# Patient Record
Sex: Female | Born: 1955
Health system: Southern US, Community
[De-identification: ages and names within clinical notes are randomized; demographics above are authoritative.]

## PROBLEM LIST (undated history)

## (undated) DIAGNOSIS — T7840XA Allergy, unspecified, initial encounter: Secondary | ICD-10-CM

## (undated) DIAGNOSIS — Z8489 Family history of other specified conditions: Secondary | ICD-10-CM

## (undated) DIAGNOSIS — C8330 Diffuse large B-cell lymphoma, unspecified site: Secondary | ICD-10-CM

## (undated) DIAGNOSIS — Z86018 Personal history of other benign neoplasm: Secondary | ICD-10-CM

## (undated) DIAGNOSIS — M1990 Unspecified osteoarthritis, unspecified site: Secondary | ICD-10-CM

## (undated) DIAGNOSIS — Z9221 Personal history of antineoplastic chemotherapy: Secondary | ICD-10-CM

## (undated) DIAGNOSIS — Z1371 Encounter for nonprocreative screening for genetic disease carrier status: Secondary | ICD-10-CM

## (undated) DIAGNOSIS — Z85828 Personal history of other malignant neoplasm of skin: Secondary | ICD-10-CM

## (undated) DIAGNOSIS — C50919 Malignant neoplasm of unspecified site of unspecified female breast: Secondary | ICD-10-CM

## (undated) DIAGNOSIS — C4490 Unspecified malignant neoplasm of skin, unspecified: Secondary | ICD-10-CM

## (undated) DIAGNOSIS — Z923 Personal history of irradiation: Secondary | ICD-10-CM

## (undated) DIAGNOSIS — R011 Cardiac murmur, unspecified: Secondary | ICD-10-CM

## (undated) DIAGNOSIS — IMO0002 Reserved for concepts with insufficient information to code with codable children: Secondary | ICD-10-CM

## (undated) DIAGNOSIS — R112 Nausea with vomiting, unspecified: Secondary | ICD-10-CM

## (undated) DIAGNOSIS — Z9889 Other specified postprocedural states: Secondary | ICD-10-CM

## (undated) HISTORY — PX: APPENDECTOMY: SHX54

## (undated) HISTORY — DX: Personal history of other malignant neoplasm of skin: Z85.828

## (undated) HISTORY — DX: Personal history of other benign neoplasm: Z86.018

## (undated) HISTORY — PX: JOINT REPLACEMENT: SHX530

## (undated) HISTORY — DX: Diffuse large B-cell lymphoma, unspecified site: C83.30

## (undated) HISTORY — DX: Encounter for nonprocreative screening for genetic disease carrier status: Z13.71

## (undated) HISTORY — DX: Allergy, unspecified, initial encounter: T78.40XA

---
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
## 1982-12-30 HISTORY — PX: CHOLECYSTECTOMY: SHX55

## 2000-12-30 HISTORY — PX: BREAST CYST ASPIRATION: SHX578

## 2002-12-30 HISTORY — PX: DILATION AND CURETTAGE OF UTERUS: SHX78

## 2003-12-31 HISTORY — PX: ENDOMETRIAL ABLATION: SHX621

## 2005-11-13 ENCOUNTER — Ambulatory Visit: Payer: Self-pay

## 2006-04-24 ENCOUNTER — Ambulatory Visit: Payer: Self-pay | Admitting: Family Medicine

## 2007-01-07 ENCOUNTER — Ambulatory Visit: Payer: Self-pay

## 2007-09-09 ENCOUNTER — Ambulatory Visit: Payer: Self-pay | Admitting: Family Medicine

## 2007-10-06 ENCOUNTER — Ambulatory Visit: Payer: Self-pay | Admitting: Gastroenterology

## 2007-10-06 LAB — HM COLONOSCOPY: HM Colonoscopy: NORMAL

## 2007-12-31 DIAGNOSIS — C50919 Malignant neoplasm of unspecified site of unspecified female breast: Secondary | ICD-10-CM

## 2007-12-31 DIAGNOSIS — Z923 Personal history of irradiation: Secondary | ICD-10-CM

## 2007-12-31 DIAGNOSIS — IMO0001 Reserved for inherently not codable concepts without codable children: Secondary | ICD-10-CM

## 2007-12-31 HISTORY — DX: Reserved for inherently not codable concepts without codable children: IMO0001

## 2007-12-31 HISTORY — PX: BREAST LUMPECTOMY: SHX2

## 2007-12-31 HISTORY — DX: Personal history of irradiation: Z92.3

## 2007-12-31 HISTORY — PX: BREAST EXCISIONAL BIOPSY: SUR124

## 2007-12-31 HISTORY — DX: Malignant neoplasm of unspecified site of unspecified female breast: C50.919

## 2008-05-03 ENCOUNTER — Ambulatory Visit: Payer: Self-pay

## 2008-05-05 ENCOUNTER — Ambulatory Visit: Payer: Self-pay

## 2008-05-30 ENCOUNTER — Ambulatory Visit: Payer: Self-pay | Admitting: Radiation Oncology

## 2008-05-31 ENCOUNTER — Ambulatory Visit: Payer: Self-pay | Admitting: General Surgery

## 2008-06-06 ENCOUNTER — Ambulatory Visit: Payer: Self-pay | Admitting: General Surgery

## 2008-06-16 ENCOUNTER — Ambulatory Visit: Payer: Self-pay | Admitting: Radiation Oncology

## 2008-06-29 ENCOUNTER — Ambulatory Visit: Payer: Self-pay | Admitting: Radiation Oncology

## 2008-06-29 HISTORY — PX: BREAST SURGERY: SHX581

## 2008-07-30 ENCOUNTER — Ambulatory Visit: Payer: Self-pay | Admitting: Radiation Oncology

## 2008-08-30 ENCOUNTER — Ambulatory Visit: Payer: Self-pay | Admitting: Radiation Oncology

## 2008-10-30 ENCOUNTER — Ambulatory Visit: Payer: Self-pay | Admitting: Internal Medicine

## 2008-11-11 ENCOUNTER — Ambulatory Visit: Payer: Self-pay | Admitting: Internal Medicine

## 2008-11-29 ENCOUNTER — Ambulatory Visit: Payer: Self-pay | Admitting: Internal Medicine

## 2008-12-05 ENCOUNTER — Ambulatory Visit: Payer: Self-pay | Admitting: General Surgery

## 2009-01-30 ENCOUNTER — Ambulatory Visit: Payer: Self-pay | Admitting: Internal Medicine

## 2009-02-03 ENCOUNTER — Ambulatory Visit: Payer: Self-pay | Admitting: Internal Medicine

## 2009-02-27 ENCOUNTER — Ambulatory Visit: Payer: Self-pay | Admitting: Internal Medicine

## 2009-04-04 DIAGNOSIS — K589 Irritable bowel syndrome without diarrhea: Secondary | ICD-10-CM | POA: Insufficient documentation

## 2009-04-04 DIAGNOSIS — E785 Hyperlipidemia, unspecified: Secondary | ICD-10-CM | POA: Insufficient documentation

## 2009-04-05 DIAGNOSIS — Z853 Personal history of malignant neoplasm of breast: Secondary | ICD-10-CM | POA: Insufficient documentation

## 2009-04-05 DIAGNOSIS — Z8249 Family history of ischemic heart disease and other diseases of the circulatory system: Secondary | ICD-10-CM | POA: Insufficient documentation

## 2009-04-28 ENCOUNTER — Ambulatory Visit: Payer: Self-pay | Admitting: Internal Medicine

## 2009-04-29 ENCOUNTER — Ambulatory Visit: Payer: Self-pay | Admitting: Internal Medicine

## 2009-05-18 DIAGNOSIS — E559 Vitamin D deficiency, unspecified: Secondary | ICD-10-CM | POA: Insufficient documentation

## 2009-06-12 ENCOUNTER — Ambulatory Visit: Payer: Self-pay | Admitting: General Surgery

## 2009-06-22 DIAGNOSIS — E781 Pure hyperglyceridemia: Secondary | ICD-10-CM | POA: Insufficient documentation

## 2009-06-29 ENCOUNTER — Ambulatory Visit: Payer: Self-pay | Admitting: Internal Medicine

## 2009-07-21 ENCOUNTER — Ambulatory Visit: Payer: Self-pay | Admitting: Internal Medicine

## 2009-07-30 ENCOUNTER — Ambulatory Visit: Payer: Self-pay | Admitting: Internal Medicine

## 2009-09-29 ENCOUNTER — Ambulatory Visit: Payer: Self-pay | Admitting: Internal Medicine

## 2009-10-13 ENCOUNTER — Ambulatory Visit: Payer: Self-pay | Admitting: Internal Medicine

## 2009-10-30 ENCOUNTER — Ambulatory Visit: Payer: Self-pay | Admitting: Internal Medicine

## 2009-11-29 ENCOUNTER — Ambulatory Visit: Payer: Self-pay | Admitting: General Surgery

## 2010-01-30 ENCOUNTER — Ambulatory Visit: Payer: Self-pay | Admitting: Internal Medicine

## 2010-02-02 ENCOUNTER — Ambulatory Visit: Payer: Self-pay | Admitting: Internal Medicine

## 2010-02-27 ENCOUNTER — Ambulatory Visit: Payer: Self-pay | Admitting: Internal Medicine

## 2010-03-30 ENCOUNTER — Ambulatory Visit: Payer: Self-pay | Admitting: Internal Medicine

## 2010-04-27 ENCOUNTER — Ambulatory Visit: Payer: Self-pay | Admitting: Internal Medicine

## 2010-04-29 ENCOUNTER — Ambulatory Visit: Payer: Self-pay | Admitting: Internal Medicine

## 2010-06-18 ENCOUNTER — Ambulatory Visit: Payer: Self-pay | Admitting: General Surgery

## 2010-07-25 ENCOUNTER — Ambulatory Visit: Payer: Self-pay | Admitting: Internal Medicine

## 2010-07-30 ENCOUNTER — Ambulatory Visit: Payer: Self-pay | Admitting: Internal Medicine

## 2010-08-30 ENCOUNTER — Ambulatory Visit: Payer: Self-pay | Admitting: Internal Medicine

## 2010-12-19 ENCOUNTER — Ambulatory Visit: Payer: Self-pay | Admitting: General Surgery

## 2010-12-19 ENCOUNTER — Ambulatory Visit: Payer: Self-pay | Admitting: Internal Medicine

## 2010-12-30 ENCOUNTER — Ambulatory Visit: Payer: Self-pay | Admitting: Internal Medicine

## 2011-02-11 DIAGNOSIS — Z85828 Personal history of other malignant neoplasm of skin: Secondary | ICD-10-CM

## 2011-02-11 HISTORY — DX: Personal history of other malignant neoplasm of skin: Z85.828

## 2011-06-20 DIAGNOSIS — Z86018 Personal history of other benign neoplasm: Secondary | ICD-10-CM

## 2011-06-20 HISTORY — DX: Personal history of other benign neoplasm: Z86.018

## 2011-06-21 ENCOUNTER — Ambulatory Visit: Payer: Self-pay | Admitting: Internal Medicine

## 2011-06-30 ENCOUNTER — Ambulatory Visit: Payer: Self-pay | Admitting: Internal Medicine

## 2011-08-07 ENCOUNTER — Ambulatory Visit: Payer: Self-pay | Admitting: Internal Medicine

## 2011-09-10 ENCOUNTER — Ambulatory Visit: Payer: Self-pay | Admitting: Family Medicine

## 2012-08-20 ENCOUNTER — Ambulatory Visit: Payer: Self-pay | Admitting: Internal Medicine

## 2012-08-21 ENCOUNTER — Ambulatory Visit: Payer: Self-pay | Admitting: Internal Medicine

## 2012-08-21 LAB — CREATININE, SERUM
EGFR (African American): >60
EGFR (Non-African Amer.): >60

## 2012-08-21 LAB — CBC CANCER CENTER
Basophil #: 0 x10 3/mm (ref 0.0–0.1)
Basophil %: 0.4 %
HCT: 42.3 % (ref 35.0–47.0)
HGB: 14.2 g/dL (ref 12.0–16.0)
MCH: 29.4 pg (ref 26.0–34.0)
MCHC: 33.6 g/dL (ref 32.0–36.0)
MCV: 88 fL (ref 80–100)
Monocyte #: 0.5 x10 3/mm (ref 0.2–0.9)
Neutrophil #: 3.7 x10 3/mm (ref 1.4–6.5)
RDW: 13.2 % (ref 11.5–14.5)

## 2012-08-21 LAB — HEPATIC FUNCTION PANEL A (ARMC)
Albumin: 3.7 g/dL (ref 3.4–5.0)
Alkaline Phosphatase: 106 U/L (ref 50–136)
Bilirubin,Total: 0.6 mg/dL (ref 0.2–1.0)
SGOT(AST): 20 U/L (ref 15–37)
SGPT (ALT): 26 U/L (ref 12–78)

## 2012-08-30 ENCOUNTER — Ambulatory Visit: Payer: Self-pay | Admitting: Internal Medicine

## 2012-12-11 LAB — HM PAP SMEAR: HM Pap smear: NEGATIVE

## 2013-02-18 ENCOUNTER — Ambulatory Visit: Payer: Self-pay | Admitting: Internal Medicine

## 2013-08-18 LAB — HM DEXA SCAN

## 2013-08-23 ENCOUNTER — Ambulatory Visit: Payer: Self-pay | Admitting: Internal Medicine

## 2013-09-06 ENCOUNTER — Ambulatory Visit: Payer: Self-pay | Admitting: Internal Medicine

## 2013-09-06 LAB — CBC CANCER CENTER
Basophil %: 0.7 %
Eosinophil %: 1.2 %
HCT: 43.5 % (ref 35.0–47.0)
Lymphocyte %: 28.9 %
MCH: 29.8 pg (ref 26.0–34.0)
MCHC: 34.6 g/dL (ref 32.0–36.0)
MCV: 86 fL (ref 80–100)
Monocyte #: 0.6 x10 3/mm (ref 0.2–0.9)
Neutrophil #: 5 x10 3/mm (ref 1.4–6.5)
Neutrophil %: 61.4 %
Platelet: 196 x10 3/mm (ref 150–440)
RBC: 5.05 10*6/uL (ref 3.80–5.20)

## 2013-09-06 LAB — HEPATIC FUNCTION PANEL A (ARMC)
Albumin: 3.9 g/dL (ref 3.4–5.0)
Alkaline Phosphatase: 137 U/L — ABNORMAL HIGH (ref 50–136)
Bilirubin,Total: 0.3 mg/dL (ref 0.2–1.0)
SGOT(AST): 21 U/L (ref 15–37)

## 2013-09-06 LAB — CREATININE, SERUM
Creatinine: 0.8 mg/dL (ref 0.60–1.30)
EGFR (African American): >60
EGFR (Non-African Amer.): >60

## 2013-09-29 ENCOUNTER — Ambulatory Visit: Payer: Self-pay | Admitting: Internal Medicine

## 2013-12-30 HISTORY — PX: TUMOR REMOVAL: SHX12

## 2014-01-20 ENCOUNTER — Ambulatory Visit: Payer: Self-pay | Admitting: Obstetrics and Gynecology

## 2014-01-28 ENCOUNTER — Ambulatory Visit: Payer: Self-pay | Admitting: Obstetrics and Gynecology

## 2014-02-01 ENCOUNTER — Ambulatory Visit: Payer: Self-pay | Admitting: Family Medicine

## 2014-02-02 LAB — PATHOLOGY REPORT

## 2014-03-01 ENCOUNTER — Ambulatory Visit: Payer: Self-pay | Admitting: Obstetrics and Gynecology

## 2014-12-14 ENCOUNTER — Ambulatory Visit: Payer: Self-pay | Admitting: Family Medicine

## 2014-12-14 LAB — HM MAMMOGRAPHY: HM Mammogram: NEGATIVE

## 2015-01-11 LAB — HEMOGLOBIN A1C: HEMOGLOBIN A1C: 5.4

## 2015-01-11 LAB — HEPATIC FUNCTION PANEL
ALT: 21 U/L (ref 7–35)
AST: 15 U/L (ref 13–35)

## 2015-01-11 LAB — BASIC METABOLIC PANEL
BUN: 8 mg/dL (ref 4–21)
Creatinine: 0.7 mg/dL (ref 0.5–1.1)
GLUCOSE: 96 mg/dL
Potassium: 4.6 mmol/L (ref 3.4–5.3)
SODIUM: 141 mmol/L (ref 137–147)

## 2015-01-11 LAB — LIPID PANEL
Cholesterol: 207 mg/dL — AB (ref 0–200)
HDL: 45 mg/dL (ref 35–70)
LDL CALC: 116 mg/dL
Triglycerides: 228 mg/dL — AB (ref 40–160)

## 2015-01-11 LAB — CBC AND DIFFERENTIAL
HCT: 43 % (ref 36–46)
Hemoglobin: 14.5 g/dL (ref 12.0–16.0)
Platelets: 212 10*3/uL (ref 150–399)
WBC: 6.8 10*3/mL

## 2015-01-11 LAB — TSH: TSH: 4.64 u[IU]/mL (ref 0.41–5.90)

## 2015-04-22 NOTE — Op Note (Signed)
PATIENT NAME:  Victoria Benson, Victoria Benson MR#:  [id number] DATE OF BIRTH:  20-Oct-1956  DATE OF PROCEDURE:  01/28/2014  PREOPERATIVE DIAGNOSIS: Intracavitary defect on saline infusion sonogram and postmenopausal bleeding.   POSTOPERATIVE DIAGNOSIS: Submucosal myoma.   OPERATION PERFORMED: Operative hysteroscopy.   PRIMARY SURGEON: Malachy Mood, MD  ANESTHESIA: General.   ESTIMATED BLOOD LOSS: 10 mL.  OPERATIVE FLUIDS: One liter of crystalloid, 10 mL of urine output.   PREOPERATIVE ANTIBIOTICS: None.   COMPLICATIONS: None.   FINDINGS: A 2 to 3 cm submucosal pedunculated myoma, cavity consistent with a unicornuate uterus.   SPECIMENS REMOVED: Submucosal myoma morcellated.  CONDITION FOLLOWING PROCEDURE: Stable.   PROCEDURE IN DETAIL: Risks, benefits, and alternatives of the procedure were discussed with the patient prior to proceeding to the operating room. The patient was taken to the operating room where she was placed under general endotracheal anesthesia. She was positioned in the dorsal lithotomy position using Allen stirrups, prepped and draped in the usual sterile fashion. A timeout procedure was performed. Attention was turned to the patient's pelvis. A red rubber catheter was used to straight cath the bladder. An operative speculum was then placed. The cervix was visualized, grasped with a single-tooth tenaculum and sequentially dilated using Pratt dilators. The hysteroscope was advanced into the cavity noting the above findings. A MyoSure device was then used to remove the submucosal myoma. Following resection of the submucosal myoma, the resection site was noted to be hemostatic. There were no other defects noted in the cavity. The hysteroscope was removed. The single-tooth tenaculum was removed as well, and the tenaculum sites were noted to be hemostatic. Sponge, needle, and instrument counts were correct x2. The patient tolerated the procedure well and was taken to the recovery room in  stable condition. ____________________________ Stoney Bang. Georgianne Fick, MD ams:sb D: 02/01/2014 07:27:23 ET T: 02/01/2014 09:43:06 ET JOB#: [id number]  cc: Stoney Bang. Georgianne Fick, MD, <Dictator> Conan Bowens Madelon Lips MD ELECTRONICALLY SIGNED 02/16/2014 0:59

## 2015-12-31 HISTORY — PX: BREAST LUMPECTOMY: SHX2

## 2016-01-04 DIAGNOSIS — J309 Allergic rhinitis, unspecified: Secondary | ICD-10-CM | POA: Insufficient documentation

## 2016-01-04 DIAGNOSIS — I83813 Varicose veins of bilateral lower extremities with pain: Secondary | ICD-10-CM | POA: Insufficient documentation

## 2016-01-04 DIAGNOSIS — R319 Hematuria, unspecified: Secondary | ICD-10-CM | POA: Insufficient documentation

## 2016-01-08 ENCOUNTER — Encounter: Payer: Self-pay | Admitting: Family Medicine

## 2016-03-20 ENCOUNTER — Ambulatory Visit (INDEPENDENT_AMBULATORY_CARE_PROVIDER_SITE_OTHER): Payer: 59 | Admitting: Family Medicine

## 2016-03-20 ENCOUNTER — Encounter: Payer: Self-pay | Admitting: Family Medicine

## 2016-03-20 VITALS — BP 110/62 | HR 76 | Temp 98.1°F | Resp 16 | Ht 62.0 in | Wt 211.0 lb

## 2016-03-20 DIAGNOSIS — Z1211 Encounter for screening for malignant neoplasm of colon: Secondary | ICD-10-CM | POA: Diagnosis not present

## 2016-03-20 DIAGNOSIS — E785 Hyperlipidemia, unspecified: Secondary | ICD-10-CM

## 2016-03-20 DIAGNOSIS — J3089 Other allergic rhinitis: Secondary | ICD-10-CM | POA: Diagnosis not present

## 2016-03-20 DIAGNOSIS — E039 Hypothyroidism, unspecified: Secondary | ICD-10-CM

## 2016-03-20 DIAGNOSIS — Z1239 Encounter for other screening for malignant neoplasm of breast: Secondary | ICD-10-CM

## 2016-03-20 DIAGNOSIS — E038 Other specified hypothyroidism: Secondary | ICD-10-CM

## 2016-03-20 DIAGNOSIS — Z0000 Encounter for general adult medical examination without abnormal findings: Secondary | ICD-10-CM | POA: Diagnosis not present

## 2016-03-20 LAB — POCT URINALYSIS DIPSTICK
BILIRUBIN UA: NEGATIVE
Blood, UA: NEGATIVE
GLUCOSE UA: NEGATIVE
Ketones, UA: NEGATIVE
LEUKOCYTES UA: NEGATIVE
NITRITE UA: NEGATIVE
Protein, UA: NEGATIVE
Spec Grav, UA: <=1.005
Urobilinogen, UA: 0.2
pH, UA: 6.5

## 2016-03-20 LAB — IFOBT (OCCULT BLOOD): IMMUNOLOGICAL FECAL OCCULT BLOOD TEST: NEGATIVE

## 2016-03-20 NOTE — Patient Instructions (Signed)
Please call the [facility] at [facility] to schedule this at [phone]   

## 2016-03-20 NOTE — Progress Notes (Signed)
Patient ID: Victoria Benson, female   DOB: 1956/08/20, 60 y.o.   MRN: [id number]       Patient: Victoria Benson, Female    DOB: 06/16/1956, 60 y.o.   MRN: [id number] Visit Date: 03/20/2016  Today's Provider: Margarita Rana, MD   Chief Complaint  Patient presents with  . Annual Exam   Subjective:    Annual wellness visit Victoria Benson is a 60 y.o. female. She feels well. She reports exercising none. She reports she is sleeping fairly well. 01/05/15 CPE 12/11/12 Pap-neg; HPV-neg, pt reports a pap done at [facility] in 2014 12/14/14 Mammogram-BI-RADS 2 10/06/07 Colonoscopy-normal, recheck 10 yrs -----------------------------------------------------------    Review of Systems  Constitutional: Negative.   HENT: Positive for postnasal drip, sinus pressure and tinnitus.   Eyes: Negative.   Respiratory: Negative.   Cardiovascular: Negative.   Gastrointestinal: Negative.   Endocrine: Negative.   Genitourinary: Negative.   Musculoskeletal: Negative.   Skin: Negative.   Allergic/Immunologic: Negative.   Neurological: Negative.   Hematological: Negative.   Psychiatric/Behavioral: Negative.     Social History   Social History  . Marital Status: Married    Spouse Name: N/A  . Number of Children: N/A  . Years of Education: N/A   Occupational History  . Not on file.   Social History Main Topics  . Smoking status: Never Smoker   . Smokeless tobacco: Never Used  . Alcohol Use: No  . Drug Use: No  . Sexual Activity: Not on file   Other Topics Concern  . Not on file   Social History Narrative    History reviewed. No pertinent past medical history.   Patient Active Problem List   Diagnosis Date Noted  . Subclinical hypothyroidism 03/20/2016  . Allergic rhinitis 01/04/2016  . Blood in the urine 01/04/2016  . Phlebectasia 01/04/2016  . Hyperglyceridemia, pure 06/22/2009  . Avitaminosis D 05/18/2009  . Fam hx-ischem heart disease 04/05/2009  . H/O malignant  neoplasm of breast 04/05/2009  . HLD (hyperlipidemia) 04/04/2009  . Adaptive colitis 04/04/2009    Past Surgical History  Procedure Laterality Date  . Cholecystectomy  1984  . Endometrial ablation  2005  . Breast surgery Right July 2009    lumpectomy- CA  . Dilation and curettage of uterus  2004  . Tumor removal  12/2013    from uterus done by [facility]    Her family history includes CAD in her mother; Diabetes in her father and paternal grandfather; Fibromyalgia in her brother; Healthy in her brother; Heart attack in her paternal grandmother; Hypertension in her brother, brother, and brother; Lung cancer in her father; Osteoporosis in her mother; Ovarian cancer in her maternal grandmother.    Previous Medications   CALCIUM-MAGNESIUM-VITAMIN D ER PO    Take 1 tablet by mouth daily.   LORATADINE (CLARITIN) 10 MG TABLET    Take 10 mg by mouth daily.    OMEGA-3 KRILL OIL 500 MG CAPS    Take 750 mg by mouth daily.     Patient Care Team: Margarita Rana, MD as PCP - General (Family Medicine)     Objective:   Vitals: BP 110/62 mmHg  Pulse 76  Temp(Src) 98.1 F (36.7 C) (Oral)  Resp 16  Ht 5\' 2"  (1.575 m)  Wt 211 lb (95.709 kg)  BMI 38.58 kg/m2  Physical Exam  Constitutional: She is oriented to person, place, and time. She appears well-developed and well-nourished.  HENT:  Head: Normocephalic and atraumatic.  Right  Ear: Tympanic membrane, external ear and ear canal normal.  Left Ear: Tympanic membrane, external ear and ear canal normal.  Nose: Nose normal.  Mouth/Throat: Uvula is midline, oropharynx is clear and moist and mucous membranes are normal.  Eyes: Conjunctivae, EOM and lids are normal. Pupils are equal, round, and reactive to light.  Neck: Trachea normal and normal range of motion. Neck supple. Carotid bruit is not present. No thyroid mass and no thyromegaly present.  Cardiovascular: Normal rate, regular rhythm and normal heart sounds.   Pulmonary/Chest: Effort  normal and breath sounds normal.  Post surgical scar on right breast  Abdominal: Soft. Normal appearance and bowel sounds are normal. There is no hepatosplenomegaly. There is no tenderness.  Genitourinary: Uterus normal. No breast swelling, tenderness or discharge.  Musculoskeletal: Normal range of motion.  Lymphadenopathy:    She has no cervical adenopathy.    She has no axillary adenopathy.  Neurological: She is alert and oriented to person, place, and time. She has normal strength. No cranial nerve deficit.  Skin: Skin is warm, dry and intact.  Psychiatric: She has a normal mood and affect. Her speech is normal and behavior is normal. Judgment and thought content normal. Cognition and memory are normal.    Activities of Daily Living In your present state of health, do you have any difficulty performing the following activities: 03/20/2016  Hearing? Y  Vision? N  Difficulty concentrating or making decisions? N  Walking or climbing stairs? N  Dressing or bathing? N  Doing errands, shopping? N    Fall Risk Assessment Fall Risk  03/20/2016  Falls in the past year? No     Depression Screen PHQ 2/9 Scores 03/20/2016  PHQ - 2 Score 0     Assessment & Plan:     Annual Wellness Visit  Reviewed patient's Family Medical History Reviewed and updated list of patient's medical providers Assessment of cognitive impairment was done Assessed patient's functional ability Established a written schedule for health screening [place] Completed and Reviewed  Exercise Activities and Dietary recommendations Goals    None      Immunization History  Administered Date(s) Administered  . Influenza-Unspecified 12/04/2015  . Td 07/10/1999  . Tdap 04/12/2009       1. Annual physical exam Stable. Patient advised to continue eating healthy and exercise daily. - POCT urinalysis dipstick  2. Breast cancer screening - MM DIGITAL SCREENING BILATERAL; Future  3.  Colon cancer screening - IFOBT POC (occult bld, rslt in office)  4. HLD (hyperlipidemia) - Comprehensive metabolic panel - Lipid Panel With LDL/HDL Ratio  5. Other allergic rhinitis - CBC with Differential/Platelet  6. Subclinical hypothyroidism - TSH   Patient seen and examined by Dr. Jerrell Belfast, and note scribed by Philbert Riser. Dimas, CMA.   I have reviewed the document for accuracy and completeness and I agree with above. Jerrell Belfast, MD   Margarita Rana, MD   ------------------------------------------------------------------------------------------------------------

## 2016-03-28 ENCOUNTER — Telehealth: Payer: Self-pay

## 2016-03-28 LAB — LIPID PANEL WITH LDL/HDL RATIO
CHOLESTEROL TOTAL: 221 mg/dL — AB (ref 100–199)
HDL: 45 mg/dL (ref >39)
LDL Calculated: 124 mg/dL — ABNORMAL HIGH (ref 0–99)
LDl/HDL Ratio: 2.8 ratio units (ref 0.0–3.2)
Triglycerides: 262 mg/dL — ABNORMAL HIGH (ref 0–149)
VLDL Cholesterol Cal: 52 mg/dL — ABNORMAL HIGH (ref 5–40)

## 2016-03-28 LAB — COMPREHENSIVE METABOLIC PANEL
A/G RATIO: 1.9 (ref 1.2–2.2)
ALBUMIN: 4.4 g/dL (ref 3.5–5.5)
ALK PHOS: 102 IU/L (ref 39–117)
ALT: 23 IU/L (ref 0–32)
AST: 17 IU/L (ref 0–40)
BILIRUBIN TOTAL: 0.6 mg/dL (ref 0.0–1.2)
BUN / CREAT RATIO: 13 (ref 9–23)
BUN: 9 mg/dL (ref 6–24)
CHLORIDE: 103 mmol/L (ref 96–106)
CO2: 28 mmol/L (ref 18–29)
Calcium: 9.3 mg/dL (ref 8.7–10.2)
Creatinine, Ser: 0.68 mg/dL (ref 0.57–1.00)
GFR calc non Af Amer: 97 mL/min/{1.73_m2} (ref >59)
GFR, EST AFRICAN AMERICAN: 112 mL/min/{1.73_m2} (ref >59)
GLUCOSE: 93 mg/dL (ref 65–99)
Globulin, Total: 2.3 g/dL (ref 1.5–4.5)
POTASSIUM: 4.5 mmol/L (ref 3.5–5.2)
SODIUM: 145 mmol/L — AB (ref 134–144)
TOTAL PROTEIN: 6.7 g/dL (ref 6.0–8.5)

## 2016-03-28 LAB — CBC WITH DIFFERENTIAL/PLATELET
BASOS ABS: 0 10*3/uL (ref 0.0–0.2)
Basos: 0 %
EOS (ABSOLUTE): 0.1 10*3/uL (ref 0.0–0.4)
Eos: 2 %
Hematocrit: 42.5 % (ref 34.0–46.6)
Hemoglobin: 14 g/dL (ref 11.1–15.9)
IMMATURE GRANS (ABS): 0 10*3/uL (ref 0.0–0.1)
IMMATURE GRANULOCYTES: 0 %
LYMPHS: 30 %
Lymphocytes Absolute: 2.1 10*3/uL (ref 0.7–3.1)
MCH: 28.8 pg (ref 26.6–33.0)
MCHC: 32.9 g/dL (ref 31.5–35.7)
MCV: 87 fL (ref 79–97)
Monocytes Absolute: 0.4 10*3/uL (ref 0.1–0.9)
Monocytes: 6 %
NEUTROS ABS: 4.3 10*3/uL (ref 1.4–7.0)
NEUTROS PCT: 62 %
PLATELETS: 245 10*3/uL (ref 150–379)
RBC: 4.86 x10E6/uL (ref 3.77–5.28)
RDW: 13.8 % (ref 12.3–15.4)
WBC: 7 10*3/uL (ref 3.4–10.8)

## 2016-03-28 LAB — TSH: TSH: 3.43 u[IU]/mL (ref 0.450–4.500)

## 2016-03-28 NOTE — Telephone Encounter (Signed)
-----   Message from Margarita Rana, MD sent at 03/28/2016 11:04 AM EDT ----- Labs stable. Cholesterol is elevated at 221 but does have high good cholesterol and 10 year risk of heart disease is only 3 percent.  Recommend eat healthy and exercise and recheck annually. Thanks.

## 2016-03-28 NOTE — Telephone Encounter (Signed)
Advised pt of lab results. Pt verbally acknowledges understanding. Estephany Perot Drozdowski, CMA   

## 2016-05-08 ENCOUNTER — Encounter: Payer: Self-pay | Admitting: Physician Assistant

## 2016-05-08 ENCOUNTER — Ambulatory Visit (INDEPENDENT_AMBULATORY_CARE_PROVIDER_SITE_OTHER): Payer: 59 | Admitting: Physician Assistant

## 2016-05-08 VITALS — BP 122/68 | HR 117 | Temp 99.9°F | Resp 16 | Wt 204.6 lb

## 2016-05-08 DIAGNOSIS — J029 Acute pharyngitis, unspecified: Secondary | ICD-10-CM

## 2016-05-08 DIAGNOSIS — J020 Streptococcal pharyngitis: Secondary | ICD-10-CM

## 2016-05-08 LAB — POCT RAPID STREP A (OFFICE): RAPID STREP A SCREEN: POSITIVE — AB

## 2016-05-08 MED ORDER — AMOXICILLIN 875 MG PO TABS: 875.0000 mg | ORAL_TABLET | Freq: Two times a day (BID) | ORAL | Status: DC

## 2016-05-08 NOTE — Patient Instructions (Signed)

## 2016-05-08 NOTE — Progress Notes (Signed)
Patient: Victoria Benson Female    DOB: 1956/10/20   60 y.o.   MRN: [id number] Visit Date: 05/08/2016  Today's Provider: Mar Daring, PA-C   Chief Complaint  Patient presents with  . Sore Throat   Subjective:    Sore Throat  This is a new problem. The current episode started in the past 7 days (Sunday). The problem has been gradually worsening. Sore throat worse side: both sides. The maximum temperature recorded prior to her arrival was 101 - 101.9 F (101 yesterday). Associated symptoms include congestion, headaches, a plugged ear sensation and trouble swallowing. Pertinent negatives include no abdominal pain, coughing, ear pain, hoarse voice, shortness of breath, swollen glands or vomiting. She has had exposure to strep. Exposure to: 2 grandchildren have strep. Treatments tried: Sudafed. The treatment provided no relief.      Allergies  Allergen Reactions  . Trilipix  [Choline Fenofibrate]     severe indigestion   Previous Medications   CALCIUM-MAGNESIUM-VITAMIN D ER PO    Take 1 tablet by mouth daily.   LORATADINE (CLARITIN) 10 MG TABLET    Take 10 mg by mouth daily.    OMEGA-3 KRILL OIL 500 MG CAPS    Take 750 mg by mouth daily.     Review of Systems  Constitutional: Positive for fever and fatigue. Negative for chills.  HENT: Positive for congestion, postnasal drip, sinus pressure, sneezing, sore throat and trouble swallowing. Negative for ear pain and hoarse voice.   Respiratory: Negative for cough, chest tightness, shortness of breath and wheezing.   Cardiovascular: Negative for chest pain, palpitations and leg swelling.  Gastrointestinal: Negative for nausea, vomiting and abdominal pain.  Neurological: Positive for headaches. Negative for dizziness.    Social History  Substance Use Topics  . Smoking status: Never Smoker   . Smokeless tobacco: Never Used  . Alcohol Use: No   Objective:   BP 122/68 mmHg  Pulse 117  Temp(Src) 99.9 F (37.7 C) (Oral)   Resp 16  Wt 204 lb 9.6 oz (92.806 kg)  SpO2 98%  Physical Exam  Constitutional: She appears well-developed and well-nourished. No distress.  HENT:  Head: Normocephalic and atraumatic.  Right Ear: Hearing, tympanic membrane, external ear and ear canal normal.  Left Ear: Hearing, tympanic membrane, external ear and ear canal normal.  Nose: Mucosal edema present. No rhinorrhea. Right sinus exhibits maxillary sinus tenderness. Right sinus exhibits no frontal sinus tenderness. Left sinus exhibits maxillary sinus tenderness. Left sinus exhibits no frontal sinus tenderness.  Mouth/Throat: Uvula is midline and mucous membranes are normal. Oropharyngeal exudate, posterior oropharyngeal edema and posterior oropharyngeal erythema present.  Eyes: Conjunctivae are normal. Pupils are equal, round, and reactive to light. Right eye exhibits no discharge. Left eye exhibits no discharge. No scleral icterus.  Neck: Normal range of motion. Neck supple. No tracheal deviation present. No thyromegaly present.  Cardiovascular: Normal rate, regular rhythm and normal heart sounds.  Exam reveals no gallop and no friction rub.   No murmur heard. Pulmonary/Chest: Effort normal and breath sounds normal. No stridor. No respiratory distress. She has no wheezes. She has no rales.  Lymphadenopathy:    She has cervical adenopathy.  Skin: Skin is warm and dry. She is not diaphoretic.  Vitals reviewed.       Assessment & Plan:     1. Sore throat Strep positive. - POCT rapid strep A  2. Strep throat Will treat with amoxicillin as below. Continue IBU and/or  tylenol as needed for fever and body aches. Salt water gargles and chloraseptic spray for symptomatic relief. Call if symptoms fail to improve.  - amoxicillin (AMOXIL) 875 MG tablet; Take 1 tablet (875 mg total) by mouth 2 (two) times daily.  Dispense: 20 tablet; Refill: 0       Mar Daring, PA-C  [facility]

## 2016-05-14 ENCOUNTER — Ambulatory Visit (INDEPENDENT_AMBULATORY_CARE_PROVIDER_SITE_OTHER): Payer: 59 | Admitting: Family Medicine

## 2016-05-14 ENCOUNTER — Encounter: Payer: Self-pay | Admitting: Family Medicine

## 2016-05-14 VITALS — BP 102/64 | HR 84 | Temp 98.6°F | Resp 16 | Wt 203.0 lb

## 2016-05-14 DIAGNOSIS — M25562 Pain in left knee: Secondary | ICD-10-CM | POA: Insufficient documentation

## 2016-05-14 NOTE — Progress Notes (Signed)
Subjective:    Patient ID: Victoria Benson, female    DOB: 01-23-1956, 60 y.o.   MRN: [id number]  Knee Pain  Incident onset: x 1 month. There was no injury mechanism. The pain is present in the left knee. The quality of the pain is described as shooting ("pulsating"). The pain is severe. The pain has been intermittent since onset. Pertinent negatives include no inability to bear weight, loss of motion, loss of sensation, muscle weakness, numbness or tingling. Exacerbated by: sitting still. Treatments tried: walking. The treatment provided moderate relief.      Review of Systems  Constitutional: Negative for fever, chills, diaphoresis, activity change, appetite change, fatigue and unexpected weight change.  Musculoskeletal: Positive for arthralgias.  Neurological: Negative for tingling and numbness.   BP 102/64 mmHg  Pulse 84  Temp(Src) 98.6 F (37 C) (Oral)  Resp 16  Wt 203 lb (92.08 kg)   Patient Active Problem List   Diagnosis Date Noted  . Subclinical hypothyroidism 03/20/2016  . Allergic rhinitis 01/04/2016  . Blood in the urine 01/04/2016  . Phlebectasia 01/04/2016  . Hyperglyceridemia, pure 06/22/2009  . Avitaminosis D 05/18/2009  . Fam hx-ischem heart disease 04/05/2009  . H/O malignant neoplasm of breast 04/05/2009  . HLD (hyperlipidemia) 04/04/2009  . Adaptive colitis 04/04/2009   No past medical history on file. Current Outpatient Prescriptions on File Prior to Visit  Medication Sig  . amoxicillin (AMOXIL) 875 MG tablet Take 1 tablet (875 mg total) by mouth 2 (two) times daily.  Marland Kitchen CALCIUM-MAGNESIUM-VITAMIN D ER PO Take 1 tablet by mouth daily.  Marland Kitchen loratadine (CLARITIN) 10 MG tablet Take 10 mg by mouth daily.   . Omega-3 Krill Oil 500 MG CAPS Take 750 mg by mouth daily.    No current facility-administered medications on file prior to visit.   Allergies  Allergen Reactions  . Trilipix  [Choline Fenofibrate]     severe indigestion   Past Surgical History    Procedure Laterality Date  . Cholecystectomy  1984  . Endometrial ablation  2005  . Breast surgery Right July 2009    lumpectomy- CA  . Dilation and curettage of uterus  2004  . Tumor removal  12/2013    from uterus done by [facility]   Social History   Social History  . Marital Status: Married    Spouse Name: N/A  . Number of Children: N/A  . Years of Education: N/A   Occupational History  . Not on file.   Social History Main Topics  . Smoking status: Never Smoker   . Smokeless tobacco: Never Used  . Alcohol Use: No  . Drug Use: No  . Sexual Activity: Not on file   Other Topics Concern  . Not on file   Social History Narrative   Family History  Problem Relation Age of Onset  . CAD Mother   . Osteoporosis Mother   . Lung cancer Father   . Diabetes Father   . Hypertension Brother   . Ovarian cancer Maternal Grandmother   . Heart attack Paternal Grandmother   . Diabetes Paternal Grandfather   . Fibromyalgia Brother   . Hypertension Brother   . Hypertension Brother   . Healthy Brother        Objective:   Physical Exam  Constitutional: She appears well-developed and well-nourished.  Musculoskeletal:       Left knee: She exhibits normal range of motion, no swelling, no deformity, normal alignment and normal meniscus. No  tenderness found.  Varicose veins present in left leg.  Psychiatric: She has a normal mood and affect. Her behavior is normal.      Assessment & Plan:  1. Knee pain, left New problem. Unusual presentation.  Unclear etiology. Believe this is secondary to nerve impingement. Will refer to ortho as below. - Ambulatory referral to Orthopedic Surgery   Patient seen and examined by Jerrell Belfast, MD, and note scribed by Renaldo Fiddler, CMA. I have reviewed the document for accuracy and completeness and I agree with above. Jerrell Belfast, MD   Margarita Rana, MD

## 2016-05-15 ENCOUNTER — Ambulatory Visit
Admission: RE | Admit: 2016-05-15 | Discharge: 2016-05-15 | Disposition: A | Discharge status: Home / Self Care | Payer: 59 | Source: Ambulatory Visit | Attending: Family Medicine | Admitting: Family Medicine

## 2016-05-15 ENCOUNTER — Ambulatory Visit (INDEPENDENT_AMBULATORY_CARE_PROVIDER_SITE_OTHER): Payer: 59 | Admitting: Family Medicine

## 2016-05-15 ENCOUNTER — Encounter: Payer: Self-pay | Admitting: Family Medicine

## 2016-05-15 VITALS — BP 110/60 | HR 88 | Temp 98.2°F | Resp 16 | Wt 202.0 lb

## 2016-05-15 DIAGNOSIS — S62616A Displaced fracture of proximal phalanx of right little finger, initial encounter for closed fracture: Secondary | ICD-10-CM | POA: Insufficient documentation

## 2016-05-15 DIAGNOSIS — S6991XA Unspecified injury of right wrist, hand and finger(s), initial encounter: Secondary | ICD-10-CM

## 2016-05-15 DIAGNOSIS — Z1231 Encounter for screening mammogram for malignant neoplasm of breast: Secondary | ICD-10-CM | POA: Insufficient documentation

## 2016-05-15 DIAGNOSIS — W19XXXA Unspecified fall, initial encounter: Secondary | ICD-10-CM | POA: Diagnosis not present

## 2016-05-15 DIAGNOSIS — Z1239 Encounter for other screening for malignant neoplasm of breast: Secondary | ICD-10-CM

## 2016-05-15 HISTORY — DX: Malignant neoplasm of unspecified site of unspecified female breast: C50.919

## 2016-05-15 HISTORY — DX: Unspecified malignant neoplasm of skin, unspecified: C44.90

## 2016-05-15 HISTORY — DX: Reserved for concepts with insufficient information to code with codable children: IMO0002

## 2016-05-15 NOTE — Progress Notes (Signed)
Subjective:    Patient ID: Victoria Benson, female    DOB: 05-17-1956, 60 y.o.   MRN: [id number]  Fall The accident occurred 12 to 24 hours ago. The fall occurred while walking (pt tripped on step while carrying groceries and talking on the phone). She landed on concrete. The volume of blood lost was minimal. The point of impact was the right knee, right wrist and left wrist. Pain location: right fifth digit. The pain is at a severity of 2/10. The pain is mild. Pertinent negatives include no abdominal pain, bowel incontinence, fever, headaches, hearing loss, hematuria, loss of consciousness, nausea, numbness, tingling, visual change or vomiting. She has tried ice for the symptoms. The treatment provided moderate relief.      Review of Systems  Constitutional: Negative for fever.  Gastrointestinal: Negative for nausea, vomiting, abdominal pain and bowel incontinence.  Genitourinary: Negative for hematuria.  Skin: Positive for wound.  Neurological: Negative for tingling, loss of consciousness, numbness and headaches.   BP 110/60 mmHg  Pulse 88  Temp(Src) 98.2 F (36.8 C) (Oral)  Resp 16  Wt 202 lb (91.627 kg)   Patient Active Problem List   Diagnosis Date Noted  . Knee pain, left 05/14/2016  . Subclinical hypothyroidism 03/20/2016  . Allergic rhinitis 01/04/2016  . Blood in the urine 01/04/2016  . Phlebectasia 01/04/2016  . Hyperglyceridemia, pure 06/22/2009  . Avitaminosis D 05/18/2009  . Fam hx-ischem heart disease 04/05/2009  . H/O malignant neoplasm of breast 04/05/2009  . HLD (hyperlipidemia) 04/04/2009  . Adaptive colitis 04/04/2009   Past Medical History  Diagnosis Date  . Radiation 2009    BREAST CA  . Breast cancer ([place]) 2009    RT LUMPECTOMY  . Skin cancer    Current Outpatient Prescriptions on File Prior to Visit  Medication Sig  . amoxicillin (AMOXIL) 875 MG tablet Take 1 tablet (875 mg total) by mouth 2 (two) times daily.  Marland Kitchen CALCIUM-MAGNESIUM-VITAMIN D  ER PO Take 1 tablet by mouth daily.  Marland Kitchen loratadine (CLARITIN) 10 MG tablet Take 10 mg by mouth daily.   . Multiple Vitamin (MULTIVITAMIN) capsule Take 1 capsule by mouth daily.  . Omega-3 Krill Oil 500 MG CAPS Take 750 mg by mouth daily.    No current facility-administered medications on file prior to visit.   Allergies  Allergen Reactions  . Trilipix  [Choline Fenofibrate]     severe indigestion   Past Surgical History  Procedure Laterality Date  . Cholecystectomy  1984  . Endometrial ablation  2005  . Breast surgery Right July 2009    lumpectomy- CA  . Dilation and curettage of uterus  2004  . Tumor removal  12/2013    from uterus done by [facility]  . Breast cyst aspiration Left 2002   Social History   Social History  . Marital Status: Married    Spouse Name: N/A  . Number of Children: N/A  . Years of Education: N/A   Occupational History  . Not on file.   Social History Main Topics  . Smoking status: Never Smoker   . Smokeless tobacco: Never Used  . Alcohol Use: No  . Drug Use: No  . Sexual Activity: Not on file   Other Topics Concern  . Not on file   Social History Narrative   Family History  Problem Relation Age of Onset  . CAD Mother   . Osteoporosis Mother   . Lung cancer Father   . Diabetes Father   .  Hypertension Brother   . Ovarian cancer Maternal Grandmother   . Heart attack Paternal Grandmother   . Diabetes Paternal Grandfather   . Fibromyalgia Brother   . Hypertension Brother   . Hypertension Brother   . Healthy Brother   . Breast cancer Neg Hx        Objective:   Physical Exam  Constitutional: She appears well-developed and well-nourished.  Musculoskeletal:  Laceration, swelling, tenderness, erythema of right fifth digit  Psychiatric: She has a normal mood and affect. Her behavior is normal.      Assessment & Plan:  1. Finger injury, right, initial encounter New problem secondary to fall. Order xray as below. FU with ortho  pending results. Continue ice and buddy tape for now.    - DG Finger Little Right; Future   Patient seen and examined by Jerrell Belfast, MD, and note scribed by Renaldo Fiddler, CMA.  I have reviewed the document for accuracy and completeness and I agree with above. Jerrell Belfast, MD   Margarita Rana, MD

## 2016-05-16 ENCOUNTER — Other Ambulatory Visit: Payer: Self-pay | Admitting: Family Medicine

## 2016-05-16 DIAGNOSIS — N63 Unspecified lump in unspecified breast: Secondary | ICD-10-CM

## 2016-05-17 ENCOUNTER — Telehealth: Payer: Self-pay

## 2016-05-17 NOTE — Telephone Encounter (Signed)
-----   Message from Margarita Rana, MD sent at 05/16/2016  2:24 PM EDT ----- Does have finger fracture. Would buddy tape and refer to orthopedics.  Please see who she is seeing and call them and see if she needs to be seen sooner. Thanks.

## 2016-05-17 NOTE — Telephone Encounter (Signed)
Informed pt as below. Has appointment with Dr. Mack Guise at 0930 on Monday. Renaldo Fiddler, CMA

## 2016-05-29 ENCOUNTER — Ambulatory Visit
Admission: RE | Admit: 2016-05-29 | Discharge: 2016-05-29 | Disposition: A | Discharge status: Home / Self Care | Payer: 59 | Source: Ambulatory Visit | Attending: Family Medicine | Admitting: Family Medicine

## 2016-05-29 ENCOUNTER — Telehealth: Payer: Self-pay | Admitting: Family Medicine

## 2016-05-29 DIAGNOSIS — N63 Unspecified lump in unspecified breast: Secondary | ICD-10-CM

## 2016-05-29 DIAGNOSIS — R928 Other abnormal and inconclusive findings on diagnostic imaging of breast: Secondary | ICD-10-CM

## 2016-05-30 ENCOUNTER — Ambulatory Visit (INDEPENDENT_AMBULATORY_CARE_PROVIDER_SITE_OTHER): Payer: 59 | Admitting: General Surgery

## 2016-05-30 ENCOUNTER — Encounter: Payer: Self-pay | Admitting: General Surgery

## 2016-05-30 VITALS — BP 130/72 | HR 76 | Resp 14 | Ht 62.0 in | Wt 199.0 lb

## 2016-05-30 DIAGNOSIS — R221 Localized swelling, mass and lump, neck: Secondary | ICD-10-CM | POA: Diagnosis not present

## 2016-05-30 DIAGNOSIS — R928 Other abnormal and inconclusive findings on diagnostic imaging of breast: Secondary | ICD-10-CM | POA: Diagnosis not present

## 2016-05-30 MED ORDER — SULFAMETHOXAZOLE-TRIMETHOPRIM 800-160 MG PO TABS: 1.0000 | ORAL_TABLET | Freq: Two times a day (BID) | ORAL | Status: DC

## 2016-05-30 NOTE — Progress Notes (Addendum)
Patient ID: Victoria Benson, female   DOB: 01-14-1956, 60 y.o.   MRN: [id number]  Chief Complaint  Patient presents with  . Follow-up    mammogram    HPI Victoria Benson is a 60 y.o. female.  who presents for a breast evaluation with history of breast cancer. The most recent mammogram and ultrasound was done on 05-29-16.  Patient does perform regular self breast checks and gets regular mammograms done.    Patient previously followed by Leia Alf, M.D. No recent follow-up.  I personally reviewed the patient's history.     HPI  Past Medical History  Diagnosis Date  . Radiation 2009    BREAST CA  . Breast cancer ([place]) 2009    T1c (1.2 cm) N0 ER 90%, PR 90%, HER-2/neu not amplified. Oncotype DX score, low risk (14) 9% chance of recurrence with antiestrogen therapy alone.  . Skin cancer     Past Surgical History  Procedure Laterality Date  . Endometrial ablation  2005  . Dilation and curettage of uterus  2004  . Tumor removal  12/2013    from [facility] done by [facility]  . Cholecystectomy  1984  . Breast surgery Right July 2009    Wide excision, sentinel left node biopsy MammoSite. ( 06/21/2008  . Breast cyst aspiration Left 2002    Family History  Problem Relation Age of Onset  . CAD Mother   . Osteoporosis Mother   . Lung cancer Father   . Diabetes Father   . Hypertension Brother   . Ovarian cancer Maternal Grandmother   . Heart attack Paternal Grandmother   . Diabetes Paternal Grandfather   . Fibromyalgia Brother   . Hypertension Brother   . Hypertension Brother   . Healthy Brother   . Breast cancer Neg Hx     Social History Social History  Substance Use Topics  . Smoking status: Never Smoker   . Smokeless tobacco: Never Used  . Alcohol Use: No    Allergies  Allergen Reactions  . Trilipix  [Choline Fenofibrate]     severe indigestion    Current Outpatient Prescriptions  Medication Sig Dispense Refill  . CALCIUM-MAGNESIUM-VITAMIN D ER PO Take  1 tablet by mouth daily.    Marland Kitchen loratadine (CLARITIN) 10 MG tablet Take 10 mg by mouth daily.     . Multiple Vitamin (MULTIVITAMIN) capsule Take 1 capsule by mouth daily.    . Omega-3 Krill Oil 500 MG CAPS Take 750 mg by mouth daily.     Marland Kitchen sulfamethoxazole-trimethoprim (BACTRIM DS,SEPTRA DS) 800-160 MG tablet Take 1 tablet by mouth 2 (two) times daily. 20 tablet 0   No current facility-administered medications for this visit.    Review of Systems Review of Systems  Constitutional: Negative.   Respiratory: Negative.   Cardiovascular: Negative.     Blood pressure 130/72, pulse 76, resp. rate 14, height 5' 2"  (1.575 m), weight 199 lb (90.266 kg).  Physical Exam Physical Exam  Constitutional: She is oriented to person, place, and time. She appears well-developed and well-nourished.  Eyes: Conjunctivae are normal. No scleral icterus.  Neck: Neck supple.    Cardiovascular: Normal rate, regular rhythm and normal heart sounds.   Pulmonary/Chest: Effort normal and breath sounds normal. Right breast exhibits no inverted nipple, no mass, no nipple discharge, no skin change and no tenderness. Left breast exhibits no inverted nipple, no mass, no nipple discharge, no skin change and no tenderness.    Right breast well healed incision  from 10 to 2 . Little focal thickening.  Abdominal: Soft. Bowel sounds are normal. There is no tenderness.  Lymphadenopathy:    She has no cervical adenopathy.    She has no axillary adenopathy.  Neurological: She is alert and oriented to person, place, and time.  Skin: Skin is warm and dry.  Psychiatric: Her behavior is normal.    Data Reviewed Bilateral mammograms and ultrasounds completed on 05/29/2016 reviewed. New density in the upper inner quadrant of the right breast from December 2015 exam. BI-RADS-4. Lesion reported better visualized on mammography than ultrasound.  Assessment    New right breast mammographic abnormality.  Cutaneous infection right  neck.    Plan    The patient was familiar with breast biopsy and the specifics related the stereotactic biopsy reviewed. She like to defer as such she will be traveling next week and celebrating her husband's birthday.  The patient will be treated with Septra DS 1 by mouth twice a day to resolve the residual inflammation on the right neck prior to her upcoming procedure.    Patient has been scheduled for a right breast stereotactic biopsy at [facility] for 06/17/16 at 3 pm. She will check-in at the [facility] at 2:30 pm. This patient is aware of date, time, and instructions. Patient verbalizes understanding. She will follow up here for a nurse check on 06/24/16 at 1:30 pm.  PCP:  Victoria Benson This information has been scribed by Victoria Fetch RN, BSN,BC.    Victoria Benson 05/31/2016, 12:19 PM

## 2016-05-30 NOTE — Patient Instructions (Addendum)
The patient is aware to call back for any questions or concerns. Stereotactic Breast Biopsy A stereotactic breast biopsy is a procedure in which mammography is used in the collection of a sample of breast tissue. Mammography is a type of X-ray exam of the breasts that produces an image called a mammogram. The mammogram allows your health care provider to precisely locate the area of the breast from which a tissue sample will be taken. The tissue is then examined under a microscope to see if cancerous cells are present. A breast biopsy is done when:   A lump, abnormality, or mass is seen in the breast on a breast X-ray (mammogram).   Small calcium deposits (calcifications) are seen in the breast.   The shape or appearance of the breasts changes.   The shape or appearance of the nipples changes. You may have unusual or bloody discharge coming from the nipples, or you may have crusting, retraction, or dimpling of the nipples. A breast biopsy can indicate if you need surgery or other treatment.  LET [facility] CARE PROVIDER KNOW ABOUT:  Any allergies you have.  All medicines you are taking, including vitamins, herbs, eye drops, creams, and over-the-counter medicines.  Previous problems you or members of your family have had with the use of anesthetics.  Any blood disorders you have.  Previous surgeries you have had.  Medical conditions you have. RISKS AND COMPLICATIONS Generally, stereotactic breast biopsy is a safe procedure. However, as with any procedure, complications can occur. Possible complications include:  Infection at the needle-insertion site.   Bleeding or bruising after surgery.  The breast may become altered or deformed as a result of the procedure.  The needle may go through the chest wall into the lung area.  BEFORE THE PROCEDURE  Wear a supportive bra to the procedure.  You will be asked to remove jewelry, dentures, eyeglasses, metal objects, or clothing  that might interfere with the X-ray images. You may want to leave some of these objects at home.  Arrange for someone to drive you home after the procedure if desired. PROCEDURE  A stereotactic breast biopsy is done while you are awake. During the procedure, relax as much as possible. Let your health care provider know if you are uncomfortable, anxious, or in pain. Usually, the only discomfort felt during the procedure is caused by staying in one position for the length of the procedure. This discomfort can be reduced by carefully placed cushions. Most of the time the biopsy is done using a table with openings on it. You will be asked to lie facedown on the table and place your breasts through the openings. Your breast is compressed between metal plates to get good X-ray images. Your skin will be cleaned, and a numbing medicine (local anesthetic) will be injected. A small cut (incision) will be made in your breast. The tip of the biopsy needle will be directed through the incision. Several small pieces of suspicious tissue will be taken. Then, a final set of X-ray images will be obtained. If they show that the suspicious tissue has been mostly or completely removed, a small clip will be left at the biopsy site. This is done so that the biopsy site can be easily located if the results of the biopsy show that the tissue is cancerous.  After the procedure, the incision will be stitched (sutured) or taped and covered with a bandage (dressing). Your health care provider may apply a pressure dressing and an ice  pack to prevent bleeding and swelling in the breast.  A stereotactic breast biopsy can take 30 minutes or more. AFTER THE PROCEDURE  If you are doing well and have no problems, you will be allowed to go home.    This information is not intended to replace advice given to you by your health care provider. Make sure you discuss any questions you have with your health care provider.   Document Released:  09/14/2003 Document Revised: 12/21/2013 Document Reviewed: 07/15/2013 Elsevier Interactive Patient Education [employer].   Patient is scheduled for a right breast stereo on 06/17/16.  Patient has been scheduled for a right breast stereotactic biopsy at [facility] for 06/17/16 at 3 pm. She will check-in at the [facility] at 2:30 pm. This patient is aware of date, time, and instructions. Patient verbalizes understanding. She will follow up here for a nurse check on 06/24/16 at 1:30 pm.

## 2016-05-31 ENCOUNTER — Other Ambulatory Visit: Payer: Self-pay | Admitting: General Surgery

## 2016-05-31 ENCOUNTER — Encounter: Payer: Self-pay | Admitting: General Surgery

## 2016-05-31 DIAGNOSIS — R221 Localized swelling, mass and lump, neck: Secondary | ICD-10-CM | POA: Insufficient documentation

## 2016-05-31 DIAGNOSIS — R928 Other abnormal and inconclusive findings on diagnostic imaging of breast: Secondary | ICD-10-CM

## 2016-06-03 NOTE — Telephone Encounter (Signed)
Will refer. 

## 2016-06-10 ENCOUNTER — Ambulatory Visit: Payer: 59

## 2016-06-10 ENCOUNTER — Encounter: Payer: Self-pay | Admitting: General Surgery

## 2016-06-17 ENCOUNTER — Ambulatory Visit: Admission: RE | Admit: 2016-06-17 | Payer: 59 | Source: Ambulatory Visit

## 2016-06-17 ENCOUNTER — Ambulatory Visit
Admission: RE | Admit: 2016-06-17 | Discharge: 2016-06-17 | Disposition: A | Discharge status: Home / Self Care | Payer: 59 | Source: Ambulatory Visit | Attending: General Surgery | Admitting: General Surgery

## 2016-06-17 DIAGNOSIS — R920 Mammographic microcalcification found on diagnostic imaging of breast: Secondary | ICD-10-CM | POA: Diagnosis not present

## 2016-06-17 DIAGNOSIS — C50911 Malignant neoplasm of unspecified site of right female breast: Secondary | ICD-10-CM | POA: Diagnosis not present

## 2016-06-17 DIAGNOSIS — R928 Other abnormal and inconclusive findings on diagnostic imaging of breast: Secondary | ICD-10-CM | POA: Diagnosis present

## 2016-06-17 HISTORY — PX: BREAST BIOPSY: SHX20

## 2016-06-18 ENCOUNTER — Telehealth: Payer: Self-pay | Admitting: General Surgery

## 2016-06-18 NOTE — Telephone Encounter (Signed)
Message left on home and cell phone to call for pathology.

## 2016-06-19 ENCOUNTER — Encounter: Payer: Self-pay | Admitting: *Deleted

## 2016-06-20 ENCOUNTER — Encounter: Payer: Self-pay | Admitting: General Surgery

## 2016-06-20 ENCOUNTER — Ambulatory Visit (INDEPENDENT_AMBULATORY_CARE_PROVIDER_SITE_OTHER): Payer: 59 | Admitting: General Surgery

## 2016-06-20 VITALS — BP 118/72 | HR 74 | Resp 14 | Ht 62.0 in | Wt 200.0 lb

## 2016-06-20 DIAGNOSIS — N951 Menopausal and female climacteric states: Secondary | ICD-10-CM | POA: Diagnosis not present

## 2016-06-20 DIAGNOSIS — R232 Flushing: Secondary | ICD-10-CM

## 2016-06-20 LAB — SURGICAL PATHOLOGY

## 2016-06-20 MED ORDER — VENLAFAXINE HCL ER 37.5 MG PO CP24: 37.5000 mg | ORAL_CAPSULE | Freq: Every day | ORAL | Status: DC

## 2016-06-20 NOTE — Progress Notes (Signed)
Patient ID: Victoria Benson, female   DOB: [birth year], 60 y.o.   MRN: [id number]  Chief Complaint  Patient presents with  . Other    discussion    HPI Victoria Benson is a 60 y.o. female here today for a breast cancer discussion.  HPI  Past Medical History  Diagnosis Date  . Radiation 2009    BREAST CA  . Breast cancer ([place]) 2009    T1c (1.2 cm) N0 ER 90%, PR 90%, HER-2/neu not amplified. Oncotype DX score, low risk (14) 9% chance of recurrence with antiestrogen therapy alone.  . Skin cancer     Past Surgical History  Procedure Laterality Date  . Endometrial ablation  2005  . Dilation and curettage of uterus  2004  . Tumor removal  12/2013    from uterus done by [facility]  . Cholecystectomy  1984  . Breast surgery Right July 2009    Wide excision, sentinel left node biopsy MammoSite. ( 06/21/2008  . Breast cyst aspiration Left 2002  . Breast biopsy Right 06/17/2016    stereo path pending    Family History  Problem Relation Age of Onset  . CAD Mother   . Osteoporosis Mother   . Lung cancer Father   . Diabetes Father   . Hypertension Brother   . Ovarian cancer Maternal Grandmother   . Heart attack Paternal Grandmother   . Diabetes Paternal Grandfather   . Fibromyalgia Brother   . Hypertension Brother   . Hypertension Brother   . Healthy Brother   . Breast cancer Neg Hx     Social History Social History  Substance Use Topics  . Smoking status: Never Smoker   . Smokeless tobacco: Never Used  . Alcohol Use: No    Allergies  Allergen Reactions  . Trilipix  [Choline Fenofibrate]     severe indigestion    Current Outpatient Prescriptions  Medication Sig Dispense Refill  . CALCIUM-MAGNESIUM-VITAMIN D ER PO Take 1 tablet by mouth daily.    Marland Kitchen loratadine (CLARITIN) 10 MG tablet Take 10 mg by mouth daily.     . Multiple Vitamin (MULTIVITAMIN) capsule Take 1 capsule by mouth daily.    . Omega-3 Krill Oil 500 MG CAPS Take 750 mg by mouth daily.     Marland Kitchen  sulfamethoxazole-trimethoprim (BACTRIM DS,SEPTRA DS) 800-160 MG tablet Take 1 tablet by mouth 2 (two) times daily. 20 tablet 0  . venlafaxine XR (EFFEXOR XR) 37.5 MG 24 hr capsule Take 1 capsule (37.5 mg total) by mouth daily with breakfast. 30 capsule 0   No current facility-administered medications for this visit.    Review of Systems Review of Systems  Constitutional: Negative.   Respiratory: Negative.   Cardiovascular: Negative.     Blood pressure 118/72, pulse 74, resp. rate 14, height 5' 2"  (1.575 m), weight 200 lb (90.719 kg).  Physical Exam Physical Exam Mild bruising at biopsy site. Steri strips intact. Data Reviewed A. RIGHT BREAST; STEREOTACTIC BIOPSY:  - INVASIVE MAMMARY CARCINOMA OF NO SPECIAL TYPE.  - TUMOR SIZE IN THIS CORE SAMPLE: 4 MM.  - PRELIMINARY GRADE: 1 (NOTTINGHAM HISTOLOGIC GRADE)    TUBULAR/GLANDULAR DIFFERENTIATION SCORE: 3    NUCLEAR PLEOMORPHISM SCORE: 1    MITOTIC RATE SCORE: 1  - PERINEURAL INVASION IS IDENTIFIED.  Breast, Biomarker Reporting Template  BREAST BIOMARKER TESTS  Test(s) Performed: Estrogen Receptor (ER)  Results: POSITIVE  Range: >90%  Test Type:   FDA cleared ([place])  Primary Antibody:  SP1  Progesterone Receptor (PgR)  Results: POSITIVE  Range: 11-50%  Test Type:   FDA cleared ([place])  Primary Antibody:  1E2  HER2 (ERBB2) by Immunohistochemistry (IHC)  Results: Negative (Score 1+)   Assessment    Stage I carcinoma the right breast, second primary.    Plan    Options for management were reviewed. This area is well away from the site of her previous tumor and likely represents a second primary. Considering its small size (maximum 6 mm on ultrasound, 4 mm on mammography, 4 mm on biopsy)  she may well be a candidate for repeat wide excision and either accelerated partial or whole breast radiation. Mastectomy could certainly be considered as standard treatment with recurrent disease. Her breasts are  fairly fatty replaced, and I don't see a benefit from MRI at this time. The patient reports that she had profound vasomotor symptoms that began when she started tamoxifen and Lupron in 2008 and continued when she was changed to anastrozole four-year 3-5 treatment.  The majority of the hour visit was spent reviewing options for management of her new cancer as well as to review options for management of her significant vasomotor symptoms.  Options for her second surgical opinion were reviewed.  She will may benefit from a trial of Effexor, 37.5 mg daily. This will be initiated at present as her vasomotor symptoms have been ongoing even though she completed her antiestrogen therapy in 2014. If she has incomplete relief of symptoms the dose will be increased to 75 mg daily.  The patient will notify the office of how she would like to proceed.    PCP:  Venia Minks This information has been scribed by Gaspar Cola CMA.    Victoria Benson 06/20/2016, 9:46 PM

## 2016-06-24 ENCOUNTER — Telehealth: Payer: Self-pay | Admitting: General Surgery

## 2016-06-24 ENCOUNTER — Encounter: Payer: Self-pay | Admitting: *Deleted

## 2016-06-24 ENCOUNTER — Ambulatory Visit: Payer: 59

## 2016-06-24 NOTE — Progress Notes (Signed)
  Oncology Nurse Navigator Documentation  Navigator Location: [facility] (06/24/16 1500) Navigator Encounter Type: Introductory phone call (06/24/16 1500)   Abnormal Finding Date: 05/29/16 (06/24/16 1500) Confirmed Diagnosis Date: 06/21/16 (06/24/16 1500)         Barriers/Navigation Needs: Coordination of Care;Education (06/24/16 1500)   Interventions: Other (06/24/16 1500)                      Time Spent with Patient: 30 (06/24/16 1500)   Called patient today to establish navigation services.  Patient has a history of right breast cancer 2009, at which time we navigated with her.  States she is planning on a lumpectomy at this time.  States she is fine at this time.  No needs or concerns.  She is to call if she has any questions or needs.

## 2016-06-24 NOTE — Telephone Encounter (Signed)
06-24-16 PT CAME BY TODAY TO LET us KNOW OF HER DECISION FOR SURGERY.SHE HAS DECIDED TO DO A RT LUMPECTOMY.SHE WILL BE OUT OF TOWN FROM 06-29-16 TO 07-08-16.SHE CAN BE REACHED ON HER C#[phone]/MTH

## 2016-06-25 ENCOUNTER — Encounter: Payer: Self-pay | Admitting: General Surgery

## 2016-06-25 ENCOUNTER — Telehealth: Payer: Self-pay | Admitting: General Surgery

## 2016-06-25 NOTE — Telephone Encounter (Signed)
The patient has elected to proceed with repeat lumpectomy and sentinel node biopsy.  Clarified family history: Maternal grandmother and maternal great-grandmother with ovarian cancer.  Mother had hysterectomy in unilateral ovary removal age prior to 43. Alive and well.  May or may not meet criteria for BRCA testing.  We'll review and discuss at follow-up prior to planned surgery.

## 2016-06-26 ENCOUNTER — Encounter: Payer: Self-pay | Admitting: *Deleted

## 2016-06-26 ENCOUNTER — Telehealth: Payer: Self-pay | Admitting: *Deleted

## 2016-06-26 NOTE — Telephone Encounter (Signed)
Appointment made and authorization forms faxed.

## 2016-06-26 NOTE — Telephone Encounter (Signed)
-----  Message from Jeffrey W Byrnett, MD sent at 06/25/2016 10:27 AM EDT ----- Please notify the patient that she is a candidate for BRCA testing. If she would like to have this completed prior to her upcoming surgery, have her come in while you're here to send a sample. If she wants to defer until after surgery, I think it is fine. Thank you 

## 2016-06-26 NOTE — Telephone Encounter (Signed)
-----  Message from Robert Bellow, MD sent at 06/25/2016 10:27 AM EDT ----- Please notify the patient that she is a candidate for BRCA testing. If she would like to have this completed prior to her upcoming surgery, have her come in while you're here to send a sample. If she wants to defer until after surgery, I think it is fine. Thank you

## 2016-06-26 NOTE — Telephone Encounter (Signed)
I talked with the patient and she wants to go ahead with BRCA testing, prep discussed. Appointment for July 11 2016 at 11:00, pt agrees.

## 2016-06-27 ENCOUNTER — Ambulatory Visit: Payer: 59 | Attending: Orthopedic Surgery | Admitting: Occupational Therapy

## 2016-06-27 DIAGNOSIS — R600 Localized edema: Secondary | ICD-10-CM | POA: Diagnosis present

## 2016-06-27 DIAGNOSIS — M79644 Pain in right finger(s): Secondary | ICD-10-CM | POA: Diagnosis present

## 2016-06-27 DIAGNOSIS — M6281 Muscle weakness (generalized): Secondary | ICD-10-CM | POA: Diagnosis present

## 2016-06-27 DIAGNOSIS — M25641 Stiffness of right hand, not elsewhere classified: Secondary | ICD-10-CM | POA: Diagnosis present

## 2016-06-27 NOTE — Patient Instructions (Signed)
Contrast   PROM DIP , PIP and DIP/PIP stretch  Composite PROM stretch  MC flexion  AROM intrinsic fist block AROM to 4cm and 2 cm foam block No pain more than 1-2/10 - pull - stop   Compression sleeve fitted for night time and buddy strap distal to PIP to use with some gripping activiities

## 2016-06-27 NOTE — Therapy (Signed)
[facility] [address], [place], [address] Phone: [phone]   Fax:  [phone]  Occupational Therapy Treatment  Patient Details  Name: Victoria Benson MRN: [id number] Date of Birth: 1956-10-14 Referring Provider: Mack Guise  Encounter Date: 06/27/2016      OT End of Session - 06/27/16 1902    Visit Number 1   Number of Visits 8   Date for OT Re-Evaluation 07/25/16   OT Start Time 1452   OT Stop Time 1550   OT Time Calculation (min) 58 min   Activity Tolerance Patient tolerated treatment well   Behavior During Therapy [facility] for tasks assessed/performed      Past Medical History  Diagnosis Date  . Radiation 2009    BREAST CA  . Breast cancer ([place]) 2009    T1c (1.2 cm) N0 ER 90%, PR 90%, HER-2/neu not amplified. Oncotype DX score, low risk (14) 9% chance of recurrence with antiestrogen therapy alone.  . Skin cancer   . Breast cancer ([place]) 06-17-16    INVASIVE MAMMARY CARCINOMA . T1, ER positive, PR positive, HER-2/neu not overexpressed    Past Surgical History  Procedure Laterality Date  . Endometrial ablation  2005  . Dilation and curettage of uterus  2004  . Tumor removal  12/2013    from uterus done by [facility]  . Cholecystectomy  1984  . Breast surgery Right July 2009    Wide excision, sentinel left node biopsy MammoSite. ( 06/21/2008  . Breast cyst aspiration Left 2002  . Breast biopsy Right 06/17/2016    invasive. T1, ER positive, PR positive, HER-2/neu not overexpressed    There were no vitals filed for this visit.      Subjective Assessment - 06/27/16 1853    Subjective  I fell on my hand and sprain my finger about 16th May - had buddy strap on for 3 wks and then nothing - can do most everything - except grip or lift 'pull - anything that involve full fist    Patient Stated Goals Want to get my pinkie better that I can grip the steering wheel, squeez wascloth , pruning scissors   and hold grand baby   Currently in Pain? Yes   Pain Score 2    Pain Location Finger (Comment which one)   Pain Orientation Right   Pain Descriptors / Indicators Aching   Pain Type Acute pain   Pain Onset 1 to 4 weeks ago            [facility] OT Assessment - 06/27/16 0001    Assessment   Diagnosis R 5th digit sprain   Referring Provider krasinski   Onset Date 05/14/16   Balance Screen   Has the patient fallen in the past 6 months Yes   How many times? 1   Has the patient had a decrease in activity level because of a fear of falling?  No   Is the patient reluctant to leave their home because of a fear of falling?  No   Home  Environment   Lives With Spouse   Prior Function   Leisure R hand dominant - Likes to work in yard, [employer] , do own hosue work ,play grandkids, read    Edema   Edema .7 to .9 cm increase   Strength   Right Hand Grip (lbs) 26   Right Hand Lateral Pinch 16 lbs   Right Hand 3 Point Pinch 12 lbs  Left Hand Grip (lbs) 40   Left Hand Lateral Pinch 18 lbs   Left Hand 3 Point Pinch 16 lbs   Right Hand AROM   R Little  MCP 0-90 85 Degrees   R Little PIP 0-100 35 Degrees       contrast done - 11 min  Reviewed HEP and compression sleeve/buddystrap fitted                   OT Education - 06/27/16 1902    Education provided Yes   Education Details HEP   Person(s) Educated Patient   Methods Explanation;Demonstration;Tactile cues;Verbal cues   Comprehension Verbal cues required;Returned demonstration;Verbalized understanding          OT Short Term Goals - 06/27/16 1907    OT SHORT TERM GOAL #1   Title Pain on PRHWE improve with at least 10 points    Baseline pain on PRWHE at eval 13/50  50% of time pain    Time 2   Period Weeks   Status New   OT SHORT TERM GOAL #2   Title AROM for 5th diigit improve to 2 cm from [facility] to hold toothbrush    Baseline PIP 35 flexion    Time 3   Period Weeks   Status New           OT Long Term Goals  - 06/27/16 1909    OT LONG TERM GOAL #1   Title Edema decreaes by at least ..5 cm fo icnreaes AROM by 20 degrees    Baseline .7 to .9 cm increase at 5th    Time 4   Period Weeks   Status New   OT LONG TERM GOAL #2   Title Function on PRWHE improve at least 20 points    Baseline Function score at eval 43.5/50   Time 4   Period Weeks   Status New   OT LONG TERM GOAL #3   Title Grip improve by 5 lbs to hold baby, cut food and use 5th in squeezing washcloth    Baseline Grip 26 R, 40 L    Time 4   Period Weeks   Status New               Plan - 06/27/16 1903    Clinical Impression Statement Pt present about 6 wks out from sprain of R 5th digiit - pt show increase edema , pain - decrease ROM and strength - lmiting her activities that involve composite fist - and with most of her actiivities she keep 5th extended  - she did not had normal ROM because of born deformities of forearm  and hand    Rehab Potential Good   OT Frequency 2x / week   OT Duration 4 weeks   OT Treatment/Interventions Self-care/ADL training;Contrast Bath;Fluidtherapy;Ultrasound;Patient/family education;Splinting;Manual Therapy;Therapeutic exercise;Passive range of motion   Plan assess edema - progress in ROM    OT Home Exercise Plan see pt instruction   Consulted and Agree with Plan of Care Patient      Patient will benefit from skilled therapeutic intervention in order to improve the following deficits and impairments:  Decreased coordination, Decreased range of motion, Impaired flexibility, Increased edema, Pain, Impaired UE functional use, Decreased strength  Visit Diagnosis: Stiffness of right hand, not elsewhere classified - Plan: Ot plan of care cert/re-cert  Localized edema - Plan: Ot plan of care cert/re-cert  Pain in right finger(s) - Plan: Ot plan of care cert/re-cert  Muscle  weakness (generalized) - Plan: Ot plan of care cert/re-cert    Problem List Patient Active Problem List    Diagnosis Date Noted  . Localized swelling, mass and lump, neck 05/31/2016  . Abnormal mammogram 05/29/2016  . Knee pain, left 05/14/2016  . Subclinical hypothyroidism 03/20/2016  . Allergic rhinitis 01/04/2016  . Blood in the urine 01/04/2016  . Phlebectasia 01/04/2016  . Hyperglyceridemia, pure 06/22/2009  . Avitaminosis D 05/18/2009  . Fam hx-ischem heart disease 04/05/2009  . H/O malignant neoplasm of breast 04/05/2009  . HLD (hyperlipidemia) 04/04/2009  . Adaptive colitis 04/04/2009    Rosalyn Gess OTR/L,CLT  06/27/2016, 7:15 PM  [facility] [address], [place], [address] Phone: [phone]   Fax:  [phone]  Name: Victoria Benson MRN: [id number] Date of Birth: 1956/01/10

## 2016-06-29 DIAGNOSIS — Z1371 Encounter for nonprocreative screening for genetic disease carrier status: Secondary | ICD-10-CM

## 2016-06-29 HISTORY — DX: Encounter for nonprocreative screening for genetic disease carrier status: Z13.71

## 2016-06-29 HISTORY — PX: BREAST EXCISIONAL BIOPSY: SUR124

## 2016-07-03 ENCOUNTER — Ambulatory Visit: Payer: 59 | Attending: Orthopedic Surgery | Admitting: Occupational Therapy

## 2016-07-03 ENCOUNTER — Other Ambulatory Visit: Payer: Self-pay | Admitting: *Deleted

## 2016-07-03 ENCOUNTER — Encounter: Payer: Self-pay | Admitting: *Deleted

## 2016-07-03 DIAGNOSIS — R600 Localized edema: Secondary | ICD-10-CM | POA: Insufficient documentation

## 2016-07-03 DIAGNOSIS — M6281 Muscle weakness (generalized): Secondary | ICD-10-CM | POA: Insufficient documentation

## 2016-07-03 DIAGNOSIS — C50911 Malignant neoplasm of unspecified site of right female breast: Secondary | ICD-10-CM

## 2016-07-03 DIAGNOSIS — M79644 Pain in right finger(s): Secondary | ICD-10-CM | POA: Insufficient documentation

## 2016-07-03 DIAGNOSIS — M25641 Stiffness of right hand, not elsewhere classified: Secondary | ICD-10-CM | POA: Insufficient documentation

## 2016-07-03 NOTE — Therapy (Signed)
[facility] [address], [place], [address] Phone: [phone]   Fax:  [phone]  Occupational Therapy Treatment  Patient Details  Name: Victoria Benson MRN: [id number] Date of Birth: [birth year] Referring Provider: Mack Guise  Encounter Date: 07/03/2016      OT End of Session - 07/03/16 1132    Visit Number 2   Number of Visits 8   Date for OT Re-Evaluation 07/25/16   OT Start Time 1117   OT Stop Time 1159   OT Time Calculation (min) 42 min   Activity Tolerance Patient tolerated treatment well   Behavior During Therapy [facility] for tasks assessed/performed      Past Medical History  Diagnosis Date  . Radiation 2009    BREAST CA  . Breast cancer ([place]) 2009    T1c (1.2 cm) N0 ER 90%, PR 90%, HER-2/neu not amplified. Oncotype DX score, low risk (14) 9% chance of recurrence with antiestrogen therapy alone.  . Skin cancer   . Breast cancer ([place]) 06-17-16    INVASIVE MAMMARY CARCINOMA . T1, ER positive, PR positive, HER-2/neu not overexpressed    Past Surgical History  Procedure Laterality Date  . Endometrial ablation  2005  . Dilation and curettage of uterus  2004  . Tumor removal  12/2013    from uterus done by [facility]  . Cholecystectomy  1984  . Breast surgery Right July 2009    Wide excision, sentinel left node biopsy MammoSite. ( 06/21/2008  . Breast cyst aspiration Left 2002  . Breast biopsy Right 06/17/2016    invasive. T1, ER positive, PR positive, HER-2/neu not overexpressed    There were no vitals filed for this visit.      Subjective Assessment - 07/03/16 1131    Subjective  I can tell is is bending better - and did my exercises 2 x day - did had my biopsy - having lumpectomy on the 20th    Patient Stated Goals Want to get my pinkie better that I can grip the steering wheel, squeez wascloth , pruning scissors  and hold grand baby   Currently in Pain? Yes   Pain Score 1    Pain  Location Finger (Comment which one)   Pain Orientation Right   Pain Descriptors / Indicators Aching            [facility] OT Assessment - 07/03/16 0001    Strength   Right Hand Grip (lbs) 38   Right Hand AROM   R Little PIP 0-100 55 Degrees  60 with MC blocked                   OT Treatments/Exercises (OP) - 07/03/16 0001    RUE Fluidotherapy   Number Minutes Fluidotherapy 10 Minutes   RUE Fluidotherapy Location Hand   Comments AT SOC to increase AROM in 5th diigt and decrease pain       PROM DIP , PIP and DIP/PIP stretch  Composite PROM stretch with pencil blocking MC at 90  MC flexion  AROM intrinsic fist block AROM to 2 cm foam block, and then palm  No pain more than 1-2/10 - pull - stop   Compression sleeve - new one fitted for night time and buddy strap distal to PIP to use with some gripping activiities  Reviewed again wearing of compression and buddy [place]           OT Education - 07/03/16 1132  Education provided Yes   Education Details HEP changes   Person(s) Educated Patient   Methods Explanation;Tactile cues;Demonstration;Verbal cues   Comprehension Verbal cues required;Returned demonstration;Verbalized understanding          OT Short Term Goals - 06/27/16 1907    OT SHORT TERM GOAL #1   Title Pain on PRHWE improve with at least 10 points    Baseline pain on PRWHE at eval 13/50  50% of time pain    Time 2   Period Weeks   Status New   OT SHORT TERM GOAL #2   Title AROM for 5th diigit improve to 2 cm from [facility] to hold toothbrush    Baseline PIP 35 flexion    Time 3   Period Weeks   Status New           OT Long Term Goals - 06/27/16 1909    OT LONG TERM GOAL #1   Title Edema decreaes by at least ..5 cm fo icnreaes AROM by 20 degrees    Baseline .7 to .9 cm increase at 5th    Time 4   Period Weeks   Status New   OT LONG TERM GOAL #2   Title Function on PRWHE improve at least 20 points    Baseline Function score at eval  43.5/50   Time 4   Period Weeks   Status New   OT LONG TERM GOAL #3   Title Grip improve by 5 lbs to hold baby, cut food and use 5th in squeezing washcloth    Baseline Grip 26 R, 40 L    Time 4   Period Weeks   Status New               Plan - 07/03/16 1132    Clinical Impression Statement Pt showed great progress in AROM at R 5th PIP -  decrease pain and increase grip strength -pt to  cont with same HEP but add where she block MC at 90 during composite fisting to increae PIP flexion - during session increae to 75 - 1 cm from palm    Rehab Potential Good   OT Frequency 2x / week   OT Duration 4 weeks   OT Treatment/Interventions Self-care/ADL training;Contrast Bath;Fluidtherapy;Ultrasound;Patient/family education;Splinting;Manual Therapy;Therapeutic exercise;Passive range of motion   Plan assess ROm progress and edema    OT Home Exercise Plan see pt instruction   Consulted and Agree with Plan of Care Patient      Patient will benefit from skilled therapeutic intervention in order to improve the following deficits and impairments:  Decreased coordination, Decreased range of motion, Impaired flexibility, Increased edema, Pain, Impaired UE functional use, Decreased strength  Visit Diagnosis: Stiffness of right hand, not elsewhere classified  Localized edema  Pain in right finger(s)  Muscle weakness (generalized)    Problem List Patient Active Problem List   Diagnosis Date Noted  . Localized swelling, mass and lump, neck 05/31/2016  . Abnormal mammogram 05/29/2016  . Knee pain, left 05/14/2016  . Subclinical hypothyroidism 03/20/2016  . Allergic rhinitis 01/04/2016  . Blood in the urine 01/04/2016  . Phlebectasia 01/04/2016  . Hyperglyceridemia, pure 06/22/2009  . Avitaminosis D 05/18/2009  . Fam hx-ischem heart disease 04/05/2009  . H/O malignant neoplasm of breast 04/05/2009  . HLD (hyperlipidemia) 04/04/2009  . Adaptive colitis 04/04/2009    Rosalyn Gess OTR/L,CLT  07/03/2016, 4:11 PM  [facility] [address]  [place], [place], [address] Phone: [phone]   Fax:  [phone]  Name: Victoria Benson MRN: [id number] Date of Birth: 11-Feb-1956

## 2016-07-03 NOTE — Patient Instructions (Addendum)
  same HEP but add during composite fist - PROM to palm  And keeping pencil in hand to block MC at 90 degrees   Goal 80 degrees at PIP - and touching palm

## 2016-07-08 ENCOUNTER — Encounter: Payer: 59 | Admitting: Occupational Therapy

## 2016-07-10 ENCOUNTER — Ambulatory Visit: Payer: 59 | Admitting: Occupational Therapy

## 2016-07-10 DIAGNOSIS — M25641 Stiffness of right hand, not elsewhere classified: Secondary | ICD-10-CM

## 2016-07-10 DIAGNOSIS — R600 Localized edema: Secondary | ICD-10-CM

## 2016-07-10 DIAGNOSIS — M6281 Muscle weakness (generalized): Secondary | ICD-10-CM

## 2016-07-10 DIAGNOSIS — M79644 Pain in right finger(s): Secondary | ICD-10-CM

## 2016-07-10 NOTE — Patient Instructions (Signed)
Same HEP -    No pain more than 1-2/10 - pull - stop  - during HEP - and no sustained grip or tight - ice if needed over A1 pulley   Compression sleeve - new silicon sleeve  fitted for night time and during day some  And new  buddy strap distal to PIP to use with some gripping activiities  Reviewed again wearing of compression and buddy strap

## 2016-07-10 NOTE — Therapy (Signed)
[place] [facility] [address] [place], [address] Phone: [phone]   Fax:  [phone]  Occupational Therapy Treatment  Patient Details  Name: Victoria Benson MRN: [id number] Date of Birth: 10-Aug-1956 Referring Provider: Mack Guise  Encounter Date: 07/10/2016      OT End of Session - 07/10/16 1154    Visit Number 3   Number of Visits 8   Date for OT Re-Evaluation 07/25/16   OT Start Time 1116   OT Stop Time 1154   OT Time Calculation (min) 38 min   Activity Tolerance Patient tolerated treatment well   Behavior During Therapy [facility] for tasks assessed/performed      Past Medical History  Diagnosis Date  . Radiation 2009    BREAST CA  . Breast cancer ([place]) 2009    T1c (1.2 cm) N0 ER 90%, PR 90%, HER-2/neu not amplified. Oncotype DX score, low risk (14) 9% chance of recurrence with antiestrogen therapy alone.  . Skin cancer   . Breast cancer ([place]) 06-17-16    INVASIVE MAMMARY CARCINOMA . T1, ER positive, PR positive, HER-2/neu not overexpressed    Past Surgical History  Procedure Laterality Date  . Endometrial ablation  2005  . Dilation and curettage of uterus  2004  . Tumor removal  12/2013    from [facility] done by [facility]  . Cholecystectomy  1984  . Breast surgery Right July 2009    Wide excision, sentinel left node biopsy MammoSite. ( 06/21/2008  . Breast cyst aspiration Left 2002  . Breast biopsy Right 06/17/2016    invasive. T1, ER positive, PR positive, HER-2/neu not overexpressed    There were no vitals filed for this visit.      Subjective Assessment - 07/10/16 1114    Subjective  I worked it - using all my fingers like pushing the lawnmower , weed eater - and then playing in the sand with grand kids    Patient Stated Goals Want to get my pinkie better that I can grip the steering wheel, squeez wascloth , pruning scissors  and hold grand baby   Currently in Pain? Yes   Pain Score 2    Pain  Location Finger (Comment which one)   Pain Orientation Right   Pain Descriptors / Indicators Aching   Pain Type Acute pain            [facility] OT Assessment - 07/10/16 0001    Right Hand AROM   R Little PIP 0-100 65 Degrees  80                  OT Treatments/Exercises (OP) - 07/10/16 0001    RUE Fluidotherapy   Number Minutes Fluidotherapy 10 Minutes   RUE Fluidotherapy Location Hand   Comments At [facility] to increase ROM - at 5th and decreaes pain       Measurements taken at [facility]  - walked in 65  AAROM and PROM for 5th PIP flexion  Increase to 80 without heat  - pain 5/10    fluido  PROM DIP , PIP and DIP/PIP stretch  2 x 30 sec  Composite PROM stretch with pencil blocking MC at 90   AROM intrinsic fist block AROM to  palm  and place and hold - pt report one time felt it wanted to lock Pain about 1-2/10 at the worse after heat  No pain more than 1-2/10 - pull - stop  - during HEP -  and no sustained grip or tight - ice if needed over A1 pulley  Did do [place] at 3.3 MHZ , 20% over A 1 pulley at 1.0 intensity  at 5th digit - at 5 min at end   Compression sleeve - new silicon sleeve  fitted for night time and during day some  And new  buddy strap distal to PIP to use with some gripping activiities  Reviewed again wearing of compression and buddy strap            OT Education - 07/10/16 1128    Education provided Yes   Education Details HEP changes   Person(s) Educated Patient   Methods Explanation;Demonstration;Tactile cues;Verbal cues   Comprehension Verbal cues required;Returned demonstration;Verbalized understanding          OT Short Term Goals - 06/27/16 1907    OT SHORT TERM GOAL #1   Title Pain on PRHWE improve with at least 10 points    Baseline pain on PRWHE at eval 13/50  50% of time pain    Time 2   Period Weeks   Status New   OT SHORT TERM GOAL #2   Title AROM for 5th diigit improve to 2 cm from [facility] to hold toothbrush    Baseline PIP 35  flexion    Time 3   Period Weeks   Status New           OT Long Term Goals - 06/27/16 1909    OT LONG TERM GOAL #1   Title Edema decreaes by at least ..5 cm fo icnreaes AROM by 20 degrees    Baseline .7 to .9 cm increase at 5th    Time 4   Period Weeks   Status New   OT LONG TERM GOAL #2   Title Function on PRWHE improve at least 20 points    Baseline Function score at eval 43.5/50   Time 4   Period Weeks   Status New   OT LONG TERM GOAL #3   Title Grip improve by 5 lbs to hold baby, cut food and use 5th in squeezing washcloth    Baseline Grip 26 R, 40 L    Time 4   Period Weeks   Status New               Plan - 07/10/16 1154    Clinical Impression Statement Pt report increase pain and stiffness coming in - but she was using hand more in sustained or tight grip - liike cutting the lawn yesterday - pt to hold off on that but work AROM and PROM like before use in light activities-  and prevent any locking to occur -  did great progress in AROM at PIP at [facility] and during session    Rehab Potential Good   OT Frequency 1x / week   OT Duration 4 weeks   OT Treatment/Interventions Self-care/ADL training;Contrast Bath;Fluidtherapy;Ultrasound;Patient/family education;Splinting;Manual Therapy;Therapeutic exercise;Passive range of motion   Plan assess ROM and progress - ? if any triggering and pain    OT Home Exercise Plan see pt instruction   Consulted and Agree with Plan of Care Patient      Patient will benefit from skilled therapeutic intervention in order to improve the following deficits and impairments:  Decreased coordination, Decreased range of motion, Impaired flexibility, Increased edema, Pain, Impaired UE functional use, Decreased strength  Visit Diagnosis: Stiffness of right hand, not elsewhere classified  Localized edema  Pain in right finger(s)  Muscle weakness (generalized)    Problem List Patient Active Problem List   Diagnosis Date Noted  .  Localized swelling, mass and lump, neck 05/31/2016  . Abnormal mammogram 05/29/2016  . Knee pain, left 05/14/2016  . Subclinical hypothyroidism 03/20/2016  . Allergic rhinitis 01/04/2016  . Blood in the urine 01/04/2016  . Phlebectasia 01/04/2016  . Hyperglyceridemia, pure 06/22/2009  . Avitaminosis D 05/18/2009  . Fam hx-ischem heart disease 04/05/2009  . H/O malignant neoplasm of breast 04/05/2009  . HLD (hyperlipidemia) 04/04/2009  . Adaptive colitis 04/04/2009    Rosalyn Gess OTR/L,CLT  07/10/2016, 11:57 AM  [facility] [address], [place], [address] Phone: [phone]   Fax:  [phone]  Name: Victoria Benson MRN: [id number] Date of Birth: [birth year]

## 2016-07-11 ENCOUNTER — Other Ambulatory Visit: Payer: Self-pay

## 2016-07-11 ENCOUNTER — Other Ambulatory Visit: Payer: 59

## 2016-07-11 ENCOUNTER — Encounter: Payer: Self-pay | Admitting: *Deleted

## 2016-07-11 ENCOUNTER — Encounter: Payer: Self-pay | Admitting: General Surgery

## 2016-07-11 ENCOUNTER — Ambulatory Visit (INDEPENDENT_AMBULATORY_CARE_PROVIDER_SITE_OTHER): Payer: 59 | Admitting: General Surgery

## 2016-07-11 VITALS — BP 132/78 | HR 78 | Resp 14 | Ht 62.0 in | Wt 201.0 lb

## 2016-07-11 DIAGNOSIS — C50911 Malignant neoplasm of unspecified site of right female breast: Secondary | ICD-10-CM | POA: Diagnosis not present

## 2016-07-11 DIAGNOSIS — Z853 Personal history of malignant neoplasm of breast: Secondary | ICD-10-CM

## 2016-07-11 DIAGNOSIS — C50211 Malignant neoplasm of upper-inner quadrant of right female breast: Secondary | ICD-10-CM | POA: Insufficient documentation

## 2016-07-11 DIAGNOSIS — Z170 Estrogen receptor positive status [ER+]: Secondary | ICD-10-CM

## 2016-07-11 NOTE — H&P (Signed)
HPI  Victoria Benson is a 60 y.o. female. Here today for pre op right breast wide excision and BRCA genetic testing.  Here today with her husband Victoria Benson.  HPI  Past Medical History   Diagnosis  Date   .  Radiation  2009     BREAST CA   .  Breast cancer ([place])  2009     T1c (1.2 cm) N0 ER 90%, PR 90%, HER-2/neu not amplified. Oncotype DX score, low risk (14) 9% chance of recurrence with antiestrogen therapy alone.   .  Skin cancer    .  Breast cancer ([place])  06-17-16     INVASIVE MAMMARY CARCINOMA . T1, ER positive, PR positive, HER-2/neu not overexpressed    Past Surgical History   Procedure  Laterality  Date   .  Endometrial ablation   2005   .  Dilation and curettage of uterus   2004   .  Tumor removal   12/2013     from [facility] done by [facility]   .  Cholecystectomy   1984   .  Breast surgery  Right  July 2009     Wide excision, sentinel left node biopsy MammoSite. ( 06/21/2008   .  Breast cyst aspiration  Left  2002   .  Breast biopsy  Right  06/17/2016     invasive. T1, ER positive, PR positive, HER-2/neu not overexpressed    Family History   Problem  Relation  Age of Onset   .  CAD  Mother    .  Osteoporosis  Mother    .  Lung cancer  Father    .  Diabetes  Father    .  Hypertension  Brother    .  Ovarian cancer  Maternal Grandmother    .  Heart attack  Paternal Grandmother    .  Diabetes  Paternal Grandfather    .  Fibromyalgia  Brother    .  Hypertension  Brother    .  Hypertension  Brother    .  Healthy  Brother    .  Breast cancer  Neg Hx    .  Ovarian cancer  Other     Social History  Social History   Substance Use Topics   .  Smoking status:  Never Smoker   .  Smokeless tobacco:  Never Used   .  Alcohol Use:  No    No Known Allergies  Current Outpatient Prescriptions   Medication  Sig  Dispense  Refill   .  CALCIUM-MAGNESIUM-VITAMIN D ER PO  Take 1 tablet by mouth daily.     Marland Kitchen  loratadine (CLARITIN) 10 MG tablet  Take 10 mg by mouth daily.     .   Multiple Vitamin (MULTIVITAMIN) capsule  Take 1 capsule by mouth daily.     .  Omega-3 Krill Oil 500 MG CAPS  Take 750 mg by mouth daily.     Marland Kitchen  venlafaxine XR (EFFEXOR XR) 37.5 MG 24 hr capsule  Take 1 capsule (37.5 mg total) by mouth daily with breakfast. (Patient not taking: Reported on 07/11/2016)  30 capsule  0    No current facility-administered medications for this visit.    Review of Systems  Review of Systems  Constitutional: Negative.  Respiratory: Negative.  Cardiovascular: Negative.   Blood pressure 132/78, pulse 78, resp. rate 14, height _0  (1.575 m), weight 201 lb (91.173 kg).  Physical Exam  Physical Exam  Constitutional: She is oriented to person, place, and time. She appears well-developed and well-nourished.  HENT:  Mouth/Throat: Oropharynx is clear and moist.  Eyes: Conjunctivae are normal. No scleral icterus.  Neck: Neck supple.  Cardiovascular: Normal rate, regular rhythm and normal heart sounds.  Pulmonary/Chest: Effort normal and breath sounds normal.    Lymphadenopathy:  She has no cervical adenopathy.  Neurological: She is alert and oriented to person, place, and time.  Skin: Skin is warm and dry.  Psychiatric: Her behavior is normal.   Data Reviewed  Ultrasound examination of the right breast was undertaken to determine if preoperative needle localization would be required. In the 3:00 position 8 cm from the nipple an ill-defined 0.7 x 0.8 x 0.88 cm area with acoustic shadowing is noted. This is near the 10 cm mark reported on the [facility] ultrasound prior to her biopsy. This is likely the site of her new malignancy but is poorly defined. Needle localization is being recommended. [id number]-6.  Assessment   Stage I carcinoma the right breast.  Candidate for genetic testing with development of second primary malignancy.   Plan   The case has been reviewed with radiation oncology. No contraindication to repeat wide excision and accelerated partial breast  radiation.  Plans for sentinel node biopsy reviewed. Slightly lower possibility of identification with previous right upper quadrant dissection.  The patient's husband question whether a PET scan would be indicated at this time. Pending wide excision and node biopsy this would not be appropriate.   Patient's surgery has been scheduled for 07-18-16 at [facility].  Genetic testing completed.  PCP: Venia Minks  This information has been scribed by Karie Fetch RN, BSN,BC.  Robert Bellow  07/11/2016, 12:21 PM

## 2016-07-11 NOTE — Patient Instructions (Signed)
The patient is aware to call back for any questions or concerns.  

## 2016-07-11 NOTE — Patient Instructions (Signed)
  Your procedure is scheduled on: 07-18-16 Report to [place] @ 8 AM    Remember: Instructions that are not followed completely may result in serious medical risk, up to and including death, or upon the discretion of your surgeon and anesthesiologist your surgery may need to be rescheduled.    _x___ 1. Do not eat food or drink liquids after midnight. No gum chewing or hard candies.     __x__ 2. No Alcohol for 24 hours before or after surgery.   __x__3. No Smoking for 24 prior to surgery.   ____  4. Bring all medications with you on the day of surgery if instructed.    __x__ 5. Notify your doctor if there is any change in your medical condition     (cold, fever, infections).     Do not wear jewelry, make-up, hairpins, clips or nail polish.  Do not wear lotions, powders, or perfumes. You may wear deodorant.  Do not shave 48 hours prior to surgery. Men may shave face and neck.  Do not bring valuables to the hospital.    [facility] is not responsible for any belongings or valuables.               Contacts, dentures or bridgework may not be worn into surgery.  Leave your suitcase in the car. After surgery it may be brought to your room.  For patients admitted to the hospital, discharge time is determined by your treatment team.   Patients discharged the day of surgery will not be allowed to drive home.    Please read over the following fact sheets that you were given:   [facility] Preparing for Surgery and or MRSA Information   ____ Take these medicines the morning of surgery with A SIP OF WATER:    1. NONE  2.  3.  4.  5.  6.  ____ Fleet Enema (as directed)   _x___ Use CHG Soap or sage wipes as directed on instruction sheet   ____ Use inhalers on the day of surgery and bring to hospital day of surgery  ____ Stop metformin 2 days prior to surgery    ____ Take 1/2 of usual insulin dose the night before surgery and none on the morning of  surgery.   ____  Stop aspirin or coumadin, or plavix  ___ Stop Anti-inflammatories such as Advil, Aleve, Ibuprofen, Motrin, Naproxen,          Naprosyn, Goodies powders or aspirin products. Ok to take Tylenol.   _X___ Stop supplements until after surgery-STOP KRILL OIL NOW   ____ Bring C-Pap to the hospital.

## 2016-07-11 NOTE — Progress Notes (Signed)
Patient ID: LONETA TAMPLIN, female   DOB: [birth year], 60 y.o.   MRN: [id number]  Chief Complaint  Patient presents with  . Pre-op Exam    breast ultrasound    HPI CINDEL DAUGHERTY is a 60 y.o. female.  Here today for pre op right breast wide excision and BRCA genetic testing. Here today with her husband Kagan Hietpas.  HPI  Past Medical History  Diagnosis Date  . Radiation 2009    BREAST CA  . Breast cancer ([place]) 2009    T1c (1.2 cm) N0 ER 90%, PR 90%, HER-2/neu not amplified. Oncotype DX score, low risk (14) 9% chance of recurrence with antiestrogen therapy alone.  . Skin cancer   . Breast cancer ([place]) 06-17-16    INVASIVE MAMMARY CARCINOMA . T1, ER positive, PR positive, HER-2/neu not overexpressed    Past Surgical History  Procedure Laterality Date  . Endometrial ablation  2005  . Dilation and curettage of uterus  2004  . Tumor removal  12/2013    from uterus done by [facility]  . Cholecystectomy  1984  . Breast surgery Right July 2009    Wide excision, sentinel left node biopsy MammoSite. ( 06/21/2008  . Breast cyst aspiration Left 2002  . Breast biopsy Right 06/17/2016    invasive. T1, ER positive, PR positive, HER-2/neu not overexpressed    Family History  Problem Relation Age of Onset  . CAD Mother   . Osteoporosis Mother   . Lung cancer Father   . Diabetes Father   . Hypertension Brother   . Ovarian cancer Maternal Grandmother   . Heart attack Paternal Grandmother   . Diabetes Paternal Grandfather   . Fibromyalgia Brother   . Hypertension Brother   . Hypertension Brother   . Healthy Brother   . Breast cancer Neg Hx   . Ovarian cancer Other     Social History Social History  Substance Use Topics  . Smoking status: Never Smoker   . Smokeless tobacco: Never Used  . Alcohol Use: No    No Known Allergies  Current Outpatient Prescriptions  Medication Sig Dispense Refill  . CALCIUM-MAGNESIUM-VITAMIN D ER PO Take 1 tablet by mouth daily.    Marland Kitchen loratadine  (CLARITIN) 10 MG tablet Take 10 mg by mouth daily.     . Multiple Vitamin (MULTIVITAMIN) capsule Take 1 capsule by mouth daily.    . Omega-3 Krill Oil 500 MG CAPS Take 750 mg by mouth daily.     Marland Kitchen venlafaxine XR (EFFEXOR XR) 37.5 MG 24 hr capsule Take 1 capsule (37.5 mg total) by mouth daily with breakfast. (Patient not taking: Reported on 07/11/2016) 30 capsule 0   No current facility-administered medications for this visit.    Review of Systems Review of Systems  Constitutional: Negative.   Respiratory: Negative.   Cardiovascular: Negative.     Blood pressure 132/78, pulse 78, resp. rate 14, height 5' 2"  (1.575 m), weight 201 lb (91.173 kg).  Physical Exam Physical Exam  Constitutional: She is oriented to person, place, and time. She appears well-developed and well-nourished.  HENT:  Mouth/Throat: Oropharynx is clear and moist.  Eyes: Conjunctivae are normal. No scleral icterus.  Neck: Neck supple.  Cardiovascular: Normal rate, regular rhythm and normal heart sounds.   Pulmonary/Chest: Effort normal and breath sounds normal.    Lymphadenopathy:    She has no cervical adenopathy.  Neurological: She is alert and oriented to person, place, and time.  Skin: Skin is warm and  dry.  Psychiatric: Her behavior is normal.    Data Reviewed Ultrasound examination of the right breast was undertaken to determine if preoperative needle localization would be required. In the 3:00 position 8 cm from the nipple an ill-defined 0.7 x 0.8 x 0.88 cm area with acoustic shadowing is noted. This is near the 10 cm mark reported on the [facility] ultrasound prior to her biopsy. This is likely the site of her new malignancy but is poorly defined. Needle localization is being recommended. BI-RADS-6.  Assessment    Stage I carcinoma the right breast.  Candidate for genetic testing with development of second primary malignancy.    Plan    The case has been reviewed with radiation oncology. No  contraindication to repeat wide excision and accelerated partial breast radiation.  Plans for sentinel node biopsy reviewed. Slightly lower possibility of identification with previous right upper quadrant dissection.  The patient's husband question whether a PET scan would be indicated at this time. Pending wide excision and node biopsy this would not be appropriate.    Patient's surgery has been scheduled for 07-18-16 at [facility].   Genetic testing completed.  PCP:  Venia Minks This information has been scribed by Karie Fetch RN, BSN,BC.     Robert Bellow 07/11/2016, 12:21 PM

## 2016-07-12 ENCOUNTER — Ambulatory Visit: Payer: 59 | Admitting: Occupational Therapy

## 2016-07-16 ENCOUNTER — Encounter
Admission: RE | Admit: 2016-07-16 | Discharge: 2016-07-16 | Disposition: A | Discharge status: Home / Self Care | Payer: 59 | Source: Ambulatory Visit | Attending: General Surgery | Admitting: General Surgery

## 2016-07-16 ENCOUNTER — Ambulatory Visit: Payer: 59 | Admitting: Occupational Therapy

## 2016-07-16 DIAGNOSIS — R600 Localized edema: Secondary | ICD-10-CM

## 2016-07-16 DIAGNOSIS — M25641 Stiffness of right hand, not elsewhere classified: Secondary | ICD-10-CM

## 2016-07-16 DIAGNOSIS — Z923 Personal history of irradiation: Secondary | ICD-10-CM | POA: Diagnosis not present

## 2016-07-16 DIAGNOSIS — Z9049 Acquired absence of other specified parts of digestive tract: Secondary | ICD-10-CM | POA: Diagnosis not present

## 2016-07-16 DIAGNOSIS — Z8269 Family history of other diseases of the musculoskeletal system and connective tissue: Secondary | ICD-10-CM | POA: Diagnosis not present

## 2016-07-16 DIAGNOSIS — Z8041 Family history of malignant neoplasm of ovary: Secondary | ICD-10-CM | POA: Diagnosis not present

## 2016-07-16 DIAGNOSIS — M79644 Pain in right finger(s): Secondary | ICD-10-CM

## 2016-07-16 DIAGNOSIS — Z801 Family history of malignant neoplasm of trachea, bronchus and lung: Secondary | ICD-10-CM | POA: Diagnosis not present

## 2016-07-16 DIAGNOSIS — Z833 Family history of diabetes mellitus: Secondary | ICD-10-CM | POA: Diagnosis not present

## 2016-07-16 DIAGNOSIS — C50911 Malignant neoplasm of unspecified site of right female breast: Secondary | ICD-10-CM | POA: Diagnosis present

## 2016-07-16 DIAGNOSIS — Z170 Estrogen receptor positive status [ER+]: Secondary | ICD-10-CM | POA: Diagnosis not present

## 2016-07-16 DIAGNOSIS — Z79899 Other long term (current) drug therapy: Secondary | ICD-10-CM | POA: Diagnosis not present

## 2016-07-16 DIAGNOSIS — Z8262 Family history of osteoporosis: Secondary | ICD-10-CM | POA: Diagnosis not present

## 2016-07-16 DIAGNOSIS — Z853 Personal history of malignant neoplasm of breast: Secondary | ICD-10-CM | POA: Diagnosis not present

## 2016-07-16 DIAGNOSIS — Z8249 Family history of ischemic heart disease and other diseases of the circulatory system: Secondary | ICD-10-CM | POA: Diagnosis not present

## 2016-07-16 DIAGNOSIS — M6281 Muscle weakness (generalized): Secondary | ICD-10-CM

## 2016-07-16 NOTE — Therapy (Signed)
[place] [address] [place], [address] Phone: [phone]   Fax:  [phone]  Occupational Therapy Treatment  Patient Details  Name: Victoria Benson MRN: [id number] Date of Birth: [birth year] Referring Provider: Mack Guise  Encounter Date: 07/16/2016      OT End of Session - 07/16/16 1148    Visit Number 4   Number of Visits 8   Date for OT Re-Evaluation 07/25/16   OT Start Time 1130   OT Stop Time 1216   OT Time Calculation (min) 46 min   Activity Tolerance Patient tolerated treatment well   Behavior During Therapy [facility] for tasks assessed/performed      Past Medical History  Diagnosis Date  . Radiation 2009    BREAST CA  . Breast cancer ([place]) 2009    T1c (1.2 cm) N0 ER 90%, PR 90%, HER-2/neu not amplified. Oncotype DX score, low risk (14) 9% chance of recurrence with antiestrogen therapy alone.  . Skin cancer   . Breast cancer ([place]) 06-17-16    INVASIVE MAMMARY CARCINOMA . T1, ER positive, PR positive, HER-2/neu not overexpressed  . Heart murmur   . Family history of adverse reaction to anesthesia     PTS MOM-HARD TO WAKE UP    Past Surgical History  Procedure Laterality Date  . Endometrial ablation  2005  . Dilation and curettage of uterus  2004  . Tumor removal  12/2013    from uterus done by [facility]  . Cholecystectomy  1984  . Breast surgery Right July 2009    Wide excision, sentinel left node biopsy MammoSite. ( 06/21/2008  . Breast cyst aspiration Left 2002  . Breast biopsy Right 06/17/2016    invasive. T1, ER positive, PR positive, HER-2/neu not overexpressed    There were no vitals filed for this visit.      Subjective Assessment - 07/16/16 1133    Subjective  My finger feels more normal - I can get it down to my palm during my exercises- maybe  cutting withi knifte - but can hold grandbaby ,  cut lawn, prune with scissors ,    Patient Stated Goals Want to get my pinkie  better that I can grip the steering wheel, squeez wascloth , pruning scissors  and hold grand baby   Currently in Pain? No/denies            [facility] OT Assessment - 07/16/16 0001    Strength   Right Hand Grip (lbs) 38   Right Hand Lateral Pinch 18 lbs   Right Hand 3 Point Pinch 14 lbs   Left Hand Grip (lbs) 40   Left Hand Lateral Pinch 18 lbs   Left Hand 3 Point Pinch 16 lbs   Right Hand AROM   R Little PIP 0-100 67 Degrees                  OT Treatments/Exercises (OP) - 07/16/16 0001    RUE Paraffin   Number Minutes Paraffin 10 Minutes   RUE Paraffin Location Hand   Comments hand in fist for flexion of 5th at [facility] to icnreaes ROM and decrease stiffness      Measurements taken at [facility] - walked in 65  Paraffin done -  AAROM and PROM for 5th PIP flexion  Increase to 90 without heat - pain 3/10   Parafin with hand in fist  PROM DIP , PIP and DIP/PIP stretch 3 x 30 sec  Block PIP - AROM intrinsic fist  AROM with pencil to block MC - allow more PIP AROM   Add putty light blue - for grip  5th digit flexion into small ball of putty - end range - but with pencil to block MC over flexion  Change to teal for grip - but prevent triggering in 5th and 3rd - stop before triggering - not tight - tight   No pain more than 1-2/10 - pull - stop - during HEP - and no sustained grip or tight - ice if needed over A1 pulley   Did do [place] at 3.3 MHZ , 20% over A 1 pulley at 1.0 intensity at 5th digit and 3rd - at 5 min at end   Compression sleeve - new silicon sleeve fitted for night time and during day some And cont  buddy strap distal to PIP to use with some gripping activiities  Reviewed again wearing of compression and buddy strap           OT Education - 07/16/16 1148    Education provided Yes   Education Details HEP update - add putty    Person(s) Educated Patient   Methods Explanation;Demonstration;Verbal cues;Handout;Tactile cues   Comprehension  Returned demonstration;Verbalized understanding;Verbal cues required          OT Short Term Goals - 06/27/16 1907    OT SHORT TERM GOAL #1   Title Pain on PRHWE improve with at least 10 points    Baseline pain on PRWHE at eval 13/50  50% of time pain    Time 2   Period Weeks   Status New   OT SHORT TERM GOAL #2   Title AROM for 5th diigit improve to 2 cm from [facility] to hold toothbrush    Baseline PIP 35 flexion    Time 3   Period Weeks   Status New           OT Long Term Goals - 06/27/16 1909    OT LONG TERM GOAL #1   Title Edema decreaes by at least ..5 cm fo icnreaes AROM by 20 degrees    Baseline .7 to .9 cm increase at 5th    Time 4   Period Weeks   Status New   OT LONG TERM GOAL #2   Title Function on PRWHE improve at least 20 points    Baseline Function score at eval 43.5/50   Time 4   Period Weeks   Status New   OT LONG TERM GOAL #3   Title Grip improve by 5 lbs to hold baby, cut food and use 5th in squeezing washcloth    Baseline Grip 26 R, 40 L    Time 4   Period Weeks   Status New               Plan - 07/16/16 1148    Clinical Impression Statement Pt AROM about hte same this date - did had some triggeing with composite fist - pt to use pencil to block MC during composite fist to increase PIP flexion and decrease triggering - did add putty for strenghthing for grip and  end range  for 5th    Rehab Potential Good   OT Frequency 1x / week   OT Duration 4 weeks   OT Treatment/Interventions Self-care/ADL training;Contrast Bath;Fluidtherapy;Ultrasound;Patient/family education;Splinting;Manual Therapy;Therapeutic exercise;Passive range of motion   Plan assess if triggering - and how putty doing    OT Home Exercise Plan see pt  instruction   Consulted and Agree with Plan of Care Patient      Patient will benefit from skilled therapeutic intervention in order to improve the following deficits and impairments:  Decreased coordination, Decreased range of  motion, Impaired flexibility, Increased edema, Pain, Impaired UE functional use, Decreased strength  Visit Diagnosis: Localized edema  Stiffness of right hand, not elsewhere classified  Pain in right finger(s)  Muscle weakness (generalized)    Problem List Patient Active Problem List   Diagnosis Date Noted  . Breast cancer, female, right 07/11/2016  . Localized swelling, mass and lump, neck 05/31/2016  . Abnormal mammogram 05/29/2016  . Knee pain, left 05/14/2016  . Subclinical hypothyroidism 03/20/2016  . Allergic rhinitis 01/04/2016  . Blood in the urine 01/04/2016  . Phlebectasia 01/04/2016  . Hyperglyceridemia, pure 06/22/2009  . Avitaminosis D 05/18/2009  . Fam hx-ischem heart disease 04/05/2009  . H/O malignant neoplasm of breast 04/05/2009  . HLD (hyperlipidemia) 04/04/2009  . Adaptive colitis 04/04/2009    Rosalyn Gess OTR/L,CLT  07/16/2016, 4:58 PM  [place] [facility] [address] [place], [address] Phone: [phone]   Fax:  [phone]  Name: NICHOEL DIGIULIO MRN: [id number] Date of Birth: 1956-02-20

## 2016-07-17 LAB — CANCER ANTIGEN 27.29: CA 27.29: 12.1 U/mL (ref 0.0–38.6)

## 2016-07-18 ENCOUNTER — Ambulatory Visit: Payer: 59 | Admitting: Certified Registered"

## 2016-07-18 ENCOUNTER — Ambulatory Visit
Admission: RE | Admit: 2016-07-18 | Discharge: 2016-07-18 | Disposition: A | Discharge status: Home / Self Care | Payer: 59 | Source: Ambulatory Visit | Attending: General Surgery | Admitting: General Surgery

## 2016-07-18 ENCOUNTER — Encounter
Admission: RE | Disposition: A | Discharge status: Home / Self Care | Payer: Self-pay | Source: Ambulatory Visit | Attending: General Surgery

## 2016-07-18 ENCOUNTER — Encounter
Admission: RE | Admit: 2016-07-18 | Discharge: 2016-07-18 | Disposition: A | Discharge status: Home / Self Care | Payer: 59 | Source: Ambulatory Visit | Attending: General Surgery | Admitting: General Surgery

## 2016-07-18 ENCOUNTER — Encounter: Payer: Self-pay | Admitting: *Deleted

## 2016-07-18 DIAGNOSIS — Z833 Family history of diabetes mellitus: Secondary | ICD-10-CM | POA: Insufficient documentation

## 2016-07-18 DIAGNOSIS — Z853 Personal history of malignant neoplasm of breast: Secondary | ICD-10-CM | POA: Insufficient documentation

## 2016-07-18 DIAGNOSIS — Z79899 Other long term (current) drug therapy: Secondary | ICD-10-CM | POA: Insufficient documentation

## 2016-07-18 DIAGNOSIS — C50911 Malignant neoplasm of unspecified site of right female breast: Secondary | ICD-10-CM | POA: Insufficient documentation

## 2016-07-18 DIAGNOSIS — Z170 Estrogen receptor positive status [ER+]: Secondary | ICD-10-CM | POA: Insufficient documentation

## 2016-07-18 DIAGNOSIS — Z8269 Family history of other diseases of the musculoskeletal system and connective tissue: Secondary | ICD-10-CM | POA: Insufficient documentation

## 2016-07-18 DIAGNOSIS — Z8249 Family history of ischemic heart disease and other diseases of the circulatory system: Secondary | ICD-10-CM | POA: Insufficient documentation

## 2016-07-18 DIAGNOSIS — Z8262 Family history of osteoporosis: Secondary | ICD-10-CM | POA: Insufficient documentation

## 2016-07-18 DIAGNOSIS — C50211 Malignant neoplasm of upper-inner quadrant of right female breast: Secondary | ICD-10-CM | POA: Diagnosis not present

## 2016-07-18 DIAGNOSIS — Z923 Personal history of irradiation: Secondary | ICD-10-CM | POA: Insufficient documentation

## 2016-07-18 DIAGNOSIS — Z8041 Family history of malignant neoplasm of ovary: Secondary | ICD-10-CM | POA: Insufficient documentation

## 2016-07-18 DIAGNOSIS — Z9049 Acquired absence of other specified parts of digestive tract: Secondary | ICD-10-CM | POA: Insufficient documentation

## 2016-07-18 DIAGNOSIS — Z801 Family history of malignant neoplasm of trachea, bronchus and lung: Secondary | ICD-10-CM | POA: Insufficient documentation

## 2016-07-18 HISTORY — DX: Family history of other specified conditions: Z84.89

## 2016-07-18 HISTORY — PX: BREAST LUMPECTOMY WITH NEEDLE LOCALIZATION: SHX5759

## 2016-07-18 HISTORY — DX: Cardiac murmur, unspecified: R01.1

## 2016-07-18 SURGERY — BREAST LUMPECTOMY WITH NEEDLE LOCALIZATION
Anesthesia: General | Laterality: Right | Wound class: Clean

## 2016-07-18 MED ORDER — OXYCODONE HCL 5 MG PO TABS: 5.0000 mg | ORAL_TABLET | Freq: Once | ORAL | Status: DC | PRN

## 2016-07-18 MED ORDER — PROPOFOL 10 MG/ML IV BOLUS
INTRAVENOUS | Status: DC | PRN
  Administered 2016-07-18: 130 mg via INTRAVENOUS
  Administered 2016-07-18: 10 mg via INTRAVENOUS

## 2016-07-18 MED ORDER — ACETAMINOPHEN 10 MG/ML IV SOLN
INTRAVENOUS | Status: AC
  Filled 2016-07-18: qty 100

## 2016-07-18 MED ORDER — FENTANYL CITRATE (PF) 100 MCG/2ML IJ SOLN
25.0000 ug | INTRAMUSCULAR | Status: DC | PRN
  Administered 2016-07-18 (×3): 25 ug via INTRAVENOUS

## 2016-07-18 MED ORDER — FENTANYL CITRATE (PF) 100 MCG/2ML IJ SOLN
INTRAMUSCULAR | Status: DC | PRN
  Administered 2016-07-18: 50 ug via INTRAVENOUS
  Administered 2016-07-18: 25 ug via INTRAVENOUS
  Administered 2016-07-18: 50 ug via INTRAVENOUS
  Administered 2016-07-18: 25 ug via INTRAVENOUS
  Administered 2016-07-18: 50 ug via INTRAVENOUS

## 2016-07-18 MED ORDER — MIDAZOLAM HCL 2 MG/2ML IJ SOLN
INTRAMUSCULAR | Status: DC | PRN
  Administered 2016-07-18: 2 mg via INTRAVENOUS

## 2016-07-18 MED ORDER — BUPIVACAINE-EPINEPHRINE (PF) 0.5% -1:200000 IJ SOLN
INTRAMUSCULAR | Status: AC
  Filled 2016-07-18: qty 30

## 2016-07-18 MED ORDER — KETOROLAC TROMETHAMINE 30 MG/ML IJ SOLN
INTRAMUSCULAR | Status: DC | PRN
  Administered 2016-07-18: 30 mg via INTRAVENOUS

## 2016-07-18 MED ORDER — PROMETHAZINE HCL 25 MG/ML IJ SOLN: 6.2500 mg | INTRAMUSCULAR | Status: DC | PRN

## 2016-07-18 MED ORDER — TECHNETIUM TC 99M SULFUR COLLOID
0.9450 | Freq: Once | INTRAVENOUS | Status: AC | PRN
  Administered 2016-07-18: 0.945 via INTRAVENOUS

## 2016-07-18 MED ORDER — FAMOTIDINE 20 MG PO TABS
ORAL_TABLET | ORAL | Status: AC
Start: 2016-07-18 — End: 2016-07-18
  Administered 2016-07-18: 20 mg via ORAL
  Filled 2016-07-18: qty 1

## 2016-07-18 MED ORDER — FENTANYL CITRATE (PF) 100 MCG/2ML IJ SOLN
INTRAMUSCULAR | Status: AC
  Administered 2016-07-18: 25 ug via INTRAVENOUS
  Filled 2016-07-18: qty 2

## 2016-07-18 MED ORDER — LIDOCAINE HCL (CARDIAC) 20 MG/ML IV SOLN
INTRAVENOUS | Status: DC | PRN
  Administered 2016-07-18: 80 mg via INTRAVENOUS

## 2016-07-18 MED ORDER — METHYLENE BLUE 0.5 % INJ SOLN
INTRAVENOUS | Status: AC
  Filled 2016-07-18: qty 10

## 2016-07-18 MED ORDER — DEXAMETHASONE SODIUM PHOSPHATE 10 MG/ML IJ SOLN
INTRAMUSCULAR | Status: DC | PRN
  Administered 2016-07-18: 4 mg via INTRAVENOUS

## 2016-07-18 MED ORDER — FAMOTIDINE 20 MG PO TABS
20.0000 mg | ORAL_TABLET | Freq: Once | ORAL | Status: AC
  Administered 2016-07-18: 20 mg via ORAL

## 2016-07-18 MED ORDER — ONDANSETRON HCL 4 MG/2ML IJ SOLN
INTRAMUSCULAR | Status: DC | PRN
  Administered 2016-07-18: 4 mg via INTRAVENOUS

## 2016-07-18 MED ORDER — METHYLENE BLUE 0.5 % INJ SOLN
INTRAVENOUS | Status: DC | PRN
  Administered 2016-07-18: 5 mL via SUBMUCOSAL

## 2016-07-18 MED ORDER — HYDROCODONE-ACETAMINOPHEN 5-325 MG PO TABS: 1.0000 | ORAL_TABLET | ORAL | Status: DC | PRN

## 2016-07-18 MED ORDER — BUPIVACAINE-EPINEPHRINE (PF) 0.5% -1:200000 IJ SOLN
INTRAMUSCULAR | Status: DC | PRN
  Administered 2016-07-18: 30 mL via PERINEURAL

## 2016-07-18 MED ORDER — LACTATED RINGERS IV SOLN
INTRAVENOUS | Status: DC
  Administered 2016-07-18: 10:00:00 via INTRAVENOUS

## 2016-07-18 MED ORDER — MEPERIDINE HCL 25 MG/ML IJ SOLN: 6.2500 mg | INTRAMUSCULAR | Status: DC | PRN

## 2016-07-18 MED ORDER — OXYCODONE HCL 5 MG/5ML PO SOLN: 5.0000 mg | Freq: Once | ORAL | Status: DC | PRN

## 2016-07-18 MED ORDER — ACETAMINOPHEN 10 MG/ML IV SOLN
INTRAVENOUS | Status: DC | PRN
  Administered 2016-07-18: 1000 mg via INTRAVENOUS

## 2016-07-18 SURGICAL SUPPLY — 50 items
BANDAGE ELASTIC 6 LF NS (GAUZE/BANDAGES/DRESSINGS) ×2 IMPLANT
BLADE SURG 15 STRL SS SAFETY (BLADE) ×4 IMPLANT
BNDG GAUZE 4.5X4.1 6PLY STRL (MISCELLANEOUS) ×2 IMPLANT
BULB RESERV EVAC DRAIN JP 100C (MISCELLANEOUS) IMPLANT
CANISTER SUCT 1200ML W/VALVE (MISCELLANEOUS) ×2 IMPLANT
CHLORAPREP W/TINT 26ML (MISCELLANEOUS) ×2 IMPLANT
CNTNR SPEC 2.5X3XGRAD LEK (MISCELLANEOUS)
CONT SPEC 4OZ STER OR WHT (MISCELLANEOUS)
CONTAINER SPEC 2.5X3XGRAD LEK (MISCELLANEOUS) IMPLANT
COVER PROBE FLX POLY STRL (MISCELLANEOUS) ×2 IMPLANT
DEVICE DUBIN SPECIMEN MAMMOGRA (MISCELLANEOUS) ×2 IMPLANT
DRAIN CHANNEL JP 15F RND 16 (MISCELLANEOUS) IMPLANT
DRAPE LAPAROTOMY TRNSV 106X77 (MISCELLANEOUS) ×2 IMPLANT
DRSG TELFA 3X8 NADH (GAUZE/BANDAGES/DRESSINGS) ×4 IMPLANT
ELECT CAUTERY BLADE TIP 2.5 (TIP) ×2
ELECT REM PT RETURN 9FT ADLT (ELECTROSURGICAL) ×2
ELECTRODE CAUTERY BLDE TIP 2.5 (TIP) ×1 IMPLANT
ELECTRODE REM PT RTRN 9FT ADLT (ELECTROSURGICAL) ×1 IMPLANT
GAUZE FLUFF 18X24 1PLY STRL (GAUZE/BANDAGES/DRESSINGS) ×2 IMPLANT
GAUZE SPONGE 4X4 12PLY STRL (GAUZE/BANDAGES/DRESSINGS) IMPLANT
GLOVE BIO SURGEON STRL SZ7.5 (GLOVE) ×6 IMPLANT
GLOVE INDICATOR 8.0 STRL GRN (GLOVE) ×6 IMPLANT
GOWN STRL REUS W/ TWL LRG LVL3 (GOWN DISPOSABLE) ×2 IMPLANT
GOWN STRL REUS W/TWL LRG LVL3 (GOWN DISPOSABLE) ×2
HARMONIC SCALPEL FOCUS (MISCELLANEOUS) IMPLANT
KIT RM TURNOVER STRD PROC AR (KITS) ×2 IMPLANT
LABEL OR SOLS (LABEL) IMPLANT
MARGIN MAP 10MM (MISCELLANEOUS) ×2 IMPLANT
NDL SAFETY 22GX1.5 (NEEDLE) ×2 IMPLANT
NEEDLE HYPO 25X1 1.5 SAFETY (NEEDLE) ×2 IMPLANT
PACK BASIN MINOR ARMC (MISCELLANEOUS) ×2 IMPLANT
SHEARS FOC LG CVD HARMONIC 17C (MISCELLANEOUS) IMPLANT
SLEVE PROBE SENORX GAMMA FIND (MISCELLANEOUS) ×2 IMPLANT
STRIP CLOSURE SKIN 1/2X4 (GAUZE/BANDAGES/DRESSINGS) ×2 IMPLANT
SUT ETHILON 3-0 FS-10 30 BLK (SUTURE) ×2
SUT SILK 2 0 (SUTURE)
SUT SILK 2-0 18XBRD TIE 12 (SUTURE) IMPLANT
SUT VIC AB 2-0 CT1 27 (SUTURE) ×2
SUT VIC AB 2-0 CT1 TAPERPNT 27 (SUTURE) ×2 IMPLANT
SUT VIC AB 3-0 SH 27 (SUTURE) ×1
SUT VIC AB 3-0 SH 27X BRD (SUTURE) ×1 IMPLANT
SUT VIC AB 4-0 FS2 27 (SUTURE) ×2 IMPLANT
SUT VICRYL+ 3-0 144IN (SUTURE) ×2 IMPLANT
SUTURE EHLN 3-0 FS-10 30 BLK (SUTURE) ×1 IMPLANT
SWABSTK COMLB BENZOIN TINCTURE (MISCELLANEOUS) ×2 IMPLANT
SYR BULB IRRIG 60ML STRL (SYRINGE) ×2 IMPLANT
SYR CONTROL 10ML (SYRINGE) ×2 IMPLANT
SYRINGE 10CC LL (SYRINGE) ×2 IMPLANT
TAPE TRANSPORE STRL 2 31045 (GAUZE/BANDAGES/DRESSINGS) IMPLANT
WATER STERILE IRR 1000ML POUR (IV SOLUTION) ×2 IMPLANT

## 2016-07-18 NOTE — Anesthesia Preprocedure Evaluation (Signed)
Anesthesia Evaluation  Patient identified by MRN, date of birth, ID band Patient awake    Reviewed: Allergy & Precautions, NPO status , Patient's Chart, lab work & pertinent test results  History of Anesthesia Complications Negative for: history of anesthetic complications  Airway Mallampati: II  TM Distance: >3 FB Neck ROM: Full    Dental no notable dental hx.  Permanent bridge upper teeth:   Pulmonary neg COPD,    breath sounds clear to auscultation- rhonchi (-) wheezing      Cardiovascular Exercise Tolerance: Good (-) hypertension(-) CAD and (-) Past MI  Rhythm:Regular Rate:Normal - Systolic murmurs    Neuro/Psych negative neurological ROS  negative psych ROS   GI/Hepatic negative GI ROS, Neg liver ROS,   Endo/Other  neg diabetesHypothyroidism (does not require replacement)   Renal/GU negative Renal ROS     Musculoskeletal negative musculoskeletal ROS (+)   Abdominal (+) + obese,   Peds  Hematology negative hematology ROS (+)   Anesthesia Other Findings Past Medical History:   Radiation                                       2009           Comment:BREAST CA   Breast cancer ([place])                             2009           Comment:T1c (1.2 cm) N0 ER 90%, PR 90%, HER-2/neu not               amplified. Oncotype DX score, low risk (14) 9%               chance of recurrence with antiestrogen therapy               alone.   Skin cancer                                                  Breast cancer ([place])                             06-17-16        Comment:INVASIVE MAMMARY CARCINOMA . T1, ER positive,               PR positive, HER-2/neu not overexpressed   Heart murmur                                                 Family history of adverse reaction to anesthes*                Comment:PTS MOM-HARD TO WAKE UP   Reproductive/Obstetrics negative OB ROS                             Anesthesia  Physical Anesthesia Plan  ASA: III  Anesthesia Plan: General   Post-op Pain Management:    Induction: Intravenous  Airway Management Planned: LMA  Additional Equipment:  Intra-op Plan:   Post-operative Plan:   Informed Consent: I have reviewed the patients History and Physical, chart, labs and discussed the procedure including the risks, benefits and alternatives for the proposed anesthesia with the patient or authorized representative who has indicated his/her understanding and acceptance.   Dental advisory given  Plan Discussed with:   Anesthesia Plan Comments:         Anesthesia Quick Evaluation

## 2016-07-18 NOTE — Op Note (Signed)
Preoperative diagnosis: Right rest cancer.  Postoperative diagnosis: Same.  Operative procedure: Right breast wide excision with preoperative needle localization, mastoplasty, attempted sentinel node biopsy  Operating surgeon: Ollen Bowl, M.D.  Anesthesia: Gen. by LMA, Marcaine 0.5% with 1-200,000 epinephrine, 30 mL.  S Mehta blood loss: Less than 10 mL.  Clinical note: This 60 year old woman recently had a change in her mammogram showing a new lesion in the medial aspect of the breast. She previously undergone wide excision and accelerated partial breast radiation for lesion in the upper-outer quadrant of the same breast. She is felt to be a candidate for repeat wide excision and radiation therapy.  The patient underwent needle localization prior to the procedure.  Operative note: With the patient under adequate general anesthesia the breast excellent neck was prepped with ChloraPrep and draped. Ultrasound was used to identify the location of the localizing wire. Its location was fairly close to the skin and as plans are for accelerated partial breast radiation was elected to excise a 2 x 5 cm ellipse of skin overlying the tumor bed. This was infiltrated with local anesthetic. The skin was incised with a knife and the remaining dissection completed with electrocautery. The lock of tissue was extended down to and including the pectoralis fascia. The specimen was orientated and specimen radiograph confirmed the clip in the fair medial aspect of the wide excision specimen. Gross examination showed the margins were unremarkable. It was elected at the end of the procedure to excise an additional 1 cm band of tissue from the medial edge. This was placed onto a Telfa pad with the new medial edge actually on the pad. Localizing markers were placed and the specimen was sent in formalin for routine histology.  While the pathology report was pending attention was turned to the axilla. The patient  previously been injected with technetium sulfur colloid as well as 5 mL of 0.5% methylene blue in the subareolar plexus. Marcaine was infiltrated for postoperative analgesia. The previous incision in the axilla was opened. Fairly dense scarring was noted. Scanning through the axilla both before and after incision showed very low counts less than 22. Even with the axilla but no blue lymphatics or elevation counts were identified and no node tissue was removed. The axillary wound was closed with layers of interrupted 2-0 Vicryl figure-of-eight sutures. The skin was closed with running 4-0 Vicryl septic suture.  The breast was elevated off the underlying pectoralis muscle circumferentially for a distance of 5 cm. The deep edge was then approximated with interrupted 2-0 Vicryl figure-of-eight sutures. The deep post layer was approximate in a similar fashion. The skin was closed with a running 3-0 Vicryl septic suture. Benzoin and Steri-Strips are applied. Telfa pad, fluff gauze, Kerlix and an Ace wrap was applied.  The patient tolerated the procedure well was taken to recovery in stable condition.

## 2016-07-18 NOTE — Discharge Instructions (Signed)

## 2016-07-18 NOTE — H&P (Signed)
Second primary identified in right breast. Candidate for breast conservation. No change in clinical history. Lungs: Clear. Cardio: RR. S/P needle loc and SLN injection.

## 2016-07-18 NOTE — Transfer of Care (Signed)
Immediate Anesthesia Transfer of Care Note  Patient: Victoria Benson  Procedure(s) Performed: Procedure(s): BREAST LUMPECTOMY WITH NEEDLE LOCALIZATION AND RIGHT AXILLARY EXPLORATION (Right)  Patient Location: PACU  Anesthesia Type:General  Level of Consciousness: sedated  Airway & Oxygen Therapy: Patient Spontanous Breathing and Patient connected to face mask oxygen  Post-op Assessment: Report given to RN and Post -op Vital signs reviewed and stable  Post vital signs: Reviewed and stable  Last Vitals:  Filed Vitals:   07/18/16 1209 07/18/16 1210  BP: 129/74 129/74  Pulse: 99 108  Temp: 36.3 C   Resp:  18    Last Pain:  Filed Vitals:   07/18/16 1211  PainSc: 2          Complications: No apparent anesthesia complications

## 2016-07-18 NOTE — Anesthesia Postprocedure Evaluation (Signed)
Anesthesia Post Note  Patient: BRILYNN ROBBEN  Procedure(s) Performed: Procedure(s) (LRB): BREAST LUMPECTOMY WITH NEEDLE LOCALIZATION AND RIGHT AXILLARY EXPLORATION (Right)  Patient location during evaluation: PACU Anesthesia Type: General Level of consciousness: awake and alert and oriented Pain management: pain level controlled Vital Signs Assessment: post-procedure vital signs reviewed and stable Respiratory status: spontaneous breathing, nonlabored ventilation and respiratory function stable Cardiovascular status: blood pressure returned to baseline and stable Postop Assessment: no signs of nausea or vomiting Anesthetic complications: no    Last Vitals:  Filed Vitals:   07/18/16 1253 07/18/16 1255  BP:    Pulse: 73 71  Temp:  36.3 C  Resp: 16 15    Last Pain:  Filed Vitals:   07/18/16 1256  PainSc: 2                  Mckinnley Smithey

## 2016-07-18 NOTE — Anesthesia Procedure Notes (Signed)
Procedure Name: LMA Insertion Performed by: Lance Muss Pre-anesthesia Checklist: Patient identified, Patient being monitored, Timeout performed, Emergency Drugs available and Suction available Patient Re-evaluated:Patient Re-evaluated prior to inductionOxygen Delivery Method: Circle system utilized Preoxygenation: Pre-oxygenation with 100% oxygen Intubation Type: IV induction Ventilation: Mask ventilation without difficulty LMA: LMA inserted LMA Size: 4.0 Tube type: Oral Number of attempts: 2 Placement Confirmation: positive ETCO2 and breath sounds checked- equal and bilateral Tube secured with: Tape Dental Injury: Teeth and Oropharynx as per pre-operative assessment  Comments: Attempted first with #3 LMA, did not seat well. Second attempt with #4 LMA unique with + placement and confirmation.

## 2016-07-19 ENCOUNTER — Other Ambulatory Visit: Payer: Self-pay | Admitting: General Surgery

## 2016-07-22 LAB — SURGICAL PATHOLOGY

## 2016-07-23 ENCOUNTER — Telehealth: Payer: Self-pay | Admitting: *Deleted

## 2016-07-23 NOTE — Telephone Encounter (Signed)
Notified pathology at [facility]) that mammoprint will be ordered. Orders faxed to [place].

## 2016-07-23 NOTE — Telephone Encounter (Signed)
-----   Message from Robert Bellow, MD sent at 07/19/2016 12:02 PM EDT ----- Please have the patient's specimen sent off for Mammoprint testing.

## 2016-07-25 ENCOUNTER — Ambulatory Visit: Payer: 59 | Admitting: Occupational Therapy

## 2016-07-25 ENCOUNTER — Ambulatory Visit (INDEPENDENT_AMBULATORY_CARE_PROVIDER_SITE_OTHER): Payer: 59

## 2016-07-25 ENCOUNTER — Ambulatory Visit: Payer: 59 | Admitting: General Surgery

## 2016-07-25 ENCOUNTER — Encounter: Payer: Self-pay | Admitting: General Surgery

## 2016-07-25 VITALS — BP 164/96 | HR 72 | Resp 12 | Ht 62.0 in | Wt 201.0 lb

## 2016-07-25 DIAGNOSIS — C50911 Malignant neoplasm of unspecified site of right female breast: Secondary | ICD-10-CM

## 2016-07-25 DIAGNOSIS — M25641 Stiffness of right hand, not elsewhere classified: Secondary | ICD-10-CM | POA: Diagnosis not present

## 2016-07-25 DIAGNOSIS — R600 Localized edema: Secondary | ICD-10-CM

## 2016-07-25 DIAGNOSIS — M79644 Pain in right finger(s): Secondary | ICD-10-CM

## 2016-07-25 DIAGNOSIS — M6281 Muscle weakness (generalized): Secondary | ICD-10-CM

## 2016-07-25 NOTE — Patient Instructions (Signed)
Patient to see Dr.Chrystal.  

## 2016-07-25 NOTE — Progress Notes (Addendum)
Patient ID: Victoria Benson, female   DOB: [birth year], 60 y.o.   MRN: [id number]  Chief Complaint  Patient presents with  . Routine Post Op    right lumpectomy    HPI Victoria Benson is a 60 y.o. female here today for her post op right lumpectomy done on 07/18/16. Patient states she is doing well.  HPI  Past Medical History:  Diagnosis Date  . Breast cancer ([place]) 2009   T1c (1.2 cm) N0 ER 90%, PR 90%, HER-2/neu not amplified. Oncotype DX score, low risk (14) 9% chance of recurrence with antiestrogen therapy alone.  . Breast cancer ([place]) 06-17-16   INVASIVE MAMMARY CARCINOMA . T1, ER positive, PR positive, HER-2/neu not overexpressed  . Family history of adverse reaction to anesthesia    PTS MOM-HARD TO WAKE UP  . Heart murmur   . Radiation 2009   BREAST CA  . Skin cancer     Past Surgical History:  Procedure Laterality Date  . BREAST BIOPSY Right 06/17/2016   invasive. T1, ER positive, PR positive, HER-2/neu not overexpressed  . BREAST CYST ASPIRATION Left 2002  . BREAST LUMPECTOMY WITH NEEDLE LOCALIZATION Right 07/18/2016   Procedure: BREAST LUMPECTOMY WITH NEEDLE LOCALIZATION AND RIGHT AXILLARY EXPLORATION;  Surgeon: Robert Bellow, MD;  Location: [facility] ORS;  Service: General;  Laterality: Right;  . BREAST SURGERY Right July 2009   Wide excision, sentinel left node biopsy MammoSite. ( 06/21/2008  . CHOLECYSTECTOMY  1984  . DILATION AND CURETTAGE OF UTERUS  2004  . ENDOMETRIAL ABLATION  2005  . TUMOR REMOVAL  12/2013   from uterus done by [facility]    Family History  Problem Relation Age of Onset  . CAD Mother   . Osteoporosis Mother   . Lung cancer Father   . Diabetes Father   . Hypertension Brother   . Ovarian cancer Maternal Grandmother   . Heart attack Paternal Grandmother   . Diabetes Paternal Grandfather   . Fibromyalgia Brother   . Hypertension Brother   . Hypertension Brother   . Healthy Brother   . Breast cancer Neg Hx   . Ovarian cancer Other     Social  History Social History  Substance Use Topics  . Smoking status: Never Smoker  . Smokeless tobacco: Never Used  . Alcohol use No    No Known Allergies  Current Outpatient Prescriptions  Medication Sig Dispense Refill  . CALCIUM-MAGNESIUM-VITAMIN D ER PO Take 1 tablet by mouth daily.    Marland Kitchen loratadine (CLARITIN) 10 MG tablet Take 10 mg by mouth at bedtime.     . Multiple Vitamin (MULTIVITAMIN) capsule Take 1 capsule by mouth daily.    . Omega-3 Krill Oil 500 MG CAPS Take 750 mg by mouth daily.     Marland Kitchen venlafaxine XR (EFFEXOR XR) 37.5 MG 24 hr capsule Take 1 capsule (37.5 mg total) by mouth daily with breakfast. 30 capsule 0   No current facility-administered medications for this visit.     Review of Systems Review of Systems  Constitutional: Negative.   Respiratory: Negative.   Cardiovascular: Negative.     Blood pressure (!) 164/96, pulse 72, resp. rate 12, height 5' 2"  (1.575 m), weight 201 lb (91.2 kg).  Physical Exam Physical Exam  Constitutional: She is oriented to person, place, and time. She appears well-developed and well-nourished.  Eyes: Conjunctivae are normal. No scleral icterus.  Neck: Neck supple.  Cardiovascular: Normal rate and normal heart sounds.   Pulmonary/Chest: Effort  normal and breath sounds normal. Right breast exhibits no inverted nipple, no mass, no nipple discharge, no skin change and no tenderness. Left breast exhibits no inverted nipple, no mass, no nipple discharge, no skin change and no tenderness.    Left breast ptosis. Reasonably symmetrical volume.  Lymphadenopathy:    She has no cervical adenopathy.    She has no axillary adenopathy.  Neurological: She is alert and oriented to person, place, and time.  Skin: Skin is warm and dry.    Data Reviewed Ultrasound examination of the wide excision site in the upper inner quadrant of the right breast was undertaken to help determine whether she would be a candidate for a second MammoSite placement.  There is a very small cavity evident in the central portion of the wound about 1.1 cm below the skin. This may be difficult to use for MammoSite placement.  Notes Recorded by Robert Bellow, MD on 07/23/2016 at 8:31 AM EDT Original primary in 2009: ER: >90%; PR: > 90%, Her 2 neu: 1+.  Present tumor: ER: 90%; PR: 11-50%; Her 2 neu: 1+. ------  Notes Recorded by Robert Bellow, MD on 07/23/2016 at 8:20 AM EDT 4 mm; T1a, Nx; ER/PR; Her 2 neu not overexpressed. No SLN identified at second dissection.  Patient notified or results.         Assessment    Doing well status post wide excision, attempted sentinel node biopsy for a new primary in the right breast.      Plan    We'll obtain Mammoprint/Lutrin testing as axillary node was not identified during re-dissection.  Patient will be seeing Dr. Baruch Gouty next week to determine whether external beam confirmational therapy could be administered to the breast or if we should attempt repeat MammoSite placement.     Patient to see Dr. Baruch Gouty on 08/01/16 at 9:30 am. This information has been scribed by Gaspar Cola CMA.  Robert Bellow 07/25/2016, 12:16 PM

## 2016-07-25 NOTE — Patient Instructions (Addendum)
Same HEP - but inbetween for stiffness - do DIP /PIP strap 47min  Blocked AROM intrinsic fist  Then full fist with pencil to block MC at 90 - touch palm

## 2016-07-25 NOTE — Therapy (Signed)
[facility] [address], [place], [address] Phone: [phone]   Fax:  [phone]  Occupational Therapy Treatment  Patient Details  Name: Victoria Benson MRN: [id number] Date of Birth: 09/15/1956 Referring Provider: Mack Guise  Encounter Date: 07/25/2016      OT End of Session - 07/25/16 1123    Visit Number 5   Number of Visits 8   Date for OT Re-Evaluation 07/25/16   OT Start Time 1105   OT Stop Time 1140   OT Time Calculation (min) 35 min   Activity Tolerance Patient tolerated treatment well   Behavior During Therapy [facility] for tasks assessed/performed      Past Medical History:  Diagnosis Date  . Breast cancer ([place]) 2009   T1c (1.2 cm) N0 ER 90%, PR 90%, HER-2/neu not amplified. Oncotype DX score, low risk (14) 9% chance of recurrence with antiestrogen therapy alone.  . Breast cancer ([place]) 06-17-16   INVASIVE MAMMARY CARCINOMA . T1, ER positive, PR positive, HER-2/neu not overexpressed  . Family history of adverse reaction to anesthesia    PTS MOM-HARD TO WAKE UP  . Heart murmur   . Radiation 2009   BREAST CA  . Skin cancer     Past Surgical History:  Procedure Laterality Date  . BREAST BIOPSY Right 06/17/2016   invasive. T1, ER positive, PR positive, HER-2/neu not overexpressed  . BREAST CYST ASPIRATION Left 2002  . BREAST LUMPECTOMY WITH NEEDLE LOCALIZATION Right 07/18/2016   Procedure: BREAST LUMPECTOMY WITH NEEDLE LOCALIZATION AND RIGHT AXILLARY EXPLORATION;  Surgeon: Robert Bellow, MD;  Location: [facility] ORS;  Service: General;  Laterality: Right;  . BREAST SURGERY Right July 2009   Wide excision, sentinel left node biopsy MammoSite. ( 06/21/2008  . CHOLECYSTECTOMY  1984  . DILATION AND CURETTAGE OF UTERUS  2004  . ENDOMETRIAL ABLATION  2005  . TUMOR REMOVAL  12/2013   from uterus done by [facility]    There were no vitals filed for this visit.      Subjective Assessment - 07/25/16  1107    Subjective  Had my lumpectory - they send some of it away for test results- my finger I can get it down and heat - but then it get again stiff   Patient Stated Goals Want to get my pinkie better that I can grip the steering wheel, squeez wascloth , pruning scissors  and hold grand baby   Currently in Pain? No/denies            [facility] OT Assessment - 07/25/16 0001      Strength   Right Hand Grip (lbs) 38   Left Hand Grip (lbs) 40     Right Hand AROM   R Little PIP 0-100 76 Degrees                  OT Treatments/Exercises (OP) - 07/25/16 0001      RUE Paraffin   Number Minutes Paraffin 10 Minutes   RUE Paraffin Location Hand   Comments At [facility] to increase ROM and decreasep ain       Measurements taken at [facility] - walked in 75 after 10 reps of AROM and PROM each  Paraffin done -   Parafin  PROM  PIP/DIP stretch for flexion  Made DIP/PIP strap - 1 min x 3  Then AROM  Blocked  intrinsic fist  AROM with pencil to block MC - allow more PIP AROM  To palm   Upgrade to teal putty  for grip  5th digit flexion into small ball of putty - end range - but with pencil to block MC over flexion   but prevent triggering in 5th and 3rd - stop before triggering - not tight grip   No pain more than 1-2/10 - pull - stop - during HEP - and no sustained grip or tight - ice if needed over A1 pulley   Did do [place] at 3.3 MHZ , 20% over A 1 pulley at 1.0 intensity at 5th digit A1 pulley  at 5 min at end   Compression sleeve - new silicon sleeve fitted for night time and during day some And cont  buddy strap distal to PIP to use with some gripping activiities  Reviewed again wearing of compression and buddy strap             OT Education - 07/25/16 1123    Education provided Yes   Education Details HEP   Person(s) Educated Patient   Methods Explanation;Demonstration;Tactile cues;Verbal cues   Comprehension Verbal cues required;Verbalized  understanding;Returned demonstration          OT Short Term Goals - 07/25/16 2007      OT SHORT TERM GOAL #1   Title Pain on PRHWE improve with at least 10 points    Baseline pain on PRWHE at eval 13/50  50% of time pain  - improving    Time 2   Period Weeks   Status On-going     OT SHORT TERM GOAL #2   Title AROM for 5th diigit improve to 2 cm from [facility] to hold toothbrush    Baseline PIP to 75 degrees flexion    Status Achieved           OT Long Term Goals - 07/25/16 2008      OT LONG TERM GOAL #1   Title Edema decreaes by at least ..5 cm fo icnreaes AROM by 20 degrees    Status Achieved     OT LONG TERM GOAL #2   Title Function on PRWHE improve at least 20 points    Baseline Function score at eval 43.5/50 - improved but assess next session    Time 3   Period Weeks   Status On-going     OT LONG TERM GOAL #3   Title Grip improve by 5 lbs to hold baby, cut food and use 5th in squeezing washcloth    Baseline Grip 38 R , L 40    Status Achieved               Plan - 07/25/16 1124    Clinical Impression Statement Pt litte more stiff in 5th this date - but with little PROM and AROM improve to 75 degrees - pt HEP adjusted for 3 step ROM program to decrease stiffness inbetween HEP and to  use in end range  - stlill triggering in 3rd with tight tist    Rehab Potential Good   OT Frequency Biweekly   OT Duration 4 weeks   OT Treatment/Interventions Self-care/ADL training;Contrast Bath;Fluidtherapy;Ultrasound;Patient/family education;Splinting;Manual Therapy;Therapeutic exercise;Passive range of motion   Plan will assess her in 3 wks    OT Home Exercise Plan see pt instruction   Consulted and Agree with Plan of Care Patient      Patient will benefit from skilled therapeutic intervention in order to improve the following deficits and impairments:  Decreased coordination, Decreased range of motion, Impaired  flexibility, Increased edema, Pain, Impaired UE functional  use, Decreased strength  Visit Diagnosis: Localized edema  Stiffness of right hand, not elsewhere classified  Pain in right finger(s)  Muscle weakness (generalized)    Problem List Patient Active Problem List   Diagnosis Date Noted  . Breast cancer, female, right 07/11/2016  . Localized swelling, mass and lump, neck 05/31/2016  . Abnormal mammogram 05/29/2016  . Knee pain, left 05/14/2016  . Subclinical hypothyroidism 03/20/2016  . Allergic rhinitis 01/04/2016  . Blood in the urine 01/04/2016  . Phlebectasia 01/04/2016  . Hyperglyceridemia, pure 06/22/2009  . Avitaminosis D 05/18/2009  . Fam hx-ischem heart disease 04/05/2009  . H/O malignant neoplasm of breast 04/05/2009  . HLD (hyperlipidemia) 04/04/2009  . Adaptive colitis 04/04/2009    Rosalyn Gess OTR/L,CLT  07/25/2016, 8:10 PM  [facility] [address] [place], [address] Phone: [phone]   Fax:  [phone]  Name: BATINA DOUGAN MRN: [id number] Date of Birth: February 20, 1956

## 2016-07-30 ENCOUNTER — Telehealth: Payer: Self-pay | Admitting: General Surgery

## 2016-07-30 NOTE — Telephone Encounter (Signed)
The patient was notified that her Mammoprint test results showed her to be low risk for recurrent disease down little benefit from adjuvant systemic treatment. 97.85 year survivor with antiestrogen therapy alone.  The patient will be meeting with radiation oncology on 08/01/2016.

## 2016-08-01 ENCOUNTER — Encounter: Payer: Self-pay | Admitting: Radiation Oncology

## 2016-08-01 ENCOUNTER — Ambulatory Visit
Admission: RE | Admit: 2016-08-01 | Discharge: 2016-08-01 | Disposition: A | Discharge status: Home / Self Care | Payer: 59 | Source: Ambulatory Visit | Attending: Radiation Oncology | Admitting: Radiation Oncology

## 2016-08-01 VITALS — BP 121/78 | HR 92 | Temp 97.6°F | Resp 20 | Wt 203.0 lb

## 2016-08-01 DIAGNOSIS — Z85828 Personal history of other malignant neoplasm of skin: Secondary | ICD-10-CM | POA: Insufficient documentation

## 2016-08-01 DIAGNOSIS — C50911 Malignant neoplasm of unspecified site of right female breast: Secondary | ICD-10-CM

## 2016-08-01 DIAGNOSIS — Z8041 Family history of malignant neoplasm of ovary: Secondary | ICD-10-CM | POA: Diagnosis not present

## 2016-08-01 DIAGNOSIS — Z170 Estrogen receptor positive status [ER+]: Secondary | ICD-10-CM | POA: Diagnosis not present

## 2016-08-01 DIAGNOSIS — C50211 Malignant neoplasm of upper-inner quadrant of right female breast: Secondary | ICD-10-CM | POA: Insufficient documentation

## 2016-08-01 DIAGNOSIS — R011 Cardiac murmur, unspecified: Secondary | ICD-10-CM | POA: Insufficient documentation

## 2016-08-01 DIAGNOSIS — Z510 Encounter for antineoplastic radiation therapy: Secondary | ICD-10-CM | POA: Insufficient documentation

## 2016-08-01 DIAGNOSIS — Z79899 Other long term (current) drug therapy: Secondary | ICD-10-CM | POA: Diagnosis not present

## 2016-08-01 DIAGNOSIS — Z801 Family history of malignant neoplasm of trachea, bronchus and lung: Secondary | ICD-10-CM | POA: Insufficient documentation

## 2016-08-01 NOTE — Consult Note (Signed)
Except an outstanding is perfect of Radiation Oncology NEW PATIENT EVALUATION  Name: Victoria Benson  MRN: [id number]  Date:   08/01/2016     DOB: [birth year]   This 60 y.o. female patient presents to the clinic for initial evaluation of ipsilateral breast recurrence of invasive mammary carcinoma status post accelerated partial breast radiation in 2009.  REFERRING PHYSICIAN: Margarita Rana, MD  CHIEF COMPLAINT:  Chief Complaint  Patient presents with  . Breast Cancer    Pt is here for initial consultation of breast cancer.      DIAGNOSIS: The encounter diagnosis was Malignant neoplasm of right female breast, unspecified site of breast ([place]).   PREVIOUS INVESTIGATIONS:  Mammograms ultrasound reviewed Pathology report reviewed Previous MammoSite dosimetric records reviewed Clinical notes reviewed  HPI: Patient is a 60 year old female who received [place] and partial breast radiation to her right breast back in 2009 for a T1c 1.2 cm ER/PR positive HER-2/neu negative Oncotype DX score low invasive mammary carcinoma. She recently presented with an abnormal mammogram showing a 5 x 6 x 4 mm architectural distortion in the right breast in the upper inner quadrant confirmed on ultrasound. She went on to have a needle localization and wide local excision for a 4 mm invasive mammary carcinoma6 tight surgical margins negative at 9 mm. Tumor was ER/PR positive HER-2/neu not overexpressed. She has done well postoperatively. She is seen today for radiation oncology opinion regarding salvage treatment after accelerated partial breast irradiation. The tumor location is close to the previous lumpectomy and high-dose rate region of accelerated partial breast radiation.  PLANNED TREATMENT REGIMEN: Whole breast radiation  PAST MEDICAL HISTORY:  has a past medical history of Breast cancer ([place]) (2009); Breast cancer ([place]) (06-17-16); Family history of adverse reaction to anesthesia; Heart murmur; Radiation (2009);  and Skin cancer.    PAST SURGICAL HISTORY:  Past Surgical History:  Procedure Laterality Date  . BREAST BIOPSY Right 06/17/2016   invasive. T1, ER positive, PR positive, HER-2/neu not overexpressed  . BREAST CYST ASPIRATION Left 2002  . BREAST LUMPECTOMY WITH NEEDLE LOCALIZATION Right 07/18/2016   Procedure: BREAST LUMPECTOMY WITH NEEDLE LOCALIZATION AND RIGHT AXILLARY EXPLORATION;  Surgeon: Robert Bellow, MD;  Location: [facility] ORS;  Service: General;  Laterality: Right;  . BREAST SURGERY Right July 2009   Wide excision, sentinel left node biopsy MammoSite. ( 06/21/2008  . CHOLECYSTECTOMY  1984  . DILATION AND CURETTAGE OF UTERUS  2004  . ENDOMETRIAL ABLATION  2005  . TUMOR REMOVAL  12/2013   from uterus done by [facility]    FAMILY HISTORY: family history includes CAD in her mother; Diabetes in her father and paternal grandfather; Fibromyalgia in her brother; Healthy in her brother; Heart attack in her paternal grandmother; Hypertension in her brother, brother, and brother; Lung cancer in her father; Osteoporosis in her mother; Ovarian cancer in her maternal grandmother and other.  SOCIAL HISTORY:  reports that she has never smoked. She has never used smokeless tobacco. She reports that she does not drink alcohol or use drugs.  ALLERGIES: Review of patient's allergies indicates no known allergies.  MEDICATIONS:  Current Outpatient Prescriptions  Medication Sig Dispense Refill  . CALCIUM-MAGNESIUM-VITAMIN D ER PO Take 1 tablet by mouth daily.    Marland Kitchen loratadine (CLARITIN) 10 MG tablet Take 10 mg by mouth at bedtime.     . Multiple Vitamin (MULTIVITAMIN) capsule Take 1 capsule by mouth daily.    . Omega-3 Krill Oil 500 MG CAPS Take 750 mg by  mouth daily.     Marland Kitchen venlafaxine XR (EFFEXOR XR) 37.5 MG 24 hr capsule Take 1 capsule (37.5 mg total) by mouth daily with breakfast. (Patient not taking: Reported on 08/01/2016) 30 capsule 0   No current facility-administered medications for this  encounter.     ECOG PERFORMANCE STATUS:  0 - Asymptomatic  REVIEW OF SYSTEMS:  Patient denies any weight loss, fatigue, weakness, fever, chills or night sweats. Patient denies any loss of vision, blurred vision. Patient denies any ringing  of the ears or hearing loss. No irregular heartbeat. Patient denies heart murmur or history of fainting. Patient denies any chest pain or pain radiating to her upper extremities. Patient denies any shortness of breath, difficulty breathing at night, cough or hemoptysis. Patient denies any swelling in the lower legs. Patient denies any nausea vomiting, vomiting of blood, or coffee ground material in the vomitus. Patient denies any stomach pain. Patient states has had normal bowel movements no significant constipation or diarrhea. Patient denies any dysuria, hematuria or significant nocturia. Patient denies any problems walking, swelling in the joints or loss of balance. Patient denies any skin changes, loss of hair or loss of weight. Patient denies any excessive worrying or anxiety or significant depression. Patient denies any problems with insomnia. Patient denies excessive thirst, polyuria, polydipsia. Patient denies any swollen glands, patient denies easy bruising or easy bleeding. Patient denies any recent infections, allergies or URI. Patient "s visual fields have not changed significantly in recent time.    PHYSICAL EXAM: BP 121/78   Pulse 92   Temp 97.6 F (36.4 C)   Resp 20   Wt 203 lb 0.7 oz (92.1 kg)   BMI 37.14 kg/m  Right breast is wide local excision in the horizontal position of the right breast healing well no dominant mass or nodularity is noted in either breast in 2 positions examined. No axillary or supraclavicular adenopathy is appreciated. Well-developed well-nourished patient in NAD. HEENT reveals PERLA, EOMI, discs not visualized.  Oral cavity is clear. No oral mucosal lesions are identified. Neck is clear without evidence of cervical or  supraclavicular adenopathy. Lungs are clear to A&P. Cardiac examination is essentially unremarkable with regular rate and rhythm without murmur rub or thrill. Abdomen is benign with no organomegaly or masses noted. Motor sensory and DTR levels are equal and symmetric in the upper and lower extremities. Cranial nerves II through XII are grossly intact. Proprioception is intact. No peripheral adenopathy or edema is identified. No motor or sensory levels are noted. Crude visual fields are within normal range.  LABORATORY DATA: Pathology reports reviewed    RADIOLOGY RESULTS: Mammogram and ultrasound reviewed   IMPRESSION: T1 1 N0 M0 stage I invasive mammary carcinoma the right breast in breast previously receiving accelerated partial breast irradiation back 8 years prior  PLAN: At the present time I done a literature search.Specific data on a second MammoSite catheter placement in the same breast although there is significant data on using accelerated partial breast radiation for in breast failure after whole breast radiation. I believe based on the pattern of recurrence prior accelerated partial breast radiation I would feel safe delivering 5000 cGy over 5 weeks using external beam radiation therapy to her whole breast. Risks and benefits of treatment including skin reaction fatigue alteration of blood counts and possible chance of fat necrosis which is higher based on her previous history of of high-dose rate remote afterloading all were explained in detail to the patient and her husband. They both  seem to comprehend my treatment plan well. I have personally set her up and ordered CT simulation for early next week. Patient may be a candidate for antiestrogen therapy again and I will let medical oncology evaluate her for that.  I would like to take this opportunity to thank you for allowing me to participate in the care of your patient.Armstead Peaks., MD

## 2016-08-02 ENCOUNTER — Encounter: Payer: Self-pay | Admitting: General Surgery

## 2016-08-05 ENCOUNTER — Encounter: Payer: Self-pay | Admitting: *Deleted

## 2016-08-07 ENCOUNTER — Encounter: Payer: Self-pay | Admitting: General Surgery

## 2016-08-07 ENCOUNTER — Ambulatory Visit
Admission: RE | Admit: 2016-08-07 | Discharge: 2016-08-07 | Disposition: A | Discharge status: Home / Self Care | Payer: 59 | Source: Ambulatory Visit | Attending: Radiation Oncology | Admitting: Radiation Oncology

## 2016-08-07 DIAGNOSIS — C50211 Malignant neoplasm of upper-inner quadrant of right female breast: Secondary | ICD-10-CM | POA: Diagnosis not present

## 2016-08-09 ENCOUNTER — Other Ambulatory Visit: Payer: Self-pay | Admitting: *Deleted

## 2016-08-09 DIAGNOSIS — C50911 Malignant neoplasm of unspecified site of right female breast: Secondary | ICD-10-CM

## 2016-08-12 DIAGNOSIS — C50211 Malignant neoplasm of upper-inner quadrant of right female breast: Secondary | ICD-10-CM | POA: Diagnosis not present

## 2016-08-14 ENCOUNTER — Ambulatory Visit
Admission: RE | Admit: 2016-08-14 | Discharge: 2016-08-14 | Disposition: A | Discharge status: Home / Self Care | Payer: 59 | Source: Ambulatory Visit | Attending: Radiation Oncology | Admitting: Radiation Oncology

## 2016-08-14 ENCOUNTER — Telehealth: Payer: Self-pay | Admitting: *Deleted

## 2016-08-14 DIAGNOSIS — C50211 Malignant neoplasm of upper-inner quadrant of right female breast: Secondary | ICD-10-CM | POA: Diagnosis not present

## 2016-08-14 NOTE — Telephone Encounter (Signed)
Patient was notified as instructed and verbalized understanding.

## 2016-08-14 NOTE — Telephone Encounter (Signed)
-----  Message from Robert Bellow, MD sent at 08/14/2016  7:19 AM EDT ----- Please notify the patient that the BRCA testing was negative. She may already know this, just want to be sure.

## 2016-08-15 ENCOUNTER — Ambulatory Visit
Admission: RE | Admit: 2016-08-15 | Discharge: 2016-08-15 | Disposition: A | Discharge status: Home / Self Care | Payer: 59 | Source: Ambulatory Visit | Attending: Radiation Oncology | Admitting: Radiation Oncology

## 2016-08-15 ENCOUNTER — Ambulatory Visit: Payer: 59 | Admitting: Occupational Therapy

## 2016-08-15 DIAGNOSIS — C50211 Malignant neoplasm of upper-inner quadrant of right female breast: Secondary | ICD-10-CM | POA: Diagnosis not present

## 2016-08-15 NOTE — Progress Notes (Signed)
Order(s) created erroneously. Erroneous order ID: [id number]  Order moved by: Willy Eddy  Order move date/time: 08/15/2016 7:59 AM  Source Patient: [id number]  Source Contact: 08/07/2016  Destination Patient: [id number]  Destination Contact: 03/16/2013

## 2016-08-16 ENCOUNTER — Ambulatory Visit
Admission: RE | Admit: 2016-08-16 | Discharge: 2016-08-16 | Disposition: A | Discharge status: Home / Self Care | Payer: 59 | Source: Ambulatory Visit | Attending: Radiation Oncology | Admitting: Radiation Oncology

## 2016-08-16 DIAGNOSIS — C50211 Malignant neoplasm of upper-inner quadrant of right female breast: Secondary | ICD-10-CM | POA: Diagnosis not present

## 2016-08-19 ENCOUNTER — Ambulatory Visit
Admission: RE | Admit: 2016-08-19 | Discharge: 2016-08-19 | Disposition: A | Discharge status: Home / Self Care | Payer: 59 | Source: Ambulatory Visit | Attending: Radiation Oncology | Admitting: Radiation Oncology

## 2016-08-19 DIAGNOSIS — C50211 Malignant neoplasm of upper-inner quadrant of right female breast: Secondary | ICD-10-CM | POA: Diagnosis not present

## 2016-08-20 ENCOUNTER — Encounter: Payer: Self-pay | Admitting: General Surgery

## 2016-08-20 ENCOUNTER — Ambulatory Visit
Admission: RE | Admit: 2016-08-20 | Discharge: 2016-08-20 | Disposition: A | Discharge status: Home / Self Care | Payer: 59 | Source: Ambulatory Visit | Attending: Radiation Oncology | Admitting: Radiation Oncology

## 2016-08-20 DIAGNOSIS — C50211 Malignant neoplasm of upper-inner quadrant of right female breast: Secondary | ICD-10-CM | POA: Diagnosis not present

## 2016-08-21 ENCOUNTER — Ambulatory Visit
Admission: RE | Admit: 2016-08-21 | Discharge: 2016-08-21 | Disposition: A | Discharge status: Home / Self Care | Payer: 59 | Source: Ambulatory Visit | Attending: Radiation Oncology | Admitting: Radiation Oncology

## 2016-08-21 DIAGNOSIS — C50211 Malignant neoplasm of upper-inner quadrant of right female breast: Secondary | ICD-10-CM | POA: Diagnosis not present

## 2016-08-22 ENCOUNTER — Ambulatory Visit
Admission: RE | Admit: 2016-08-22 | Discharge: 2016-08-22 | Disposition: A | Discharge status: Home / Self Care | Payer: 59 | Source: Ambulatory Visit | Attending: Radiation Oncology | Admitting: Radiation Oncology

## 2016-08-22 DIAGNOSIS — C50211 Malignant neoplasm of upper-inner quadrant of right female breast: Secondary | ICD-10-CM | POA: Diagnosis not present

## 2016-08-23 ENCOUNTER — Ambulatory Visit
Admission: RE | Admit: 2016-08-23 | Discharge: 2016-08-23 | Disposition: A | Discharge status: Home / Self Care | Payer: 59 | Source: Ambulatory Visit | Attending: Radiation Oncology | Admitting: Radiation Oncology

## 2016-08-23 DIAGNOSIS — C50211 Malignant neoplasm of upper-inner quadrant of right female breast: Secondary | ICD-10-CM | POA: Diagnosis not present

## 2016-08-26 ENCOUNTER — Inpatient Hospital Stay: Payer: 59 | Attending: Radiation Oncology

## 2016-08-26 ENCOUNTER — Encounter: Payer: Self-pay | Admitting: General Surgery

## 2016-08-26 ENCOUNTER — Ambulatory Visit
Admission: RE | Admit: 2016-08-26 | Discharge: 2016-08-26 | Disposition: A | Discharge status: Home / Self Care | Payer: 59 | Source: Ambulatory Visit | Attending: Radiation Oncology | Admitting: Radiation Oncology

## 2016-08-26 DIAGNOSIS — Z170 Estrogen receptor positive status [ER+]: Secondary | ICD-10-CM | POA: Insufficient documentation

## 2016-08-26 DIAGNOSIS — C50911 Malignant neoplasm of unspecified site of right female breast: Secondary | ICD-10-CM | POA: Diagnosis not present

## 2016-08-26 DIAGNOSIS — C50211 Malignant neoplasm of upper-inner quadrant of right female breast: Secondary | ICD-10-CM | POA: Diagnosis not present

## 2016-08-26 LAB — CBC
HCT: 40.8 % (ref 35.0–47.0)
Hemoglobin: 14.1 g/dL (ref 12.0–16.0)
MCH: 29.8 pg (ref 26.0–34.0)
MCHC: 34.6 g/dL (ref 32.0–36.0)
MCV: 86 fL (ref 80.0–100.0)
PLATELETS: 197 10*3/uL (ref 150–440)
RBC: 4.75 MIL/uL (ref 3.80–5.20)
RDW: 13 % (ref 11.5–14.5)
WBC: 7.6 10*3/uL (ref 3.6–11.0)

## 2016-08-27 ENCOUNTER — Ambulatory Visit
Admission: RE | Admit: 2016-08-27 | Discharge: 2016-08-27 | Disposition: A | Discharge status: Home / Self Care | Payer: 59 | Source: Ambulatory Visit | Attending: Radiation Oncology | Admitting: Radiation Oncology

## 2016-08-27 DIAGNOSIS — C50211 Malignant neoplasm of upper-inner quadrant of right female breast: Secondary | ICD-10-CM | POA: Diagnosis not present

## 2016-08-28 ENCOUNTER — Ambulatory Visit
Admission: RE | Admit: 2016-08-28 | Discharge: 2016-08-28 | Disposition: A | Discharge status: Home / Self Care | Payer: 59 | Source: Ambulatory Visit | Attending: Radiation Oncology | Admitting: Radiation Oncology

## 2016-08-28 DIAGNOSIS — C50211 Malignant neoplasm of upper-inner quadrant of right female breast: Secondary | ICD-10-CM | POA: Diagnosis not present

## 2016-08-29 ENCOUNTER — Ambulatory Visit
Admission: RE | Admit: 2016-08-29 | Discharge: 2016-08-29 | Disposition: A | Discharge status: Home / Self Care | Payer: 59 | Source: Ambulatory Visit | Attending: Radiation Oncology | Admitting: Radiation Oncology

## 2016-08-29 DIAGNOSIS — C50211 Malignant neoplasm of upper-inner quadrant of right female breast: Secondary | ICD-10-CM | POA: Diagnosis not present

## 2016-08-30 ENCOUNTER — Ambulatory Visit
Admission: RE | Admit: 2016-08-30 | Discharge: 2016-08-30 | Disposition: A | Discharge status: Home / Self Care | Payer: 59 | Source: Ambulatory Visit | Attending: Radiation Oncology | Admitting: Radiation Oncology

## 2016-08-30 DIAGNOSIS — C50211 Malignant neoplasm of upper-inner quadrant of right female breast: Secondary | ICD-10-CM | POA: Diagnosis not present

## 2016-09-03 ENCOUNTER — Ambulatory Visit
Admission: RE | Admit: 2016-09-03 | Discharge: 2016-09-03 | Disposition: A | Discharge status: Home / Self Care | Payer: 59 | Source: Ambulatory Visit | Attending: Radiation Oncology | Admitting: Radiation Oncology

## 2016-09-03 DIAGNOSIS — C50211 Malignant neoplasm of upper-inner quadrant of right female breast: Secondary | ICD-10-CM | POA: Diagnosis not present

## 2016-09-04 ENCOUNTER — Ambulatory Visit
Admission: RE | Admit: 2016-09-04 | Discharge: 2016-09-04 | Disposition: A | Discharge status: Home / Self Care | Payer: 59 | Source: Ambulatory Visit | Attending: Radiation Oncology | Admitting: Radiation Oncology

## 2016-09-04 DIAGNOSIS — C50211 Malignant neoplasm of upper-inner quadrant of right female breast: Secondary | ICD-10-CM | POA: Diagnosis not present

## 2016-09-05 ENCOUNTER — Ambulatory Visit
Admission: RE | Admit: 2016-09-05 | Discharge: 2016-09-05 | Disposition: A | Discharge status: Home / Self Care | Payer: 59 | Source: Ambulatory Visit | Attending: Radiation Oncology | Admitting: Radiation Oncology

## 2016-09-05 DIAGNOSIS — C50211 Malignant neoplasm of upper-inner quadrant of right female breast: Secondary | ICD-10-CM | POA: Diagnosis not present

## 2016-09-06 ENCOUNTER — Ambulatory Visit
Admission: RE | Admit: 2016-09-06 | Discharge: 2016-09-06 | Disposition: A | Discharge status: Home / Self Care | Payer: 59 | Source: Ambulatory Visit | Attending: Radiation Oncology | Admitting: Radiation Oncology

## 2016-09-06 DIAGNOSIS — C50211 Malignant neoplasm of upper-inner quadrant of right female breast: Secondary | ICD-10-CM | POA: Diagnosis not present

## 2016-09-09 ENCOUNTER — Ambulatory Visit
Admission: RE | Admit: 2016-09-09 | Discharge: 2016-09-09 | Disposition: A | Discharge status: Home / Self Care | Payer: 59 | Source: Ambulatory Visit | Attending: Radiation Oncology | Admitting: Radiation Oncology

## 2016-09-09 ENCOUNTER — Inpatient Hospital Stay: Payer: 59 | Attending: Radiation Oncology

## 2016-09-09 DIAGNOSIS — C50911 Malignant neoplasm of unspecified site of right female breast: Secondary | ICD-10-CM

## 2016-09-09 DIAGNOSIS — C50211 Malignant neoplasm of upper-inner quadrant of right female breast: Secondary | ICD-10-CM | POA: Diagnosis not present

## 2016-09-09 LAB — CBC
HCT: 41.4 % (ref 35.0–47.0)
Hemoglobin: 14.5 g/dL (ref 12.0–16.0)
MCH: 30.2 pg (ref 26.0–34.0)
MCHC: 35 g/dL (ref 32.0–36.0)
MCV: 86.3 fL (ref 80.0–100.0)
PLATELETS: 190 10*3/uL (ref 150–440)
RBC: 4.8 MIL/uL (ref 3.80–5.20)
RDW: 13.4 % (ref 11.5–14.5)
WBC: 7.2 10*3/uL (ref 3.6–11.0)

## 2016-09-10 ENCOUNTER — Ambulatory Visit
Admission: RE | Admit: 2016-09-10 | Discharge: 2016-09-10 | Disposition: A | Discharge status: Home / Self Care | Payer: 59 | Source: Ambulatory Visit | Attending: Radiation Oncology | Admitting: Radiation Oncology

## 2016-09-10 DIAGNOSIS — C50211 Malignant neoplasm of upper-inner quadrant of right female breast: Secondary | ICD-10-CM | POA: Diagnosis not present

## 2016-09-11 ENCOUNTER — Ambulatory Visit
Admission: RE | Admit: 2016-09-11 | Discharge: 2016-09-11 | Disposition: A | Discharge status: Home / Self Care | Payer: 59 | Source: Ambulatory Visit | Attending: Radiation Oncology | Admitting: Radiation Oncology

## 2016-09-11 DIAGNOSIS — C50211 Malignant neoplasm of upper-inner quadrant of right female breast: Secondary | ICD-10-CM | POA: Diagnosis not present

## 2016-09-12 ENCOUNTER — Ambulatory Visit
Admission: RE | Admit: 2016-09-12 | Discharge: 2016-09-12 | Disposition: A | Discharge status: Home / Self Care | Payer: 59 | Source: Ambulatory Visit | Attending: Radiation Oncology | Admitting: Radiation Oncology

## 2016-09-12 DIAGNOSIS — C50211 Malignant neoplasm of upper-inner quadrant of right female breast: Secondary | ICD-10-CM | POA: Diagnosis not present

## 2016-09-13 ENCOUNTER — Ambulatory Visit
Admission: RE | Admit: 2016-09-13 | Discharge: 2016-09-13 | Disposition: A | Discharge status: Home / Self Care | Payer: 59 | Source: Ambulatory Visit | Attending: Radiation Oncology | Admitting: Radiation Oncology

## 2016-09-13 DIAGNOSIS — C50211 Malignant neoplasm of upper-inner quadrant of right female breast: Secondary | ICD-10-CM | POA: Diagnosis not present

## 2016-09-16 ENCOUNTER — Ambulatory Visit
Admission: RE | Admit: 2016-09-16 | Discharge: 2016-09-16 | Disposition: A | Discharge status: Home / Self Care | Payer: 59 | Source: Ambulatory Visit | Attending: Radiation Oncology | Admitting: Radiation Oncology

## 2016-09-16 DIAGNOSIS — C50211 Malignant neoplasm of upper-inner quadrant of right female breast: Secondary | ICD-10-CM | POA: Diagnosis not present

## 2016-09-17 ENCOUNTER — Ambulatory Visit
Admission: RE | Admit: 2016-09-17 | Discharge: 2016-09-17 | Disposition: A | Discharge status: Home / Self Care | Payer: 59 | Source: Ambulatory Visit | Attending: Radiation Oncology | Admitting: Radiation Oncology

## 2016-09-17 DIAGNOSIS — C50211 Malignant neoplasm of upper-inner quadrant of right female breast: Secondary | ICD-10-CM | POA: Diagnosis not present

## 2016-09-18 ENCOUNTER — Ambulatory Visit
Admission: RE | Admit: 2016-09-18 | Discharge: 2016-09-18 | Disposition: A | Discharge status: Home / Self Care | Payer: 59 | Source: Ambulatory Visit | Attending: Radiation Oncology | Admitting: Radiation Oncology

## 2016-09-18 DIAGNOSIS — C50211 Malignant neoplasm of upper-inner quadrant of right female breast: Secondary | ICD-10-CM | POA: Diagnosis not present

## 2016-09-19 ENCOUNTER — Ambulatory Visit
Admission: RE | Admit: 2016-09-19 | Discharge: 2016-09-19 | Disposition: A | Discharge status: Home / Self Care | Payer: 59 | Source: Ambulatory Visit | Attending: Radiation Oncology | Admitting: Radiation Oncology

## 2016-09-19 DIAGNOSIS — C50211 Malignant neoplasm of upper-inner quadrant of right female breast: Secondary | ICD-10-CM | POA: Diagnosis not present

## 2016-09-20 ENCOUNTER — Ambulatory Visit
Admission: RE | Admit: 2016-09-20 | Discharge: 2016-09-20 | Disposition: A | Discharge status: Home / Self Care | Payer: 59 | Source: Ambulatory Visit | Attending: Radiation Oncology | Admitting: Radiation Oncology

## 2016-09-20 DIAGNOSIS — C50211 Malignant neoplasm of upper-inner quadrant of right female breast: Secondary | ICD-10-CM | POA: Diagnosis not present

## 2016-09-23 ENCOUNTER — Ambulatory Visit
Admission: RE | Admit: 2016-09-23 | Discharge: 2016-09-23 | Disposition: A | Discharge status: Home / Self Care | Payer: 59 | Source: Ambulatory Visit | Attending: Radiation Oncology | Admitting: Radiation Oncology

## 2016-09-23 DIAGNOSIS — C50211 Malignant neoplasm of upper-inner quadrant of right female breast: Secondary | ICD-10-CM | POA: Diagnosis not present

## 2016-09-24 ENCOUNTER — Ambulatory Visit
Admission: RE | Admit: 2016-09-24 | Discharge: 2016-09-24 | Disposition: A | Discharge status: Home / Self Care | Payer: 59 | Source: Ambulatory Visit | Attending: Radiation Oncology | Admitting: Radiation Oncology

## 2016-09-24 DIAGNOSIS — C50211 Malignant neoplasm of upper-inner quadrant of right female breast: Secondary | ICD-10-CM | POA: Diagnosis not present

## 2016-10-21 ENCOUNTER — Ambulatory Visit: Payer: 59 | Admitting: Radiation Oncology

## 2016-11-04 ENCOUNTER — Ambulatory Visit
Admission: RE | Admit: 2016-11-04 | Discharge: 2016-11-04 | Disposition: A | Discharge status: Home / Self Care | Payer: 59 | Source: Ambulatory Visit | Attending: Radiation Oncology | Admitting: Radiation Oncology

## 2016-11-04 ENCOUNTER — Telehealth: Payer: Self-pay | Admitting: *Deleted

## 2016-11-04 ENCOUNTER — Encounter: Payer: Self-pay | Admitting: Radiation Oncology

## 2016-11-04 VITALS — BP 119/76 | HR 100 | Temp 96.7°F | Resp 20 | Wt 195.5 lb

## 2016-11-04 DIAGNOSIS — Z170 Estrogen receptor positive status [ER+]: Secondary | ICD-10-CM

## 2016-11-04 DIAGNOSIS — Z923 Personal history of irradiation: Secondary | ICD-10-CM | POA: Diagnosis not present

## 2016-11-04 DIAGNOSIS — Z853 Personal history of malignant neoplasm of breast: Secondary | ICD-10-CM | POA: Diagnosis present

## 2016-11-04 DIAGNOSIS — C50211 Malignant neoplasm of upper-inner quadrant of right female breast: Secondary | ICD-10-CM

## 2016-11-04 NOTE — Telephone Encounter (Signed)
Please arrange a follow up appt for January. Thanks.

## 2016-11-04 NOTE — Telephone Encounter (Signed)
Patient called and saw Dr.Chrystal today and he told her to let you know that she has completed her radiation.

## 2016-11-04 NOTE — Progress Notes (Signed)
Radiation Oncology Follow up Note  Name: Victoria Benson   Date:   11/04/2016 MRN:  [id number] DOB: 1956/03/11    This 60 y.o. female presents to the clinic today for follow up post treatment of locally recurrent right breast cancer.  REFERRING PROVIDER: Margarita Rana, MD  HPI: This patient initially presented in 2009 with a stage T1c 1.2cm ER/PR positive, Her2/neu negative, Oncotype Dx score low risk invasive ductal cancer. She was treated with wide local excision followed by mammosite for local therapy and had 2 years of Tamoxifen + 3 years of aromatase hormonal therapy. She was doing well and followed with regular mammograms. There was an abnormality adjacent to the tumor bed region this year and biopsy was recommended. This showed a 73m invasive ductal cancer, also ER/PR positive which was locally excised. No lymph nodes were found on exploration of the axilla and she was otherwise without evidence of disease. Adjuvant whole breast radiation was then recommended and she completed a total dose of 50.4Gy/28 fractions over 51/2 weeks on 09/24/2016. She states she has had no interval problems since then. She denies pain, nipple discharge, lumps, swelling, or other concern. Her health has been good with no cough, SOB, chest pain, GI, or GU complaints. No bone pain.She has not followed with anyone else in the interim and is not on hormonal therapy.   COMPLICATIONS OF TREATMENT: none  FOLLOW UP COMPLIANCE: keeps appointments   PHYSICAL EXAM:  BP 119/76   Pulse 100   Temp (!) 96.7 F (35.9 C)   Resp 20   Wt 195 lb 8.8 oz (88.7 kg)   BMI 35.77 kg/m  Pleasant woman in no acute distress. 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 Well nourished. Alert and oriented x3. Face symmetric, voice normal and speech appropriate. Neck is supple with no swelling. No adenopathy neck, cervical, or bilateral axillary regions. No nodularity bilateral parasternal areas.  Breast exam shows right breast surgically smaller than left. Well healed incision upper right breast. Some mild residual tanning right breast. Bilateral breasts are soft, without dominant mass or suspicious change. Lungs clear bilaterally. Heart regular rate and rhythm. Abdomen softer, nontender, no palpable organomegaly. No edema upper or lower extremities. Motor strength grossly intact. Sensation and cranial nerves grossly intact. Gait normal.  RADIOLOGY RESULTS: none recent.  PLAN: She is clinically doing well post treatment. I have advised her that she will need followup mammography with initial diagnostic studies as before. Hormonal therapy has also been discussed with her according to previous notes and she is to follow-up with her surgeon for further disscussion regarding these issues. She states she does not have osteoporosis. I have asked her to return here for follow-up with Dr. CDonella Stadein 5 months. She knows to call with any concerns in the interim.

## 2016-11-20 ENCOUNTER — Encounter: Payer: Self-pay | Admitting: *Deleted

## 2017-01-06 ENCOUNTER — Telehealth: Payer: Self-pay | Admitting: General Surgery

## 2017-01-06 DIAGNOSIS — Z1283 Encounter for screening for malignant neoplasm of skin: Secondary | ICD-10-CM | POA: Diagnosis not present

## 2017-01-06 DIAGNOSIS — D485 Neoplasm of uncertain behavior of skin: Secondary | ICD-10-CM | POA: Diagnosis not present

## 2017-01-06 DIAGNOSIS — Z85828 Personal history of other malignant neoplasm of skin: Secondary | ICD-10-CM | POA: Diagnosis not present

## 2017-01-06 NOTE — Telephone Encounter (Signed)
L/m on c# for pt to call & reschedule appt for 01-08-17 with Dr Oralia Manis to next available unless having problems.Document in appts notes rescheduled due to doctor being sick.

## 2017-01-08 ENCOUNTER — Ambulatory Visit: Payer: 59 | Admitting: General Surgery

## 2017-01-20 ENCOUNTER — Ambulatory Visit (INDEPENDENT_AMBULATORY_CARE_PROVIDER_SITE_OTHER): Payer: 59 | Admitting: General Surgery

## 2017-01-20 ENCOUNTER — Encounter: Payer: Self-pay | Admitting: General Surgery

## 2017-01-20 VITALS — BP 130/72 | HR 79 | Resp 13 | Ht 62.0 in | Wt 197.0 lb

## 2017-01-20 DIAGNOSIS — Z170 Estrogen receptor positive status [ER+]: Secondary | ICD-10-CM | POA: Diagnosis not present

## 2017-01-20 DIAGNOSIS — C50211 Malignant neoplasm of upper-inner quadrant of right female breast: Secondary | ICD-10-CM

## 2017-01-20 MED ORDER — ANASTROZOLE 1 MG PO TABS: 1.0000 mg | ORAL_TABLET | Freq: Every day | ORAL | 11 refills | Status: DC

## 2017-01-20 MED ORDER — ANASTROZOLE 1 MG PO TABS: 1.0000 mg | ORAL_TABLET | Freq: Every day | ORAL | 3 refills | Status: DC

## 2017-01-20 NOTE — Progress Notes (Signed)
Patient ID: Victoria Benson, female   DOB: [birth year], 61 y.o.   MRN: [id number]  Chief Complaint  Patient presents with  . Follow-up    breast cancer    HPI Victoria Benson is a 61 y.o. female here for a follow up for right breast cancer and right lumpectomy done on 07/18/16. She reports that she is doing well. She does have some tightness in the surgical area. She has completed her radiation treatments on 09/24/16.   HPI  Past Medical History:  Diagnosis Date  . Breast cancer ([place]) 2009   T1c (1.2 cm) N0 ER 90%, PR 90%, HER-2/neu not amplified. Oncotype DX score, low risk (14) 9% chance of recurrence with antiestrogen therapy alone.  . Breast cancer ([place]) 06-17-16   INVASIVE MAMMARY CARCINOMA . T1, ER positive, PR positive, HER-2/neu not overexpressed  . Family history of adverse reaction to anesthesia    PTS MOM-HARD TO WAKE UP  . Heart murmur   . Radiation 2009   BREAST CA  . Skin cancer     Past Surgical History:  Procedure Laterality Date  . BREAST BIOPSY Right 06/17/2016   invasive. T1, ER positive, PR positive, HER-2/neu not overexpressed  . BREAST CYST ASPIRATION Left 2002  . BREAST LUMPECTOMY WITH NEEDLE LOCALIZATION Right 07/18/2016   Procedure: BREAST LUMPECTOMY WITH NEEDLE LOCALIZATION AND RIGHT AXILLARY EXPLORATION;  Surgeon: Robert Bellow, MD;  Location: [facility] ORS;  Service: General;  Laterality: Right;  . BREAST SURGERY Right July 2009   Wide excision, sentinel left node biopsy [facility]. ( 06/21/2008  . CHOLECYSTECTOMY  1984  . DILATION AND CURETTAGE OF UTERUS  2004  . ENDOMETRIAL ABLATION  2005  . TUMOR REMOVAL  12/2013   from uterus done by [facility]    Family History  Problem Relation Age of Onset  . CAD Mother   . Osteoporosis Mother   . Lung cancer Father   . Diabetes Father   . Hypertension Brother   . Ovarian cancer Maternal Grandmother   . Heart attack Paternal Grandmother   . Diabetes Paternal Grandfather   . Fibromyalgia Brother   .  Hypertension Brother   . Hypertension Brother   . Healthy Brother   . Ovarian cancer Other   . Breast cancer Neg Hx     Social History Social History  Substance Use Topics  . Smoking status: Never Smoker  . Smokeless tobacco: Never Used  . Alcohol use No    No Known Allergies  Current Outpatient Prescriptions  Medication Sig Dispense Refill  . CALCIUM-MAGNESIUM-VITAMIN D ER PO Take 1 tablet by mouth daily.    Marland Kitchen loratadine (CLARITIN) 10 MG tablet Take 10 mg by mouth at bedtime.     . Multiple Vitamin (MULTIVITAMIN) capsule Take 1 capsule by mouth daily.    . Omega-3 Krill Oil 500 MG CAPS Take 1,000 mg by mouth daily.     Marland Kitchen anastrozole (ARIMIDEX) 1 MG tablet Take 1 tablet (1 mg total) by mouth daily. 90 tablet 3   No current facility-administered medications for this visit.     Review of Systems Review of Systems  Constitutional: Negative.   Respiratory: Negative.   Cardiovascular: Negative.     Blood pressure 130/72, pulse 79, resp. rate 13, height 5' 2"  (1.575 m), weight 197 lb (89.4 kg).  Physical Exam Physical Exam  Constitutional: She is oriented to person, place, and time. She appears well-developed and well-nourished.  Eyes: Conjunctivae are normal. No scleral icterus.  Neck:  Neck supple.  Cardiovascular: Normal rate, regular rhythm and normal heart sounds.   Pulmonary/Chest: Effort normal and breath sounds normal. Right breast exhibits no inverted nipple, no mass, no nipple discharge, no skin change and no tenderness. Left breast exhibits no inverted nipple, no mass, no nipple discharge, no skin change and no tenderness.    Lymphadenopathy:    She has no cervical adenopathy.    She has no axillary adenopathy.  Neurological: She is alert and oriented to person, place, and time.  Skin: Skin is warm and dry.  Psychiatric: She has a normal mood and affect.    Data Reviewed ER/PR positive tumor. Previously treated with tamoxifen 2 year followed by  anastrozole.  Assessment    Doing well status post repeat wide excision, attempted sentinel node biopsy and whole breast radiation.    Plan    The tightness the patient is experiencing the right chest with extension of the arm  In external rotation is related to her surgery and radiation. I anticipate this will Improve with continued use of the Upper extremity.  No clinical evidence of lymphedema.  With her estrogen positive tumor it is appropriate to resume anastrozole 1 mg daily.  We'll plan for a bone density exam in the near future,  Bilateral mammograms in May 2018.     This has been scribed by Lesly Rubenstein LPN   Robert Bellow 01/20/2017, 7:46 PM

## 2017-03-21 ENCOUNTER — Encounter: Payer: Self-pay | Admitting: Physician Assistant

## 2017-03-21 ENCOUNTER — Ambulatory Visit (INDEPENDENT_AMBULATORY_CARE_PROVIDER_SITE_OTHER): Payer: 59 | Admitting: Physician Assistant

## 2017-03-21 VITALS — BP 110/60 | HR 80 | Temp 98.4°F | Resp 16 | Ht 62.0 in | Wt 196.0 lb

## 2017-03-21 DIAGNOSIS — I83813 Varicose veins of bilateral lower extremities with pain: Secondary | ICD-10-CM

## 2017-03-21 DIAGNOSIS — E559 Vitamin D deficiency, unspecified: Secondary | ICD-10-CM | POA: Diagnosis not present

## 2017-03-21 DIAGNOSIS — Z124 Encounter for screening for malignant neoplasm of cervix: Secondary | ICD-10-CM

## 2017-03-21 DIAGNOSIS — E7800 Pure hypercholesterolemia, unspecified: Secondary | ICD-10-CM | POA: Diagnosis not present

## 2017-03-21 DIAGNOSIS — R3915 Urgency of urination: Secondary | ICD-10-CM

## 2017-03-21 DIAGNOSIS — Z1211 Encounter for screening for malignant neoplasm of colon: Secondary | ICD-10-CM | POA: Diagnosis not present

## 2017-03-21 DIAGNOSIS — Z170 Estrogen receptor positive status [ER+]: Secondary | ICD-10-CM

## 2017-03-21 DIAGNOSIS — I83892 Varicose veins of left lower extremities with other complications: Secondary | ICD-10-CM | POA: Diagnosis not present

## 2017-03-21 DIAGNOSIS — Z0000 Encounter for general adult medical examination without abnormal findings: Secondary | ICD-10-CM

## 2017-03-21 DIAGNOSIS — E781 Pure hyperglyceridemia: Secondary | ICD-10-CM | POA: Diagnosis not present

## 2017-03-21 DIAGNOSIS — C50211 Malignant neoplasm of upper-inner quadrant of right female breast: Secondary | ICD-10-CM | POA: Diagnosis not present

## 2017-03-21 DIAGNOSIS — E038 Other specified hypothyroidism: Secondary | ICD-10-CM

## 2017-03-21 DIAGNOSIS — E039 Hypothyroidism, unspecified: Secondary | ICD-10-CM

## 2017-03-21 LAB — POCT URINALYSIS DIPSTICK
Bilirubin, UA: NEGATIVE
Blood, UA: NEGATIVE
GLUCOSE UA: NEGATIVE
Ketones, UA: NEGATIVE
LEUKOCYTES UA: NEGATIVE
NITRITE UA: NEGATIVE
Protein, UA: NEGATIVE
Spec Grav, UA: 1.01 (ref 1.030–1.035)
UROBILINOGEN UA: 0.2 (ref ?–2.0)
pH, UA: 6 (ref 5.0–8.0)

## 2017-03-21 NOTE — Patient Instructions (Signed)
Health Maintenance for Postmenopausal Women Menopause is a normal process in which your reproductive ability comes to an end. This process happens gradually over a span of months to years, usually between the ages of 33 and 38. Menopause is complete when you have missed 12 consecutive menstrual periods. It is important to talk with your health care provider about some of the most common conditions that affect postmenopausal women, such as heart disease, cancer, and bone loss (osteoporosis). Adopting a healthy lifestyle and getting preventive care can help to promote your health and wellness. Those actions can also lower your chances of developing some of these common conditions. What should I know about menopause? During menopause, you may experience a number of symptoms, such as:  Moderate-to-severe hot flashes.  Night sweats.  Decrease in sex drive.  Mood swings.  Headaches.  Tiredness.  Irritability.  Memory problems.  Insomnia. Choosing to treat or not to treat menopausal changes is an individual decision that you make with your health care provider. What should I know about hormone replacement therapy and supplements? Hormone therapy products are effective for treating symptoms that are associated with menopause, such as hot flashes and night sweats. Hormone replacement carries certain risks, especially as you become older. If you are thinking about using estrogen or estrogen with progestin treatments, discuss the benefits and risks with your health care provider. What should I know about heart disease and stroke? Heart disease, heart attack, and stroke become more likely as you age. This may be due, in part, to the hormonal changes that your body experiences during menopause. These can affect how your body processes dietary fats, triglycerides, and cholesterol. Heart attack and stroke are both medical emergencies. There are many things that you can do to help prevent heart disease  and stroke:  Have your blood pressure checked at least every 1-2 years. High blood pressure causes heart disease and increases the risk of stroke.  If you are 48-61 years old, ask your health care provider if you should take aspirin to prevent a heart attack or a stroke.  Do not use any tobacco products, including cigarettes, chewing tobacco, or electronic cigarettes. If you need help quitting, ask your health care provider.  It is important to eat a healthy diet and maintain a healthy weight.  Be sure to include plenty of vegetables, fruits, low-fat dairy products, and lean protein.  Avoid eating foods that are high in solid fats, added sugars, or salt (sodium).  Get regular exercise. This is one of the most important things that you can do for your health.  Try to exercise for at least 150 minutes each week. The type of exercise that you do should increase your heart rate and make you sweat. This is known as moderate-intensity exercise.  Try to do strengthening exercises at least twice each week. Do these in addition to the moderate-intensity exercise.  Know your numbers.Ask your health care provider to check your cholesterol and your blood glucose. Continue to have your blood tested as directed by your health care provider. What should I know about cancer screening? There are several types of cancer. Take the following steps to reduce your risk and to catch any cancer development as early as possible. Breast Cancer  Practice breast self-awareness.  This means understanding how your breasts normally appear and feel.  It also means doing regular breast self-exams. Let your health care provider know about any changes, no matter how small.  If you are 40 or older,  have a clinician do a breast exam (clinical breast exam or CBE) every year. Depending on your age, family history, and medical history, it may be recommended that you also have a yearly breast X-ray (mammogram).  If you  have a family history of breast cancer, talk with your health care provider about genetic screening.  If you are at high risk for breast cancer, talk with your health care provider about having an MRI and a mammogram every year.  Breast cancer (BRCA) gene test is recommended for women who have family members with BRCA-related cancers. Results of the assessment will determine the need for genetic counseling and BRCA1 and for BRCA2 testing. BRCA-related cancers include these types:  Breast. This occurs in males or females.  Ovarian.  Tubal. This may also be called fallopian tube cancer.  Cancer of the abdominal or pelvic lining (peritoneal cancer).  Prostate.  Pancreatic. Cervical, Uterine, and Ovarian Cancer  Your health care provider may recommend that you be screened regularly for cancer of the pelvic organs. These include your ovaries, uterus, and vagina. This screening involves a pelvic exam, which includes checking for microscopic changes to the surface of your cervix (Pap test).  For women ages 21-65, health care providers may recommend a pelvic exam and a Pap test every three years. For women ages 23-65, they may recommend the Pap test and pelvic exam, combined with testing for human papilloma virus (HPV), every five years. Some types of HPV increase your risk of cervical cancer. Testing for HPV may also be done on women of any age who have unclear Pap test results.  Other health care providers may not recommend any screening for nonpregnant women who are considered low risk for pelvic cancer and have no symptoms. Ask your health care provider if a screening pelvic exam is right for you.  If you have had past treatment for cervical cancer or a condition that could lead to cancer, you need Pap tests and screening for cancer for at least 20 years after your treatment. If Pap tests have been discontinued for you, your risk factors (such as having a new sexual partner) need to be reassessed  to determine if you should start having screenings again. Some women have medical problems that increase the chance of getting cervical cancer. In these cases, your health care provider may recommend that you have screening and Pap tests more often.  If you have a family history of uterine cancer or ovarian cancer, talk with your health care provider about genetic screening.  If you have vaginal bleeding after reaching menopause, tell your health care provider.  There are currently no reliable tests available to screen for ovarian cancer. Lung Cancer  Lung cancer screening is recommended for adults 99-83 years old who are at high risk for lung cancer because of a history of smoking. A yearly low-dose CT scan of the lungs is recommended if you:  Currently smoke.  Have a history of at least 30 pack-years of smoking and you currently smoke or have quit within the past 15 years. A pack-year is smoking an average of one pack of cigarettes per day for one year. Yearly screening should:  Continue until it has been 15 years since you quit.  Stop if you develop a health problem that would prevent you from having lung cancer treatment. Colorectal Cancer  This type of cancer can be detected and can often be prevented.  Routine colorectal cancer screening usually begins at age 72 and continues  through age 75.  If you have risk factors for colon cancer, your health care provider may recommend that you be screened at an earlier age.  If you have a family history of colorectal cancer, talk with your health care provider about genetic screening.  Your health care provider may also recommend using home test kits to check for hidden blood in your stool.  A small camera at the end of a tube can be used to examine your colon directly (sigmoidoscopy or colonoscopy). This is done to check for the earliest forms of colorectal cancer.  Direct examination of the colon should be repeated every 5-10 years until  age 75. However, if early forms of precancerous polyps or small growths are found or if you have a family history or genetic risk for colorectal cancer, you may need to be screened more often. Skin Cancer  Check your skin from head to toe regularly.  Monitor any moles. Be sure to tell your health care provider:  About any new moles or changes in moles, especially if there is a change in a mole's shape or color.  If you have a mole that is larger than the size of a pencil eraser.  If any of your family members has a history of skin cancer, especially at a young age, talk with your health care provider about genetic screening.  Always use sunscreen. Apply sunscreen liberally and repeatedly throughout the day.  Whenever you are outside, protect yourself by wearing long sleeves, pants, a wide-brimmed hat, and sunglasses. What should I know about osteoporosis? Osteoporosis is a condition in which bone destruction happens more quickly than new bone creation. After menopause, you may be at an increased risk for osteoporosis. To help prevent osteoporosis or the bone fractures that can happen because of osteoporosis, the following is recommended:  If you are 19-50 years old, get at least 1,000 mg of calcium and at least 600 mg of vitamin D per day.  If you are older than age 50 but younger than age 70, get at least 1,200 mg of calcium and at least 600 mg of vitamin D per day.  If you are older than age 70, get at least 1,200 mg of calcium and at least 800 mg of vitamin D per day. Smoking and excessive alcohol intake increase the risk of osteoporosis. Eat foods that are rich in calcium and vitamin D, and do weight-bearing exercises several times each week as directed by your health care provider. What should I know about how menopause affects my mental health? Depression may occur at any age, but it is more common as you become older. Common symptoms of depression include:  Low or sad  mood.  Changes in sleep patterns.  Changes in appetite or eating patterns.  Feeling an overall lack of motivation or enjoyment of activities that you previously enjoyed.  Frequent crying spells. Talk with your health care provider if you think that you are experiencing depression. What should I know about immunizations? It is important that you get and maintain your immunizations. These include:  Tetanus, diphtheria, and pertussis (Tdap) booster vaccine.  Influenza every year before the flu season begins.  Pneumonia vaccine.  Shingles vaccine. Your health care provider may also recommend other immunizations. This information is not intended to replace advice given to you by your health care provider. Make sure you discuss any questions you have with your health care provider. Document Released: 02/07/2006 Document Revised: 07/05/2016 Document Reviewed: 09/19/2015 [employer] Interactive Patient   Education  2017 [employer]  

## 2017-03-21 NOTE — Progress Notes (Signed)
Patient: Victoria Benson, Female    DOB: 1956/04/11, 61 y.o.   MRN: [id number] Visit Date: 03/21/2017  Today's Provider: Mar Daring, PA-C   Chief Complaint  Patient presents with  . Annual Exam   Subjective:    Annual physical exam Victoria Benson is a 61 y.o. female who presents today for health maintenance and complete physical. She feels well. She reports exercising never. She reports she is sleeping fairly well due to hot flashes.  Last CPE- 03/20/2016 Last pap- 12/11/2012- Negative. HPV negative. Pt believes she had pap at [facility] in 2015 when she to be worked up for tumor. Mammograms F/B oncology Last colonoscopy- 10/06/2007. Dr. Allen Norris . Normal. Repeat 10 years. -----------------------------------------------------------------   Review of Systems  Constitutional: Negative.   HENT: Positive for rhinorrhea and tinnitus. Negative for congestion, dental problem, drooling, ear discharge, ear pain, facial swelling, hearing loss, mouth sores, nosebleeds, postnasal drip, sinus pain, sinus pressure, sneezing, sore throat, trouble swallowing and voice change.   Eyes: Negative.   Respiratory: Positive for cough. Negative for apnea, choking, chest tightness, shortness of breath, wheezing and stridor.   Cardiovascular: Positive for leg swelling. Negative for chest pain and palpitations.  Gastrointestinal: Negative.   Endocrine: Negative.   Genitourinary: Positive for enuresis and urgency. Negative for decreased urine volume, difficulty urinating, dyspareunia, dysuria, flank pain, frequency, genital sores, hematuria, menstrual problem, pelvic pain, vaginal bleeding, vaginal discharge and vaginal pain.  Musculoskeletal: Positive for arthralgias and back pain. Negative for gait problem, joint swelling, myalgias, neck pain and neck stiffness.  Skin: Negative.   Allergic/Immunologic: Negative.   Neurological: Negative.   Hematological: Negative.   Psychiatric/Behavioral: Negative.      Social History      She  reports that she has never smoked. She has never used smokeless tobacco. She reports that she does not drink alcohol or use drugs.       Social History   Social History  . Marital status: Married    Spouse name: [name]  . Number of children: 2  . Years of education: 12   Occupational History  . unemployed    Social History Main Topics  . Smoking status: Never Smoker  . Smokeless tobacco: Never Used  . Alcohol use No  . Drug use: No  . Sexual activity: Not Asked   Other Topics Concern  . None   Social History Narrative  . None    Past Medical History:  Diagnosis Date  . Breast cancer ([place]) 2009   T1c (1.2 cm) N0 ER 90%, PR 90%, HER-2/neu not amplified. Oncotype DX score, low risk (14) 9% chance of recurrence with antiestrogen therapy alone.  . Breast cancer ([place]) 06-17-16   INVASIVE MAMMARY CARCINOMA . T1, ER positive, PR positive, HER-2/neu not overexpressed  . Family history of adverse reaction to anesthesia    PTS MOM-HARD TO WAKE UP  . Heart murmur   . Radiation 2009   BREAST CA  . Skin cancer      Patient Active Problem List   Diagnosis Date Noted  . Malignant neoplasm of upper-inner quadrant of right breast in female, estrogen receptor positive ([place]) 07/11/2016  . Localized swelling, mass and lump, neck 05/31/2016  . Knee pain, left 05/14/2016  . Subclinical hypothyroidism 03/20/2016  . Allergic rhinitis 01/04/2016  . Blood in the urine 01/04/2016  . Phlebectasia 01/04/2016  . Hyperglyceridemia, pure 06/22/2009  . Avitaminosis D 05/18/2009  . Fam hx-ischem heart disease 04/05/2009  .  HLD (hyperlipidemia) 04/04/2009  . Adaptive colitis 04/04/2009    Past Surgical History:  Procedure Laterality Date  . BREAST BIOPSY Right 06/17/2016   invasive. T1, ER positive, PR positive, HER-2/neu not overexpressed  . BREAST CYST ASPIRATION Left 2002  . BREAST LUMPECTOMY WITH NEEDLE LOCALIZATION Right 07/18/2016   Procedure: BREAST  LUMPECTOMY WITH NEEDLE LOCALIZATION AND RIGHT AXILLARY EXPLORATION;  Surgeon: Robert Bellow, MD;  Location: [facility] ORS;  Service: General;  Laterality: Right;  . BREAST SURGERY Right July 2009   Wide excision, sentinel left node biopsy MammoSite. ( 06/21/2008  . CHOLECYSTECTOMY  1984  . DILATION AND CURETTAGE OF UTERUS  2004  . ENDOMETRIAL ABLATION  2005  . TUMOR REMOVAL  12/2013   from uterus done by [facility]    Family History        Family Status  Relation Status  . Mother Alive  . Father Deceased   [place]  . Brother Alive  . Maternal Grandmother Deceased at age 70   [place]  . Paternal Grandmother Deceased  . Paternal Grandfather Deceased  . Brother Alive  . Brother Alive  . Brother Alive  . Maternal Grandfather Deceased at age 61   old age  . Other   . Neg Hx         Her family history includes CAD in her mother; Diabetes in her father and paternal grandfather; Fibromyalgia in her brother; Healthy in her brother; Heart attack in her paternal grandmother; Hypertension in her brother, brother, and brother; Lung cancer in her father; Osteoporosis in her mother; Ovarian cancer in her maternal grandmother and other.     No Known Allergies   Current Outpatient Prescriptions:  .  CALCIUM-MAGNESIUM-VITAMIN D ER PO, Take 1 tablet by mouth daily., Disp: , Rfl:  .  Multiple Vitamin (MULTIVITAMIN) capsule, Take 1 capsule by mouth daily., Disp: , Rfl:  .  Omega-3 Krill Oil 500 MG CAPS, Take 1,000 mg by mouth daily. , Disp: , Rfl:  .  anastrozole (ARIMIDEX) 1 MG tablet, Take 1 tablet (1 mg total) by mouth daily. (Patient not taking: Reported on 03/21/2017), Disp: 90 tablet, Rfl: 3 .  loratadine (CLARITIN) 10 MG tablet, Take 10 mg by mouth at bedtime. , Disp: , Rfl:    Patient Care Team: Mar Daring, PA-C as PCP - General (Family Medicine) Margarita Rana, MD as Referring Physician (Family Medicine) Robert Bellow, MD (General Surgery)      Objective:   Vitals: BP 110/60  (BP Location: Left Arm, Patient Position: Sitting, Cuff Size: Large)   Pulse 80   Temp 98.4 F (36.9 C) (Oral)   Resp 16   Ht _0  (1.575 m)   Wt 196 lb (88.9 kg)   BMI 35.85 kg/m    Vitals:   03/21/17 1408  BP: 110/60  Pulse: 80  Resp: 16  Temp: 98.4 F (36.9 C)  TempSrc: Oral  Weight: 196 lb (88.9 kg)  Height: _1  (1.575 m)     Physical Exam  Constitutional: She is oriented to person, place, and time. She appears well-developed and well-nourished. No distress.  HENT:  Head: Normocephalic and atraumatic.  Right Ear: External ear normal.  Left Ear: External ear normal.  Nose: Nose normal.  Mouth/Throat: Oropharynx is clear and moist. No oropharyngeal exudate.  Eyes: Conjunctivae and EOM are normal. Pupils are equal, round, and reactive to light. Right eye exhibits no discharge. Left eye exhibits no discharge. No scleral icterus.  Neck: Normal range  of motion. Neck supple. No JVD present. No tracheal deviation present. No thyromegaly present.  Cardiovascular: Normal rate, regular rhythm, normal heart sounds and intact distal pulses.  Exam reveals no gallop and no friction rub.   No murmur heard. Pulmonary/Chest: Effort normal and breath sounds normal. No respiratory distress. She has no wheezes. She has no rales. She exhibits no tenderness.  Abdominal: Soft. Bowel sounds are normal. She exhibits no distension and no mass. There is no tenderness. There is no rebound and no guarding. Hernia confirmed negative in the right inguinal area and confirmed negative in the left inguinal area.  Genitourinary: Vagina normal and uterus normal. No labial fusion. There is no rash, tenderness, lesion or injury on the right labia. There is no rash, tenderness, lesion or injury on the left labia. Cervix exhibits no motion tenderness, no discharge and no friability. Right adnexum displays no mass, no tenderness and no fullness. Left adnexum displays no mass, no tenderness and no fullness. No  vaginal discharge found.  Musculoskeletal: Normal range of motion. She exhibits no edema or tenderness.  Lymphadenopathy:    She has no cervical adenopathy.       Right: No inguinal adenopathy present.       Left: No inguinal adenopathy present.  Neurological: She is alert and oriented to person, place, and time.  Skin: Skin is warm and dry. No rash noted. She is not diaphoretic.  Psychiatric: She has a normal mood and affect. Her behavior is normal. Judgment and thought content normal.  Vitals reviewed.    Depression Screen PHQ 2/9 Scores 03/21/2017 11/04/2016 08/01/2016 03/20/2016  PHQ - 2 Score 0 0 0 0  PHQ- 9 Score 0 - - -      Assessment & Plan:     Routine Health Maintenance and Physical Exam  Exercise Activities and Dietary recommendations Goals    None      Immunization History  Administered Date(s) Administered  . Influenza-Unspecified 12/04/2015  . Td 07/10/1999  . Tdap 04/12/2009    Health Maintenance  Topic Date Due  . HIV Screening  06/25/1972  . PAP SMEAR  12/12/2015  . COLONOSCOPY  10/05/2017  . MAMMOGRAM  05/29/2018  . TETANUS/TDAP  04/13/2019  . INFLUENZA VACCINE  Addressed  . Hepatitis C Screening  Completed     Discussed health benefits of physical activity, and encouraged her to engage in regular exercise appropriate for her age and condition.   1. Annual physical exam Normal physical exam today. Will check labs as below and f/u pending lab results. If labs are stable and WNL she will not need to have these rechecked for one year at her next annual physical exam. She is to call the office in the meantime if she has any acute issue, questions or concerns. - CBC w/Diff/Platelet - Comprehensive Metabolic Panel (CMET) - TSH  2. Colon cancer screening Due for colonoscopy. Referral placed below. - Ambulatory referral to Gastroenterology  3. Urinary urgency UA normal.  - POCT urinalysis dipstick  4. Malignant neoplasm of upper-inner quadrant  of right breast in female, estrogen receptor positive ([place]) Followed by Oncology.  5. Subclinical hypothyroidism Will check labs as below and f/u pending results. - TSH  6. Hyperglyceridemia, pure Will check labs as below and f/u pending results. - HgB A1c  7. Pure hypercholesterolemia Will check labs as below and f/u pending results. - Lipid Profile  8. Avitaminosis D Previously stable. On oral supplementation.  9. Cervical cancer screening Pap collected today.  Will send as below and f/u pending results. - Pap IG and HPV (high risk) DNA detection  10. Varicose veins of both lower extremities with pain Having increased swelling and pain with varicosities, reticular and spider veins. Advised patient to start wearing compression stockings. Elevate legs when she can. Referral made to vein clinic for further evaluation. - Ambulatory referral to Vascular Surgery  11. Varicose veins of leg with swelling, left See above medical treatment plan. - Ambulatory referral to Vascular Surgery --------------------------------------------------------------------  Patient seen and examined by Mar Daring, PA-C, and note scribed by Renaldo Fiddler, CMA.  Mar Daring, PA-C  [facility]

## 2017-03-24 ENCOUNTER — Telehealth: Payer: Self-pay

## 2017-03-24 LAB — PAP IG AND HPV HIGH-RISK
HPV, HIGH-RISK: NEGATIVE
PAP Smear Comment: 0

## 2017-03-24 NOTE — Telephone Encounter (Signed)
Notes in Referral

## 2017-03-25 DIAGNOSIS — Z0000 Encounter for general adult medical examination without abnormal findings: Secondary | ICD-10-CM | POA: Diagnosis not present

## 2017-03-25 DIAGNOSIS — E039 Hypothyroidism, unspecified: Secondary | ICD-10-CM | POA: Diagnosis not present

## 2017-03-25 DIAGNOSIS — E7800 Pure hypercholesterolemia, unspecified: Secondary | ICD-10-CM | POA: Diagnosis not present

## 2017-03-25 NOTE — Progress Notes (Signed)
Advised  ED 

## 2017-03-26 ENCOUNTER — Telehealth: Payer: Self-pay

## 2017-03-26 LAB — COMPREHENSIVE METABOLIC PANEL
ALBUMIN: 4.5 g/dL (ref 3.5–5.5)
ALK PHOS: 92 IU/L (ref 39–117)
ALT: 17 IU/L (ref 0–32)
AST: 17 IU/L (ref 0–40)
Albumin/Globulin Ratio: 2 (ref 1.2–2.2)
BILIRUBIN TOTAL: 0.7 mg/dL (ref 0.0–1.2)
BUN / CREAT RATIO: 14 (ref 9–23)
BUN: 9 mg/dL (ref 6–24)
CHLORIDE: 99 mmol/L (ref 96–106)
CO2: 28 mmol/L (ref 18–29)
Calcium: 9.4 mg/dL (ref 8.7–10.2)
Creatinine, Ser: 0.64 mg/dL (ref 0.57–1.00)
GFR calc non Af Amer: 98 mL/min/{1.73_m2} (ref >59)
GFR, EST AFRICAN AMERICAN: 113 mL/min/{1.73_m2} (ref >59)
GLOBULIN, TOTAL: 2.3 g/dL (ref 1.5–4.5)
Glucose: 84 mg/dL (ref 65–99)
Potassium: 4.6 mmol/L (ref 3.5–5.2)
SODIUM: 141 mmol/L (ref 134–144)
TOTAL PROTEIN: 6.8 g/dL (ref 6.0–8.5)

## 2017-03-26 LAB — CBC WITH DIFFERENTIAL/PLATELET
BASOS: 0 %
Basophils Absolute: 0 10*3/uL (ref 0.0–0.2)
EOS (ABSOLUTE): 0.1 10*3/uL (ref 0.0–0.4)
EOS: 1 %
HEMATOCRIT: 43.4 % (ref 34.0–46.6)
HEMOGLOBIN: 14.2 g/dL (ref 11.1–15.9)
IMMATURE GRANS (ABS): 0 10*3/uL (ref 0.0–0.1)
Immature Granulocytes: 0 %
LYMPHS ABS: 1.3 10*3/uL (ref 0.7–3.1)
Lymphs: 26 %
MCH: 28.8 pg (ref 26.6–33.0)
MCHC: 32.7 g/dL (ref 31.5–35.7)
MCV: 88 fL (ref 79–97)
MONOCYTES: 8 %
Monocytes Absolute: 0.4 10*3/uL (ref 0.1–0.9)
NEUTROS ABS: 3.2 10*3/uL (ref 1.4–7.0)
Neutrophils: 65 %
Platelets: 201 10*3/uL (ref 150–379)
RBC: 4.93 x10E6/uL (ref 3.77–5.28)
RDW: 13.7 % (ref 12.3–15.4)
WBC: 5 10*3/uL (ref 3.4–10.8)

## 2017-03-26 LAB — LIPID PANEL
CHOL/HDL RATIO: 4 ratio (ref 0.0–4.4)
Cholesterol, Total: 209 mg/dL — ABNORMAL HIGH (ref 100–199)
HDL: 52 mg/dL (ref >39)
LDL Calculated: 124 mg/dL — ABNORMAL HIGH (ref 0–99)
Triglycerides: 165 mg/dL — ABNORMAL HIGH (ref 0–149)
VLDL CHOLESTEROL CAL: 33 mg/dL (ref 5–40)

## 2017-03-26 LAB — HEMOGLOBIN A1C
ESTIMATED AVERAGE GLUCOSE: 100 mg/dL
HEMOGLOBIN A1C: 5.1 % (ref 4.8–5.6)

## 2017-03-26 LAB — TSH: TSH: 3.48 u[IU]/mL (ref 0.450–4.500)

## 2017-03-26 NOTE — Telephone Encounter (Signed)
-----   Message from Mar Daring, [place] sent at 03/26/2017  9:04 AM EDT ----- All labs are within normal limits and stable.  Cholesterol has improved from last year. Still borderline elevated but bette. Thanks! -JB

## 2017-03-26 NOTE — Telephone Encounter (Signed)
Patient advised as below.  

## 2017-04-02 ENCOUNTER — Ambulatory Visit
Admission: RE | Admit: 2017-04-02 | Discharge: 2017-04-02 | Disposition: A | Discharge status: Home / Self Care | Payer: 59 | Source: Ambulatory Visit | Attending: Radiation Oncology | Admitting: Radiation Oncology

## 2017-04-02 ENCOUNTER — Encounter: Payer: Self-pay | Admitting: Radiation Oncology

## 2017-04-02 VITALS — BP 106/70 | HR 75 | Temp 99.5°F | Resp 20 | Wt 193.2 lb

## 2017-04-02 DIAGNOSIS — Z923 Personal history of irradiation: Secondary | ICD-10-CM | POA: Diagnosis not present

## 2017-04-02 DIAGNOSIS — C50211 Malignant neoplasm of upper-inner quadrant of right female breast: Secondary | ICD-10-CM

## 2017-04-02 DIAGNOSIS — Z853 Personal history of malignant neoplasm of breast: Secondary | ICD-10-CM | POA: Diagnosis not present

## 2017-04-02 DIAGNOSIS — Z170 Estrogen receptor positive status [ER+]: Secondary | ICD-10-CM

## 2017-04-02 NOTE — Progress Notes (Signed)
Radiation Oncology Follow up Note  Name: Victoria Benson   Date:   04/02/2017 MRN:  [id number] DOB: 1956-12-24    This 61 y.o. female presents to the clinic today for six-month follow-up status post whole breast radiation to her right breast status post ipsilateral recurrence after accelerated partial breast radiation 2009.  REFERRING PROVIDER: Margarita Rana, MD  HPI: Patient is a 61 year old female now out 6 months having completed whole breast radiation to her right breast for a stage I invasive mammary carcinoma 44 mm invasive cancer with margins negative. Tumor was ER/PR positive HER-2/neu not overexpressed. She had been treated back in 2009 for a T1c ER/PR positive HER-2/neu negative right-sided breast cancer status post accelerated partial breast irradiation. She is seen today in routine follow-up and is doing well. She's not yet had a mammogram I have asked her to schedule an appointment with surgeon for evaluation and follow-up mammogram. She's also debating whether to go back on antiestrogen therapy. She specifically denies breast tenderness cough or bone pain.Marland Kitchen Her reluctance to go back on antiestrogen therapy has to do with her cholesterol levels. I've asked her to discuss that again with surgeon.  COMPLICATIONS OF TREATMENT: none  FOLLOW UP COMPLIANCE: keeps appointments   PHYSICAL EXAM:  BP 106/70   Pulse 75   Temp 99.5 F (37.5 C)   Resp 20   Wt 193 lb 3.7 oz (87.6 kg)   BMI 35.34 kg/m  Lungs are clear to A&P cardiac examination essentially unremarkable with regular rate and rhythm. No dominant mass or nodularity is noted in either breast in 2 positions examined. Incision is well-healed. No axillary or supraclavicular adenopathy is appreciated. Cosmetic result is excellent. Well-developed well-nourished patient in NAD. HEENT reveals PERLA, EOMI, discs not visualized.  Oral cavity is clear. No oral mucosal lesions are identified. Neck is clear without evidence of cervical or  supraclavicular adenopathy. Lungs are clear to A&P. Cardiac examination is essentially unremarkable with regular rate and rhythm without murmur rub or thrill. Abdomen is benign with no organomegaly or masses noted. Motor sensory and DTR levels are equal and symmetric in the upper and lower extremities. Cranial nerves II through XII are grossly intact. Proprioception is intact. No peripheral adenopathy or edema is identified. No motor or sensory levels are noted. Crude visual fields are within normal range.  RADIOLOGY RESULTS: I have asked her to schedule mammograms through her surgeon. Also have discussion with him about antiestrogen therapy.  PLAN: Present time she is doing well. She will schedule plan with follow-up with surgeon to discuss both antiestrogen therapy as well as schedule mammograms. Otherwise I'm please were overall progress. I've asked to see her back in 6 months for follow-up. She knows to call sooner with any concerns.  I would like to take this opportunity to thank you for allowing me to participate in the care of your patient.Armstead Peaks., MD

## 2017-04-09 ENCOUNTER — Other Ambulatory Visit: Payer: Self-pay

## 2017-04-09 DIAGNOSIS — Z170 Estrogen receptor positive status [ER+]: Principal | ICD-10-CM

## 2017-04-09 DIAGNOSIS — C50211 Malignant neoplasm of upper-inner quadrant of right female breast: Secondary | ICD-10-CM

## 2017-04-10 ENCOUNTER — Other Ambulatory Visit: Payer: Self-pay

## 2017-04-10 DIAGNOSIS — Z780 Asymptomatic menopausal state: Secondary | ICD-10-CM

## 2017-04-10 DIAGNOSIS — C50211 Malignant neoplasm of upper-inner quadrant of right female breast: Secondary | ICD-10-CM

## 2017-04-10 DIAGNOSIS — Z170 Estrogen receptor positive status [ER+]: Principal | ICD-10-CM

## 2017-04-21 ENCOUNTER — Ambulatory Visit (INDEPENDENT_AMBULATORY_CARE_PROVIDER_SITE_OTHER): Payer: 59 | Admitting: Vascular Surgery

## 2017-04-21 ENCOUNTER — Encounter (INDEPENDENT_AMBULATORY_CARE_PROVIDER_SITE_OTHER): Payer: Self-pay | Admitting: Vascular Surgery

## 2017-04-21 VITALS — BP 128/76 | HR 73 | Resp 16 | Ht 62.0 in | Wt 190.0 lb

## 2017-04-21 DIAGNOSIS — I83813 Varicose veins of bilateral lower extremities with pain: Secondary | ICD-10-CM | POA: Diagnosis not present

## 2017-04-21 DIAGNOSIS — E785 Hyperlipidemia, unspecified: Secondary | ICD-10-CM

## 2017-04-21 DIAGNOSIS — I872 Venous insufficiency (chronic) (peripheral): Secondary | ICD-10-CM | POA: Diagnosis not present

## 2017-04-21 DIAGNOSIS — M79606 Pain in leg, unspecified: Secondary | ICD-10-CM | POA: Insufficient documentation

## 2017-04-21 DIAGNOSIS — M79605 Pain in left leg: Secondary | ICD-10-CM

## 2017-04-21 NOTE — Progress Notes (Signed)
MRN : [id number]  Victoria Benson is a 61 y.o. (December 20, 1956) female who presents with chief complaint of  Chief Complaint  Patient presents with  . New Evaluation    Varicose veins  .  History of Present Illness: The patient is seen for evaluation of symptomatic varicose veins of the left lower extremity. The patient relates burning and stinging which worsened steadily throughout the course of the day, particularly with standing. The patient also notes an aching and throbbing pain over the varicosities, particularly with prolonged dependent positions. The symptoms are significantly improved with elevation.  The patient also notes that during hot weather the symptoms are greatly intensified. The patient states the pain from the varicose veins interferes with work, daily exercise, shopping and household maintenance. At this point, the symptoms are persistent and severe enough that they're having a negative impact on lifestyle and are interfering with daily activities.  There is no history of DVT, PE or superficial thrombophlebitis. There is no history of ulceration or hemorrhage. The patient denies a significant family history of varicose veins. OB history: P2  The patient has worn graduated compression in the past.   At the present time the patient has not been using over-the-counter analgesics. There is no history of prior surgical intervention or sclerotherapy.    Current Meds  Medication Sig  . anastrozole (ARIMIDEX) 1 MG tablet Take 1 tablet (1 mg total) by mouth daily.  Marland Kitchen CALCIUM-MAGNESIUM-VITAMIN D ER PO Take 1 tablet by mouth daily.  Marland Kitchen estrogens-methylTEST (ESTRATEST) 1.25-2.5 MG tablet Take 1 tablet by mouth daily.  Marland Kitchen loratadine (CLARITIN) 10 MG tablet Take 10 mg by mouth at bedtime.   . Multiple Vitamin (MULTIVITAMIN) capsule Take 1 capsule by mouth daily.  . Omega-3 Krill Oil 500 MG CAPS Take 1,000 mg by mouth daily.     Past Medical History:  Diagnosis Date  . Breast  cancer ([place]) 2009   T1c (1.2 cm) N0 ER 90%, PR 90%, HER-2/neu not amplified. Oncotype DX score, low risk (14) 9% chance of recurrence with antiestrogen therapy alone.  . Breast cancer ([place]) 06-17-16   INVASIVE MAMMARY CARCINOMA . T1, ER positive, PR positive, HER-2/neu not overexpressed  . Family history of adverse reaction to anesthesia    PTS MOM-HARD TO WAKE UP  . Heart murmur   . Radiation 2009   BREAST CA  . Skin cancer     Past Surgical History:  Procedure Laterality Date  . BREAST BIOPSY Right 06/17/2016   invasive. T1, ER positive, PR positive, HER-2/neu not overexpressed  . BREAST CYST ASPIRATION Left 2002  . BREAST LUMPECTOMY WITH NEEDLE LOCALIZATION Right 07/18/2016   Procedure: BREAST LUMPECTOMY WITH NEEDLE LOCALIZATION AND RIGHT AXILLARY EXPLORATION;  Surgeon: Robert Bellow, MD;  Location: [facility] ORS;  Service: General;  Laterality: Right;  . BREAST SURGERY Right July 2009   Wide excision, sentinel left node biopsy MammoSite. ( 06/21/2008  . CHOLECYSTECTOMY  1984  . DILATION AND CURETTAGE OF UTERUS  2004  . ENDOMETRIAL ABLATION  2005  . TUMOR REMOVAL  12/2013   from uterus done by [facility]    Social History Social History  Substance Use Topics  . Smoking status: Never Smoker  . Smokeless tobacco: Never Used  . Alcohol use No    Family History Family History  Problem Relation Age of Onset  . CAD Mother   . Osteoporosis Mother   . Lung cancer Father   . Diabetes Father   . Hypertension  Brother   . Ovarian cancer Maternal Grandmother   . Heart attack Paternal Grandmother   . Diabetes Paternal Grandfather   . Fibromyalgia Brother   . Hypertension Brother   . Hypertension Brother   . Healthy Brother   . Ovarian cancer Other   . Breast cancer Neg Hx     No Known Allergies   REVIEW OF SYSTEMS (Negative unless checked)  Constitutional: [] Weight loss  [] Fever  [] Chills Cardiac: [] Chest pain   [] Chest pressure   [] Palpitations   [] Shortness of breath  when laying flat   [] Shortness of breath with exertion. Vascular:  [] Pain in legs with walking   [x] Pain in legs with standing  [] History of DVT   [] Phlebitis   [x] Swelling in legs   [x] Varicose veins   [] Non-healing ulcers Pulmonary:   [] Uses home oxygen   [] Productive cough   [] Hemoptysis   [] Wheeze  [] COPD   [] Asthma Neurologic:  [] Dizziness   [] Seizures   [] History of stroke   [] History of TIA  [] Aphasia   [] Vissual changes   [] Weakness or numbness in arm   [] Weakness or numbness in leg Musculoskeletal:   [] Joint swelling   [x] Joint pain   [] Low back pain Hematologic:  [] Easy bruising  [] Easy bleeding   [] Hypercoagulable state   [] Anemic Gastrointestinal:  [] Diarrhea   [] Vomiting  [] Gastroesophageal reflux/heartburn   [] Difficulty swallowing. Genitourinary:  [] Chronic kidney disease   [] Difficult urination  [] Frequent urination   [] Blood in urine Skin:  [] Rashes   [] Ulcers  Psychological:  [] History of anxiety   []  History of major depression.  Physical Examination  Vitals:   04/21/17 1310  BP: 128/76  Pulse: 73  Resp: 16  Weight: 86.2 kg (190 lb)  Height: 5' 2"  (1.575 m)   Body mass index is 34.75 kg/m. Gen: WD/WN, NAD Head: [place]/AT, No temporalis wasting.  Ear/Nose/Throat: Hearing grossly intact, nares w/o erythema or drainage Eyes: PER, EOMI, sclera nonicteric.  Neck: Supple, no large masses.   Pulmonary:  Good air movement, no audible wheezing bilaterally, no use of accessory muscles.  Cardiac: RRR, no JVD Vascular: Large varicosities present left leg.  Mild venous stasis changes to the legs bilaterally.  2+ soft pitting edema Vessel Right Left  Radial Palpable Palpable  Ulnar Palpable Palpable  Brachial Palpable Palpable  Carotid Palpable Palpable  Femoral Palpable Palpable  Popliteal Palpable Palpable  PT Palpable Palpable  DP Palpable Palpable  Gastrointestinal: Non-distended. No guarding/no peritoneal signs.  Musculoskeletal: M/S 5/5 throughout.  No deformity or  atrophy.  Neurologic: CN 2-12 intact. Symmetrical.  Speech is fluent. Motor exam as listed above. Psychiatric: Judgment intact, Mood & affect appropriate for pt's clinical situation. Dermatologic: No rashes or ulcers noted.  No changes consistent with cellulitis. Lymph : No lichenification or skin changes of chronic lymphedema.  CBC Lab Results  Component Value Date   WBC 5.0 03/25/2017   HGB 14.5 09/09/2016   HCT 43.4 03/25/2017   MCV 88 03/25/2017   PLT 201 03/25/2017    BMET    Component Value Date/Time   NA 141 03/25/2017 0848   K 4.6 03/25/2017 0848   CL 99 03/25/2017 0848   CO2 28 03/25/2017 0848   GLUCOSE 84 03/25/2017 0848   BUN 9 03/25/2017 0848   CREATININE 0.64 03/25/2017 0848   CREATININE 0.80 09/06/2013 1029   CALCIUM 9.4 03/25/2017 0848   GFRNONAA 98 03/25/2017 0848   GFRNONAA >60 09/06/2013 1029   GFRAA 113 03/25/2017 0848   GFRAA >60 09/06/2013  1029   CrCl cannot be calculated (Patient's most recent lab result is older than the maximum 21 days allowed.).  COAG No results found for: INR, PROTIME  Radiology No results found.  Assessment/Plan 1. Varicose veins of both lower extremities with pain  Recommend:  The patient has large symptomatic varicose veins that are painful and associated with swelling.  I have had a long discussion with the patient regarding  varicose veins and why they cause symptoms.  Patient will begin wearing graduated compression stockings class 1 on a daily basis, beginning first thing in the morning and removing them in the evening. The patient is instructed specifically not to sleep in the stockings.    The patient  will also begin using over-the-counter analgesics such as Motrin 600 mg po TID to help control the symptoms.    In addition, behavioral modification including elevation during the day will be initiated.    Pending the results of these changes the  patient will be reevaluated in three months.   An  ultrasound of the  venous system will be obtained.   Further plans will be based on the ultrasound results and whether conservative therapies are successful at eliminating the pain and swelling.   2. Chronic venous insufficiency No surgery or intervention at this point in time.    I have had a long discussion with the patient regarding venous insufficiency and why it  causes symptoms. I have discussed with the patient the chronic skin changes that accompany venous insufficiency and the long term sequela such as infection and ulceration.  Patient will begin wearing graduated compression stockings class 1 (20-30 mmHg) or compression wraps on a daily basis a prescription was given. The patient will put the stockings on first thing in the morning and removing them in the evening. The patient is instructed specifically not to sleep in the stockings.    In addition, behavioral modification including several periods of elevation of the lower extremities during the day will be continued. I have demonstrated that proper elevation is a position with the ankles at heart level.  The patient is instructed to begin routine exercise, especially walking on a daily basis  Following the review of the ultrasound the patient will follow up in 2-3 months to reassess the degree of swelling and the control that graduated compression stockings or compression wraps  is offering.   The patient can be assessed for a Lymph Pump at that time  3. Pain of left lower extremity See #1&2  4. Hyperlipidemia, unspecified hyperlipidemia type Continue statin as ordered and reviewed, no changes at this time    Hortencia Pilar, MD  04/21/2017 10:02 PM

## 2017-04-28 ENCOUNTER — Ambulatory Visit (INDEPENDENT_AMBULATORY_CARE_PROVIDER_SITE_OTHER): Payer: 59

## 2017-04-28 ENCOUNTER — Ambulatory Visit (INDEPENDENT_AMBULATORY_CARE_PROVIDER_SITE_OTHER): Payer: 59 | Admitting: Vascular Surgery

## 2017-04-28 DIAGNOSIS — I83813 Varicose veins of bilateral lower extremities with pain: Secondary | ICD-10-CM

## 2017-05-28 ENCOUNTER — Telehealth (INDEPENDENT_AMBULATORY_CARE_PROVIDER_SITE_OTHER): Payer: Self-pay | Admitting: Vascular Surgery

## 2017-05-28 NOTE — Telephone Encounter (Signed)
I spoke with patient and discussed her results. Can we please make a three month appointment from the first time Dr. Delana Meyer saw her. Just an MD appointment. No studies. Thank you.

## 2017-05-28 NOTE — Telephone Encounter (Signed)
Discussed patients results from 04/28/17 venous duplex (reflux in LLE GSV). She is wearing medical grade one compression, elevating her legs and remaining active with minimal results. Patient would like to make a three month appointment to discuss possible laser ablation.

## 2017-05-28 NOTE — Telephone Encounter (Signed)
Patient called to get results from venous ultrasound on 04/28/17

## 2017-06-06 ENCOUNTER — Other Ambulatory Visit: Payer: 59

## 2017-06-09 ENCOUNTER — Ambulatory Visit
Admission: RE | Admit: 2017-06-09 | Discharge: 2017-06-09 | Disposition: A | Discharge status: Home / Self Care | Payer: 59 | Source: Ambulatory Visit | Attending: General Surgery | Admitting: General Surgery

## 2017-06-09 ENCOUNTER — Other Ambulatory Visit: Payer: 59

## 2017-06-09 DIAGNOSIS — C50211 Malignant neoplasm of upper-inner quadrant of right female breast: Secondary | ICD-10-CM

## 2017-06-09 DIAGNOSIS — M85851 Other specified disorders of bone density and structure, right thigh: Secondary | ICD-10-CM | POA: Insufficient documentation

## 2017-06-09 DIAGNOSIS — Z170 Estrogen receptor positive status [ER+]: Secondary | ICD-10-CM | POA: Diagnosis not present

## 2017-06-09 DIAGNOSIS — Z853 Personal history of malignant neoplasm of breast: Secondary | ICD-10-CM | POA: Diagnosis not present

## 2017-06-09 DIAGNOSIS — Z780 Asymptomatic menopausal state: Secondary | ICD-10-CM

## 2017-06-17 ENCOUNTER — Ambulatory Visit (INDEPENDENT_AMBULATORY_CARE_PROVIDER_SITE_OTHER): Payer: 59 | Admitting: General Surgery

## 2017-06-17 ENCOUNTER — Encounter: Payer: Self-pay | Admitting: General Surgery

## 2017-06-17 VITALS — BP 106/64 | HR 80 | Resp 12 | Ht 62.0 in | Wt 188.0 lb

## 2017-06-17 DIAGNOSIS — C50211 Malignant neoplasm of upper-inner quadrant of right female breast: Secondary | ICD-10-CM | POA: Diagnosis not present

## 2017-06-17 DIAGNOSIS — Z170 Estrogen receptor positive status [ER+]: Secondary | ICD-10-CM

## 2017-06-17 NOTE — Patient Instructions (Signed)
The patient is aware to call back for any questions or concerns.  

## 2017-06-17 NOTE — Progress Notes (Signed)
Patient ID: Victoria Benson, female   DOB: [birth year], 61 y.o.   MRN: [id number]  Chief Complaint  Patient presents with  . Follow-up    mammogram    HPI Victoria Benson is a 61 y.o. female.  who presents for her follow up breast cancer andammogram and breast evaluation. The most recent mammogram was done on 06-09-17.  Patient does perform regular self breast checks and gets regular mammograms done.   No new breast issues. Tolerating Arimidex with some hot flashes, she decided not to take the estratest.   HPI  Past Medical History:  Diagnosis Date  . BRCA gene mutation negative 06/2016  . Breast cancer ([place]) 2009   T1c (1.2 cm) N0 ER 90%, PR 90%, HER-2/neu not amplified. Oncotype DX score, low risk (14) 9% chance of recurrence with antiestrogen therapy alone.  . Breast cancer ([place]) 06-17-16   INVASIVE MAMMARY CARCINOMA . T1, ER positive, PR positive, HER-2/neu not overexpressed  . Family history of adverse reaction to anesthesia    PTS MOM-HARD TO WAKE UP  . Heart murmur   . Radiation 2009   BREAST CA  . Skin cancer     Past Surgical History:  Procedure Laterality Date  . BREAST BIOPSY Right 06/17/2016   invasive. T1, ER positive, PR positive, HER-2/neu not overexpressed  . BREAST CYST ASPIRATION Left 2002  . BREAST EXCISIONAL BIOPSY Right 06/2016   lumpectomy with rad   . BREAST EXCISIONAL BIOPSY Right 2009   breast ca +  . BREAST LUMPECTOMY WITH NEEDLE LOCALIZATION Right 07/18/2016   Procedure: BREAST LUMPECTOMY WITH NEEDLE LOCALIZATION AND RIGHT AXILLARY EXPLORATION;  Surgeon: Robert Bellow, MD;  Location: [facility] ORS;  Service: General;  Laterality: Right;  . BREAST SURGERY Right July 2009   Wide excision, sentinel left node biopsy Mammo[facility]. ( 06/21/2008  . CHOLECYSTECTOMY  1984  . DILATION AND CURETTAGE OF UTERUS  2004  . ENDOMETRIAL ABLATION  2005  . TUMOR REMOVAL  12/2013   from uterus done by [facility]    Family History  Problem Relation Age of Onset  . CAD Mother    . Osteoporosis Mother   . Lung cancer Father   . Diabetes Father   . Hypertension Brother   . Ovarian cancer Maternal Grandmother   . Heart attack Paternal Grandmother   . Diabetes Paternal Grandfather   . Fibromyalgia Brother   . Hypertension Brother   . Hypertension Brother   . Healthy Brother   . Ovarian cancer Other   . Breast cancer Neg Hx     Social History Social History  Substance Use Topics  . Smoking status: Never Smoker  . Smokeless tobacco: Never Used  . Alcohol use No    No Known Allergies  Current Outpatient Prescriptions  Medication Sig Dispense Refill  . anastrozole (ARIMIDEX) 1 MG tablet Take 1 tablet (1 mg total) by mouth daily. 90 tablet 3  . CALCIUM-MAGNESIUM-VITAMIN D ER PO Take 1 tablet by mouth daily.    Marland Kitchen loratadine (CLARITIN) 10 MG tablet Take 10 mg by mouth at bedtime.     . Multiple Vitamin (MULTIVITAMIN) capsule Take 1 capsule by mouth daily.    . Omega-3 Krill Oil 500 MG CAPS Take 1,000 mg by mouth daily.      No current facility-administered medications for this visit.     Review of Systems Review of Systems  Constitutional: Negative.   Respiratory: Negative.   Cardiovascular: Negative.     Blood pressure 106/64,  pulse 80, resp. rate 12, height _0  (1.575 m), weight 188 lb (85.3 kg).  Physical Exam Physical Exam  Constitutional: She is oriented to person, place, and time. She appears well-developed and well-nourished.  HENT:  Mouth/Throat: Oropharynx is clear and moist.  Eyes: Conjunctivae are normal. No scleral icterus.  Neck: Neck supple.  Cardiovascular: Normal rate, regular rhythm and normal heart sounds.   Pulmonary/Chest: Effort normal and breath sounds normal. Right breast exhibits no inverted nipple, no mass, no nipple discharge, no skin change and no tenderness. Left breast exhibits no inverted nipple, no mass, no nipple discharge, no skin change and no tenderness.    Well healed incision right breast. Thickening  superior and lateral to scar right breast.  Lymphadenopathy:    She has no cervical adenopathy.    She has no axillary adenopathy.  Neurological: She is alert and oriented to person, place, and time.  Skin: Skin is warm and dry.  Psychiatric: Her behavior is normal.    Data Reviewed Bilateral diagnostic mammograms dated 06/09/2017 were reviewed. Postsurgical changes on the right. BI-RADS-2. Own density exam of the same date showed normal bone density L1-L4. Mild osteopenia in the right femoral neck unchanged from 2014.  Assessment    Doing well 1 year status post conservative management of a right breast cancer.  Osteopenia of the neck, on calcium supplements.  Good tolerance of antiestrogen therapy.    Plan    Last colonoscopy was reported in 2008. Due this year.    Bone density reviewed. Patient is taking 1200 mg calcium daily.   Patient to have a bilateral diagnostic mammogram follow up in 1 year.   HPI, Physical Exam, Assessment and Plan have been scribed under the direction and in the presence of Robert Bellow, MD.  Karie Fetch, RN  I have completed the exam and reviewed the above documentation for accuracy and completeness.  I agree with the above.  Haematologist has been used and any errors in dictation or transcription are unintentional.  Hervey Ard, M.D., F.A.C.S.  Robert Bellow 06/17/2017, 7:36 PM

## 2017-06-18 ENCOUNTER — Telehealth: Payer: Self-pay | Admitting: *Deleted

## 2017-06-18 ENCOUNTER — Encounter: Payer: Self-pay | Admitting: *Deleted

## 2017-06-18 NOTE — Telephone Encounter (Signed)
-----   Message from Robert Bellow, MD sent at 06/17/2017  7:38 PM EDT ----- It looks like the patient's last colonoscopy was 2008. If this is correct she's do this year. It looks like [facility] [employer] completed the study.  Confirm she is due and remind her to follow up.

## 2017-06-18 NOTE — Telephone Encounter (Signed)
Left message to check MyChart or return call if possible.

## 2017-06-26 ENCOUNTER — Encounter (INDEPENDENT_AMBULATORY_CARE_PROVIDER_SITE_OTHER): Payer: 59

## 2017-06-26 ENCOUNTER — Ambulatory Visit (INDEPENDENT_AMBULATORY_CARE_PROVIDER_SITE_OTHER): Payer: 59 | Admitting: Vascular Surgery

## 2017-09-09 DIAGNOSIS — M222X2 Patellofemoral disorders, left knee: Secondary | ICD-10-CM | POA: Diagnosis not present

## 2017-09-09 DIAGNOSIS — M25562 Pain in left knee: Secondary | ICD-10-CM | POA: Diagnosis not present

## 2017-10-08 ENCOUNTER — Ambulatory Visit
Admission: RE | Admit: 2017-10-08 | Discharge: 2017-10-08 | Disposition: A | Discharge status: Home / Self Care | Payer: 59 | Source: Ambulatory Visit | Attending: Radiation Oncology | Admitting: Radiation Oncology

## 2017-10-08 VITALS — BP 113/70 | HR 76 | Temp 98.0°F | Resp 18 | Wt 186.6 lb

## 2017-10-08 DIAGNOSIS — Z923 Personal history of irradiation: Secondary | ICD-10-CM | POA: Insufficient documentation

## 2017-10-08 DIAGNOSIS — Z79811 Long term (current) use of aromatase inhibitors: Secondary | ICD-10-CM | POA: Insufficient documentation

## 2017-10-08 DIAGNOSIS — Z170 Estrogen receptor positive status [ER+]: Secondary | ICD-10-CM | POA: Diagnosis not present

## 2017-10-08 DIAGNOSIS — C50211 Malignant neoplasm of upper-inner quadrant of right female breast: Secondary | ICD-10-CM | POA: Insufficient documentation

## 2017-10-08 NOTE — Progress Notes (Signed)
Radiation Oncology Follow up Note  Name: Victoria Benson   Date:   10/08/2017 MRN:  [id number] DOB: [birth year]    This 61 y.o. female presents to the clinic today for 1 year follow-up status post whole breast radiation to her right breast status post ipsilateral recurrence after accelerated partial breast irradiation in 2009 for invasive mammary carcinoma.Marland Kitchen  REFERRING PROVIDER: Florian Buff*  HPI: patient is a 61 year old female now seen out 1 year having completed whole breast radiation to her right breast for stage I invasive carcinoma. Tumor was ER/PR positive HER-2/neu not overexpressed. She had been treated back in 2009 for a T1 ER/PR positive right-sided breast cancer status post accelerated partial breast irradiation. She is seen today in routine follow-up and is doing well. She specifically denies breast tenderness cough or bone pain..mammograms from June which I have reviewed were BI-RADS 2. She is currently on arimadex tolerate that well without side effect. She specifically denies any do lower urinary tract symptoms or diarrhea.  COMPLICATIONS OF TREATMENT: none  FOLLOW UP COMPLIANCE: keeps appointments   PHYSICAL EXAM:  BP 113/70   Pulse 76   Temp 98 F (36.7 C)   Resp 18   Wt 186 lb 9.9 oz (84.6 kg)   BMI 34.13 kg/m  Lungs are clear to A&P cardiac examination essentially unremarkable with regular rate and rhythm. No dominant mass or nodularity is noted in either breast in 2 positions examined. Incision is well-healed. No axillary or supraclavicular adenopathy is appreciated. Cosmetic result is good. She does have significant difference in size of the 2 breasts although cosmetic result still is fair to good. Well-developed well-nourished patient in NAD. HEENT reveals PERLA, EOMI, discs not visualized.  Oral cavity is clear. No oral mucosal lesions are identified. Neck is clear without evidence of cervical or supraclavicular adenopathy. Lungs are clear to A&P. Cardiac  examination is essentially unremarkable with regular rate and rhythm without murmur rub or thrill. Abdomen is benign with no organomegaly or masses noted. Motor sensory and DTR levels are equal and symmetric in the upper and lower extremities. Cranial nerves II through XII are grossly intact. Proprioception is intact. No peripheral adenopathy or edema is identified. No motor or sensory levels are noted. Crude visual fields are within normal range.  RADIOLOGY RESULTS: mammograms are reviewed and compatible with the above-stated findings  PLAN: present time patient continues to do well with no evidence of disease. I'm please with her overall progress. She or he has scheduled follow-up mammograms next June. She continues on aromatase without side effect. I have asked to see her back in 1 year for follow-up. She knows to call sooner with any concerns.  I would like to take this opportunity to thank you for allowing me to participate in the care of your patient.Armstead Peaks., MD

## 2017-11-17 ENCOUNTER — Encounter: Payer: Self-pay | Admitting: Family Medicine

## 2017-11-17 ENCOUNTER — Ambulatory Visit (INDEPENDENT_AMBULATORY_CARE_PROVIDER_SITE_OTHER): Payer: 59 | Admitting: Family Medicine

## 2017-11-17 VITALS — BP 116/76 | HR 88 | Temp 98.2°F | Resp 16 | Wt 187.0 lb

## 2017-11-17 DIAGNOSIS — H6983 Other specified disorders of Eustachian tube, bilateral: Secondary | ICD-10-CM

## 2017-11-17 DIAGNOSIS — B9689 Other specified bacterial agents as the cause of diseases classified elsewhere: Secondary | ICD-10-CM

## 2017-11-17 DIAGNOSIS — J029 Acute pharyngitis, unspecified: Secondary | ICD-10-CM

## 2017-11-17 DIAGNOSIS — J038 Acute tonsillitis due to other specified organisms: Secondary | ICD-10-CM

## 2017-11-17 LAB — POCT RAPID STREP A (OFFICE): RAPID STREP A SCREEN: NEGATIVE

## 2017-11-17 MED ORDER — AMOXICILLIN-POT CLAVULANATE 875-125 MG PO TABS: 1.0000 | ORAL_TABLET | Freq: Two times a day (BID) | ORAL | 0 refills | Status: AC

## 2017-11-17 MED ORDER — FLUTICASONE PROPIONATE 50 MCG/ACT NA SUSP: 2.0000 | Freq: Every day | NASAL | 6 refills | Status: DC

## 2017-11-17 NOTE — Patient Instructions (Signed)
Tonsillitis Tonsillitis is an infection of the throat that causes the tonsils to become red, tender, and swollen. Tonsils are collections of lymphoid tissue at the back of the throat. Each tonsil has crevices (crypts). Tonsils help fight nose and throat infections and keep infection from spreading to other parts of the body for the first 18 months of life. What are the causes? Sudden (acute) tonsillitis is usually caused by infection with streptococcal bacteria. Long-lasting (chronic) tonsillitis occurs when the crypts of the tonsils become filled with pieces of food and bacteria, which makes it easy for the tonsils to become repeatedly infected. What are the signs or symptoms? Symptoms of tonsillitis include:  A sore throat, with possible difficulty swallowing.  White patches on the tonsils.  Fever.  Tiredness.  New episodes of snoring during sleep, when you did not snore before.  Small, foul-smelling, yellowish-white pieces of material (tonsilloliths) that you occasionally cough up or spit out. The tonsilloliths can also cause you to have bad breath.  How is this diagnosed? Tonsillitis can be diagnosed through a physical exam. Diagnosis can be confirmed with the results of lab tests, including a throat culture. How is this treated? The goals of tonsillitis treatment include the reduction of the severity and duration of symptoms and prevention of associated conditions. Symptoms of tonsillitis can be improved with the use of steroids to reduce the swelling. Tonsillitis caused by bacteria can be treated with antibiotic medicines. Usually, treatment with antibiotic medicines is started before the cause of the tonsillitis is known. However, if it is determined that the cause is not bacterial, antibiotic medicines will not treat the tonsillitis. If attacks of tonsillitis are severe and frequent, your health care provider may recommend surgery to remove the tonsils (tonsillectomy). Follow these  instructions at home:  Rest as much as possible and get plenty of sleep.  Drink plenty of fluids. While the throat is very sore, eat soft foods or liquids, such as sherbet, soups, or instant breakfast drinks.  Eat frozen ice pops.  Gargle with a warm or cold liquid to help soothe the throat. Mix 1/4 teaspoon of salt and 1/4 teaspoon of baking soda in 8 oz of water. Contact a health care provider if:  Large, tender lumps develop in your neck.  A rash develops.  A green, yellow-brown, or bloody substance is coughed up.  You are unable to swallow liquids or food for 24 hours.  You notice that only one of the tonsils is swollen. Get help right away if:  You develop any new symptoms such as vomiting, severe headache, stiff neck, chest pain, or trouble breathing or swallowing.  You have severe throat pain along with drooling or voice changes.  You have severe pain, unrelieved with recommended medications.  You are unable to fully open the mouth.  You develop redness, swelling, or severe pain anywhere in the neck.  You have a fever. This information is not intended to replace advice given to you by your health care provider. Make sure you discuss any questions you have with your health care provider. Document Released: 09/25/2005 Document Revised: 05/23/2016 Document Reviewed: 06/04/2013 Elsevier Interactive Patient Education  2017 Elsevier Inc.  

## 2017-11-17 NOTE — Progress Notes (Signed)
Patient: Victoria Benson Female    DOB: [birth year]   61 y.o.   MRN: [id number] Visit Date: 11/17/2017  Today's Provider: Lavon Paganini, MD   Chief Complaint  Patient presents with  . Sore Throat   Subjective:    Sore Throat   This is a new problem. Episode onset: x 4.5 weeks. Progression since onset: was improving, but sx have since worsened last week. The pain is worse on the right side. The maximum temperature recorded prior to her arrival was 100.4 - 100.9 F. The pain is at a severity of 2/10. The pain is mild. Associated symptoms include abdominal pain, congestion, coughing (some), ear pain, headaches, a plugged ear sensation, swollen glands and trouble swallowing. Pertinent negatives include no diarrhea, ear discharge, hoarse voice, neck pain, shortness of breath or vomiting. She has had no exposure to strep. She has tried acetaminophen for the symptoms. The treatment provided moderate relief.   Continues to have postnasal drip.  Grandchildren have had similar illnesses.  Fever just started in last few days. Sore throat started last week. Saltwater gargles have not helped.  Also reports long-standing nasal congestion, b/l ear congestion, muffled hearing, and popping.  Has not tried any medicaitons or nasal sprays.   No Known Allergies   Current Outpatient Medications:  .  anastrozole (ARIMIDEX) 1 MG tablet, Take 1 tablet (1 mg total) by mouth daily., Disp: 90 tablet, Rfl: 3 .  CALCIUM-MAGNESIUM-VITAMIN D ER PO, Take 1 tablet by mouth daily., Disp: , Rfl:  .  cetirizine (ZYRTEC) 10 MG tablet, Take 10 mg daily by mouth., Disp: , Rfl:  .  Multiple Vitamin (MULTIVITAMIN) capsule, Take 1 capsule by mouth daily., Disp: , Rfl:  .  loratadine (CLARITIN) 10 MG tablet, Take 10 mg by mouth at bedtime. , Disp: , Rfl:  .  Omega-3 Krill Oil 500 MG CAPS, Take 1,000 mg by mouth daily. , Disp: , Rfl:   Review of Systems  HENT: Positive for congestion, ear pain and trouble swallowing.  Negative for ear discharge and hoarse voice.   Respiratory: Positive for cough (some). Negative for shortness of breath.   Gastrointestinal: Positive for abdominal pain. Negative for diarrhea and vomiting.  Musculoskeletal: Negative for neck pain.  Neurological: Positive for headaches.    Social History   Tobacco Use  . Smoking status: Never Smoker  . Smokeless tobacco: Never Used  Substance Use Topics  . Alcohol use: No   Objective:   BP 116/76 (BP Location: Left Arm, Patient Position: Sitting, Cuff Size: Normal)   Pulse 88   Temp 98.2 F (36.8 C) (Oral)   Resp 16   Wt 187 lb (84.8 kg)   BMI 34.20 kg/m  Vitals:   11/17/17 1335  BP: 116/76  Pulse: 88  Resp: 16  Temp: 98.2 F (36.8 C)  TempSrc: Oral  Weight: 187 lb (84.8 kg)     Physical Exam  Constitutional: She is oriented to person, place, and time. She appears well-developed and well-nourished. No distress.  HENT:  Head: Normocephalic and atraumatic.  Right Ear: Tympanic membrane, external ear and ear canal normal.  Left Ear: Tympanic membrane, external ear and ear canal normal.  Nose: Nose normal.  Mouth/Throat: Uvula is midline and mucous membranes are normal. No uvula swelling. Oropharyngeal exudate present.  R tonsil swollen, erythematous, with exudate. No discreet abscess noted  Eyes: Conjunctivae are normal. Pupils are equal, round, and reactive to light. No scleral icterus.  Neck: Neck supple. No  tracheal deviation present. No thyromegaly present.  Cardiovascular: Normal rate, regular rhythm and normal heart sounds.  No murmur heard. Pulmonary/Chest: Effort normal and breath sounds normal. No respiratory distress. She has no wheezes. She has no rales.  Musculoskeletal: She exhibits no edema.  Neurological: She is alert and oriented to person, place, and time.  Skin: Skin is warm and dry. No rash noted.  Psychiatric: She has a normal mood and affect. Her behavior is normal.  Vitals reviewed.         Assessment & Plan:     1. Acute bacterial tonsillitis - concern for bacterial infection given acute worsening and new fevers - R sided erythema and exudate without abscess - treat with 10 day course of augmentin - no need for imaging or drainage at this time as there is no trismus, muffled voice, etc concerning for PTA - Ambulatory referral to ENT  2. Sore throat - see above - POCT rapid strep A - negative  3. Dysfunction of both eustachian tubes - discussed using flonase BID to help with full feeling - wants to see ENT to discuss as well - Ambulatory referral to ENT  Return if symptoms worsen or fail to improve.     The entirety of the information documented in the History of Present Illness, Review of Systems and Physical Exam were personally obtained by me. Portions of this information were initially documented by Raquel Sarna Ratchford, CMA and reviewed by me for thoroughness and accuracy.     Lavon Paganini, MD  [facility]

## 2017-11-29 DIAGNOSIS — C8330 Diffuse large B-cell lymphoma, unspecified site: Secondary | ICD-10-CM

## 2017-11-29 HISTORY — DX: Diffuse large B-cell lymphoma, unspecified site: C83.30

## 2017-12-02 ENCOUNTER — Telehealth: Payer: Self-pay | Admitting: Physician Assistant

## 2017-12-02 NOTE — Telephone Encounter (Signed)
Pt states she was seen on 11/17/17 for sore throat.  Pt states she now has a knot on the right side of her neck.  Pt is requesting a call back to discuss.  CB#[phone]/MW

## 2017-12-02 NOTE — Telephone Encounter (Signed)
Please review

## 2017-12-03 NOTE — Telephone Encounter (Signed)
Appointment scheduled for 0945 tomorrow.

## 2017-12-03 NOTE — Telephone Encounter (Signed)
Pt called back because she hasn't heard anything about the message she left yesterday. Pt wanted to know if she should schedule another appt. Please advise. Thanks TNP

## 2017-12-03 NOTE — Telephone Encounter (Signed)
Should she be seen?

## 2017-12-03 NOTE — Telephone Encounter (Signed)
Yeah.  Let's get her in for evaluation of this neck lump.  Virginia Crews, MD, MPH [facility] 12/03/2017 3:20 PM

## 2017-12-04 ENCOUNTER — Ambulatory Visit (INDEPENDENT_AMBULATORY_CARE_PROVIDER_SITE_OTHER): Payer: 59 | Admitting: Family Medicine

## 2017-12-04 ENCOUNTER — Other Ambulatory Visit: Payer: Self-pay | Admitting: Otolaryngology

## 2017-12-04 VITALS — BP 116/64 | HR 94 | Temp 98.3°F | Resp 16 | Wt 184.0 lb

## 2017-12-04 DIAGNOSIS — R221 Localized swelling, mass and lump, neck: Secondary | ICD-10-CM

## 2017-12-04 DIAGNOSIS — J351 Hypertrophy of tonsils: Secondary | ICD-10-CM | POA: Diagnosis not present

## 2017-12-04 DIAGNOSIS — J36 Peritonsillar abscess: Secondary | ICD-10-CM

## 2017-12-04 DIAGNOSIS — I889 Nonspecific lymphadenitis, unspecified: Secondary | ICD-10-CM

## 2017-12-04 DIAGNOSIS — L040 Acute lymphadenitis of face, head and neck: Secondary | ICD-10-CM | POA: Diagnosis not present

## 2017-12-04 NOTE — Patient Instructions (Signed)
Go to Dr. Darien Ramus office today at 1pm to be seen this afternoon.

## 2017-12-04 NOTE — Progress Notes (Signed)
Patient: Victoria Benson Female    DOB: March 13, 1956   61 y.o.   MRN: [id number] Visit Date: 12/04/2017  Today's Provider: Lavon Paganini, MD   Chief Complaint  Patient presents with  . Adenopathy   Subjective:     I, Victoria Benson, CMA, am acting as scribe for Lavon Paganini, MD.   HPI   Pt was seen 11/17/2017 for acute bacterial tonsillitis. She was prescribed Augmentin x10days. She states she noticed right sided neck swelling 3 days ago. She finished the abx one week ago and took entire course as prescribed. She states her tonsil is still swollen. She is c/o neck tenderness on R side over area of swelling. States this was tender and red for the last 3 days.  Her throat is no longer sore. She is scheduled to see [facility] on December 19th.  She denies any SOB, difficulty swallowing, fevers, night sweats.  No Known Allergies   Current Outpatient Medications:  .  anastrozole (ARIMIDEX) 1 MG tablet, Take 1 tablet (1 mg total) by mouth daily., Disp: 90 tablet, Rfl: 3 .  CALCIUM-MAGNESIUM-VITAMIN D ER PO, Take 1 tablet by mouth daily., Disp: , Rfl:  .  cetirizine (ZYRTEC) 10 MG tablet, Take 10 mg daily by mouth., Disp: , Rfl:  .  fluticasone (FLONASE) 50 MCG/ACT nasal spray, Place 2 sprays daily into both nostrils., Disp: 16 g, Rfl: 6 .  Multiple Vitamin (MULTIVITAMIN) capsule, Take 1 capsule by mouth daily., Disp: , Rfl:  .  Omega-3 Krill Oil 500 MG CAPS, Take 1,000 mg by mouth daily. , Disp: , Rfl:   Review of Systems  Constitutional: Negative for fever.  HENT: Positive for postnasal drip. Negative for sore throat.   Respiratory: Positive for cough (mostly dry) and shortness of breath.   Musculoskeletal: Positive for neck pain.  Hematological: Positive for adenopathy.    Social History   Tobacco Use  . Smoking status: Never Smoker  . Smokeless tobacco: Never Used  Substance Use Topics  . Alcohol use: No   Objective:   BP 116/64 (BP Location: Left Arm,  Patient Position: Sitting, Cuff Size: Large)   Pulse 94   Temp 98.3 F (36.8 C) (Oral)   Resp 16   Wt 184 lb (83.5 kg)   SpO2 99%   BMI 33.65 kg/m  Vitals:   12/04/17 0952  BP: 116/64  Pulse: 94  Resp: 16  Temp: 98.3 F (36.8 C)  TempSrc: Oral  SpO2: 99%  Weight: 184 lb (83.5 kg)     Physical Exam  Constitutional: She is oriented to person, place, and time. She appears well-developed and well-nourished. No distress.  HENT:  Head: Normocephalic and atraumatic.  Right Ear: External ear normal.  Left Ear: External ear normal.  Nose: Nose normal.  Mouth/Throat: Uvula is midline and mucous membranes are normal. No trismus in the jaw. Normal dentition. Tonsillar abscesses (no further exudate on R tonsil) present. No posterior oropharyngeal edema.  Eyes: Conjunctivae are normal. Pupils are equal, round, and reactive to light. No scleral icterus.  Neck: Neck supple. No thyromegaly present.  Cardiovascular: Normal rate, regular rhythm, normal heart sounds and intact distal pulses.  No murmur heard. Pulmonary/Chest: Effort normal and breath sounds normal. No respiratory distress. She has no wheezes. She has no rales.  Musculoskeletal: She exhibits no edema or deformity.  Lymphadenopathy:    She has cervical adenopathy (Large ~5cm fullness that is TTP under R mandible).  Neurological: She is  alert and oriented to person, place, and time.  Skin: Skin is warm and dry. No rash noted.  Psychiatric: She has a normal mood and affect. Her behavior is normal.  Vitals reviewed.      Assessment & Plan:     1. Abscess, peritonsillar, 2. Cervical lymphadenitis - concern for peritonsillar abscess given visual, but does not have muffled voice or trismus - airway stable without any signs of compromise - VVS and patient is fairly well appearing - has failed course of OP abx - discussed with on-cll ENT Dr. Pryor Ochoa - agrees that patient needs evaluation and will see patient in office at 1pm  today - discussed possibilty of airway compromise and patient knowsto seek emergent care if she develops any symptoms  Return if symptoms worsen or fail to improve.     The entirety of the information documented in the History of Present Illness, Review of Systems and Physical Exam were personally obtained by me. Portions of this information were initially documented by Victoria Benson, CMA and reviewed by me for thoroughness and accuracy.    Virginia Crews, MD, MPH [facility] 12/04/2017 12:19 PM

## 2017-12-11 ENCOUNTER — Ambulatory Visit: Payer: 59

## 2017-12-12 DIAGNOSIS — J351 Hypertrophy of tonsils: Secondary | ICD-10-CM | POA: Diagnosis not present

## 2017-12-12 DIAGNOSIS — R590 Localized enlarged lymph nodes: Secondary | ICD-10-CM | POA: Diagnosis not present

## 2017-12-15 ENCOUNTER — Ambulatory Visit
Admission: RE | Admit: 2017-12-15 | Discharge: 2017-12-15 | Disposition: A | Discharge status: Home / Self Care | Payer: Commercial Managed Care - HMO | Source: Ambulatory Visit | Attending: Otolaryngology | Admitting: Otolaryngology

## 2017-12-15 DIAGNOSIS — R221 Localized swelling, mass and lump, neck: Secondary | ICD-10-CM

## 2017-12-15 DIAGNOSIS — R590 Localized enlarged lymph nodes: Secondary | ICD-10-CM | POA: Insufficient documentation

## 2017-12-15 DIAGNOSIS — E042 Nontoxic multinodular goiter: Secondary | ICD-10-CM | POA: Insufficient documentation

## 2017-12-15 DIAGNOSIS — J039 Acute tonsillitis, unspecified: Secondary | ICD-10-CM | POA: Diagnosis not present

## 2017-12-15 LAB — POCT I-STAT CREATININE: Creatinine, Ser: 0.6 mg/dL (ref 0.44–1.00)

## 2017-12-15 MED ORDER — IOPAMIDOL (ISOVUE-300) INJECTION 61%
75.0000 mL | Freq: Once | INTRAVENOUS | Status: AC | PRN
  Administered 2017-12-15: 75 mL via INTRAVENOUS

## 2017-12-16 ENCOUNTER — Other Ambulatory Visit: Payer: Self-pay | Admitting: Otolaryngology

## 2017-12-16 DIAGNOSIS — R221 Localized swelling, mass and lump, neck: Secondary | ICD-10-CM

## 2017-12-26 ENCOUNTER — Other Ambulatory Visit: Payer: Self-pay | Admitting: Radiology

## 2017-12-29 ENCOUNTER — Ambulatory Visit
Admission: RE | Admit: 2017-12-29 | Discharge: 2017-12-29 | Disposition: A | Discharge status: Home / Self Care | Payer: 59 | Source: Ambulatory Visit | Attending: Otolaryngology | Admitting: Otolaryngology

## 2017-12-29 DIAGNOSIS — C770 Secondary and unspecified malignant neoplasm of lymph nodes of head, face and neck: Secondary | ICD-10-CM | POA: Diagnosis not present

## 2017-12-29 DIAGNOSIS — R221 Localized swelling, mass and lump, neck: Secondary | ICD-10-CM | POA: Diagnosis present

## 2017-12-29 DIAGNOSIS — C8331 Diffuse large B-cell lymphoma, lymph nodes of head, face, and neck: Secondary | ICD-10-CM | POA: Insufficient documentation

## 2017-12-29 DIAGNOSIS — R599 Enlarged lymph nodes, unspecified: Secondary | ICD-10-CM | POA: Diagnosis not present

## 2017-12-29 DIAGNOSIS — Z853 Personal history of malignant neoplasm of breast: Secondary | ICD-10-CM | POA: Diagnosis not present

## 2017-12-29 DIAGNOSIS — R898 Other abnormal findings in specimens from other organs, systems and tissues: Secondary | ICD-10-CM | POA: Diagnosis not present

## 2017-12-29 DIAGNOSIS — R590 Localized enlarged lymph nodes: Secondary | ICD-10-CM | POA: Diagnosis not present

## 2017-12-29 LAB — PROTIME-INR
INR: 1.01
Prothrombin Time: 13.2 seconds (ref 11.4–15.2)

## 2017-12-29 LAB — CBC
HEMATOCRIT: 45.8 % (ref 35.0–47.0)
Hemoglobin: 15.3 g/dL (ref 12.0–16.0)
MCH: 29.2 pg (ref 26.0–34.0)
MCHC: 33.3 g/dL (ref 32.0–36.0)
MCV: 87.6 fL (ref 80.0–100.0)
Platelets: 194 10*3/uL (ref 150–440)
RBC: 5.23 MIL/uL — ABNORMAL HIGH (ref 3.80–5.20)
RDW: 13.8 % (ref 11.5–14.5)
WBC: 7.4 10*3/uL (ref 3.6–11.0)

## 2017-12-29 LAB — APTT: aPTT: 30 seconds (ref 24–36)

## 2017-12-29 MED ORDER — MIDAZOLAM HCL 5 MG/5ML IJ SOLN
INTRAMUSCULAR | Status: AC
  Filled 2017-12-29: qty 5

## 2017-12-29 MED ORDER — HYDROCODONE-ACETAMINOPHEN 5-325 MG PO TABS
1.0000 | ORAL_TABLET | ORAL | Status: DC | PRN
  Filled 2017-12-29: qty 2

## 2017-12-29 MED ORDER — MIDAZOLAM HCL 5 MG/5ML IJ SOLN
INTRAMUSCULAR | Status: AC | PRN
  Administered 2017-12-29 (×2): 1 mg via INTRAVENOUS

## 2017-12-29 MED ORDER — SODIUM CHLORIDE 0.9 % IV SOLN: INTRAVENOUS | Status: DC

## 2017-12-29 MED ORDER — FENTANYL CITRATE (PF) 100 MCG/2ML IJ SOLN
INTRAMUSCULAR | Status: AC
  Filled 2017-12-29: qty 4

## 2017-12-29 MED ORDER — FENTANYL CITRATE (PF) 100 MCG/2ML IJ SOLN
INTRAMUSCULAR | Status: AC | PRN
  Administered 2017-12-29: 50 ug via INTRAVENOUS

## 2017-12-29 NOTE — Procedures (Signed)
  Procedure: [place] core R cerv LAN 18g x3 to surg path in saline EBL:   minimal Complications:  none immediate  See full dictation in [facility].  Dillard Cannon MD Main # [phone] Pager  [phone]

## 2017-12-30 DIAGNOSIS — Z9221 Personal history of antineoplastic chemotherapy: Secondary | ICD-10-CM

## 2017-12-30 HISTORY — DX: Personal history of antineoplastic chemotherapy: Z92.21

## 2018-01-06 ENCOUNTER — Other Ambulatory Visit: Payer: Self-pay | Admitting: Otolaryngology

## 2018-01-06 DIAGNOSIS — R590 Localized enlarged lymph nodes: Secondary | ICD-10-CM | POA: Diagnosis not present

## 2018-01-06 DIAGNOSIS — C770 Secondary and unspecified malignant neoplasm of lymph nodes of head, face and neck: Secondary | ICD-10-CM

## 2018-01-06 DIAGNOSIS — R1313 Dysphagia, pharyngeal phase: Secondary | ICD-10-CM | POA: Diagnosis not present

## 2018-01-07 ENCOUNTER — Other Ambulatory Visit: Payer: Self-pay

## 2018-01-08 ENCOUNTER — Inpatient Hospital Stay: Payer: Commercial Managed Care - HMO | Attending: Oncology | Admitting: Oncology

## 2018-01-08 ENCOUNTER — Other Ambulatory Visit: Payer: Self-pay

## 2018-01-08 ENCOUNTER — Encounter: Payer: Self-pay | Admitting: Oncology

## 2018-01-08 ENCOUNTER — Inpatient Hospital Stay: Payer: Commercial Managed Care - HMO

## 2018-01-08 VITALS — BP 126/75 | HR 74 | Temp 95.2°F | Resp 18 | Ht 63.39 in | Wt 179.9 lb

## 2018-01-08 DIAGNOSIS — Z170 Estrogen receptor positive status [ER+]: Secondary | ICD-10-CM | POA: Insufficient documentation

## 2018-01-08 DIAGNOSIS — Z5111 Encounter for antineoplastic chemotherapy: Secondary | ICD-10-CM | POA: Insufficient documentation

## 2018-01-08 DIAGNOSIS — C8330 Diffuse large B-cell lymphoma, unspecified site: Secondary | ICD-10-CM

## 2018-01-08 DIAGNOSIS — K589 Irritable bowel syndrome without diarrhea: Secondary | ICD-10-CM | POA: Diagnosis not present

## 2018-01-08 DIAGNOSIS — I872 Venous insufficiency (chronic) (peripheral): Secondary | ICD-10-CM | POA: Diagnosis not present

## 2018-01-08 DIAGNOSIS — E042 Nontoxic multinodular goiter: Secondary | ICD-10-CM | POA: Diagnosis not present

## 2018-01-08 DIAGNOSIS — E559 Vitamin D deficiency, unspecified: Secondary | ICD-10-CM | POA: Insufficient documentation

## 2018-01-08 DIAGNOSIS — E785 Hyperlipidemia, unspecified: Secondary | ICD-10-CM | POA: Diagnosis not present

## 2018-01-08 DIAGNOSIS — M79606 Pain in leg, unspecified: Secondary | ICD-10-CM

## 2018-01-08 DIAGNOSIS — Z7952 Long term (current) use of systemic steroids: Secondary | ICD-10-CM | POA: Insufficient documentation

## 2018-01-08 DIAGNOSIS — R011 Cardiac murmur, unspecified: Secondary | ICD-10-CM | POA: Insufficient documentation

## 2018-01-08 DIAGNOSIS — R1310 Dysphagia, unspecified: Secondary | ICD-10-CM | POA: Insufficient documentation

## 2018-01-08 DIAGNOSIS — I8393 Asymptomatic varicose veins of bilateral lower extremities: Secondary | ICD-10-CM | POA: Diagnosis not present

## 2018-01-08 DIAGNOSIS — E039 Hypothyroidism, unspecified: Secondary | ICD-10-CM | POA: Insufficient documentation

## 2018-01-08 DIAGNOSIS — Z79811 Long term (current) use of aromatase inhibitors: Secondary | ICD-10-CM | POA: Diagnosis not present

## 2018-01-08 DIAGNOSIS — Z7189 Other specified counseling: Secondary | ICD-10-CM

## 2018-01-08 DIAGNOSIS — Z923 Personal history of irradiation: Secondary | ICD-10-CM | POA: Insufficient documentation

## 2018-01-08 DIAGNOSIS — Z79899 Other long term (current) drug therapy: Secondary | ICD-10-CM

## 2018-01-08 DIAGNOSIS — E781 Pure hyperglyceridemia: Secondary | ICD-10-CM | POA: Diagnosis not present

## 2018-01-08 DIAGNOSIS — Z5112 Encounter for antineoplastic immunotherapy: Secondary | ICD-10-CM | POA: Insufficient documentation

## 2018-01-08 DIAGNOSIS — Z801 Family history of malignant neoplasm of trachea, bronchus and lung: Secondary | ICD-10-CM | POA: Insufficient documentation

## 2018-01-08 DIAGNOSIS — Z9049 Acquired absence of other specified parts of digestive tract: Secondary | ICD-10-CM | POA: Insufficient documentation

## 2018-01-08 DIAGNOSIS — Z85828 Personal history of other malignant neoplasm of skin: Secondary | ICD-10-CM | POA: Insufficient documentation

## 2018-01-08 DIAGNOSIS — M25562 Pain in left knee: Secondary | ICD-10-CM | POA: Diagnosis not present

## 2018-01-08 DIAGNOSIS — Z7689 Persons encountering health services in other specified circumstances: Secondary | ICD-10-CM | POA: Insufficient documentation

## 2018-01-08 DIAGNOSIS — Z8041 Family history of malignant neoplasm of ovary: Secondary | ICD-10-CM | POA: Insufficient documentation

## 2018-01-08 DIAGNOSIS — C50211 Malignant neoplasm of upper-inner quadrant of right female breast: Secondary | ICD-10-CM | POA: Diagnosis not present

## 2018-01-08 LAB — CBC WITH DIFFERENTIAL/PLATELET
BASOS ABS: 0 10*3/uL (ref 0–0.1)
Basophils Relative: 0 %
Eosinophils Absolute: 0 10*3/uL (ref 0–0.7)
Eosinophils Relative: 0 %
HEMATOCRIT: 41.5 % (ref 35.0–47.0)
Hemoglobin: 13.9 g/dL (ref 12.0–16.0)
Lymphocytes Relative: 14 %
Lymphs Abs: 1.7 10*3/uL (ref 1.0–3.6)
MCH: 29.2 pg (ref 26.0–34.0)
MCHC: 33.5 g/dL (ref 32.0–36.0)
MCV: 87.2 fL (ref 80.0–100.0)
MONO ABS: 0.7 10*3/uL (ref 0.2–0.9)
MONOS PCT: 6 %
NEUTROS ABS: 9.6 10*3/uL — AB (ref 1.4–6.5)
Neutrophils Relative %: 80 %
Platelets: 247 10*3/uL (ref 150–440)
RBC: 4.76 MIL/uL (ref 3.80–5.20)
RDW: 13 % (ref 11.5–14.5)
WBC: 12.1 10*3/uL — ABNORMAL HIGH (ref 3.6–11.0)

## 2018-01-08 LAB — COMPREHENSIVE METABOLIC PANEL
ALBUMIN: 4.1 g/dL (ref 3.5–5.0)
ALT: 18 U/L (ref 14–54)
AST: 16 U/L (ref 15–41)
Alkaline Phosphatase: 84 U/L (ref 38–126)
Anion gap: 10 (ref 5–15)
BUN: 14 mg/dL (ref 6–20)
CHLORIDE: 102 mmol/L (ref 101–111)
CO2: 29 mmol/L (ref 22–32)
Calcium: 9.9 mg/dL (ref 8.9–10.3)
Creatinine, Ser: 0.57 mg/dL (ref 0.44–1.00)
GFR calc Af Amer: >60 mL/min (ref >60)
GFR calc non Af Amer: >60 mL/min (ref >60)
GLUCOSE: 95 mg/dL (ref 65–99)
POTASSIUM: 3.5 mmol/L (ref 3.5–5.1)
Sodium: 141 mmol/L (ref 135–145)
Total Bilirubin: 0.6 mg/dL (ref 0.3–1.2)
Total Protein: 7.8 g/dL (ref 6.5–8.1)

## 2018-01-08 LAB — URIC ACID: URIC ACID, SERUM: 4.1 mg/dL (ref 2.3–6.6)

## 2018-01-08 LAB — PHOSPHORUS: PHOSPHORUS: 2.7 mg/dL (ref 2.5–4.6)

## 2018-01-08 LAB — LACTATE DEHYDROGENASE: LDH: 117 U/L (ref 98–192)

## 2018-01-08 NOTE — Progress Notes (Signed)
Hematology/Oncology Consult note [facility] Telephone:([phone] [phone]  Patient Care Team: Rubye Beach as PCP - General (Family Medicine) Margarita Rana, MD as Referring Physician (Family Medicine) Bary Castilla Forest Gleason, MD (General Surgery)   Name of the patient: Victoria Benson  [id number]  Aug 08, 1956    Reason for referral- right neck mass prelim path shows DLBCL   Referring physician- Dr. Pryor Ochoa  Date of visit: 01/08/18   History of presenting illness-patient is a 62 year old female who developed symptoms of upper respiratory infection and nasal congestion sometime in November 2018.  Around the same time she also developed a right neck mass which was initially attributed to possible tonsillitis and she was given antibiotics for the same.  However her right neck mass did not decrease in size and she eventually saw Dr. Pryor Ochoa from ENT.  Patient was found to have significant right cervical adenopathy and underwent CT of the soft tissue neck which showed right level 2 and 3 adenopathy favoring metastatic carcinoma.  Jugulodigastric node measuring up to 3.7 cm.  Asymmetric fullness of the right tonsillar fossa possibly primary site.   Patient underwent core biopsy of the right cervical lymph node.  I got a call from Dr. Luana Shu from pathology and pathology has been sent out to [facility] for definitive diagnosis but the prelim diagnosis is positive for CD20 positive diffuse large B-cell lymphoma  Patient reports feeling well prior to November 2018.  Currently she denies any fevers, chills, unintentional weight loss or drenching night sweats.  She reports some difficulty swallowing because of the right neck mass but denies any shortness of breath.  She did receive 1 dose of 100 mg prednisone yesterday and reports that her mass feels smaller and swallowing is easier.  ECOG PS- 0  Pain scale- 0   Review of systems- Review of Systems    Constitutional: Negative for chills, fever, malaise/fatigue and weight loss.  HENT: Negative for congestion, ear discharge and nosebleeds.        Right neck pain and swelling  Eyes: Negative for blurred vision.  Respiratory: Negative for cough, hemoptysis, sputum production, shortness of breath and wheezing.   Cardiovascular: Negative for chest pain, palpitations, orthopnea and claudication.  Gastrointestinal: Negative for abdominal pain, blood in stool, constipation, diarrhea, heartburn, melena, nausea and vomiting.  Genitourinary: Negative for dysuria, flank pain, frequency, hematuria and urgency.  Musculoskeletal: Negative for back pain, joint pain and myalgias.  Skin: Negative for rash.  Neurological: Negative for dizziness, tingling, focal weakness, seizures, weakness and headaches.  Endo/Heme/Allergies: Does not bruise/bleed easily.  Psychiatric/Behavioral: Negative for depression and suicidal ideas. The patient does not have insomnia.     No Known Allergies  Patient Active Problem List   Diagnosis Date Noted  . Chronic venous insufficiency 04/21/2017  . Leg pain 04/21/2017  . Malignant neoplasm of upper-inner quadrant of right breast in female, estrogen receptor positive ([place]) 07/11/2016  . Localized swelling, mass and lump, neck 05/31/2016  . Knee pain, left 05/14/2016  . Subclinical hypothyroidism 03/20/2016  . Allergic rhinitis 01/04/2016  . Blood in the urine 01/04/2016  . Varicose veins of both lower extremities with pain 01/04/2016  . Hyperglyceridemia, pure 06/22/2009  . Avitaminosis D 05/18/2009  . Fam hx-ischem heart disease 04/05/2009  . HLD (hyperlipidemia) 04/04/2009  . Adaptive colitis 04/04/2009     Past Medical History:  Diagnosis Date  . BRCA gene mutation negative 06/2016  . Breast cancer ([place]) 2009   T1c (1.2 cm)  N0 ER 90%, PR 90%, HER-2/neu not amplified. Oncotype DX score, low risk (14) 9% chance of recurrence with antiestrogen therapy alone.  .  Breast cancer ([place]) 06-17-16   INVASIVE MAMMARY CARCINOMA . T1, ER positive, PR positive, HER-2/neu not overexpressed  . Family history of adverse reaction to anesthesia    PTS MOM-HARD TO WAKE UP  . Heart murmur   . Radiation 2009   BREAST CA  . Skin cancer      Past Surgical History:  Procedure Laterality Date  . BREAST BIOPSY Right 06/17/2016   invasive. T1, ER positive, PR positive, HER-2/neu not overexpressed  . BREAST CYST ASPIRATION Left 2002  . BREAST EXCISIONAL BIOPSY Right 06/2016   lumpectomy with rad   . BREAST EXCISIONAL BIOPSY Right 2009   breast ca +  . BREAST LUMPECTOMY WITH NEEDLE LOCALIZATION Right 07/18/2016   Procedure: BREAST LUMPECTOMY WITH NEEDLE LOCALIZATION AND RIGHT AXILLARY EXPLORATION;  Surgeon: Robert Bellow, MD;  Location: [facility] ORS;  Service: General;  Laterality: Right;  . BREAST SURGERY Right July 2009   Wide excision, sentinel left node biopsy MammoSite. ( 06/21/2008  . CHOLECYSTECTOMY  1984  . DILATION AND CURETTAGE OF UTERUS  2004  . ENDOMETRIAL ABLATION  2005  . TUMOR REMOVAL  12/2013   from uterus done by [facility]    Social History   Socioeconomic History  . Marital status: Married    Spouse name: [name]  . Number of children: 2  . Years of education: 62  . Highest education level: Not on file  Social Needs  . Financial resource strain: Not on file  . Food insecurity - worry: Not on file  . Food insecurity - inability: Not on file  . Transportation needs - medical: Not on file  . Transportation needs - non-medical: Not on file  Occupational History  . Occupation: unemployed  Tobacco Use  . Smoking status: Never Smoker  . Smokeless tobacco: Never Used  Substance and Sexual Activity  . Alcohol use: No  . Drug use: No  . Sexual activity: Yes  Other Topics Concern  . Not on file  Social History Narrative  . Not on file     Family History  Problem Relation Age of Onset  . CAD Mother   . Osteoporosis Mother   . Lung  cancer Father   . Diabetes Father   . Hypertension Brother   . Ovarian cancer Maternal Grandmother   . Heart attack Paternal Grandmother   . Diabetes Paternal Grandfather   . Fibromyalgia Brother   . Hypertension Brother   . Hypertension Brother   . Sleep apnea Brother   . Healthy Brother   . Sleep apnea Brother   . Ovarian cancer Other   . Breast cancer Neg Hx      Current Outpatient Medications:  .  anastrozole (ARIMIDEX) 1 MG tablet, Take 1 tablet (1 mg total) by mouth daily., Disp: 90 tablet, Rfl: 3 .  CALCIUM-MAGNESIUM-VITAMIN D ER PO, Take 1 tablet by mouth daily., Disp: , Rfl:  .  cetirizine (ZYRTEC) 10 MG tablet, Take 10 mg daily by mouth., Disp: , Rfl:  .  fluticasone (FLONASE) 50 MCG/ACT nasal spray, Place 2 sprays daily into both nostrils., Disp: 16 g, Rfl: 6 .  Multiple Vitamin (MULTIVITAMIN) capsule, Take 1 capsule by mouth daily., Disp: , Rfl:  .  Omega-3 Krill Oil 500 MG CAPS, Take 1,000 mg by mouth daily. , Disp: , Rfl:  .  predniSONE (DELTASONE) 20  MG tablet, Take 20 mg by mouth daily with breakfast., Disp: , Rfl:  .  allopurinol (ZYLOPRIM) 300 MG tablet, Take 300 mg by mouth daily., Disp: , Rfl:    Physical exam:  Vitals:   01/08/18 0853  BP: 126/75  Pulse: 74  Resp: 18  Temp: (!) 95.2 F (35.1 C)  TempSrc: Tympanic  Weight: 179 lb 14.4 oz (81.6 kg)  Height: 5' 3.39" (1.61 m)   Physical Exam  Constitutional: She is oriented to person, place, and time and well-developed, well-nourished, and in no distress.  HENT:  Head: Normocephalic and atraumatic.  Eyes: EOM are normal. Pupils are equal, round, and reactive to light.  Neck: Normal range of motion.  Cardiovascular: Normal rate and regular rhythm.  Systolic murmur positive  Pulmonary/Chest: Effort normal and breath sounds normal.  Abdominal: Soft. Bowel sounds are normal.  No palpable splenomegaly  Lymphadenopathy:  Palpable right cervical adenopathy about 4 cm in size.  No palpable submandibular,  left cervical, supraclavicular, axillary or inguinal adenopathy  Neurological: She is alert and oriented to person, place, and time.  Skin: Skin is warm and dry.       CMP Latest Ref Rng & Units 01/08/2018  Glucose 65 - 99 mg/dL 95  BUN 6 - 20 mg/dL 14  Creatinine 0.44 - 1.00 mg/dL 0.57  Sodium 135 - 145 mmol/L 141  Potassium 3.5 - 5.1 mmol/L 3.5  Chloride 101 - 111 mmol/L 102  CO2 22 - 32 mmol/L 29  Calcium 8.9 - 10.3 mg/dL 9.9  Total Protein 6.5 - 8.1 g/dL 7.8  Total Bilirubin 0.3 - 1.2 mg/dL 0.6  Alkaline Phos 38 - 126 U/L 84  AST 15 - 41 U/L 16  ALT 14 - 54 U/L 18   CBC Latest Ref Rng & Units 01/08/2018  WBC 3.6 - 11.0 K/uL 12.1(H)  Hemoglobin 12.0 - 16.0 g/dL 13.9  Hematocrit 35.0 - 47.0 % 41.5  Platelets 150 - 440 K/uL 247    No images are attached to the encounter.  Ct Soft Tissue Neck W Contrast  Result Date: 12/15/2017 CLINICAL DATA:  Neck mass. Patient diagnosed with right-sided tonsillitis 5 weeks ago. History of right-sided breast cancer. EXAM: CT NECK WITH CONTRAST TECHNIQUE: Multidetector CT imaging of the neck was performed using the standard protocol following the bolus administration of intravenous contrast. CONTRAST:  54m ISOVUE-300 IOPAMIDOL (ISOVUE-300) INJECTION 61% COMPARISON:  None. FINDINGS: Pharynx and larynx: Asymmetric fullness of the right tonsil that appears centrally low-dense, although degraded by artifact from dental amalgam. Salivary glands: No inflammation, mass, or stone. Thyroid: Right thyroid nodules measuring up to at least 15 mm in the lower pole. Lymph nodes: 5 suspicious right level 2/3 lymph nodes. Right right jugulodigastric node that has low-density appearance inferiorly measures up to 3.7 cm on reformats. 3 right level IIb nodes that are rounded and abnormal density, 1 centrally necrotic and 12 mm in size. There is a single small but suspicious right level 3 node. Vascular: Negative Limited intracranial: Negative Visualized orbits:  Negative Mastoids and visualized paranasal sinuses: Clear Skeleton: No acute or aggressive finding. C5-6 and C6-7 disc degeneration Upper chest: Negative Other: These results will be called to the ordering clinician or representative by the Radiologist Assistant, and communication documented in the PACS or zVision Dashboard. IMPRESSION: 1. Right level 2/3 adenopathy favoring metastatic carcinoma, jugulodigastric node measuring up to 3.7 cm. Asymmetric fullness of the right tonsillar fossa, possible primary site. 2. Right thyroid nodules, attention on follow-up. Electronically Signed  By: Monte Fantasia M.D.   On: 12/15/2017 09:24   [place] Core Biopsy (lymph Nodes)  Result Date: 12/29/2017 CLINICAL DATA:  Right cervical adenopathy. History of breast carcinoma. EXAM: ULTRASOUND GUIDED CORE BIOPSY OF RIGHT CERVICAL ADENOPATHY MEDICATIONS: Intravenous Fentanyl and Versed were administered as conscious sedation during continuous monitoring of the patient's level of consciousness and physiological / cardiorespiratory status by the radiology RN, with a total moderate sedation time of 20 minutes. PROCEDURE: The procedure, risks, benefits, and alternatives were explained to the patient. Questions regarding the procedure were encouraged and answered. The patient understands and consents to the procedure. Survey ultrasound of the right cervical region was performed and the level 2/3 adenopathy was localized. An appropriate skin entry site was determined and marked. The operative field was prepped with chlorhexidine in a sterile fashion, and a sterile drape was applied covering the operative field. A sterile gown and sterile gloves were used for the procedure. Local anesthesia was provided with 1% Lidocaine. Under real-time ultrasound guidance, a 17 gauge trocar needle was advanced to the margin of the lesion. Once needle tip position was confirmed, coaxial 18-gauge core biopsy samples were obtained, submitted in saline to  surgical pathology. The guide needle was removed. Postprocedure scans show no hemorrhage or other apparent complication. The patient tolerated the procedure well. COMPLICATIONS: None. FINDINGS: Hypoechoic right cervical adenopathy was again localized corresponding to CT findings. Representative core biopsies obtained as above. IMPRESSION: 1. Technically successful ultrasound-guided core biopsy, right cervical adenopathy. Electronically Signed   By: Lucrezia Europe M.D.   On: 12/29/2017 14:21    Assessment and plan- Patient is a 62 y.o. female referred for right cervical adenopathy and prelim path suggestive of CD20 positive diffuse large B-cell lymphoma (IPI risk stratification pending)  I discussed the results of the CT scan and the prelim pathology with the patient in detail.  Given that the path is consistent with CD20 positive diffuse large B-cell lymphoma and not a carcinoma, I will proceed with workup for lymphoma as such at this time.  I did recommend that Dr. Pryor Ochoa should start her on 100 mg of prednisone for 5 days when she has taken 1 dose so far.  I have asked her to hold off on the remaining 4 doses until her PET scan as I do not want her to have any hyperglycemia which would preclude her PET CT scan that is scheduled on 01/12/2018.  Patient will start the remaining 4 doses of 100 mg prednisone along with allopurinol 300 mg daily on 01/12/2018 for tumor lysis syndrome prophylaxis.  I discussed the natural history of diffuse large B-cell lymphoma, staging prognosis and treatment options for the same at this time she would need a PET/CT scan to complete her staging workup.  If she is found to have stage I or stage II DLBCL I would be inclined to do 3 cycles of R CHOP chemotherapy followed by involved field radiation.  If she has stage III or stage IV diffuse large B-cell lymphoma then the standard of care would be to offer 6 cycles of R CHOP chemotherapy.  Discussed risks and benefits of chemotherapy  including all but not limited to fatigue, nausea, vomiting, low blood counts, hair loss, risk of infections and hospitalizations.  Risk of cardiotoxicity associated with anthracycline, peripheral neuropathy associated with vincristine.  Patient understands and agrees to proceed   We will obtain a baseline MUGA scan prior to starting anthracycline.  I will also get port placed in anticipation of  chemotherapy and proceed with bone marrow biopsy at the same time by IR to complete her staging workup.  FISH testing for cmyc and BCL-2 BCL 6 is currently pending to a certain if she has double hit diffuse large B-cell lymphoma in which case chemotherapy regimen would have to be modified to dose adjusted Happy  I will hold off on radiation oncology consultation at this time given that she would need systemic chemotherapy first for diffuse large B-cell lymphoma and there may be a role for radiation down the line but not yet  I will see her back in 1 week's time to tentatively start R CHOP chemotherapy on that day and discuss her PET/CT scan results at that time.  Treatment will be given with curative intent.  I will check CBC, CMP, LDH, uric acid, phosphorus, HIV hepatitis B and hepatitis C serologies today.   Total face to face encounter time for this patient visit was 45 min. >50% of the time was  spent in counseling and coordination of care.      Thank you for this kind referral and the opportunity to participate in the care of this patient   Visit Diagnosis 1. Diffuse large B-cell lymphoma, unspecified body region ([place])   2. Goals of care, counseling/discussion     Dr. Randa Evens, MD, MPH [facility] Pager- [phone] 01/08/2018  11:20 AM

## 2018-01-08 NOTE — Progress Notes (Signed)
Here for new pt evaluation NOTE is on 20 mg of Prednisone x 5 per day . On day 2 w no taper

## 2018-01-09 ENCOUNTER — Telehealth: Payer: Self-pay | Admitting: *Deleted

## 2018-01-09 ENCOUNTER — Other Ambulatory Visit: Payer: Self-pay | Admitting: Oncology

## 2018-01-09 DIAGNOSIS — C8331 Diffuse large B-cell lymphoma, lymph nodes of head, face, and neck: Secondary | ICD-10-CM

## 2018-01-09 NOTE — Telephone Encounter (Signed)
Patient called to report that her insurance has not approved her PET scan yet and it has been rescheduled for 1/22. Her question is if she needs to proceed with her chemotherapy on 1/18, or does that also need to be rescheduled until after her PET is done. Please advise

## 2018-01-09 NOTE — Telephone Encounter (Signed)
Returned call to patient and she was advised of doctor response

## 2018-01-09 NOTE — Telephone Encounter (Signed)
We are looking into getting the pet done sooner than 1/22. Path is getting finalized today. We will keep her posted.

## 2018-01-09 NOTE — Telephone Encounter (Signed)
Patient informed per VO Dr Janese Banks that her PET scan has been appvd and rescheduled back to Monday 1/14, NPO after MN for exam. Went over all appts for every day next week and she stated "at least things are getting done"

## 2018-01-10 LAB — HEPATITIS B CORE ANTIBODY, TOTAL: Hep B Core Total Ab: NEGATIVE

## 2018-01-10 LAB — HIV ANTIBODY (ROUTINE TESTING W REFLEX): HIV Screen 4th Generation wRfx: NONREACTIVE

## 2018-01-10 LAB — HEPATITIS B SURFACE ANTIGEN: Hepatitis B Surface Ag: NEGATIVE

## 2018-01-10 LAB — HEPATITIS B SURFACE ANTIBODY, QUANTITATIVE: HEPATITIS B-POST: 3.7 m[IU]/mL — AB

## 2018-01-10 LAB — HEPATITIS C ANTIBODY

## 2018-01-11 ENCOUNTER — Telehealth: Payer: Self-pay | Admitting: *Deleted

## 2018-01-11 NOTE — Telephone Encounter (Signed)
Called patient to let her know about her bone marrow biopsy as well as the port placement date.  Patient already seen the information on my chart about her port placement which will be Thursday of this week and then the following week she will have the bone marrow biopsy.  She understands the n.p.o. 6-8 hours prior to the procedure, has to come with a driver, any medicines she needs to take with a sip of water.  She knows to arrive for the procedures both times 1 hour earlier than the appointment.  And if she has additional questions she is welcome to call us here at the cancer center.  Patient agreeable to above.

## 2018-01-12 ENCOUNTER — Other Ambulatory Visit: Payer: Self-pay | Admitting: Oncology

## 2018-01-12 ENCOUNTER — Encounter
Admission: RE | Admit: 2018-01-12 | Discharge: 2018-01-12 | Disposition: A | Discharge status: Home / Self Care | Payer: 59 | Source: Ambulatory Visit | Attending: Oncology | Admitting: Oncology

## 2018-01-12 ENCOUNTER — Ambulatory Visit: Payer: Commercial Managed Care - HMO

## 2018-01-12 DIAGNOSIS — C8331 Diffuse large B-cell lymphoma, lymph nodes of head, face, and neck: Secondary | ICD-10-CM | POA: Diagnosis not present

## 2018-01-12 DIAGNOSIS — C8512 Unspecified B-cell lymphoma, intrathoracic lymph nodes: Secondary | ICD-10-CM | POA: Diagnosis not present

## 2018-01-12 LAB — GLUCOSE, CAPILLARY: GLUCOSE-CAPILLARY: 96 mg/dL (ref 65–99)

## 2018-01-12 MED ORDER — FLUDEOXYGLUCOSE F - 18 (FDG) INJECTION
12.2100 | Freq: Once | INTRAVENOUS | Status: AC | PRN
  Administered 2018-01-12: 12.21 via INTRAVENOUS

## 2018-01-12 NOTE — Patient Instructions (Signed)
Rituximab injection What is this medicine? RITUXIMAB (ri TUX i mab) is a monoclonal antibody. It is used to treat certain types of cancer like non-Hodgkin lymphoma and chronic lymphocytic leukemia. It is also used to treat rheumatoid arthritis, granulomatosis with polyangiitis (or Wegener's granulomatosis), and microscopic polyangiitis. This medicine may be used for other purposes; ask your health care provider or pharmacist if you have questions. COMMON BRAND NAME(S): Rituxan What should I tell my health care provider before I take this medicine? They need to know if you have any of these conditions: -heart disease -infection (especially a virus infection such as hepatitis B, chickenpox, cold sores, or herpes) -immune system problems -irregular heartbeat -kidney disease -lung or breathing disease, like asthma -recently received or scheduled to receive a vaccine -an unusual or allergic reaction to rituximab, mouse proteins, other medicines, foods, dyes, or preservatives -pregnant or trying to get pregnant -breast-feeding How should I use this medicine? This medicine is for infusion into a vein. It is administered in a hospital or clinic by a specially trained health care professional. A special MedGuide will be given to you by the pharmacist with each prescription and refill. Be sure to read this information carefully each time. Talk to your pediatrician regarding the use of this medicine in children. This medicine is not approved for use in children. Overdosage: If you think you have taken too much of this medicine contact a poison control center or emergency room at once. NOTE: This medicine is only for you. Do not share this medicine with others. What if I miss a dose? It is important not to miss a dose. Call your doctor or health care professional if you are unable to keep an appointment. What may interact with this medicine? -cisplatin -other medicines for arthritis like disease  modifying antirheumatic drugs or tumor necrosis factor inhibitors -live virus vaccines This list may not describe all possible interactions. Give your health care provider a list of all the medicines, herbs, non-prescription drugs, or dietary supplements you use. Also tell them if you smoke, drink alcohol, or use illegal drugs. Some items may interact with your medicine. What should I watch for while using this medicine? Your condition will be monitored carefully while you are receiving this medicine. You may need blood work done while you are taking this medicine. This medicine can cause serious allergic reactions. To reduce your risk you may need to take medicine before treatment with this medicine. Take your medicine as directed. In some patients, this medicine may cause a serious brain infection that may cause death. If you have any problems seeing, thinking, speaking, walking, or standing, tell your doctor right away. If you cannot reach your doctor, urgently seek other source of medical care. Call your doctor or health care professional for advice if you get a fever, chills or sore throat, or other symptoms of a cold or flu. Do not treat yourself. This drug decreases your body's ability to fight infections. Try to avoid being around people who are sick. Do not become pregnant while taking this medicine or for 12 months after stopping it. Women should inform their doctor if they wish to become pregnant or think they might be pregnant. There is a potential for serious side effects to an unborn child. Talk to your health care professional or pharmacist for more information. What side effects may I notice from receiving this medicine? Side effects that you should report to your doctor or health care professional as soon as possible: -breathing   problems -chest pain -dizziness or feeling faint -fast, irregular heartbeat -low blood counts - this medicine may decrease the number of white blood cells,  red blood cells and platelets. You may be at increased risk for infections and bleeding. -mouth sores -redness, blistering, peeling or loosening of the skin, including inside the mouth (this can be added for any serious or exfoliative rash that could lead to hospitalization) -signs of infection - fever or chills, cough, sore throat, pain or difficulty passing urine -signs and symptoms of kidney injury like trouble passing urine or change in the amount of urine -signs and symptoms of liver injury like dark yellow or brown urine; general ill feeling or flu-like symptoms; light-colored stools; loss of appetite; nausea; right upper belly pain; unusually weak or tired; yellowing of the eyes or skin -stomach pain -vomiting Side effects that usually do not require medical attention (report to your doctor or health care professional if they continue or are bothersome): -headache -joint pain -muscle cramps or muscle pain This list may not describe all possible side effects. Call your doctor for medical advice about side effects. You may report side effects to FDA at 1-800-FDA-1088. Where should I keep my medicine? This drug is given in a hospital or clinic and will not be stored at home. NOTE: This sheet is a summary. It may not cover all possible information. If you have questions about this medicine, talk to your doctor, pharmacist, or health care provider.  2018 [employer]/Gold Standard (2016-07-24 15:28:09) Doxorubicin injection What is this medicine? DOXORUBICIN (dox oh ROO bi sin) is a chemotherapy drug. It is used to treat many kinds of cancer like leukemia, lymphoma, neuroblastoma, sarcoma, and Wilms' tumor. It is also used to treat bladder cancer, breast cancer, lung cancer, ovarian cancer, stomach cancer, and thyroid cancer. This medicine may be used for other purposes; ask your health care provider or pharmacist if you have questions. COMMON BRAND NAME(S): Adriamycin, Adriamycin PFS, Adriamycin  RDF, Rubex What should I tell my health care provider before I take this medicine? They need to know if you have any of these conditions: -heart disease -history of low blood counts caused by a medicine -liver disease -recent or ongoing radiation therapy -an unusual or allergic reaction to doxorubicin, other chemotherapy agents, other medicines, foods, dyes, or preservatives -pregnant or trying to get pregnant -breast-feeding How should I use this medicine? This drug is given as an infusion into a vein. It is administered in a hospital or clinic by a specially trained health care professional. If you have pain, swelling, burning or any unusual feeling around the site of your injection, tell your health care professional right away. Talk to your pediatrician regarding the use of this medicine in children. Special care may be needed. Overdosage: If you think you have taken too much of this medicine contact a poison control center or emergency room at once. NOTE: This medicine is only for you. Do not share this medicine with others. What if I miss a dose? It is important not to miss your dose. Call your doctor or health care professional if you are unable to keep an appointment. What may interact with this medicine? This medicine may interact with the following medications: -6-mercaptopurine -paclitaxel -phenytoin -St. John's Wort -trastuzumab -verapamil This list may not describe all possible interactions. Give your health care provider a list of all the medicines, herbs, non-prescription drugs, or dietary supplements you use. Also tell them if you smoke, drink alcohol, or use illegal drugs.   Some items may interact with your medicine. What should I watch for while using this medicine? This drug may make you feel generally unwell. This is not uncommon, as chemotherapy can affect healthy cells as well as cancer cells. Report any side effects. Continue your course of treatment even though you  feel ill unless your doctor tells you to stop. There is a maximum amount of this medicine you should receive throughout your life. The amount depends on the medical condition being treated and your overall health. Your doctor will watch how much of this medicine you receive in your lifetime. Tell your doctor if you have taken this medicine before. You may need blood work done while you are taking this medicine. Your urine may turn red for a few days after your dose. This is not blood. If your urine is dark or brown, call your doctor. In some cases, you may be given additional medicines to help with side effects. Follow all directions for their use. Call your doctor or health care professional for advice if you get a fever, chills or sore throat, or other symptoms of a cold or flu. Do not treat yourself. This drug decreases your body's ability to fight infections. Try to avoid being around people who are sick. This medicine may increase your risk to bruise or bleed. Call your doctor or health care professional if you notice any unusual bleeding. Talk to your doctor about your risk of cancer. You may be more at risk for certain types of cancers if you take this medicine. Do not become pregnant while taking this medicine or for 6 months after stopping it. Women should inform their doctor if they wish to become pregnant or think they might be pregnant. Men should not father a child while taking this medicine and for 6 months after stopping it. There is a potential for serious side effects to an unborn child. Talk to your health care professional or pharmacist for more information. Do not breast-feed an infant while taking this medicine. This medicine has caused ovarian failure in some women and reduced sperm counts in some men This medicine may interfere with the ability to have a child. Talk with your doctor or health care professional if you are concerned about your fertility. What side effects may I notice  from receiving this medicine? Side effects that you should report to your doctor or health care professional as soon as possible: -allergic reactions like skin rash, itching or hives, swelling of the face, lips, or tongue -breathing problems -chest pain -fast or irregular heartbeat -low blood counts - this medicine may decrease the number of white blood cells, red blood cells and platelets. You may be at increased risk for infections and bleeding. -pain, redness, or irritation at site where injected -signs of infection - fever or chills, cough, sore throat, pain or difficulty passing urine -signs of decreased platelets or bleeding - bruising, pinpoint red spots on the skin, black, tarry stools, blood in the urine -swelling of the ankles, feet, hands -tiredness -weakness Side effects that usually do not require medical attention (report to your doctor or health care professional if they continue or are bothersome): -diarrhea -hair loss -mouth sores -nail discoloration or damage -nausea -red colored urine -vomiting This list may not describe all possible side effects. Call your doctor for medical advice about side effects. You may report side effects to FDA at 1-800-FDA-1088. Where should I keep my medicine? This drug is given in a hospital or clinic   and will not be stored at home. NOTE: This sheet is a summary. It may not cover all possible information. If you have questions about this medicine, talk to your doctor, pharmacist, or health care provider.  2018 Else[employer]/Gold Standard (2016-02-12 11:28:51) Vincristine injection What is this medicine? VINCRISTINE (vin KRIS teen) is a chemotherapy drug. It slows the growth of cancer cells. This medicine is used to treat many types of cancer like Hodgkin's disease, leukemia, non-Hodgkin's lymphoma, neuroblastoma (brain cancer), rhabdomyosarcoma, and Wilms' tumor. This medicine may be used for other purposes; ask your health care provider or  pharmacist if you have questions. COMMON BRAND NAME(S): Oncovin, Vincasar PFS What should I tell my health care provider before I take this medicine? They need to know if you have any of these conditions: -blood disorders -gout -infection (especially chickenpox, cold sores, or herpes) -kidney disease -liver disease -lung disease -nervous system disease like Charcot-Marie-Tooth (CMT) -recent or ongoing radiation therapy -an unusual or allergic reaction to vincristine, other chemotherapy agents, other medicines, foods, dyes, or preservatives -pregnant or trying to get pregnant -breast-feeding How should I use this medicine? This drug is given as an infusion into a vein. It is administered in a hospital or clinic by a specially trained health care professional. If you have pain, swelling, burning, or any unusual feeling around the site of your injection, tell your health care professional right away. Talk to your pediatrician regarding the use of this medicine in children. While this drug may be prescribed for selected conditions, precautions do apply. Overdosage: If you think you have taken too much of this medicine contact a poison control center or emergency room at once. NOTE: This medicine is only for you. Do not share this medicine with others. What if I miss a dose? It is important not to miss your dose. Call your doctor or health care professional if you are unable to keep an appointment. What may interact with this medicine? Do not take this medicine with any of the following medications: -itraconazole -mibefradil -voriconazole This medicine may also interact with the following medications: -cyclosporine -erythromycin -fluconazole -ketoconazole -medicines for HIV like delavirdine, efavirenz, nevirapine -medicines for seizures like ethotoin, fosphenotoin, phenytoin -medicines to increase blood counts like filgrastim, pegfilgrastim, sargramostim -other chemotherapy drugs like  cisplatin, L-asparaginase, methotrexate, mitomycin, paclitaxel -pegaspargase -vaccines -zalcitabine, ddC Talk to your doctor or health care professional before taking any of these medicines: -acetaminophen -aspirin -ibuprofen -ketoprofen -naproxen This list may not describe all possible interactions. Give your health care provider a list of all the medicines, herbs, non-prescription drugs, or dietary supplements you use. Also tell them if you smoke, drink alcohol, or use illegal drugs. Some items may interact with your medicine. What should I watch for while using this medicine? Your condition will be monitored carefully while you are receiving this medicine. You will need important blood work done while you are taking this medicine. This drug may make you feel generally unwell. This is not uncommon, as chemotherapy can affect healthy cells as well as cancer cells. Report any side effects. Continue your course of treatment even though you feel ill unless your doctor tells you to stop. In some cases, you may be given additional medicines to help with side effects. Follow all directions for their use. Call your doctor or health care professional for advice if you get a fever, chills or sore throat, or other symptoms of a cold or flu. Do not treat yourself. Avoid taking products that contain aspirin, acetaminophen, ibuprofen,   naproxen, or ketoprofen unless instructed by your doctor. These medicines may hide a fever. Do not become pregnant while taking this medicine. Women should inform their doctor if they wish to become pregnant or think they might be pregnant. There is a potential for serious side effects to an unborn child. Talk to your health care professional or pharmacist for more information. Do not breast-feed an infant while taking this medicine. Men may have a lower sperm count while taking this medicine. Talk to your doctor if you plan to father a child. What side effects may I notice from  receiving this medicine? Side effects that you should report to your doctor or health care professional as soon as possible: -allergic reactions like skin rash, itching or hives, swelling of the face, lips, or tongue -breathing problems -confusion or changes in emotions or moods -constipation -cough -mouth sores -muscle weakness -nausea and vomiting -pain, swelling, redness or irritation at the injection site -pain, tingling, numbness in the hands or feet -problems with balance, talking, walking -seizures -stomach pain -trouble passing urine or change in the amount of urine Side effects that usually do not require medical attention (report to your doctor or health care professional if they continue or are bothersome): -diarrhea -hair loss -jaw pain -loss of appetite This list may not describe all possible side effects. Call your doctor for medical advice about side effects. You may report side effects to FDA at 1-800-FDA-1088. Where should I keep my medicine? This drug is given in a hospital or clinic and will not be stored at home. NOTE: This sheet is a summary. It may not cover all possible information. If you have questions about this medicine, talk to your doctor, pharmacist, or health care provider.  2018 Else[employer]/Gold Standard (2008-09-12 17:17:13) Cyclophosphamide injection What is this medicine? CYCLOPHOSPHAMIDE (sye kloe FOSS fa mide) is a chemotherapy drug. It slows the growth of cancer cells. This medicine is used to treat many types of cancer like lymphoma, myeloma, leukemia, breast cancer, and ovarian cancer, to name a few. This medicine may be used for other purposes; ask your health care provider or pharmacist if you have questions. COMMON BRAND NAME(S): Cytoxan, Neosar What should I tell my health care provider before I take this medicine? They need to know if you have any of these conditions: -blood disorders -history of other chemotherapy -infection -kidney  disease -liver disease -recent or ongoing radiation therapy -tumors in the bone marrow -an unusual or allergic reaction to cyclophosphamide, other chemotherapy, other medicines, foods, dyes, or preservatives -pregnant or trying to get pregnant -breast-feeding How should I use this medicine? This drug is usually given as an injection into a vein or muscle or by infusion into a vein. It is administered in a hospital or clinic by a specially trained health care professional. Talk to your pediatrician regarding the use of this medicine in children. Special care may be needed. Overdosage: If you think you have taken too much of this medicine contact a poison control center or emergency room at once. NOTE: This medicine is only for you. Do not share this medicine with others. What if I miss a dose? It is important not to miss your dose. Call your doctor or health care professional if you are unable to keep an appointment. What may interact with this medicine? This medicine may interact with the following medications: -amiodarone -amphotericin B -azathioprine -certain antiviral medicines for HIV or AIDS such as protease inhibitors (e.g., indinavir, ritonavir) and zidovudine -certain blood   pressure medications such as benazepril, captopril, enalapril, fosinopril, lisinopril, moexipril, monopril, perindopril, quinapril, ramipril, trandolapril -certain cancer medications such as anthracyclines (e.g., daunorubicin, doxorubicin), busulfan, cytarabine, paclitaxel, pentostatin, tamoxifen, trastuzumab -certain diuretics such as chlorothiazide, chlorthalidone, hydrochlorothiazide, indapamide, metolazone -certain medicines that treat or prevent blood clots like warfarin -certain muscle relaxants such as succinylcholine -cyclosporine -etanercept -indomethacin -medicines to increase blood counts like filgrastim, pegfilgrastim, sargramostim -medicines used as general  anesthesia -metronidazole -natalizumab This list may not describe all possible interactions. Give your health care provider a list of all the medicines, herbs, non-prescription drugs, or dietary supplements you use. Also tell them if you smoke, drink alcohol, or use illegal drugs. Some items may interact with your medicine. What should I watch for while using this medicine? Visit your doctor for checks on your progress. This drug may make you feel generally unwell. This is not uncommon, as chemotherapy can affect healthy cells as well as cancer cells. Report any side effects. Continue your course of treatment even though you feel ill unless your doctor tells you to stop. Drink water or other fluids as directed. Urinate often, even at night. In some cases, you may be given additional medicines to help with side effects. Follow all directions for their use. Call your doctor or health care professional for advice if you get a fever, chills or sore throat, or other symptoms of a cold or flu. Do not treat yourself. This drug decreases your body's ability to fight infections. Try to avoid being around people who are sick. This medicine may increase your risk to bruise or bleed. Call your doctor or health care professional if you notice any unusual bleeding. Be careful brushing and flossing your teeth or using a toothpick because you may get an infection or bleed more easily. If you have any dental work done, tell your dentist you are receiving this medicine. You may get drowsy or dizzy. Do not drive, use machinery, or do anything that needs mental alertness until you know how this medicine affects you. Do not become pregnant while taking this medicine or for 1 year after stopping it. Women should inform their doctor if they wish to become pregnant or think they might be pregnant. Men should not father a child while taking this medicine and for 4 months after stopping it. There is a potential for serious side  effects to an unborn child. Talk to your health care professional or pharmacist for more information. Do not breast-feed an infant while taking this medicine. This medicine may interfere with the ability to have a child. This medicine has caused ovarian failure in some women. This medicine has caused reduced sperm counts in some men. You should talk with your doctor or health care professional if you are concerned about your fertility. If you are going to have surgery, tell your doctor or health care professional that you have taken this medicine. What side effects may I notice from receiving this medicine? Side effects that you should report to your doctor or health care professional as soon as possible: -allergic reactions like skin rash, itching or hives, swelling of the face, lips, or tongue -low blood counts - this medicine may decrease the number of white blood cells, red blood cells and platelets. You may be at increased risk for infections and bleeding. -signs of infection - fever or chills, cough, sore throat, pain or difficulty passing urine -signs of decreased platelets or bleeding - bruising, pinpoint red spots on the skin, black, tarry stools, blood   in the urine -signs of decreased red blood cells - unusually weak or tired, fainting spells, lightheadedness -breathing problems -dark urine -dizziness -palpitations -swelling of the ankles, feet, hands -trouble passing urine or change in the amount of urine -weight gain -yellowing of the eyes or skin Side effects that usually do not require medical attention (report to your doctor or health care professional if they continue or are bothersome): -changes in nail or skin color -hair loss -missed menstrual periods -mouth sores -nausea, vomiting This list may not describe all possible side effects. Call your doctor for medical advice about side effects. You may report side effects to FDA at 1-800-FDA-1088. Where should I keep my  medicine? This drug is given in a hospital or clinic and will not be stored at home. NOTE: This sheet is a summary. It may not cover all possible information. If you have questions about this medicine, talk to your doctor, pharmacist, or health care provider.  2018 [employer]/Gold Standard (2012-10-30 16:22:58)  

## 2018-01-13 ENCOUNTER — Inpatient Hospital Stay: Payer: Commercial Managed Care - HMO

## 2018-01-13 ENCOUNTER — Ambulatory Visit: Payer: 59 | Admitting: Radiation Oncology

## 2018-01-13 ENCOUNTER — Other Ambulatory Visit: Payer: Self-pay | Admitting: Student

## 2018-01-14 ENCOUNTER — Other Ambulatory Visit: Payer: Self-pay | Admitting: Oncology

## 2018-01-14 ENCOUNTER — Other Ambulatory Visit: Payer: Self-pay | Admitting: Radiology

## 2018-01-14 ENCOUNTER — Ambulatory Visit
Admission: RE | Admit: 2018-01-14 | Discharge: 2018-01-14 | Disposition: A | Discharge status: Home / Self Care | Payer: 59 | Source: Ambulatory Visit | Attending: Oncology | Admitting: Oncology

## 2018-01-14 ENCOUNTER — Other Ambulatory Visit: Payer: 59

## 2018-01-14 DIAGNOSIS — C8330 Diffuse large B-cell lymphoma, unspecified site: Secondary | ICD-10-CM | POA: Insufficient documentation

## 2018-01-14 DIAGNOSIS — C8331 Diffuse large B-cell lymphoma, lymph nodes of head, face, and neck: Secondary | ICD-10-CM

## 2018-01-14 MED ORDER — TECHNETIUM TC 99M-LABELED RED BLOOD CELLS IV KIT
20.0000 | PACK | Freq: Once | INTRAVENOUS | Status: AC | PRN
  Administered 2018-01-14: 20.551 via INTRAVENOUS

## 2018-01-14 MED ORDER — LIDOCAINE-PRILOCAINE 2.5-2.5 % EX CREA: TOPICAL_CREAM | CUTANEOUS | 3 refills | Status: DC

## 2018-01-14 NOTE — Progress Notes (Signed)
START ON PATHWAY REGIMEN - Lymphoma and CLL     A cycle is every 21 days:     Rituximab      Cyclophosphamide      Doxorubicin      Vincristine      Prednisone   **Always confirm dose/schedule in your pharmacy ordering system**    Patient Characteristics: Diffuse Large B-Cell Lymphoma, First Line, Stage I and II, No Bulk Disease Type: Not Applicable Disease Type: Diffuse Large B-Cell Lymphoma Disease Type: Not Applicable Line of therapy: First Line [place] Stage: IIE Disease Characteristics: No Bulk Intent of Therapy: Curative Intent, Discussed with Patient

## 2018-01-15 ENCOUNTER — Other Ambulatory Visit: Payer: Self-pay | Admitting: General Surgery

## 2018-01-15 ENCOUNTER — Telehealth: Payer: Self-pay | Admitting: *Deleted

## 2018-01-15 ENCOUNTER — Ambulatory Visit
Admission: RE | Admit: 2018-01-15 | Discharge: 2018-01-15 | Disposition: A | Discharge status: Home / Self Care | Payer: Commercial Managed Care - HMO | Source: Ambulatory Visit | Attending: Oncology | Admitting: Oncology

## 2018-01-15 DIAGNOSIS — Z923 Personal history of irradiation: Secondary | ICD-10-CM | POA: Insufficient documentation

## 2018-01-15 DIAGNOSIS — Z85828 Personal history of other malignant neoplasm of skin: Secondary | ICD-10-CM | POA: Insufficient documentation

## 2018-01-15 DIAGNOSIS — C8590 Non-Hodgkin lymphoma, unspecified, unspecified site: Secondary | ICD-10-CM | POA: Diagnosis present

## 2018-01-15 DIAGNOSIS — Z7951 Long term (current) use of inhaled steroids: Secondary | ICD-10-CM | POA: Insufficient documentation

## 2018-01-15 DIAGNOSIS — Z853 Personal history of malignant neoplasm of breast: Secondary | ICD-10-CM | POA: Insufficient documentation

## 2018-01-15 DIAGNOSIS — Z7952 Long term (current) use of systemic steroids: Secondary | ICD-10-CM | POA: Diagnosis not present

## 2018-01-15 DIAGNOSIS — C8330 Diffuse large B-cell lymphoma, unspecified site: Secondary | ICD-10-CM | POA: Insufficient documentation

## 2018-01-15 DIAGNOSIS — Z8572 Personal history of non-Hodgkin lymphomas: Secondary | ICD-10-CM | POA: Diagnosis not present

## 2018-01-15 DIAGNOSIS — Z79899 Other long term (current) drug therapy: Secondary | ICD-10-CM | POA: Insufficient documentation

## 2018-01-15 DIAGNOSIS — Z801 Family history of malignant neoplasm of trachea, bronchus and lung: Secondary | ICD-10-CM | POA: Diagnosis not present

## 2018-01-15 DIAGNOSIS — Z5111 Encounter for antineoplastic chemotherapy: Secondary | ICD-10-CM | POA: Diagnosis not present

## 2018-01-15 DIAGNOSIS — Z79811 Long term (current) use of aromatase inhibitors: Secondary | ICD-10-CM | POA: Insufficient documentation

## 2018-01-15 DIAGNOSIS — Z8041 Family history of malignant neoplasm of ovary: Secondary | ICD-10-CM | POA: Diagnosis not present

## 2018-01-15 HISTORY — PX: IR FLUORO GUIDE PORT INSERTION RIGHT: IMG5741

## 2018-01-15 LAB — CBC
HCT: 43.1 % (ref 35.0–47.0)
Hemoglobin: 14.4 g/dL (ref 12.0–16.0)
MCH: 28.8 pg (ref 26.0–34.0)
MCHC: 33.5 g/dL (ref 32.0–36.0)
MCV: 85.9 fL (ref 80.0–100.0)
PLATELETS: 277 10*3/uL (ref 150–440)
RBC: 5.02 MIL/uL (ref 3.80–5.20)
RDW: 12.9 % (ref 11.5–14.5)
WBC: 10.1 10*3/uL (ref 3.6–11.0)

## 2018-01-15 LAB — APTT: APTT: 27 s (ref 24–36)

## 2018-01-15 LAB — PROTIME-INR
INR: 1.02
Prothrombin Time: 13.3 seconds (ref 11.4–15.2)

## 2018-01-15 MED ORDER — HEPARIN (PORCINE) IN NACL 2-0.9 UNIT/ML-% IJ SOLN
INTRAMUSCULAR | Status: AC
  Filled 2018-01-15: qty 500

## 2018-01-15 MED ORDER — PROCHLORPERAZINE MALEATE 10 MG PO TABS: 10.0000 mg | ORAL_TABLET | Freq: Four times a day (QID) | ORAL | 3 refills | Status: DC | PRN

## 2018-01-15 MED ORDER — LIDOCAINE-PRILOCAINE 2.5-2.5 % EX CREA
TOPICAL_CREAM | CUTANEOUS | Status: AC | PRN
  Administered 2018-01-15: 1 via TOPICAL

## 2018-01-15 MED ORDER — MIDAZOLAM HCL 5 MG/5ML IJ SOLN
INTRAMUSCULAR | Status: AC
  Filled 2018-01-15: qty 10

## 2018-01-15 MED ORDER — MIDAZOLAM HCL 5 MG/5ML IJ SOLN
INTRAMUSCULAR | Status: AC | PRN
  Administered 2018-01-15 (×2): 1 mg via INTRAVENOUS
  Administered 2018-01-15: 0.5 mg via INTRAVENOUS

## 2018-01-15 MED ORDER — CEFAZOLIN SODIUM-DEXTROSE 2-4 GM/100ML-% IV SOLN
2.0000 g | INTRAVENOUS | Status: AC
  Administered 2018-01-15: 2 g via INTRAVENOUS

## 2018-01-15 MED ORDER — LIDOCAINE HCL (PF) 1 % IJ SOLN
INTRAMUSCULAR | Status: AC | PRN
  Administered 2018-01-15: 10 mL

## 2018-01-15 MED ORDER — SODIUM CHLORIDE 0.9 % IV SOLN
INTRAVENOUS | Status: DC
  Administered 2018-01-15: 14:00:00 via INTRAVENOUS

## 2018-01-15 MED ORDER — HEPARIN SOD (PORK) LOCK FLUSH 100 UNIT/ML IV SOLN
INTRAVENOUS | Status: AC
  Filled 2018-01-15: qty 5

## 2018-01-15 MED ORDER — PREDNISONE 50 MG PO TABS: 100.0000 mg | ORAL_TABLET | Freq: Every day | ORAL | 0 refills | Status: DC

## 2018-01-15 MED ORDER — LIDOCAINE-EPINEPHRINE (PF) 1 %-1:200000 IJ SOLN
INTRAMUSCULAR | Status: AC
  Filled 2018-01-15: qty 30

## 2018-01-15 MED ORDER — FENTANYL CITRATE (PF) 100 MCG/2ML IJ SOLN
INTRAMUSCULAR | Status: AC
  Filled 2018-01-15: qty 4

## 2018-01-15 MED ORDER — FENTANYL CITRATE (PF) 100 MCG/2ML IJ SOLN
INTRAMUSCULAR | Status: AC | PRN
  Administered 2018-01-15: 50 ug via INTRAVENOUS
  Administered 2018-01-15: 25 ug via INTRAVENOUS

## 2018-01-15 MED ORDER — LIDOCAINE-PRILOCAINE 2.5-2.5 % EX CREA
TOPICAL_CREAM | CUTANEOUS | Status: AC
  Filled 2018-01-15: qty 5

## 2018-01-15 MED ORDER — LIDOCAINE-PRILOCAINE 2.5-2.5 % EX CREA
TOPICAL_CREAM | CUTANEOUS | Status: DC
  Filled 2018-01-15: qty 5

## 2018-01-15 MED ORDER — LORAZEPAM 0.5 MG PO TABS: 0.5000 mg | ORAL_TABLET | Freq: Four times a day (QID) | ORAL | 0 refills | Status: DC | PRN

## 2018-01-15 MED ORDER — ONDANSETRON HCL 8 MG PO TABS: 8.0000 mg | ORAL_TABLET | Freq: Two times a day (BID) | ORAL | 1 refills | Status: DC | PRN

## 2018-01-15 NOTE — Telephone Encounter (Signed)
I looked at her meds for home that was released and I did not see anything but EMLA cream.  I am entering them in computer and calling in the lorazepam.  When I called the pt to let her know not to start the prednisone tom. Since dr Janese Banks gave it to her earlier.  She states that she did start it after pet on Monday but because of so many things she had to do this week and some of them were NPO she had not taken 5 pills every day. I told her to tell Janese Banks how many she took so she can decide what to do about prednisone dosage.  I will go ahead and send the rx in for prednisone but let Dr. Janese Banks speak with pt tom and decide about prednisone dosing for this time

## 2018-01-15 NOTE — Consult Note (Signed)
Chief Complaint: New Dx of lymphoma, in need of port placement  Referring Physician(s): Rao,Archana C  Patient Status: [facility] - Out-pt  History of Present Illness: Victoria Benson is a 62 y.o. female with past history significant for breast cancer with recent diagnosis of lymphoma who presents today for image guided portacatheter placement. The patient is accompanied by her husband though serves are her own historian.  Patient currently without complaint. Specifically, no chest pain, shortness of breath, fever or chills.  Past Medical History:  Diagnosis Date  . BRCA gene mutation negative 06/2016  . Breast cancer ([place]) 2009   T1c (1.2 cm) N0 ER 90%, PR 90%, HER-2/neu not amplified. Oncotype DX score, low risk (14) 9% chance of recurrence with antiestrogen therapy alone.  . Breast cancer ([place]) 06-17-16   INVASIVE MAMMARY CARCINOMA . T1, ER positive, PR positive, HER-2/neu not overexpressed  . Family history of adverse reaction to anesthesia    PTS MOM-HARD TO WAKE UP  . Heart murmur   . Radiation 2009   BREAST CA  . Skin cancer     Past Surgical History:  Procedure Laterality Date  . BREAST BIOPSY Right 06/17/2016   invasive. T1, ER positive, PR positive, HER-2/neu not overexpressed  . BREAST CYST ASPIRATION Left 2002  . BREAST EXCISIONAL BIOPSY Right 06/2016   lumpectomy with rad   . BREAST EXCISIONAL BIOPSY Right 2009   breast ca +  . BREAST LUMPECTOMY WITH NEEDLE LOCALIZATION Right 07/18/2016   Procedure: BREAST LUMPECTOMY WITH NEEDLE LOCALIZATION AND RIGHT AXILLARY EXPLORATION;  Surgeon: Robert Bellow, MD;  Location: [facility] ORS;  Service: General;  Laterality: Right;  . BREAST SURGERY Right July 2009   Wide excision, sentinel left node biopsy MammoSite. ( 06/21/2008  . CHOLECYSTECTOMY  1984  . DILATION AND CURETTAGE OF UTERUS  2004  . ENDOMETRIAL ABLATION  2005  . TUMOR REMOVAL  12/2013   from uterus done by [facility]    Allergies: Patient has no known  allergies.  Medications: Prior to Admission medications   Medication Sig Start Date End Date Taking? Authorizing Provider  allopurinol (ZYLOPRIM) 300 MG tablet Take 300 mg by mouth daily.   Yes [provider]  anastrozole (ARIMIDEX) 1 MG tablet Take 1 tablet (1 mg total) by mouth daily. 01/20/17  Yes Byrnett, Forest Gleason, MD  CALCIUM-MAGNESIUM-VITAMIN D ER PO Take 1 tablet by mouth daily.   Yes [provider]  cetirizine (ZYRTEC) 10 MG tablet Take 10 mg daily by mouth.   Yes [provider]  fluticasone (FLONASE) 50 MCG/ACT nasal spray Place 2 sprays daily into both nostrils. 11/17/17  Yes Bacigalupo, Dionne Bucy, MD  Multiple Vitamin (MULTIVITAMIN) capsule Take 1 capsule by mouth daily.   Yes [provider]  predniSONE (DELTASONE) 20 MG tablet Take 20 mg by mouth daily with breakfast.   Yes [provider]  lidocaine-prilocaine (EMLA) cream Apply to affected area once 01/14/18   Sindy Guadeloupe, MD  Omega-3 Krill Oil 500 MG CAPS Take 1,000 mg by mouth daily.     [provider]     Family History  Problem Relation Age of Onset  . CAD Mother   . Osteoporosis Mother   . Lung cancer Father   . Diabetes Father   . Hypertension Brother   . Ovarian cancer Maternal Grandmother   . Heart attack Paternal Grandmother   . Diabetes Paternal Grandfather   . Fibromyalgia Brother   . Hypertension Brother   .  Hypertension Brother   . Sleep apnea Brother   . Healthy Brother   . Sleep apnea Brother   . Ovarian cancer Other   . Breast cancer Neg Hx     Social History   Socioeconomic History  . Marital status: Married    Spouse name: [name]  . Number of children: 2  . Years of education: 67  . Highest education level: None  Social Needs  . Financial resource strain: None  . Food insecurity - worry: None  . Food insecurity - inability: None  . Transportation needs - medical: None  . Transportation needs - non-medical: None  Occupational  History  . Occupation: unemployed  Tobacco Use  . Smoking status: Never Smoker  . Smokeless tobacco: Never Used  Substance and Sexual Activity  . Alcohol use: No  . Drug use: No  . Sexual activity: Yes  Other Topics Concern  . None  Social History Narrative  . None    ECOG Status: 1 - Symptomatic but completely ambulatory  Review of Systems: A 12 point ROS discussed and pertinent positives are indicated in the HPI above.  All other systems are negative.  Review of Systems  Constitutional: Negative.   Respiratory: Negative.   Cardiovascular: Negative.   Gastrointestinal: Negative.     Vital Signs: BP 124/63   Pulse 67   Temp 97.9 F (36.6 C) (Oral)   Resp 10   SpO2 100%   Physical Exam  Constitutional: She appears well-developed and well-nourished.  HENT:  Head: Normocephalic and atraumatic.  Cardiovascular: Normal rate and regular rhythm.  Pulmonary/Chest: Effort normal and breath sounds normal.  Skin: Skin is warm and dry.  Psychiatric: She has a normal mood and affect. Her behavior is normal.  Nursing note and vitals reviewed.   Imaging: Nm Cardiac Muga Rest  Result Date: 01/14/2018 CLINICAL DATA:  Diffuse large B-cell lymphoma, pre cardiotoxic chemotherapy EXAM: NUCLEAR MEDICINE CARDIAC BLOOD POOL IMAGING (MUGA) TECHNIQUE: Cardiac multi-gated acquisition was performed at rest following intravenous injection of Tc-13mlabeled red blood cells. RADIOPHARMACEUTICALS:  20.551 mCi Tc-948mertechnetate in-vitro labeled autologous red blood cells IV COMPARISON:  None FINDINGS: LEFT ventricular ejection fraction is calculated at 53%. Study was obtained at a cardiac rate of 67 beats per minute. Patient was rhythmic during imaging. No focal LEFT ventricular wall motion abnormalities identified. IMPRESSION: Calculated LEFT ventricular ejection fraction of 53%. Normal LEFT ventricular wall motion. Electronically Signed   By: MaLavonia Dana.D.   On: 01/14/2018 15:48   Nm Pet  Image Initial (pi) Skull Base To Thigh  Result Date: 01/12/2018 CLINICAL DATA:  Initial treatment strategy for diffuse large B-cell lymphoma. EXAM: NUCLEAR MEDICINE PET SKULL BASE TO THIGH TECHNIQUE: 12.2 mCi F-18 FDG was injected intravenously. Full-ring PET imaging was performed from the skull base to thigh after the radiotracer. CT data was obtained and used for attenuation correction and anatomic localization. FASTING BLOOD GLUCOSE:  Value: 96 mg/dl COMPARISON:  Neck CT 12/15/2017. FINDINGS: NECK: The asymmetric fullness seen previously in the right tonsillar fossa is also asymmetrically hypermetabolic with SUV max = 1265.7Uptake in the contralateral tonsillar fossa has SUV max = 6.5. The level II/III lymphadenopathy identified in the right neck on the previous CT demonstrates hypermetabolism. Discrete level II lymph node seen on image 30 series 3 measures 12 mm and demonstrates SUV max = 11.0. Dominant right-sided level II node seen on image 38 shows central photopenia compatible with necrosis. Discrete 10 mm posterior right level III lymph  node seen on image 39 demonstrates SUV max = 12.7. No evidence for hypermetabolic lymphadenopathy in the left neck. CHEST: No hypermetabolic mediastinal or hilar nodes. No suspicious pulmonary nodules on the CT scan. ABDOMEN/PELVIS: No abnormal hypermetabolic activity within the liver, pancreas, adrenal glands, or spleen. No hypermetabolic lymph nodes in the abdomen or pelvis. Atherosclerotic calcification noted in the abdominal aorta without aneurysm. SKELETON: No focal hypermetabolic activity to suggest skeletal metastasis. Sclerotic lesion in the L3 vertebral body shows no hypermetabolism on PET imaging. IMPRESSION: 1. Hypermetabolism identified in the tonsillar fossa bilaterally, right greater than left. Hypermetabolism on today's study corresponds to the lesion seen on the recent diagnostic CT of the neck. 2. Hypermetabolic lymphadenopathy in the right neck (level II  and III). No evidence for hypermetabolic lymphadenopathy in the contralateral neck. 3. No hypermetabolic disease in the chest, abdomen, or pelvis. No evidence for hypermetabolic disease in the skeleton. Electronically Signed   By: Misty Stanley M.D.   On: 01/12/2018 09:31   [place] Core Biopsy (lymph Nodes)  Result Date: 12/29/2017 CLINICAL DATA:  Right cervical adenopathy. History of breast carcinoma. EXAM: ULTRASOUND GUIDED CORE BIOPSY OF RIGHT CERVICAL ADENOPATHY MEDICATIONS: Intravenous Fentanyl and Versed were administered as conscious sedation during continuous monitoring of the patient's level of consciousness and physiological / cardiorespiratory status by the radiology RN, with a total moderate sedation time of 20 minutes. PROCEDURE: The procedure, risks, benefits, and alternatives were explained to the patient. Questions regarding the procedure were encouraged and answered. The patient understands and consents to the procedure. Survey ultrasound of the right cervical region was performed and the level 2/3 adenopathy was localized. An appropriate skin entry site was determined and marked. The operative field was prepped with chlorhexidine in a sterile fashion, and a sterile drape was applied covering the operative field. A sterile gown and sterile gloves were used for the procedure. Local anesthesia was provided with 1% Lidocaine. Under real-time ultrasound guidance, a 17 gauge trocar needle was advanced to the margin of the lesion. Once needle tip position was confirmed, coaxial 18-gauge core biopsy samples were obtained, submitted in saline to surgical pathology. The guide needle was removed. Postprocedure scans show no hemorrhage or other apparent complication. The patient tolerated the procedure well. COMPLICATIONS: None. FINDINGS: Hypoechoic right cervical adenopathy was again localized corresponding to CT findings. Representative core biopsies obtained as above. IMPRESSION: 1. Technically successful  ultrasound-guided core biopsy, right cervical adenopathy. Electronically Signed   By: Lucrezia Europe M.D.   On: 12/29/2017 14:21    Labs:  CBC: Recent Labs    03/25/17 0848 12/29/17 1239 01/08/18 1040 01/15/18 1252  WBC 5.0 7.4 12.1* 10.1  HGB 14.2 15.3 13.9 14.4  HCT 43.4 45.8 41.5 43.1  PLT 201 194 247 277    COAGS: Recent Labs    12/29/17 1239 01/15/18 1252  INR 1.01 1.02  APTT 30 27    BMP: Recent Labs    03/25/17 0848 12/15/17 0853 01/08/18 1040  NA 141  --  141  K 4.6  --  3.5  CL 99  --  102  CO2 28  --  29  GLUCOSE 84  --  95  BUN 9  --  14  CALCIUM 9.4  --  9.9  CREATININE 0.64 0.60 0.57  GFRNONAA 98  --  >60  GFRAA 113  --  >60    LIVER FUNCTION TESTS: Recent Labs    03/25/17 0848 01/08/18 1040  BILITOT 0.7 0.6  AST 17 16  ALT 17 18  ALKPHOS 92 84  PROT 6.8 7.8  ALBUMIN 4.5 4.1    TUMOR MARKERS: No results for input(s): AFPTM, CEA, CA199, CHROMGRNA in the last 8760 hours.  Assessment and Plan:  Victoria Benson is a 62 y.o. female with past history significant for breast cancer with recent diagnosis of lymphoma who presents today for image guided portacatheter placement.   Risks and benefits of port placement were discussed with the patient including, but not limited to bleeding, infection, pneumothorax, or fibrin sheath development and need for additional procedures.  Patient does have a remote history of right-sided breast cancer, however this is without associated right upper extremity lymphedema and as such, I feel she would benefit from a right internal jugular approach portacatheter as right-sided port-a-catheters tend to be more durable (specifically, a reduction in catheter retraction and development of a fibrin sheath).   All of the patient's questions were answered, patient is agreeable to proceed.  Consent signed and in chart.   Thank you for this interesting consult.  I greatly enjoyed meeting Naleyah M Denker and look forward to  participating in their care.  A copy of this report was sent to the requesting provider on this date.  Electronically Signed: Sandi Mariscal, MD 01/15/2018, 2:09 PM   I spent a total of 15 Minutes in face to face in clinical consultation, greater than 50% of which was counseling/coordinating care for port placement.

## 2018-01-15 NOTE — Telephone Encounter (Signed)
-----   Message from Sindy Guadeloupe, MD sent at 01/14/2018  1:48 PM EST ----- Done!!  sherry- I have released her meds and labs. Just FY. She will not take her prednsione with cycle 1 as she has already taken it   ----- Message ----- From: Floy Sabina Sent: 01/14/2018   1:35 PM To: Sindy Guadeloupe, MD  Dr. Janese Banks:  This patient is on the schedule for the 18th for chemo. I do not see a care plan.  Please put this in so I can get approval.  Thanks, Aleen Sells

## 2018-01-15 NOTE — Procedures (Signed)
Pre Procedure Dx: Lymphoma Post Procedural Dx: Same  Successful placement of right IJ approach port-a-cath with tip at the superior caval atrial junction. The catheter is ready for immediate use.  Estimated Blood Loss: Minimal  Complications: None immediate.  Jay Nikolai Wilczak, MD Pager #: [phone]   

## 2018-01-16 ENCOUNTER — Inpatient Hospital Stay (HOSPITAL_BASED_OUTPATIENT_CLINIC_OR_DEPARTMENT_OTHER): Payer: Commercial Managed Care - HMO | Admitting: Oncology

## 2018-01-16 ENCOUNTER — Inpatient Hospital Stay: Payer: Commercial Managed Care - HMO

## 2018-01-16 ENCOUNTER — Encounter: Payer: Self-pay | Admitting: Oncology

## 2018-01-16 VITALS — BP 117/75 | HR 78 | Temp 97.9°F | Resp 18 | Wt 179.5 lb

## 2018-01-16 VITALS — BP 118/74 | HR 92 | Resp 20

## 2018-01-16 DIAGNOSIS — I872 Venous insufficiency (chronic) (peripheral): Secondary | ICD-10-CM | POA: Diagnosis not present

## 2018-01-16 DIAGNOSIS — Z923 Personal history of irradiation: Secondary | ICD-10-CM

## 2018-01-16 DIAGNOSIS — E559 Vitamin D deficiency, unspecified: Secondary | ICD-10-CM

## 2018-01-16 DIAGNOSIS — M79606 Pain in leg, unspecified: Secondary | ICD-10-CM

## 2018-01-16 DIAGNOSIS — R011 Cardiac murmur, unspecified: Secondary | ICD-10-CM

## 2018-01-16 DIAGNOSIS — Z5111 Encounter for antineoplastic chemotherapy: Secondary | ICD-10-CM | POA: Diagnosis not present

## 2018-01-16 DIAGNOSIS — Z8041 Family history of malignant neoplasm of ovary: Secondary | ICD-10-CM

## 2018-01-16 DIAGNOSIS — E039 Hypothyroidism, unspecified: Secondary | ICD-10-CM

## 2018-01-16 DIAGNOSIS — E042 Nontoxic multinodular goiter: Secondary | ICD-10-CM

## 2018-01-16 DIAGNOSIS — Z7689 Persons encountering health services in other specified circumstances: Secondary | ICD-10-CM

## 2018-01-16 DIAGNOSIS — Z801 Family history of malignant neoplasm of trachea, bronchus and lung: Secondary | ICD-10-CM

## 2018-01-16 DIAGNOSIS — I8393 Asymptomatic varicose veins of bilateral lower extremities: Secondary | ICD-10-CM

## 2018-01-16 DIAGNOSIS — K589 Irritable bowel syndrome without diarrhea: Secondary | ICD-10-CM

## 2018-01-16 DIAGNOSIS — Z79811 Long term (current) use of aromatase inhibitors: Secondary | ICD-10-CM

## 2018-01-16 DIAGNOSIS — Z7952 Long term (current) use of systemic steroids: Secondary | ICD-10-CM

## 2018-01-16 DIAGNOSIS — Z170 Estrogen receptor positive status [ER+]: Secondary | ICD-10-CM

## 2018-01-16 DIAGNOSIS — Z85828 Personal history of other malignant neoplasm of skin: Secondary | ICD-10-CM

## 2018-01-16 DIAGNOSIS — Z9049 Acquired absence of other specified parts of digestive tract: Secondary | ICD-10-CM

## 2018-01-16 DIAGNOSIS — Z5112 Encounter for antineoplastic immunotherapy: Secondary | ICD-10-CM | POA: Diagnosis not present

## 2018-01-16 DIAGNOSIS — C50211 Malignant neoplasm of upper-inner quadrant of right female breast: Secondary | ICD-10-CM

## 2018-01-16 DIAGNOSIS — Z5181 Encounter for therapeutic drug level monitoring: Secondary | ICD-10-CM

## 2018-01-16 DIAGNOSIS — R1310 Dysphagia, unspecified: Secondary | ICD-10-CM

## 2018-01-16 DIAGNOSIS — M25562 Pain in left knee: Secondary | ICD-10-CM | POA: Diagnosis not present

## 2018-01-16 DIAGNOSIS — E871 Hypo-osmolality and hyponatremia: Secondary | ICD-10-CM

## 2018-01-16 DIAGNOSIS — C8330 Diffuse large B-cell lymphoma, unspecified site: Secondary | ICD-10-CM

## 2018-01-16 DIAGNOSIS — E785 Hyperlipidemia, unspecified: Secondary | ICD-10-CM

## 2018-01-16 DIAGNOSIS — C8331 Diffuse large B-cell lymphoma, lymph nodes of head, face, and neck: Secondary | ICD-10-CM

## 2018-01-16 DIAGNOSIS — Z79899 Other long term (current) drug therapy: Secondary | ICD-10-CM

## 2018-01-16 DIAGNOSIS — Z79620 Long term (current) use of immunosuppressive biologic: Secondary | ICD-10-CM

## 2018-01-16 LAB — CBC WITH DIFFERENTIAL/PLATELET
BASOS PCT: 1 %
Basophils Absolute: 0 10*3/uL (ref 0–0.1)
EOS ABS: 0.1 10*3/uL (ref 0–0.7)
EOS PCT: 1 %
HCT: 40.5 % (ref 35.0–47.0)
Hemoglobin: 13.8 g/dL (ref 12.0–16.0)
LYMPHS ABS: 1.9 10*3/uL (ref 1.0–3.6)
Lymphocytes Relative: 19 %
MCH: 29.1 pg (ref 26.0–34.0)
MCHC: 34 g/dL (ref 32.0–36.0)
MCV: 85.6 fL (ref 80.0–100.0)
MONOS PCT: 7 %
Monocytes Absolute: 0.7 10*3/uL (ref 0.2–0.9)
Neutro Abs: 7 10*3/uL — ABNORMAL HIGH (ref 1.4–6.5)
Neutrophils Relative %: 72 %
PLATELETS: 266 10*3/uL (ref 150–440)
RBC: 4.73 MIL/uL (ref 3.80–5.20)
RDW: 12.9 % (ref 11.5–14.5)
WBC: 9.8 10*3/uL (ref 3.6–11.0)

## 2018-01-16 LAB — COMPREHENSIVE METABOLIC PANEL
ALBUMIN: 3.7 g/dL (ref 3.5–5.0)
ALK PHOS: 85 U/L (ref 38–126)
ALT: 18 U/L (ref 14–54)
AST: 21 U/L (ref 15–41)
Anion gap: 9 (ref 5–15)
BILIRUBIN TOTAL: 0.3 mg/dL (ref 0.3–1.2)
BUN: 13 mg/dL (ref 6–20)
CALCIUM: 9.5 mg/dL (ref 8.9–10.3)
CO2: 29 mmol/L (ref 22–32)
CREATININE: 0.6 mg/dL (ref 0.44–1.00)
Chloride: 100 mmol/L — ABNORMAL LOW (ref 101–111)
GFR calc Af Amer: >60 mL/min (ref >60)
GLUCOSE: 118 mg/dL — AB (ref 65–99)
Potassium: 3.5 mmol/L (ref 3.5–5.1)
Sodium: 138 mmol/L (ref 135–145)
TOTAL PROTEIN: 6.8 g/dL (ref 6.5–8.1)

## 2018-01-16 LAB — SURGICAL PATHOLOGY

## 2018-01-16 MED ORDER — ACETAMINOPHEN 325 MG PO TABS
650.0000 mg | ORAL_TABLET | Freq: Once | ORAL | Status: AC
  Administered 2018-01-16: 650 mg via ORAL
  Filled 2018-01-16: qty 2

## 2018-01-16 MED ORDER — SODIUM CHLORIDE 0.9 % IV SOLN
Freq: Once | INTRAVENOUS | Status: AC
  Administered 2018-01-16: 10:00:00 via INTRAVENOUS
  Filled 2018-01-16: qty 1000

## 2018-01-16 MED ORDER — METHYLPREDNISOLONE SODIUM SUCC 125 MG IJ SOLR
125.0000 mg | Freq: Once | INTRAMUSCULAR | Status: AC | PRN
  Administered 2018-01-16: 125 mg via INTRAVENOUS

## 2018-01-16 MED ORDER — SODIUM CHLORIDE 0.9% FLUSH
10.0000 mL | INTRAVENOUS | Status: DC | PRN
  Filled 2018-01-16: qty 10

## 2018-01-16 MED ORDER — SODIUM CHLORIDE 0.9 % IV SOLN
2.0000 mg | Freq: Once | INTRAVENOUS | Status: AC
  Administered 2018-01-16: 2 mg via INTRAVENOUS
  Filled 2018-01-16: qty 2

## 2018-01-16 MED ORDER — SODIUM CHLORIDE 0.9 % IV SOLN: 375.0000 mg/m2 | Freq: Once | INTRAVENOUS | Status: DC

## 2018-01-16 MED ORDER — DIPHENHYDRAMINE HCL 25 MG PO CAPS
50.0000 mg | ORAL_CAPSULE | Freq: Once | ORAL | Status: AC
  Administered 2018-01-16: 50 mg via ORAL
  Filled 2018-01-16: qty 2

## 2018-01-16 MED ORDER — DEXAMETHASONE SODIUM PHOSPHATE 10 MG/ML IJ SOLN
10.0000 mg | Freq: Once | INTRAMUSCULAR | Status: AC
  Administered 2018-01-16: 10 mg via INTRAVENOUS
  Filled 2018-01-16: qty 1

## 2018-01-16 MED ORDER — SODIUM CHLORIDE 0.9 % IV SOLN
375.0000 mg/m2 | Freq: Once | INTRAVENOUS | Status: AC
  Administered 2018-01-16: 700 mg via INTRAVENOUS
  Filled 2018-01-16: qty 50

## 2018-01-16 MED ORDER — DOXORUBICIN HCL CHEMO IV INJECTION 2 MG/ML
50.0000 mg/m2 | Freq: Once | INTRAVENOUS | Status: AC
  Administered 2018-01-16: 96 mg via INTRAVENOUS
  Filled 2018-01-16: qty 48

## 2018-01-16 MED ORDER — PALONOSETRON HCL INJECTION 0.25 MG/5ML
0.2500 mg | Freq: Once | INTRAVENOUS | Status: AC
  Administered 2018-01-16: 0.25 mg via INTRAVENOUS
  Filled 2018-01-16: qty 5

## 2018-01-16 MED ORDER — SODIUM CHLORIDE 0.9 % IV SOLN
1500.0000 mg | Freq: Once | INTRAVENOUS | Status: AC
  Administered 2018-01-16: 1500 mg via INTRAVENOUS
  Filled 2018-01-16: qty 50

## 2018-01-16 MED ORDER — PEGFILGRASTIM 6 MG/0.6ML ~~LOC~~ PSKT
6.0000 mg | PREFILLED_SYRINGE | Freq: Once | SUBCUTANEOUS | Status: AC
  Administered 2018-01-16: 6 mg via SUBCUTANEOUS
  Filled 2018-01-16: qty 0.6

## 2018-01-16 MED ORDER — HEPARIN SOD (PORK) LOCK FLUSH 100 UNIT/ML IV SOLN
500.0000 [IU] | Freq: Once | INTRAVENOUS | Status: AC | PRN
  Administered 2018-01-16: 500 [IU]
  Filled 2018-01-16: qty 5

## 2018-01-16 MED ORDER — SODIUM CHLORIDE 0.9 % IV SOLN: 10.0000 mg | Freq: Once | INTRAVENOUS | Status: DC

## 2018-01-16 MED ORDER — DIPHENHYDRAMINE HCL 50 MG/ML IJ SOLN
25.0000 mg | Freq: Once | INTRAMUSCULAR | Status: AC | PRN
  Administered 2018-01-16: 25 mg via INTRAVENOUS

## 2018-01-16 NOTE — Progress Notes (Signed)
Patient started on Rituxan at 1145. At 1345 upon completion of 3rd rate increase of 150 mg/hr pt began complaining of nasal congestion and "ears and head feeling full". Rituxan stopped, 125mg  Solumedrol IV given and 25mg  IV Benadryl given. Dr Janese Banks called and came over to see patient. VSS. Instructed to hold treatment for  20 minutes and restart at previous rate if patient's symptoms resolve. 1415 Rituxan restarted. Will continue to monitor

## 2018-01-16 NOTE — Progress Notes (Signed)
Hematology/Oncology Consult note [facility]  [phone] [phone]  Patient Care Team: Virginia Crews, MD as PCP - General (Family Medicine) Margarita Rana, MD as Referring Physician (Family Medicine) Bary Castilla Forest Gleason, MD (General Surgery)   Name of the patient: Victoria Benson  [id number]  [birth year]   Date of visit: 01/16/18  Diagnosis- Stage II E DLBCL. Abc subtype. Not double hit. Bone marrow biopsy pending  Chief complaint/ Reason for visit- on treatment assessment prior to cycle 1 of RCHOP  Heme/Onc history: patient is a 62 year old female who developed symptoms of upper respiratory infection and nasal congestion sometime in November 2018.  Around the same time she also developed a right neck mass which was initially attributed to possible tonsillitis and she was given antibiotics for the same.  However her right neck mass did not decrease in size and she eventually saw Dr. Pryor Ochoa from ENT.  Patient was found to have significant right cervical adenopathy and underwent CT of the soft tissue neck which showed right level 2 and 3 adenopathy favoring metastatic carcinoma.  Jugulodigastric node measuring up to 3.7 cm.  Asymmetric fullness of the right tonsillar fossa possibly primary site.   Patient underwent core biopsy of the right cervical lymph node.     Pathology showed diffuse large B-cell lymphoma of the ABC subtype.  IHC was positive for BCL-2 and BCL 6 see Mick IHC was not tested due to insufficiency of the testing sample.  Ki-67 was high 80-90%.  Patient reports feeling well prior to November 2018.  Currently she denies any fevers, chills, unintentional weight loss or drenching night sweats.  She reports some difficulty swallowing because of the right neck mass but denies any shortness of breath.  She did receive 1 dose of 100 mg prednisone yesterday and reports that her mass feels smaller and swallowing is easier.  PET/CT  scan showed hypermetabolic them in the tonsillar fossa bilaterally right greater than left as well as hypermetabolic lymphadenopathy in the right neck at the level 2 and level 3.  No evidence of contralateral cervical adenopathy and no evidence of metastatic disease elsewhere   Interval history-patient has been taking her prednisone for the last 4 days.  However she has not taken 100 mg and has been averaging about 50 mg/day.  She has taken 50 mg this morning.  Reports that her neck swelling has already gone down.  Denies any difficulty swallowing.  Denies any pain  ECOG PS- 0 Pain scale- 0   Review of systems- Review of Systems  Constitutional: Negative for chills, fever, malaise/fatigue and weight loss.  HENT: Negative for congestion, ear discharge and nosebleeds.   Eyes: Negative for blurred vision.  Respiratory: Negative for cough, hemoptysis, sputum production, shortness of breath and wheezing.   Cardiovascular: Negative for chest pain, palpitations, orthopnea and claudication.  Gastrointestinal: Negative for abdominal pain, blood in stool, constipation, diarrhea, heartburn, melena, nausea and vomiting.  Genitourinary: Negative for dysuria, flank pain, frequency, hematuria and urgency.  Musculoskeletal: Negative for back pain, joint pain and myalgias.  Skin: Negative for rash.  Neurological: Negative for dizziness, tingling, focal weakness, seizures, weakness and headaches.  Endo/Heme/Allergies: Does not bruise/bleed easily.  Psychiatric/Behavioral: Negative for depression and suicidal ideas. The patient does not have insomnia.       No Known Allergies   Past Medical History:  Diagnosis Date  . BRCA gene mutation negative 06/2016  . Breast cancer ([place]) 2009   T1c (1.2 cm) N0  ER 90%, PR 90%, HER-2/neu not amplified. Oncotype DX score, low risk (14) 9% chance of recurrence with antiestrogen therapy alone.  . Breast cancer ([place]) 06-17-16   INVASIVE MAMMARY CARCINOMA . T1, ER  positive, PR positive, HER-2/neu not overexpressed  . Family history of adverse reaction to anesthesia    PTS MOM-HARD TO WAKE UP  . Heart murmur   . Radiation 2009   BREAST CA  . Skin cancer      Past Surgical History:  Procedure Laterality Date  . BREAST BIOPSY Right 06/17/2016   invasive. T1, ER positive, PR positive, HER-2/neu not overexpressed  . BREAST CYST ASPIRATION Left 2002  . BREAST EXCISIONAL BIOPSY Right 06/2016   lumpectomy with rad   . BREAST EXCISIONAL BIOPSY Right 2009   breast ca +  . BREAST LUMPECTOMY WITH NEEDLE LOCALIZATION Right 07/18/2016   Procedure: BREAST LUMPECTOMY WITH NEEDLE LOCALIZATION AND RIGHT AXILLARY EXPLORATION;  Surgeon: Robert Bellow, MD;  Location: [facility] ORS;  Service: General;  Laterality: Right;  . BREAST SURGERY Right July 2009   Wide excision, sentinel left node biopsy MammoSite. ( 06/21/2008  . CHOLECYSTECTOMY  1984  . DILATION AND CURETTAGE OF UTERUS  2004  . ENDOMETRIAL ABLATION  2005  . IR FLUORO GUIDE PORT INSERTION RIGHT  01/15/2018  . TUMOR REMOVAL  12/2013   from uterus done by [facility]    Social History   Socioeconomic History  . Marital status: Married    Spouse name: [name]  . Number of children: 2  . Years of education: 23  . Highest education level: Not on file  Social Needs  . Financial resource strain: Not on file  . Food insecurity - worry: Not on file  . Food insecurity - inability: Not on file  . Transportation needs - medical: Not on file  . Transportation needs - non-medical: Not on file  Occupational History  . Occupation: unemployed  Tobacco Use  . Smoking status: Never Smoker  . Smokeless tobacco: Never Used  Substance and Sexual Activity  . Alcohol use: No  . Drug use: No  . Sexual activity: Yes  Other Topics Concern  . Not on file  Social History Narrative  . Not on file    Family History  Problem Relation Age of Onset  . CAD Mother   . Osteoporosis Mother   . Lung cancer Father   .  Diabetes Father   . Hypertension Brother   . Ovarian cancer Maternal Grandmother   . Heart attack Paternal Grandmother   . Diabetes Paternal Grandfather   . Fibromyalgia Brother   . Hypertension Brother   . Hypertension Brother   . Sleep apnea Brother   . Healthy Brother   . Sleep apnea Brother   . Ovarian cancer Other   . Breast cancer Neg Hx      Current Outpatient Medications:  .  allopurinol (ZYLOPRIM) 300 MG tablet, Take 300 mg by mouth daily., Disp: , Rfl:  .  anastrozole (ARIMIDEX) 1 MG tablet, Take 1 tablet (1 mg total) by mouth daily., Disp: 90 tablet, Rfl: 3 .  CALCIUM-MAGNESIUM-VITAMIN D ER PO, Take 1 tablet by mouth daily., Disp: , Rfl:  .  cetirizine (ZYRTEC) 10 MG tablet, Take 10 mg daily by mouth., Disp: , Rfl:  .  fluticasone (FLONASE) 50 MCG/ACT nasal spray, Place 2 sprays daily into both nostrils., Disp: 16 g, Rfl: 6 .  lidocaine-prilocaine (EMLA) cream, Apply to affected area once, Disp: 30 g, Rfl: 3 .  LORazepam (ATIVAN) 0.5 MG tablet, Take 1 tablet (0.5 mg total) by mouth every 6 (six) hours as needed (nausea and vomiting)., Disp: 30 tablet, Rfl: 0 .  Multiple Vitamin (MULTIVITAMIN) capsule, Take 1 capsule by mouth daily., Disp: , Rfl:  .  Omega-3 Krill Oil 500 MG CAPS, Take 1,000 mg by mouth daily. , Disp: , Rfl:  .  ondansetron (ZOFRAN) 8 MG tablet, Take 1 tablet (8 mg total) by mouth 2 (two) times daily as needed for nausea or vomiting. Start on day 3 after your treatment of cyclophosphamide chemotherapy, Disp: 20 tablet, Rfl: 1 .  predniSONE (DELTASONE) 50 MG tablet, Take 2 tablets (100 mg total) by mouth daily with breakfast. Take on day 1-5 of chemotherapy  With each treatment cycle, Disp: 40 tablet, Rfl: 0 .  prochlorperazine (COMPAZINE) 10 MG tablet, Take 1 tablet (10 mg total) by mouth every 6 (six) hours as needed for nausea or vomiting., Disp: 30 tablet, Rfl: 3  Physical exam:  Vitals:   01/16/18 0851  BP: 117/75  Pulse: 78  Resp: 18  Temp: 97.9 F  (36.6 C)  TempSrc: Tympanic  Weight: 179 lb 8 oz (81.4 kg)   Physical Exam  Constitutional: She is oriented to person, place, and time and well-developed, well-nourished, and in no distress.  HENT:  Head: Normocephalic and atraumatic.  Palpable right cervical adenopathy.  Appears smaller as compared to last week  Eyes: EOM are normal. Pupils are equal, round, and reactive to light.  Neck: Normal range of motion.  Cardiovascular: Normal rate, regular rhythm and normal heart sounds.  Pulmonary/Chest: Effort normal and breath sounds normal.  Abdominal: Soft. Bowel sounds are normal.  Neurological: She is alert and oriented to person, place, and time.  Skin: Skin is warm and dry.     CMP Latest Ref Rng & Units 01/16/2018  Glucose 65 - 99 mg/dL 118(H)  BUN 6 - 20 mg/dL 13  Creatinine 0.44 - 1.00 mg/dL 0.60  Sodium 135 - 145 mmol/L 138  Potassium 3.5 - 5.1 mmol/L 3.5  Chloride 101 - 111 mmol/L 100(L)  CO2 22 - 32 mmol/L 29  Calcium 8.9 - 10.3 mg/dL 9.5  Total Protein 6.5 - 8.1 g/dL 6.8  Total Bilirubin 0.3 - 1.2 mg/dL 0.3  Alkaline Phos 38 - 126 U/L 85  AST 15 - 41 U/L 21  ALT 14 - 54 U/L 18   CBC Latest Ref Rng & Units 01/16/2018  WBC 3.6 - 11.0 K/uL 9.8  Hemoglobin 12.0 - 16.0 g/dL 13.8  Hematocrit 35.0 - 47.0 % 40.5  Platelets 150 - 440 K/uL 266    No images are attached to the encounter.  Nm Cardiac Muga Rest  Result Date: 01/14/2018 CLINICAL DATA:  Diffuse large B-cell lymphoma, pre cardiotoxic chemotherapy EXAM: NUCLEAR MEDICINE CARDIAC BLOOD POOL IMAGING (MUGA) TECHNIQUE: Cardiac multi-gated acquisition was performed at rest following intravenous injection of Tc-22mlabeled red blood cells. RADIOPHARMACEUTICALS:  20.551 mCi Tc-946mertechnetate in-vitro labeled autologous red blood cells IV COMPARISON:  None FINDINGS: LEFT ventricular ejection fraction is calculated at 53%. Study was obtained at a cardiac rate of 67 beats per minute. Patient was rhythmic during imaging. No  focal LEFT ventricular wall motion abnormalities identified. IMPRESSION: Calculated LEFT ventricular ejection fraction of 53%. Normal LEFT ventricular wall motion. Electronically Signed   By: MaLavonia Dana.D.   On: 01/14/2018 15:48   Nm Pet Image Initial (pi) Skull Base To Thigh  Result Date: 01/12/2018 CLINICAL DATA:  Initial treatment  strategy for diffuse large B-cell lymphoma. EXAM: NUCLEAR MEDICINE PET SKULL BASE TO THIGH TECHNIQUE: 12.2 mCi F-18 FDG was injected intravenously. Full-ring PET imaging was performed from the skull base to thigh after the radiotracer. CT data was obtained and used for attenuation correction and anatomic localization. FASTING BLOOD GLUCOSE:  Value: 96 mg/dl COMPARISON:  Neck CT 12/15/2017. FINDINGS: NECK: The asymmetric fullness seen previously in the right tonsillar fossa is also asymmetrically hypermetabolic with SUV max = 62.8. Uptake in the contralateral tonsillar fossa has SUV max = 6.5. The level II/III lymphadenopathy identified in the right neck on the previous CT demonstrates hypermetabolism. Discrete level II lymph node seen on image 30 series 3 measures 12 mm and demonstrates SUV max = 11.0. Dominant right-sided level II node seen on image 38 shows central photopenia compatible with necrosis. Discrete 10 mm posterior right level III lymph node seen on image 39 demonstrates SUV max = 12.7. No evidence for hypermetabolic lymphadenopathy in the left neck. CHEST: No hypermetabolic mediastinal or hilar nodes. No suspicious pulmonary nodules on the CT scan. ABDOMEN/PELVIS: No abnormal hypermetabolic activity within the liver, pancreas, adrenal glands, or spleen. No hypermetabolic lymph nodes in the abdomen or pelvis. Atherosclerotic calcification noted in the abdominal aorta without aneurysm. SKELETON: No focal hypermetabolic activity to suggest skeletal metastasis. Sclerotic lesion in the L3 vertebral body shows no hypermetabolism on PET imaging. IMPRESSION: 1.  Hypermetabolism identified in the tonsillar fossa bilaterally, right greater than left. Hypermetabolism on today's study corresponds to the lesion seen on the recent diagnostic CT of the neck. 2. Hypermetabolic lymphadenopathy in the right neck (level II and III). No evidence for hypermetabolic lymphadenopathy in the contralateral neck. 3. No hypermetabolic disease in the chest, abdomen, or pelvis. No evidence for hypermetabolic disease in the skeleton. Electronically Signed   By: Misty Stanley M.D.   On: 01/12/2018 09:31   [place] Core Biopsy (lymph Nodes)  Result Date: 12/29/2017 CLINICAL DATA:  Right cervical adenopathy. History of breast carcinoma. EXAM: ULTRASOUND GUIDED CORE BIOPSY OF RIGHT CERVICAL ADENOPATHY MEDICATIONS: Intravenous Fentanyl and Versed were administered as conscious sedation during continuous monitoring of the patient's level of consciousness and physiological / cardiorespiratory status by the radiology RN, with a total moderate sedation time of 20 minutes. PROCEDURE: The procedure, risks, benefits, and alternatives were explained to the patient. Questions regarding the procedure were encouraged and answered. The patient understands and consents to the procedure. Survey ultrasound of the right cervical region was performed and the level 2/3 adenopathy was localized. An appropriate skin entry site was determined and marked. The operative field was prepped with chlorhexidine in a sterile fashion, and a sterile drape was applied covering the operative field. A sterile gown and sterile gloves were used for the procedure. Local anesthesia was provided with 1% Lidocaine. Under real-time ultrasound guidance, a 17 gauge trocar needle was advanced to the margin of the lesion. Once needle tip position was confirmed, coaxial 18-gauge core biopsy samples were obtained, submitted in saline to surgical pathology. The guide needle was removed. Postprocedure scans show no hemorrhage or other apparent  complication. The patient tolerated the procedure well. COMPLICATIONS: None. FINDINGS: Hypoechoic right cervical adenopathy was again localized corresponding to CT findings. Representative core biopsies obtained as above. IMPRESSION: 1. Technically successful ultrasound-guided core biopsy, right cervical adenopathy. Electronically Signed   By: Lucrezia Europe M.D.   On: 12/29/2017 14:21   Ir Fluoro Guide Port Insertion Right  Result Date: 01/15/2018 INDICATION: History of lymphoma. In need of durable  intravenous access for chemotherapy administration. EXAM: IMPLANTED PORT A CATH PLACEMENT WITH ULTRASOUND AND FLUOROSCOPIC GUIDANCE COMPARISON:  PET-CT - 01/12/2018 MEDICATIONS: Ancef 2 gm IV; The antibiotic was administered within an appropriate time interval prior to skin puncture. ANESTHESIA/SEDATION: Moderate (conscious) sedation was employed during this procedure. A total of Versed 2.5 mg and Fentanyl 75 mcg was administered intravenously. Moderate Sedation Time: 27 minutes. The patient's level of consciousness and vital signs were monitored continuously by radiology nursing throughout the procedure under my direct supervision. CONTRAST:  None FLUOROSCOPY TIME:  18 seconds (7 mGy) COMPLICATIONS: None immediate. PROCEDURE: The procedure, risks, benefits, and alternatives were explained to the patient. Questions regarding the procedure were encouraged and answered. The patient understands and consents to the procedure. The right neck and chest were prepped with chlorhexidine in a sterile fashion, and a sterile drape was applied covering the operative field. Maximum barrier sterile technique with sterile gowns and gloves were used for the procedure. A timeout was performed prior to the initiation of the procedure. Local anesthesia was provided with 1% lidocaine with epinephrine. After creating a small venotomy incision, a micropuncture kit was utilized to access the internal jugular vein. Real-time ultrasound guidance  was utilized for vascular access including the acquisition of a permanent ultrasound image documenting patency of the accessed vessel. The microwire was utilized to measure appropriate catheter length. A subcutaneous port pocket was then created along the upper chest wall utilizing a combination of sharp and blunt dissection. The pocket was irrigated with sterile saline. A single lumen ISP power injectable port was chosen for placement. The 8 Fr catheter was tunneled from the port pocket site to the venotomy incision. The port was placed in the pocket. The external catheter was trimmed to appropriate length. At the venotomy, an 8 Fr peel-away sheath was placed over a guidewire under fluoroscopic guidance. The catheter was then placed through the sheath and the sheath was removed. Final catheter positioning was confirmed and documented with a fluoroscopic spot radiograph. The port was accessed with a Huber needle, aspirated and flushed with heparinized saline. The venotomy site was closed with an interrupted 4-0 Vicryl suture. The port pocket incision was closed with interrupted 3-0 Vicryl suture and the skin was opposed with a running subcuticular 4-0 Vicryl suture. Dermabond and Steri-strips were applied to both incisions. Dressings were placed. The patient tolerated the procedure well without immediate post procedural complication. FINDINGS: After catheter placement, the tip lies within the superior cavoatrial junction. The catheter aspirates and flushes normally and is ready for immediate use. IMPRESSION: Successful placement of a right internal jugular approach power injectable Port-A-Cath. The catheter is ready for immediate use. Electronically Signed   By: Sandi Mariscal M.D.   On: 01/15/2018 16:18     Assessment and plan- Patient is a 62 y.o. female with Stage II E DLBCL abc subtype. Bone marrow biopsy pending   I have personally reviewed PET/CT scan images independently and I have discussed the findings  with the patient  counts okay to proceed with cycle #1 of R CHOP chemotherapy today.  I have reviewed her case at the tumor board yesterday.  Plan at this time is to proceed with 3 cycles of R CHOP chemotherapy followed by interim PET/CT scan after that.  If she has CR on her PET IFR T is potentially an option, versus 3 more cycles of R CHOP chemotherapy.  I will make a referral to radiation oncology at this time to discuss risks and benefits of  IFR T upon completion of 3 cycles of R CHOP if she achieved a CR  Patient not found to have a double hit lymphoma and therefore does not need dose adjusted [place] chemotherapy.  If bone marrow biopsy does not show any evidence of lymphoma (which is what is highly likely given that there was no PET uptake seen on the bone marrow ), her IPI score is 0 which translates to a low risk with a 5-year overall survival of 73% and 87% chance of achieving CR with first-line chemotherapy.  She does not meet criteria for CNS prophylaxis either  I will see her back in 2 weeks time with CBC and CMP to see how she has tolerated her cycle #1 of R CHOP chemotherapy.  She will continue to take her allopurinol.  Since patient is already taken prednisone for this cycle she will not take any further prednisone for this cycle but I have asked her to take her extra 50 mg of prednisone today as she is supposed to be taking 100 mg of prednisone every day for 1st 5 days along with her R CHOP chemotherapy     Visit Diagnosis 1. Diffuse large B-cell lymphoma of lymph nodes of neck (HCC)   2. Encounter for antineoplastic chemotherapy   3. Encounter for monitoring rituximab therapy      Dr. Randa Evens, MD, MPH [facility] Pager- [phone] 01/16/2018 10:45 AM

## 2018-01-19 ENCOUNTER — Other Ambulatory Visit (HOSPITAL_COMMUNITY)
Admission: RE | Admit: 2018-01-19 | Disposition: A | Discharge status: Home / Self Care | Payer: 59 | Source: Ambulatory Visit | Attending: Oncology | Admitting: Oncology

## 2018-01-19 ENCOUNTER — Ambulatory Visit
Admission: RE | Admit: 2018-01-19 | Discharge: 2018-01-19 | Disposition: A | Discharge status: Home / Self Care | Payer: Commercial Managed Care - HMO | Source: Ambulatory Visit | Attending: Oncology | Admitting: Oncology

## 2018-01-19 ENCOUNTER — Telehealth: Payer: Self-pay | Admitting: *Deleted

## 2018-01-19 DIAGNOSIS — C8330 Diffuse large B-cell lymphoma, unspecified site: Secondary | ICD-10-CM

## 2018-01-19 DIAGNOSIS — D6101 Constitutional (pure) red blood cell aplasia: Secondary | ICD-10-CM | POA: Diagnosis not present

## 2018-01-19 DIAGNOSIS — C8590 Non-Hodgkin lymphoma, unspecified, unspecified site: Secondary | ICD-10-CM | POA: Diagnosis not present

## 2018-01-19 DIAGNOSIS — D72829 Elevated white blood cell count, unspecified: Secondary | ICD-10-CM | POA: Diagnosis not present

## 2018-01-19 LAB — CBC WITH DIFFERENTIAL/PLATELET
Basophils Absolute: 0.1 10*3/uL (ref 0–0.1)
Basophils Relative: 0 %
EOS PCT: 0 %
Eosinophils Absolute: 0 10*3/uL (ref 0–0.7)
HEMATOCRIT: 41.8 % (ref 35.0–47.0)
Hemoglobin: 13.7 g/dL (ref 12.0–16.0)
LYMPHS ABS: 1.5 10*3/uL (ref 1.0–3.6)
Lymphocytes Relative: 4 %
MCH: 28.1 pg (ref 26.0–34.0)
MCHC: 32.8 g/dL (ref 32.0–36.0)
MCV: 85.7 fL (ref 80.0–100.0)
Monocytes Absolute: 0.4 10*3/uL (ref 0.2–0.9)
Monocytes Relative: 1 %
NEUTROS ABS: 36.9 10*3/uL — AB (ref 1.4–6.5)
Neutrophils Relative %: 95 %
PLATELETS: 203 10*3/uL (ref 150–440)
RBC: 4.88 MIL/uL (ref 3.80–5.20)
RDW: 13.3 % (ref 11.5–14.5)
WBC: 38.8 10*3/uL — AB (ref 3.6–11.0)

## 2018-01-19 MED ORDER — MIDAZOLAM HCL 2 MG/2ML IJ SOLN
INTRAMUSCULAR | Status: AC
  Filled 2018-01-19: qty 2

## 2018-01-19 MED ORDER — HEPARIN SOD (PORK) LOCK FLUSH 100 UNIT/ML IV SOLN
INTRAVENOUS | Status: AC
  Filled 2018-01-19: qty 5

## 2018-01-19 MED ORDER — FENTANYL CITRATE (PF) 100 MCG/2ML IJ SOLN
INTRAMUSCULAR | Status: AC | PRN
  Administered 2018-01-19 (×2): 50 ug via INTRAVENOUS

## 2018-01-19 MED ORDER — LIDOCAINE HCL 1 % IJ SOLN
INTRAMUSCULAR | Status: AC | PRN
  Administered 2018-01-19: 6 mL via INTRADERMAL

## 2018-01-19 MED ORDER — SODIUM CHLORIDE 0.9 % IV SOLN
INTRAVENOUS | Status: DC
  Administered 2018-01-19: 08:00:00 via INTRAVENOUS

## 2018-01-19 MED ORDER — FENTANYL CITRATE (PF) 100 MCG/2ML IJ SOLN
INTRAMUSCULAR | Status: AC
  Filled 2018-01-19: qty 2

## 2018-01-19 MED ORDER — MIDAZOLAM HCL 2 MG/2ML IJ SOLN
INTRAMUSCULAR | Status: AC | PRN
  Administered 2018-01-19 (×2): 1 mg via INTRAVENOUS

## 2018-01-19 NOTE — H&P (Signed)
Chief Complaint: Diffuse Large B-cell Lymphoma  Referring Physician(s): Rao,Archana C  Supervising Physician: Marybelle Killings  Patient Status: [facility] - Out-pt  History of Present Illness: Victoria Benson is a 62 y.o. female who is known to our service.  She has a recent diagnosis of Diffuse Large B-cell Lymphoma.  She has a history of breast cancer.  She had a Port A Cath placement by Dr. Pascal Lux on 01/15/2017.  She is back today for a Bone Marrow biopsy.  She is NPO. No blood thinners.   Past Medical History:  Diagnosis Date  . BRCA gene mutation negative 06/2016  . Breast cancer ([place]) 2009   T1c (1.2 cm) N0 ER 90%, PR 90%, HER-2/neu not amplified. Oncotype DX score, low risk (14) 9% chance of recurrence with antiestrogen therapy alone.  . Breast cancer ([place]) 06-17-16   INVASIVE MAMMARY CARCINOMA . T1, ER positive, PR positive, HER-2/neu not overexpressed  . Family history of adverse reaction to anesthesia    PTS MOM-HARD TO WAKE UP  . Heart murmur   . Radiation 2009   BREAST CA  . Skin cancer     Past Surgical History:  Procedure Laterality Date  . BREAST BIOPSY Right 06/17/2016   invasive. T1, ER positive, PR positive, HER-2/neu not overexpressed  . BREAST CYST ASPIRATION Left 2002  . BREAST EXCISIONAL BIOPSY Right 06/2016   lumpectomy with rad   . BREAST EXCISIONAL BIOPSY Right 2009   breast ca +  . BREAST LUMPECTOMY WITH NEEDLE LOCALIZATION Right 07/18/2016   Procedure: BREAST LUMPECTOMY WITH NEEDLE LOCALIZATION AND RIGHT AXILLARY EXPLORATION;  Surgeon: Robert Bellow, MD;  Location: [facility] ORS;  Service: General;  Laterality: Right;  . BREAST SURGERY Right July 2009   Wide excision, sentinel left node biopsy MammoSite. ( 06/21/2008  . CHOLECYSTECTOMY  1984  . DILATION AND CURETTAGE OF UTERUS  2004  . ENDOMETRIAL ABLATION  2005  . IR FLUORO GUIDE PORT INSERTION RIGHT  01/15/2018  . TUMOR REMOVAL  12/2013   from uterus done by [facility]     Allergies: Patient has no known allergies.  Medications: Prior to Admission medications   Medication Sig Start Date End Date Taking? Authorizing Provider  allopurinol (ZYLOPRIM) 300 MG tablet Take 300 mg by mouth daily.   Yes [provider]  anastrozole (ARIMIDEX) 1 MG tablet Take 1 tablet (1 mg total) by mouth daily. 01/20/17  Yes Byrnett, Forest Gleason, MD  CALCIUM-MAGNESIUM-VITAMIN D ER PO Take 1 tablet by mouth daily.   Yes [provider]  cetirizine (ZYRTEC) 10 MG tablet Take 10 mg daily by mouth.   Yes [provider]  fluticasone (FLONASE) 50 MCG/ACT nasal spray Place 2 sprays daily into both nostrils. 11/17/17  Yes Virginia Crews, MD  lidocaine-prilocaine (EMLA) cream Apply to affected area once 01/14/18  Yes Sindy Guadeloupe, MD  LORazepam (ATIVAN) 0.5 MG tablet Take 1 tablet (0.5 mg total) by mouth every 6 (six) hours as needed (nausea and vomiting). 01/15/18  Yes Sindy Guadeloupe, MD  Multiple Vitamin (MULTIVITAMIN) capsule Take 1 capsule by mouth daily.   Yes [provider]  Omega-3 Krill Oil 500 MG CAPS Take 1,000 mg by mouth daily.    Yes [provider]  predniSONE (DELTASONE) 50 MG tablet Take 2 tablets (100 mg total) by mouth daily with breakfast. Take on day 1-5 of chemotherapy  With each treatment cycle 01/15/18  Yes Sindy Guadeloupe, MD  prochlorperazine (COMPAZINE) 10 MG tablet Take  1 tablet (10 mg total) by mouth every 6 (six) hours as needed for nausea or vomiting. 01/15/18  Yes Sindy Guadeloupe, MD  ondansetron (ZOFRAN) 8 MG tablet Take 1 tablet (8 mg total) by mouth 2 (two) times daily as needed for nausea or vomiting. Start on day 3 after your treatment of cyclophosphamide chemotherapy Patient not taking: Reported on 01/16/2018 01/15/18   Sindy Guadeloupe, MD     Family History  Problem Relation Age of Onset  . CAD Mother   . Osteoporosis Mother   . Lung cancer Father   . Diabetes Father   . Hypertension Brother   . Ovarian  cancer Maternal Grandmother   . Heart attack Paternal Grandmother   . Diabetes Paternal Grandfather   . Fibromyalgia Brother   . Hypertension Brother   . Hypertension Brother   . Sleep apnea Brother   . Healthy Brother   . Sleep apnea Brother   . Ovarian cancer Other   . Breast cancer Neg Hx     Social History   Socioeconomic History  . Marital status: Married    Spouse name: [name]  . Number of children: 2  . Years of education: 70  . Highest education level: Not on file  Social Needs  . Financial resource strain: Not on file  . Food insecurity - worry: Not on file  . Food insecurity - inability: Not on file  . Transportation needs - medical: Not on file  . Transportation needs - non-medical: Not on file  Occupational History  . Occupation: unemployed  Tobacco Use  . Smoking status: Never Smoker  . Smokeless tobacco: Never Used  Substance and Sexual Activity  . Alcohol use: No  . Drug use: No  . Sexual activity: Yes  Other Topics Concern  . Not on file  Social History Narrative  . Not on file    Review of Systems: A 12 point ROS discussed  Review of Systems  Constitutional: Negative.   HENT: Negative.   Respiratory: Negative.   Cardiovascular: Negative.   Gastrointestinal: Negative.   Genitourinary: Negative.   Musculoskeletal: Negative.   Skin: Negative.   Neurological: Negative.   Hematological: Negative.   Psychiatric/Behavioral: Negative.     Vital Signs: BP 114/75   Pulse 82   Temp 98.4 F (36.9 C) (Oral)   SpO2 97%   Physical Exam  Constitutional: She is oriented to person, place, and time. She appears well-developed.  HENT:  Head: Normocephalic and atraumatic.  Eyes: EOM are normal.  Neck: Normal range of motion.  Cardiovascular: Normal rate, regular rhythm and normal heart sounds.  No murmur heard. Pulmonary/Chest: Effort normal and breath sounds normal. No stridor. No respiratory distress.  Abdominal: Soft.  Musculoskeletal: Normal  range of motion.  Neurological: She is alert and oriented to person, place, and time.  Skin: Skin is warm and dry.  Psychiatric: She has a normal mood and affect. Her behavior is normal. Judgment and thought content normal.  Vitals reviewed.   Imaging: Nm Cardiac Muga Rest  Result Date: 01/14/2018 CLINICAL DATA:  Diffuse large B-cell lymphoma, pre cardiotoxic chemotherapy EXAM: NUCLEAR MEDICINE CARDIAC BLOOD POOL IMAGING (MUGA) TECHNIQUE: Cardiac multi-gated acquisition was performed at rest following intravenous injection of Tc-29mlabeled red blood cells. RADIOPHARMACEUTICALS:  20.551 mCi Tc-960mertechnetate in-vitro labeled autologous red blood cells IV COMPARISON:  None FINDINGS: LEFT ventricular ejection fraction is calculated at 53%. Study was obtained at a cardiac rate of 67 beats per minute. Patient was  rhythmic during imaging. No focal LEFT ventricular wall motion abnormalities identified. IMPRESSION: Calculated LEFT ventricular ejection fraction of 53%. Normal LEFT ventricular wall motion. Electronically Signed   By: Lavonia Dana M.D.   On: 01/14/2018 15:48   Nm Pet Image Initial (pi) Skull Base To Thigh  Result Date: 01/12/2018 CLINICAL DATA:  Initial treatment strategy for diffuse large B-cell lymphoma. EXAM: NUCLEAR MEDICINE PET SKULL BASE TO THIGH TECHNIQUE: 12.2 mCi F-18 FDG was injected intravenously. Full-ring PET imaging was performed from the skull base to thigh after the radiotracer. CT data was obtained and used for attenuation correction and anatomic localization. FASTING BLOOD GLUCOSE:  Value: 96 mg/dl COMPARISON:  Neck CT 12/15/2017. FINDINGS: NECK: The asymmetric fullness seen previously in the right tonsillar fossa is also asymmetrically hypermetabolic with SUV max = 43.3. Uptake in the contralateral tonsillar fossa has SUV max = 6.5. The level II/III lymphadenopathy identified in the right neck on the previous CT demonstrates hypermetabolism. Discrete level II lymph node seen  on image 30 series 3 measures 12 mm and demonstrates SUV max = 11.0. Dominant right-sided level II node seen on image 38 shows central photopenia compatible with necrosis. Discrete 10 mm posterior right level III lymph node seen on image 39 demonstrates SUV max = 12.7. No evidence for hypermetabolic lymphadenopathy in the left neck. CHEST: No hypermetabolic mediastinal or hilar nodes. No suspicious pulmonary nodules on the CT scan. ABDOMEN/PELVIS: No abnormal hypermetabolic activity within the liver, pancreas, adrenal glands, or spleen. No hypermetabolic lymph nodes in the abdomen or pelvis. Atherosclerotic calcification noted in the abdominal aorta without aneurysm. SKELETON: No focal hypermetabolic activity to suggest skeletal metastasis. Sclerotic lesion in the L3 vertebral body shows no hypermetabolism on PET imaging. IMPRESSION: 1. Hypermetabolism identified in the tonsillar fossa bilaterally, right greater than left. Hypermetabolism on today's study corresponds to the lesion seen on the recent diagnostic CT of the neck. 2. Hypermetabolic lymphadenopathy in the right neck (level II and III). No evidence for hypermetabolic lymphadenopathy in the contralateral neck. 3. No hypermetabolic disease in the chest, abdomen, or pelvis. No evidence for hypermetabolic disease in the skeleton. Electronically Signed   By: Misty Stanley M.D.   On: 01/12/2018 09:31   [place] Core Biopsy (lymph Nodes)  Result Date: 12/29/2017 CLINICAL DATA:  Right cervical adenopathy. History of breast carcinoma. EXAM: ULTRASOUND GUIDED CORE BIOPSY OF RIGHT CERVICAL ADENOPATHY MEDICATIONS: Intravenous Fentanyl and Versed were administered as conscious sedation during continuous monitoring of the patient's level of consciousness and physiological / cardiorespiratory status by the radiology RN, with a total moderate sedation time of 20 minutes. PROCEDURE: The procedure, risks, benefits, and alternatives were explained to the patient. Questions  regarding the procedure were encouraged and answered. The patient understands and consents to the procedure. Survey ultrasound of the right cervical region was performed and the level 2/3 adenopathy was localized. An appropriate skin entry site was determined and marked. The operative field was prepped with chlorhexidine in a sterile fashion, and a sterile drape was applied covering the operative field. A sterile gown and sterile gloves were used for the procedure. Local anesthesia was provided with 1% Lidocaine. Under real-time ultrasound guidance, a 17 gauge trocar needle was advanced to the margin of the lesion. Once needle tip position was confirmed, coaxial 18-gauge core biopsy samples were obtained, submitted in saline to surgical pathology. The guide needle was removed. Postprocedure scans show no hemorrhage or other apparent complication. The patient tolerated the procedure well. COMPLICATIONS: None. FINDINGS: Hypoechoic right cervical  adenopathy was again localized corresponding to CT findings. Representative core biopsies obtained as above. IMPRESSION: 1. Technically successful ultrasound-guided core biopsy, right cervical adenopathy. Electronically Signed   By: Lucrezia Europe M.D.   On: 12/29/2017 14:21   Ir Fluoro Guide Port Insertion Right  Result Date: 01/15/2018 INDICATION: History of lymphoma. In need of durable intravenous access for chemotherapy administration. EXAM: IMPLANTED PORT A CATH PLACEMENT WITH ULTRASOUND AND FLUOROSCOPIC GUIDANCE COMPARISON:  PET-CT - 01/12/2018 MEDICATIONS: Ancef 2 gm IV; The antibiotic was administered within an appropriate time interval prior to skin puncture. ANESTHESIA/SEDATION: Moderate (conscious) sedation was employed during this procedure. A total of Versed 2.5 mg and Fentanyl 75 mcg was administered intravenously. Moderate Sedation Time: 27 minutes. The patient's level of consciousness and vital signs were monitored continuously by radiology nursing throughout  the procedure under my direct supervision. CONTRAST:  None FLUOROSCOPY TIME:  18 seconds (7 mGy) COMPLICATIONS: None immediate. PROCEDURE: The procedure, risks, benefits, and alternatives were explained to the patient. Questions regarding the procedure were encouraged and answered. The patient understands and consents to the procedure. The right neck and chest were prepped with chlorhexidine in a sterile fashion, and a sterile drape was applied covering the operative field. Maximum barrier sterile technique with sterile gowns and gloves were used for the procedure. A timeout was performed prior to the initiation of the procedure. Local anesthesia was provided with 1% lidocaine with epinephrine. After creating a small venotomy incision, a micropuncture kit was utilized to access the internal jugular vein. Real-time ultrasound guidance was utilized for vascular access including the acquisition of a permanent ultrasound image documenting patency of the accessed vessel. The microwire was utilized to measure appropriate catheter length. A subcutaneous port pocket was then created along the upper chest wall utilizing a combination of sharp and blunt dissection. The pocket was irrigated with sterile saline. A single lumen ISP power injectable port was chosen for placement. The 8 Fr catheter was tunneled from the port pocket site to the venotomy incision. The port was placed in the pocket. The external catheter was trimmed to appropriate length. At the venotomy, an 8 Fr peel-away sheath was placed over a guidewire under fluoroscopic guidance. The catheter was then placed through the sheath and the sheath was removed. Final catheter positioning was confirmed and documented with a fluoroscopic spot radiograph. The port was accessed with a Huber needle, aspirated and flushed with heparinized saline. The venotomy site was closed with an interrupted 4-0 Vicryl suture. The port pocket incision was closed with interrupted 3-0  Vicryl suture and the skin was opposed with a running subcuticular 4-0 Vicryl suture. Dermabond and Steri-strips were applied to both incisions. Dressings were placed. The patient tolerated the procedure well without immediate post procedural complication. FINDINGS: After catheter placement, the tip lies within the superior cavoatrial junction. The catheter aspirates and flushes normally and is ready for immediate use. IMPRESSION: Successful placement of a right internal jugular approach power injectable Port-A-Cath. The catheter is ready for immediate use. Electronically Signed   By: Sandi Mariscal M.D.   On: 01/15/2018 16:18    Labs:  CBC: Recent Labs    01/08/18 1040 01/15/18 1252 01/16/18 0839 01/19/18 0743  WBC 12.1* 10.1 9.8 38.8*  HGB 13.9 14.4 13.8 13.7  HCT 41.5 43.1 40.5 41.8  PLT 247 277 266 203    COAGS: Recent Labs    12/29/17 1239 01/15/18 1252  INR 1.01 1.02  APTT 30 27    BMP: Recent Labs  03/25/17 0848 12/15/17 0853 01/08/18 1040 01/16/18 0839  NA 141  --  141 138  K 4.6  --  3.5 3.5  CL 99  --  102 100*  CO2 28  --  29 29  GLUCOSE 84  --  95 118*  BUN 9  --  14 13  CALCIUM 9.4  --  9.9 9.5  CREATININE 0.64 0.60 0.57 0.60  GFRNONAA 98  --  >60 >60  GFRAA 113  --  >60 >60    LIVER FUNCTION TESTS: Recent Labs    03/25/17 0848 01/08/18 1040 01/16/18 0839  BILITOT 0.7 0.6 0.3  AST _0 ALT _1 ALKPHOS 92 84 85  PROT 6.8 7.8 6.8  ALBUMIN 4.5 4.1 3.7    TUMOR MARKERS: No results for input(s): AFPTM, CEA, CA199, CHROMGRNA in the last 8760 hours.  Assessment and Plan:  Diffuse Large B-cell lymphoma  Will proceed with bone marrow biopsy today by Dr. Barbie Banner.  Risks and benefits discussed with the patient including, but not limited to bleeding, infection, damage to adjacent structures or low yield requiring additional tests.  All of the patient's questions were answered, patient is agreeable to proceed. Consent signed and in  chart.  Thank you for this interesting consult.  I greatly enjoyed meeting Victoria Benson and look forward to participating in their care.  A copy of this report was sent to the requesting provider on this date.  Electronically Signed: Murrell Redden, PA-C 01/19/2018, 8:12 AM   I spent a total of    25 Minutes in face to face in clinical consultation, greater than 50% of which was counseling/coordinating care for bone marrow biopsy.

## 2018-01-19 NOTE — Telephone Encounter (Signed)
Left message with specials nurse for Ebony Hail to check chart for instructions regarding constipation

## 2018-01-19 NOTE — Telephone Encounter (Signed)
Continue miralax. Add senna 2 tab at night

## 2018-01-19 NOTE — Discharge Instructions (Signed)
Bone Marrow Aspiration and Bone Marrow Biopsy, Adult, Care After °This sheet gives you information about how to care for yourself after your procedure. Your health care provider may also give you more specific instructions. If you have problems or questions, contact your health care provider. °What can I expect after the procedure? °After the procedure, it is common to have: °· Mild pain and tenderness. °· Swelling. °· Bruising. ° °Follow these instructions at home: °· Take over-the-counter or prescription medicines only as told by your health care provider. °· Do not take baths, swim, or use a hot tub until your health care provider approves. Ask if you can take a shower or have a sponge bath. °· Follow instructions from your health care provider about how to take care of the puncture site. Make sure you: °? Wash your hands with soap and water before you change your bandage (dressing). If soap and water are not available, use hand sanitizer. °? Change your dressing as told by your health care provider. °· Check your puncture site every day for signs of infection. Check for: °? More redness, swelling, or pain. °? More fluid or blood. °? Warmth. °? Pus or a bad smell. °· Return to your normal activities as told by your health care provider. Ask your health care provider what activities are safe for you. °· Do not drive for 24 hours if you were given a medicine to help you relax (sedative). °· Keep all follow-up visits as told by your health care provider. This is important. °Contact a health care provider if: °· You have more redness, swelling, or pain around the puncture site. °· You have more fluid or blood coming from the puncture site. °· Your puncture site feels warm to the touch. °· You have pus or a bad smell coming from the puncture site. °· You have a fever. °· Your pain is not controlled with medicine. °This information is not intended to replace advice given to you by your health care provider. Make sure  you discuss any questions you have with your health care provider. °Document Released: 07/05/2005 Document Revised: 07/05/2016 Document Reviewed: 05/29/2016 °Elsevier Interactive Patient Education © 2018 [employer] ° °

## 2018-01-19 NOTE — Procedures (Signed)
BM aspirate and core R iliac EBL 0 Comp 0

## 2018-01-19 NOTE — Telephone Encounter (Signed)
Patient there for BM biopsy and husband reports patient has no bowel movement in a couple of days, he gave her Miralax and is asking if he is to continue this or what else he should do. Please advise

## 2018-01-20 ENCOUNTER — Ambulatory Visit (HOSPITAL_COMMUNITY): Payer: 59

## 2018-01-22 ENCOUNTER — Telehealth: Payer: Self-pay | Admitting: *Deleted

## 2018-01-22 NOTE — Telephone Encounter (Signed)
Patient informed of doctor response and state "will do"

## 2018-01-22 NOTE — Telephone Encounter (Signed)
Just watch for now. Call if she has sinus tenderness, worsening productive cough or fever

## 2018-01-22 NOTE — Telephone Encounter (Signed)
Patient called to report that she is having nasal congestion in mornings and that her mucous is green. NO fever, raspy voice, sore tongue on the back part of it. Asking what she should watch for and if she needs to do anything. She had chemotherapy on 1/18 and her next appointment is on 1/31. Please advise

## 2018-01-29 ENCOUNTER — Ambulatory Visit
Admission: RE | Admit: 2018-01-29 | Discharge: 2018-01-29 | Disposition: A | Discharge status: Home / Self Care | Payer: Commercial Managed Care - HMO | Source: Ambulatory Visit | Attending: Radiation Oncology | Admitting: Radiation Oncology

## 2018-01-29 ENCOUNTER — Encounter: Payer: Self-pay | Admitting: Oncology

## 2018-01-29 ENCOUNTER — Inpatient Hospital Stay: Payer: Commercial Managed Care - HMO

## 2018-01-29 ENCOUNTER — Inpatient Hospital Stay (HOSPITAL_BASED_OUTPATIENT_CLINIC_OR_DEPARTMENT_OTHER): Payer: Commercial Managed Care - HMO | Admitting: Oncology

## 2018-01-29 ENCOUNTER — Encounter: Payer: Self-pay | Admitting: Radiation Oncology

## 2018-01-29 ENCOUNTER — Other Ambulatory Visit: Payer: Self-pay

## 2018-01-29 VITALS — BP 131/80 | HR 88 | Temp 96.7°F | Resp 20 | Wt 183.0 lb

## 2018-01-29 VITALS — BP 122/82 | HR 72 | Temp 97.2°F | Resp 14 | Wt 182.0 lb

## 2018-01-29 DIAGNOSIS — R011 Cardiac murmur, unspecified: Secondary | ICD-10-CM

## 2018-01-29 DIAGNOSIS — E039 Hypothyroidism, unspecified: Secondary | ICD-10-CM | POA: Diagnosis not present

## 2018-01-29 DIAGNOSIS — C50211 Malignant neoplasm of upper-inner quadrant of right female breast: Secondary | ICD-10-CM | POA: Diagnosis not present

## 2018-01-29 DIAGNOSIS — C8331 Diffuse large B-cell lymphoma, lymph nodes of head, face, and neck: Secondary | ICD-10-CM

## 2018-01-29 DIAGNOSIS — C8330 Diffuse large B-cell lymphoma, unspecified site: Secondary | ICD-10-CM

## 2018-01-29 DIAGNOSIS — Z801 Family history of malignant neoplasm of trachea, bronchus and lung: Secondary | ICD-10-CM

## 2018-01-29 DIAGNOSIS — Z8041 Family history of malignant neoplasm of ovary: Secondary | ICD-10-CM | POA: Diagnosis not present

## 2018-01-29 DIAGNOSIS — Z7952 Long term (current) use of systemic steroids: Secondary | ICD-10-CM

## 2018-01-29 DIAGNOSIS — Z79899 Other long term (current) drug therapy: Secondary | ICD-10-CM | POA: Diagnosis not present

## 2018-01-29 DIAGNOSIS — Z170 Estrogen receptor positive status [ER+]: Secondary | ICD-10-CM

## 2018-01-29 DIAGNOSIS — Z79811 Long term (current) use of aromatase inhibitors: Secondary | ICD-10-CM | POA: Diagnosis not present

## 2018-01-29 DIAGNOSIS — R1310 Dysphagia, unspecified: Secondary | ICD-10-CM | POA: Diagnosis not present

## 2018-01-29 DIAGNOSIS — K589 Irritable bowel syndrome without diarrhea: Secondary | ICD-10-CM

## 2018-01-29 DIAGNOSIS — Z5112 Encounter for antineoplastic immunotherapy: Secondary | ICD-10-CM | POA: Diagnosis not present

## 2018-01-29 DIAGNOSIS — E042 Nontoxic multinodular goiter: Secondary | ICD-10-CM

## 2018-01-29 DIAGNOSIS — Z85828 Personal history of other malignant neoplasm of skin: Secondary | ICD-10-CM

## 2018-01-29 DIAGNOSIS — Z9049 Acquired absence of other specified parts of digestive tract: Secondary | ICD-10-CM | POA: Diagnosis not present

## 2018-01-29 DIAGNOSIS — Z7689 Persons encountering health services in other specified circumstances: Secondary | ICD-10-CM | POA: Diagnosis not present

## 2018-01-29 DIAGNOSIS — I872 Venous insufficiency (chronic) (peripheral): Secondary | ICD-10-CM

## 2018-01-29 DIAGNOSIS — I8393 Asymptomatic varicose veins of bilateral lower extremities: Secondary | ICD-10-CM

## 2018-01-29 DIAGNOSIS — M79606 Pain in leg, unspecified: Secondary | ICD-10-CM | POA: Diagnosis not present

## 2018-01-29 DIAGNOSIS — E559 Vitamin D deficiency, unspecified: Secondary | ICD-10-CM

## 2018-01-29 DIAGNOSIS — Z5111 Encounter for antineoplastic chemotherapy: Secondary | ICD-10-CM | POA: Diagnosis not present

## 2018-01-29 DIAGNOSIS — Z923 Personal history of irradiation: Secondary | ICD-10-CM

## 2018-01-29 DIAGNOSIS — Z853 Personal history of malignant neoplasm of breast: Secondary | ICD-10-CM | POA: Diagnosis not present

## 2018-01-29 DIAGNOSIS — E785 Hyperlipidemia, unspecified: Secondary | ICD-10-CM

## 2018-01-29 DIAGNOSIS — M25562 Pain in left knee: Secondary | ICD-10-CM

## 2018-01-29 DIAGNOSIS — E781 Pure hyperglyceridemia: Secondary | ICD-10-CM

## 2018-01-29 LAB — CBC WITH DIFFERENTIAL/PLATELET
BASOS ABS: 0.1 10*3/uL (ref 0–0.1)
BASOS PCT: 1 %
Eosinophils Absolute: 0.1 10*3/uL (ref 0–0.7)
Eosinophils Relative: 1 %
HCT: 38 % (ref 35.0–47.0)
Hemoglobin: 12.6 g/dL (ref 12.0–16.0)
LYMPHS PCT: 10 %
Lymphs Abs: 1.3 10*3/uL (ref 1.0–3.6)
MCH: 28.2 pg (ref 26.0–34.0)
MCHC: 33.1 g/dL (ref 32.0–36.0)
MCV: 85.2 fL (ref 80.0–100.0)
Monocytes Absolute: 0.6 10*3/uL (ref 0.2–0.9)
Monocytes Relative: 5 %
Neutro Abs: 11.5 10*3/uL — ABNORMAL HIGH (ref 1.4–6.5)
Neutrophils Relative %: 85 %
Platelets: 134 10*3/uL — ABNORMAL LOW (ref 150–440)
RBC: 4.45 MIL/uL (ref 3.80–5.20)
RDW: 13.3 % (ref 11.5–14.5)
WBC: 13.6 10*3/uL — AB (ref 3.6–11.0)

## 2018-01-29 LAB — COMPREHENSIVE METABOLIC PANEL
ALK PHOS: 94 U/L (ref 38–126)
ALT: 20 U/L (ref 14–54)
ANION GAP: 10 (ref 5–15)
AST: 18 U/L (ref 15–41)
Albumin: 3.6 g/dL (ref 3.5–5.0)
BILIRUBIN TOTAL: 0.4 mg/dL (ref 0.3–1.2)
BUN: 9 mg/dL (ref 6–20)
CALCIUM: 9.4 mg/dL (ref 8.9–10.3)
CO2: 29 mmol/L (ref 22–32)
Chloride: 101 mmol/L (ref 101–111)
Creatinine, Ser: 0.64 mg/dL (ref 0.44–1.00)
GFR calc Af Amer: >60 mL/min (ref >60)
Glucose, Bld: 108 mg/dL — ABNORMAL HIGH (ref 65–99)
POTASSIUM: 4.5 mmol/L (ref 3.5–5.1)
Sodium: 140 mmol/L (ref 135–145)
TOTAL PROTEIN: 6.4 g/dL — AB (ref 6.5–8.1)

## 2018-01-29 NOTE — Progress Notes (Signed)
Hematology/Oncology Consult note [facility]  [phone] [phone]  Patient Care Team: Virginia Crews, MD as PCP - General (Family Medicine) Margarita Rana, MD as Referring Physician (Family Medicine) Bary Castilla Forest Gleason, MD (General Surgery)   Name of the patient: Victoria Benson  [id number]  [birth year]   Date of visit: 01/29/18  Diagnosis- Stage II E DLBCL. ABC subtype. Not double hit.  Chief complaint/ Reason for visit- assess tolerance to cycle 1 of RCHOP  Heme/Onc history: patient is a 62 year old female who developed symptoms of upper respiratory infection and nasal congestion sometime in November 2018. Around the same time she also developed a right neck mass which was initially attributed to possible tonsillitis and she was given antibiotics for the same. However her right neck mass did not decrease in size and she eventually saw Dr. Pryor Ochoa from ENT. Patient was found to have significant right cervical adenopathy and underwent CT of the soft tissue neck which showed right level 2 and 3 adenopathy favoring metastatic carcinoma. Jugulodigastric node measuring up to 3.7 cm. Asymmetric fullness of the right tonsillar fossa possibly primary site.  Patient underwent core biopsy of the right cervical lymph node. Pathology showed diffuse large B-cell lymphoma of the ABC subtype.  IHC was positive for BCL-2 and BCL 6 see Mick IHC was not tested due to insufficiency of the testing sample.  Ki-67 was high 80-90%.  Patient reports feeling well prior to November 2018. Currently she denies any fevers, chills, unintentional weight loss or drenching night sweats. She reports some difficulty swallowing because of the right neck mass but denies any shortness of breath. She did receive 1 dose of 100 mg prednisone yesterday and reports that her mass feels smaller and swallowing is easier.  PET/CT scan showed hypermetabolic them in the  tonsillar fossa bilaterally right greater than left as well as hypermetabolic lymphadenopathy in the right neck at the level 2 and level 3.  No evidence of contralateral cervical adenopathy and no evidence of metastatic disease elsewhere  Bone marrow biopsy showed no evidence of lymphoma. IPI score 0- low risk. Does not meet criteria for CNS prophylaxis   Interval history- she tolerated cycle 1 of RCHOP well. Did not have any neulasta associated pain. Did experience some constipation 1st few days after chemo. Nausea well controlled  ECOG PS- 0 Pain scale- 0  Review of systems- Review of Systems  Constitutional: Negative for chills, fever, malaise/fatigue and weight loss.  HENT: Negative for congestion, ear discharge and nosebleeds.   Eyes: Negative for blurred vision.  Respiratory: Negative for cough, hemoptysis, sputum production, shortness of breath and wheezing.   Cardiovascular: Negative for chest pain, palpitations, orthopnea and claudication.  Gastrointestinal: Positive for constipation. Negative for abdominal pain, blood in stool, diarrhea, heartburn, melena, nausea and vomiting.  Genitourinary: Negative for dysuria, flank pain, frequency, hematuria and urgency.  Musculoskeletal: Negative for back pain, joint pain and myalgias.  Skin: Negative for rash.  Neurological: Negative for dizziness, tingling, focal weakness, seizures, weakness and headaches.  Endo/Heme/Allergies: Does not bruise/bleed easily.  Psychiatric/Behavioral: Negative for depression and suicidal ideas. The patient does not have insomnia.       No Known Allergies   Past Medical History:  Diagnosis Date  . BRCA gene mutation negative 06/2016  . Breast cancer ([place]) 2009   T1c (1.2 cm) N0 ER 90%, PR 90%, HER-2/neu not amplified. Oncotype DX score, low risk (14) 9% chance of recurrence with antiestrogen therapy alone.  Marland Kitchen  Breast cancer ([place]) 06-17-16   INVASIVE MAMMARY CARCINOMA . T1, ER positive, PR positive,  HER-2/neu not overexpressed  . Family history of adverse reaction to anesthesia    PTS MOM-HARD TO WAKE UP  . Heart murmur   . Radiation 2009   BREAST CA  . Skin cancer      Past Surgical History:  Procedure Laterality Date  . BREAST BIOPSY Right 06/17/2016   invasive. T1, ER positive, PR positive, HER-2/neu not overexpressed  . BREAST CYST ASPIRATION Left 2002  . BREAST EXCISIONAL BIOPSY Right 06/2016   lumpectomy with rad   . BREAST EXCISIONAL BIOPSY Right 2009   breast ca +  . BREAST LUMPECTOMY WITH NEEDLE LOCALIZATION Right 07/18/2016   Procedure: BREAST LUMPECTOMY WITH NEEDLE LOCALIZATION AND RIGHT AXILLARY EXPLORATION;  Surgeon: Robert Bellow, MD;  Location: [facility] ORS;  Service: General;  Laterality: Right;  . BREAST SURGERY Right July 2009   Wide excision, sentinel left node biopsy MammoSite. ( 06/21/2008  . CHOLECYSTECTOMY  1984  . DILATION AND CURETTAGE OF UTERUS  2004  . ENDOMETRIAL ABLATION  2005  . IR FLUORO GUIDE PORT INSERTION RIGHT  01/15/2018  . TUMOR REMOVAL  12/2013   from uterus done by [facility]    Social History   Socioeconomic History  . Marital status: Married    Spouse name: [name]  . Number of children: 2  . Years of education: 74  . Highest education level: Not on file  Social Needs  . Financial resource strain: Not on file  . Food insecurity - worry: Not on file  . Food insecurity - inability: Not on file  . Transportation needs - medical: Not on file  . Transportation needs - non-medical: Not on file  Occupational History  . Occupation: unemployed  Tobacco Use  . Smoking status: Never Smoker  . Smokeless tobacco: Never Used  Substance and Sexual Activity  . Alcohol use: No  . Drug use: No  . Sexual activity: Yes  Other Topics Concern  . Not on file  Social History Narrative  . Not on file    Family History  Problem Relation Age of Onset  . CAD Mother   . Osteoporosis Mother   . Lung cancer Father   . Diabetes Father   .  Hypertension Brother   . Ovarian cancer Maternal Grandmother   . Heart attack Paternal Grandmother   . Diabetes Paternal Grandfather   . Fibromyalgia Brother   . Hypertension Brother   . Hypertension Brother   . Sleep apnea Brother   . Healthy Brother   . Sleep apnea Brother   . Ovarian cancer Other   . Breast cancer Neg Hx      Current Outpatient Medications:  .  allopurinol (ZYLOPRIM) 300 MG tablet, Take 300 mg by mouth daily., Disp: , Rfl:  .  anastrozole (ARIMIDEX) 1 MG tablet, Take 1 tablet (1 mg total) by mouth daily., Disp: 90 tablet, Rfl: 3 .  CALCIUM-MAGNESIUM-VITAMIN D ER PO, Take 1 tablet by mouth daily., Disp: , Rfl:  .  cetirizine (ZYRTEC) 10 MG tablet, Take 10 mg daily by mouth., Disp: , Rfl:  .  fluticasone (FLONASE) 50 MCG/ACT nasal spray, Place 2 sprays daily into both nostrils., Disp: 16 g, Rfl: 6 .  lidocaine-prilocaine (EMLA) cream, Apply to affected area once, Disp: 30 g, Rfl: 3 .  Multiple Vitamin (MULTIVITAMIN) capsule, Take 1 capsule by mouth daily., Disp: , Rfl:  .  Omega-3 Krill Oil 500 MG  CAPS, Take 1,000 mg by mouth daily. , Disp: , Rfl:  .  predniSONE (DELTASONE) 50 MG tablet, Take 2 tablets (100 mg total) by mouth daily with breakfast. Take on day 1-5 of chemotherapy  With each treatment cycle, Disp: 40 tablet, Rfl: 0 .  LORazepam (ATIVAN) 0.5 MG tablet, Take 1 tablet (0.5 mg total) by mouth every 6 (six) hours as needed (nausea and vomiting). (Patient not taking: Reported on 01/29/2018), Disp: 30 tablet, Rfl: 0 .  ondansetron (ZOFRAN) 8 MG tablet, Take 1 tablet (8 mg total) by mouth 2 (two) times daily as needed for nausea or vomiting. Start on day 3 after your treatment of cyclophosphamide chemotherapy (Patient not taking: Reported on 01/29/2018), Disp: 20 tablet, Rfl: 1 .  prochlorperazine (COMPAZINE) 10 MG tablet, Take 1 tablet (10 mg total) by mouth every 6 (six) hours as needed for nausea or vomiting. (Patient not taking: Reported on 01/29/2018), Disp: 30  tablet, Rfl: 3  Physical exam:  Vitals:   01/29/18 1134  BP: 122/82  Pulse: 72  Resp: 14  Temp: (!) 97.2 F (36.2 C)  Weight: 182 lb (82.6 kg)   Physical Exam  Constitutional: She is oriented to person, place, and time and well-developed, well-nourished, and in no distress.  HENT:  Head: Normocephalic and atraumatic.  Eyes: EOM are normal. Pupils are equal, round, and reactive to light.  Neck: Normal range of motion.  Cardiovascular: Normal rate, regular rhythm and normal heart sounds.  Pulmonary/Chest: Effort normal and breath sounds normal.  Abdominal: Soft. Bowel sounds are normal.  Lymphadenopathy:  Right cervical adenopathy significantly smaller  Neurological: She is alert and oriented to person, place, and time.  Skin: Skin is warm and dry.     CMP Latest Ref Rng & Units 01/29/2018  Glucose 65 - 99 mg/dL 108(H)  BUN 6 - 20 mg/dL 9  Creatinine 0.44 - 1.00 mg/dL 0.64  Sodium 135 - 145 mmol/L 140  Potassium 3.5 - 5.1 mmol/L 4.5  Chloride 101 - 111 mmol/L 101  CO2 22 - 32 mmol/L 29  Calcium 8.9 - 10.3 mg/dL 9.4  Total Protein 6.5 - 8.1 g/dL 6.4(L)  Total Bilirubin 0.3 - 1.2 mg/dL 0.4  Alkaline Phos 38 - 126 U/L 94  AST 15 - 41 U/L 18  ALT 14 - 54 U/L 20   CBC Latest Ref Rng & Units 01/29/2018  WBC 3.6 - 11.0 K/uL 13.6(H)  Hemoglobin 12.0 - 16.0 g/dL 12.6  Hematocrit 35.0 - 47.0 % 38.0  Platelets 150 - 440 K/uL 134(L)    No images are attached to the encounter.  Nm Cardiac Muga Rest  Result Date: 01/14/2018 CLINICAL DATA:  Diffuse large B-cell lymphoma, pre cardiotoxic chemotherapy EXAM: NUCLEAR MEDICINE CARDIAC BLOOD POOL IMAGING (MUGA) TECHNIQUE: Cardiac multi-gated acquisition was performed at rest following intravenous injection of Tc-76mlabeled red blood cells. RADIOPHARMACEUTICALS:  20.551 mCi Tc-921mertechnetate in-vitro labeled autologous red blood cells IV COMPARISON:  None FINDINGS: LEFT ventricular ejection fraction is calculated at 53%. Study was  obtained at a cardiac rate of 67 beats per minute. Patient was rhythmic during imaging. No focal LEFT ventricular wall motion abnormalities identified. IMPRESSION: Calculated LEFT ventricular ejection fraction of 53%. Normal LEFT ventricular wall motion. Electronically Signed   By: MaLavonia Dana.D.   On: 01/14/2018 15:48   Nm Pet Image Initial (pi) Skull Base To Thigh  Result Date: 01/12/2018 CLINICAL DATA:  Initial treatment strategy for diffuse large B-cell lymphoma. EXAM: NUCLEAR MEDICINE PET SKULL BASE  TO THIGH TECHNIQUE: 12.2 mCi F-18 FDG was injected intravenously. Full-ring PET imaging was performed from the skull base to thigh after the radiotracer. CT data was obtained and used for attenuation correction and anatomic localization. FASTING BLOOD GLUCOSE:  Value: 96 mg/dl COMPARISON:  Neck CT 12/15/2017. FINDINGS: NECK: The asymmetric fullness seen previously in the right tonsillar fossa is also asymmetrically hypermetabolic with SUV max = 30.8. Uptake in the contralateral tonsillar fossa has SUV max = 6.5. The level II/III lymphadenopathy identified in the right neck on the previous CT demonstrates hypermetabolism. Discrete level II lymph node seen on image 30 series 3 measures 12 mm and demonstrates SUV max = 11.0. Dominant right-sided level II node seen on image 38 shows central photopenia compatible with necrosis. Discrete 10 mm posterior right level III lymph node seen on image 39 demonstrates SUV max = 12.7. No evidence for hypermetabolic lymphadenopathy in the left neck. CHEST: No hypermetabolic mediastinal or hilar nodes. No suspicious pulmonary nodules on the CT scan. ABDOMEN/PELVIS: No abnormal hypermetabolic activity within the liver, pancreas, adrenal glands, or spleen. No hypermetabolic lymph nodes in the abdomen or pelvis. Atherosclerotic calcification noted in the abdominal aorta without aneurysm. SKELETON: No focal hypermetabolic activity to suggest skeletal metastasis. Sclerotic lesion in  the L3 vertebral body shows no hypermetabolism on PET imaging. IMPRESSION: 1. Hypermetabolism identified in the tonsillar fossa bilaterally, right greater than left. Hypermetabolism on today's study corresponds to the lesion seen on the recent diagnostic CT of the neck. 2. Hypermetabolic lymphadenopathy in the right neck (level II and III). No evidence for hypermetabolic lymphadenopathy in the contralateral neck. 3. No hypermetabolic disease in the chest, abdomen, or pelvis. No evidence for hypermetabolic disease in the skeleton. Electronically Signed   By: Misty Stanley M.D.   On: 01/12/2018 09:31   Ct Bone Marrow Biopsy  Result Date: 01/19/2018 INDICATION: Lymphoma EXAM: CT-GUIDED BONE MARROW BIOPSY MEDICATIONS: None. ANESTHESIA/SEDATION: Moderate (conscious) sedation was employed during this procedure. A total of Versed 2 mg and Fentanyl 100 mcg was administered intravenously. Moderate Sedation Time: 11 minutes. The patient's level of consciousness and vital signs were monitored continuously by radiology nursing throughout the procedure under my direct supervision. FLUOROSCOPY TIME:  Fluoroscopy Time:  minutes  seconds ( mGy). COMPLICATIONS: None immediate. PROCEDURE: Informed written consent was obtained from the patient after a thorough discussion of the procedural risks, benefits and alternatives. All questions were addressed. Maximal Sterile Barrier Technique was utilized including caps, mask, sterile gowns, sterile gloves, sterile drape, hand hygiene and skin antiseptic. A timeout was performed prior to the initiation of the procedure. Under CT guidance, an 11 gauge needle was advanced into the right iliac bone via posterior approach aspirates and a core were obtained. FINDINGS: Imaging documents needle placement in the right iliac bone. Post biopsy images demonstrate no evidence of hemorrhage. IMPRESSION: Successful CT-guided right iliac bone marrow aspirate and core. Electronically Signed   By: Marybelle Killings M.D.   On: 01/19/2018 10:20   Ir Fluoro Guide Port Insertion Right  Result Date: 01/15/2018 INDICATION: History of lymphoma. In need of durable intravenous access for chemotherapy administration. EXAM: IMPLANTED PORT A CATH PLACEMENT WITH ULTRASOUND AND FLUOROSCOPIC GUIDANCE COMPARISON:  PET-CT - 01/12/2018 MEDICATIONS: Ancef 2 gm IV; The antibiotic was administered within an appropriate time interval prior to skin puncture. ANESTHESIA/SEDATION: Moderate (conscious) sedation was employed during this procedure. A total of Versed 2.5 mg and Fentanyl 75 mcg was administered intravenously. Moderate Sedation Time: 27 minutes. The patient's level of  consciousness and vital signs were monitored continuously by radiology nursing throughout the procedure under my direct supervision. CONTRAST:  None FLUOROSCOPY TIME:  18 seconds (7 mGy) COMPLICATIONS: None immediate. PROCEDURE: The procedure, risks, benefits, and alternatives were explained to the patient. Questions regarding the procedure were encouraged and answered. The patient understands and consents to the procedure. The right neck and chest were prepped with chlorhexidine in a sterile fashion, and a sterile drape was applied covering the operative field. Maximum barrier sterile technique with sterile gowns and gloves were used for the procedure. A timeout was performed prior to the initiation of the procedure. Local anesthesia was provided with 1% lidocaine with epinephrine. After creating a small venotomy incision, a micropuncture kit was utilized to access the internal jugular vein. Real-time ultrasound guidance was utilized for vascular access including the acquisition of a permanent ultrasound image documenting patency of the accessed vessel. The microwire was utilized to measure appropriate catheter length. A subcutaneous port pocket was then created along the upper chest wall utilizing a combination of sharp and blunt dissection. The pocket was irrigated  with sterile saline. A single lumen ISP power injectable port was chosen for placement. The 8 Fr catheter was tunneled from the port pocket site to the venotomy incision. The port was placed in the pocket. The external catheter was trimmed to appropriate length. At the venotomy, an 8 Fr peel-away sheath was placed over a guidewire under fluoroscopic guidance. The catheter was then placed through the sheath and the sheath was removed. Final catheter positioning was confirmed and documented with a fluoroscopic spot radiograph. The port was accessed with a Huber needle, aspirated and flushed with heparinized saline. The venotomy site was closed with an interrupted 4-0 Vicryl suture. The port pocket incision was closed with interrupted 3-0 Vicryl suture and the skin was opposed with a running subcuticular 4-0 Vicryl suture. Dermabond and Steri-strips were applied to both incisions. Dressings were placed. The patient tolerated the procedure well without immediate post procedural complication. FINDINGS: After catheter placement, the tip lies within the superior cavoatrial junction. The catheter aspirates and flushes normally and is ready for immediate use. IMPRESSION: Successful placement of a right internal jugular approach power injectable Port-A-Cath. The catheter is ready for immediate use. Electronically Signed   By: Sandi Mariscal M.D.   On: 01/15/2018 16:18     Assessment and plan- Patient is a 62 y.o. female  with Stage II E DLBCL abc subtype not double hit. Low risk IPI score 0  Patient has tolerated her cycle 1 of RCHOP well. Clinically right cervical adenopathy is smaller. I will see her back in 1 week for cycle 2 of RCHOP with onpro neulasta support. Scans after cycle 3. If she attains CR, she will proceed for IMRT. She met with Dr. Donella Stade today   Visit Diagnosis 1. Diffuse large B-cell lymphoma of lymph nodes of neck (HCC)      Dr. Randa Evens, MD, MPH [facility] at [facility] Pager- [phone] 01/29/2018 12:10 PM

## 2018-01-29 NOTE — Progress Notes (Signed)
NEW PATIENT EVALUATION (O PNA) old patient new area lymphoma  Name: Victoria Benson  MRN: [id number]  Date:   01/29/2018     DOB: [birth year]   This 62 y.o. female patient presents to the clinic for initial evaluation of stage II diffuse large B-cell lymphoma for consideration of involved field radiation.  REFERRING PHYSICIAN: Virginia Crews, MD  CHIEF COMPLAINT:  Chief Complaint  Patient presents with  . Cancer    Pt is here for initial consultation of lymphoma     DIAGNOSIS: The encounter diagnosis was Diffuse large B-cell lymphoma of lymph nodes of neck ([place]).   PREVIOUS INVESTIGATIONS:  PET/CT scan reviewed Pathology report reviewed Clinical notes reviewed Case presented at weekly tumor conference  HPI: Patient is a 62 year old female well-known to our department have not previously been treated in 2017 to her right breast status post ipsilateral recurrence after accelerated partial breast radiation in 2009 for invasive mammary carcinoma.  She recently presented with an enlarged tonsil nasal congestion and progressive right neck mass.  She was empirically treated with antibiotic attic therapy although neck mass did not increase in size she saw ENT.  Initial CT scan showed right level 2 and 3 adenopathy favoring metastatic carcinoma.  She had a jugulodigastric node measuring 3.7 cm and asymmetric fullness in the right tonsillar fossa.  A core biopsy showed diffuse large B-cell lymphoma.  PET CTs demonstrate hypermetabolic activity in the tonsillar fossa bilaterally right greater than left as well as hypermetabolic adenopathy in the right neck at the level 2 and 3 nodes.  She has started R CHOP chemotherapy 1 cycle.  She was presented at tumor conference and gait plan was to initiate involved field radiation if she had a complete response by PET/CT criteria after 3 cycles of R CHOP.  She is seen today and doing well.  She specifically denies fever chills night sweats weight loss or  dysphasia.  She is seen today for radiation oncology opinion.  PLANNED TREATMENT REGIMEN: Possible involved field radiation therapy after 3 cycles of CHOP and complete response by PET/CT criteria  PAST MEDICAL HISTORY:  has a past medical history of BRCA gene mutation negative (06/2016), Breast cancer ([place]) (2009), Breast cancer ([place]) (06-17-16), Family history of adverse reaction to anesthesia, Heart murmur, Radiation (2009), and Skin cancer.    PAST SURGICAL HISTORY:  Past Surgical History:  Procedure Laterality Date  . BREAST BIOPSY Right 06/17/2016   invasive. T1, ER positive, PR positive, HER-2/neu not overexpressed  . BREAST CYST ASPIRATION Left 2002  . BREAST EXCISIONAL BIOPSY Right 06/2016   lumpectomy with rad   . BREAST EXCISIONAL BIOPSY Right 2009   breast ca +  . BREAST LUMPECTOMY WITH NEEDLE LOCALIZATION Right 07/18/2016   Procedure: BREAST LUMPECTOMY WITH NEEDLE LOCALIZATION AND RIGHT AXILLARY EXPLORATION;  Surgeon: Robert Bellow, MD;  Location: [facility] ORS;  Service: General;  Laterality: Right;  . BREAST SURGERY Right July 2009   Wide excision, sentinel left node biopsy MammoSite. ( 06/21/2008  . CHOLECYSTECTOMY  1984  . DILATION AND CURETTAGE OF UTERUS  2004  . ENDOMETRIAL ABLATION  2005  . IR FLUORO GUIDE PORT INSERTION RIGHT  01/15/2018  . TUMOR REMOVAL  12/2013   from uterus done by [facility]    FAMILY HISTORY: family history includes CAD in her mother; Diabetes in her father and paternal grandfather; Fibromyalgia in her brother; Healthy in her brother; Heart attack in her paternal grandmother; Hypertension in her brother, brother, and brother; Lung  cancer in her father; Osteoporosis in her mother; Ovarian cancer in her maternal grandmother and other; Sleep apnea in her brother and brother.  SOCIAL HISTORY:  reports that  has never smoked. she has never used smokeless tobacco. She reports that she does not drink alcohol or use drugs.  ALLERGIES: Patient has no known  allergies.  MEDICATIONS:  Current Outpatient Medications  Medication Sig Dispense Refill  . allopurinol (ZYLOPRIM) 300 MG tablet Take 300 mg by mouth daily.    Marland Kitchen anastrozole (ARIMIDEX) 1 MG tablet Take 1 tablet (1 mg total) by mouth daily. 90 tablet 3  . CALCIUM-MAGNESIUM-VITAMIN D ER PO Take 1 tablet by mouth daily.    . cetirizine (ZYRTEC) 10 MG tablet Take 10 mg daily by mouth.    . fluticasone (FLONASE) 50 MCG/ACT nasal spray Place 2 sprays daily into both nostrils. 16 g 6  . lidocaine-prilocaine (EMLA) cream Apply to affected area once 30 g 3  . LORazepam (ATIVAN) 0.5 MG tablet Take 1 tablet (0.5 mg total) by mouth every 6 (six) hours as needed (nausea and vomiting). 30 tablet 0  . Multiple Vitamin (MULTIVITAMIN) capsule Take 1 capsule by mouth daily.    . Omega-3 Krill Oil 500 MG CAPS Take 1,000 mg by mouth daily.     . ondansetron (ZOFRAN) 8 MG tablet Take 1 tablet (8 mg total) by mouth 2 (two) times daily as needed for nausea or vomiting. Start on day 3 after your treatment of cyclophosphamide chemotherapy 20 tablet 1  . predniSONE (DELTASONE) 50 MG tablet Take 2 tablets (100 mg total) by mouth daily with breakfast. Take on day 1-5 of chemotherapy  With each treatment cycle 40 tablet 0  . prochlorperazine (COMPAZINE) 10 MG tablet Take 1 tablet (10 mg total) by mouth every 6 (six) hours as needed for nausea or vomiting. 30 tablet 3   No current facility-administered medications for this encounter.     ECOG PERFORMANCE STATUS:  0 - Asymptomatic  REVIEW OF SYSTEMS: Patient denies any weight loss, fatigue, weakness, fever, chills or night sweats. Patient denies any loss of vision, blurred vision. Patient denies any ringing  of the ears or hearing loss. No irregular heartbeat. Patient denies heart murmur or history of fainting. Patient denies any chest pain or pain radiating to her upper extremities. Patient denies any shortness of breath, difficulty breathing at night, cough or  hemoptysis. Patient denies any swelling in the lower legs. Patient denies any nausea vomiting, vomiting of blood, or coffee ground material in the vomitus. Patient denies any stomach pain. Patient states has had normal bowel movements no significant constipation or diarrhea. Patient denies any dysuria, hematuria or significant nocturia. Patient denies any problems walking, swelling in the joints or loss of balance. Patient denies any skin changes, loss of hair or loss of weight. Patient denies any excessive worrying or anxiety or significant depression. Patient denies any problems with insomnia. Patient denies excessive thirst, polyuria, polydipsia. Patient denies any swollen glands, patient denies easy bruising or easy bleeding. Patient denies any recent infections, allergies or URI. Patient "s visual fields have not changed significantly in recent time.   PHYSICAL EXAM: BP 131/80   Pulse 88   Temp (!) 96.7 F (35.9 C)   Resp 20   Wt 182 lb 15.7 oz (83 kg)   BMI 32.02 kg/m  Oral cavity is clear no evidence of mass or nodularity in the tonsillar fossa's are identified.  Indirect mirror examination shows upper airway clear vallecula and  base of tongue within normal limits.  She does have prominent adenopathy in the right neck consistent with known diagnosis.  Left neck is clear no adenopathy in the supraclavicular axillary or inguinal regions are noted. Well-developed well-nourished patient in NAD. HEENT reveals PERLA, EOMI, discs not visualized.  Oral cavity is clear. No oral mucosal lesions are identified. Neck is clear without evidence of cervical or supraclavicular adenopathy. Lungs are clear to A&P. Cardiac examination is essentially unremarkable with regular rate and rhythm without murmur rub or thrill. Abdomen is benign with no organomegaly or masses noted. Motor sensory and DTR levels are equal and symmetric in the upper and lower extremities. Cranial nerves II through XII are grossly intact.  Proprioception is intact. No peripheral adenopathy or edema is identified. No motor or sensory levels are noted. Crude visual fields are within normal range.  LABORATORY DATA: Pathology report reviewed    RADIOLOGY RESULTS: PET/CT and CT scans reviewed and compatible with above-stated findings   IMPRESSION: Stage II diffuse B-cell lymphoma of the head and neck region in 62 year old female previously treated for breast cancer status post 1 cycle of R CHOP chemotherapy  PLAN: Present time I will reevaluate the patient should she achieve a complete complete response after 3 cycles.  At that time would recommend involved field radiation therapy to her head and neck region delivering 3000 cGy over 3 weeks.  Would use IM RT radiation therapy to spare critical structures such as her salivary gland spinal cord and esophagus.  Risks and benefits of treatment including possible loss of taste dryness of the mouth fatigue skin reaction alteration of blood counts all were discussed in detail with the patient and her husband.  I will leave it up to medical oncology to refer her back after her 3 cycles and PET/CT scan are completed.  Patient and husband know to call with any concerns.  I would like to take this opportunity to thank you for allowing me to participate in the care of your patient.Noreene Filbert, MD

## 2018-02-05 ENCOUNTER — Other Ambulatory Visit: Payer: Self-pay

## 2018-02-05 DIAGNOSIS — C50211 Malignant neoplasm of upper-inner quadrant of right female breast: Secondary | ICD-10-CM

## 2018-02-05 DIAGNOSIS — Z170 Estrogen receptor positive status [ER+]: Principal | ICD-10-CM

## 2018-02-06 ENCOUNTER — Inpatient Hospital Stay: Payer: 59

## 2018-02-06 ENCOUNTER — Inpatient Hospital Stay: Payer: 59 | Attending: Oncology

## 2018-02-06 ENCOUNTER — Encounter: Payer: Self-pay | Admitting: Oncology

## 2018-02-06 ENCOUNTER — Inpatient Hospital Stay (HOSPITAL_BASED_OUTPATIENT_CLINIC_OR_DEPARTMENT_OTHER): Payer: 59 | Admitting: Oncology

## 2018-02-06 VITALS — BP 111/74 | HR 87 | Temp 97.0°F | Resp 16 | Wt 184.0 lb

## 2018-02-06 DIAGNOSIS — Z5112 Encounter for antineoplastic immunotherapy: Secondary | ICD-10-CM | POA: Insufficient documentation

## 2018-02-06 DIAGNOSIS — Z801 Family history of malignant neoplasm of trachea, bronchus and lung: Secondary | ICD-10-CM

## 2018-02-06 DIAGNOSIS — Z923 Personal history of irradiation: Secondary | ICD-10-CM

## 2018-02-06 DIAGNOSIS — Z85828 Personal history of other malignant neoplasm of skin: Secondary | ICD-10-CM

## 2018-02-06 DIAGNOSIS — Z853 Personal history of malignant neoplasm of breast: Secondary | ICD-10-CM

## 2018-02-06 DIAGNOSIS — R011 Cardiac murmur, unspecified: Secondary | ICD-10-CM

## 2018-02-06 DIAGNOSIS — R1310 Dysphagia, unspecified: Secondary | ICD-10-CM

## 2018-02-06 DIAGNOSIS — Z7689 Persons encountering health services in other specified circumstances: Secondary | ICD-10-CM

## 2018-02-06 DIAGNOSIS — Z79620 Long term (current) use of immunosuppressive biologic: Secondary | ICD-10-CM

## 2018-02-06 DIAGNOSIS — C8331 Diffuse large B-cell lymphoma, lymph nodes of head, face, and neck: Secondary | ICD-10-CM | POA: Diagnosis not present

## 2018-02-06 DIAGNOSIS — Z8041 Family history of malignant neoplasm of ovary: Secondary | ICD-10-CM

## 2018-02-06 DIAGNOSIS — Z79899 Other long term (current) drug therapy: Secondary | ICD-10-CM | POA: Diagnosis not present

## 2018-02-06 DIAGNOSIS — Z5111 Encounter for antineoplastic chemotherapy: Secondary | ICD-10-CM | POA: Insufficient documentation

## 2018-02-06 DIAGNOSIS — Z5181 Encounter for therapeutic drug level monitoring: Secondary | ICD-10-CM

## 2018-02-06 LAB — CBC WITH DIFFERENTIAL/PLATELET
BASOS ABS: 0 10*3/uL (ref 0–0.1)
Basophils Relative: 1 %
EOS ABS: 0 10*3/uL (ref 0–0.7)
EOS PCT: 0 %
HCT: 36.4 % (ref 35.0–47.0)
Hemoglobin: 12.4 g/dL (ref 12.0–16.0)
Lymphocytes Relative: 12 %
Lymphs Abs: 0.9 10*3/uL — ABNORMAL LOW (ref 1.0–3.6)
MCH: 29.4 pg (ref 26.0–34.0)
MCHC: 34.1 g/dL (ref 32.0–36.0)
MCV: 86 fL (ref 80.0–100.0)
Monocytes Absolute: 0.5 10*3/uL (ref 0.2–0.9)
Monocytes Relative: 8 %
Neutro Abs: 5.8 10*3/uL (ref 1.4–6.5)
Neutrophils Relative %: 79 %
PLATELETS: 207 10*3/uL (ref 150–440)
RBC: 4.23 MIL/uL (ref 3.80–5.20)
RDW: 13.5 % (ref 11.5–14.5)
WBC: 7.3 10*3/uL (ref 3.6–11.0)

## 2018-02-06 LAB — COMPREHENSIVE METABOLIC PANEL
ALT: 22 U/L (ref 14–54)
AST: 28 U/L (ref 15–41)
Albumin: 3.7 g/dL (ref 3.5–5.0)
Alkaline Phosphatase: 73 U/L (ref 38–126)
Anion gap: 11 (ref 5–15)
BILIRUBIN TOTAL: 0.5 mg/dL (ref 0.3–1.2)
BUN: 12 mg/dL (ref 6–20)
CO2: 25 mmol/L (ref 22–32)
CREATININE: 0.61 mg/dL (ref 0.44–1.00)
Calcium: 9.1 mg/dL (ref 8.9–10.3)
Chloride: 103 mmol/L (ref 101–111)
GFR calc Af Amer: >60 mL/min (ref >60)
Glucose, Bld: 134 mg/dL — ABNORMAL HIGH (ref 65–99)
Potassium: 3.7 mmol/L (ref 3.5–5.1)
Sodium: 139 mmol/L (ref 135–145)
TOTAL PROTEIN: 6.3 g/dL — AB (ref 6.5–8.1)

## 2018-02-06 MED ORDER — HEPARIN SOD (PORK) LOCK FLUSH 100 UNIT/ML IV SOLN
500.0000 [IU] | Freq: Once | INTRAVENOUS | Status: AC
  Administered 2018-02-06: 500 [IU] via INTRAVENOUS
  Filled 2018-02-06: qty 5

## 2018-02-06 MED ORDER — VINCRISTINE SULFATE CHEMO INJECTION 1 MG/ML
2.0000 mg | Freq: Once | INTRAVENOUS | Status: AC
  Administered 2018-02-06: 2 mg via INTRAVENOUS
  Filled 2018-02-06: qty 2

## 2018-02-06 MED ORDER — SODIUM CHLORIDE 0.9 % IV SOLN
Freq: Once | INTRAVENOUS | Status: AC
  Administered 2018-02-06: 10:00:00 via INTRAVENOUS
  Filled 2018-02-06: qty 1000

## 2018-02-06 MED ORDER — SODIUM CHLORIDE 0.9 % IV SOLN
1500.0000 mg | Freq: Once | INTRAVENOUS | Status: AC
  Administered 2018-02-06: 1500 mg via INTRAVENOUS
  Filled 2018-02-06: qty 25

## 2018-02-06 MED ORDER — DOXORUBICIN HCL CHEMO IV INJECTION 2 MG/ML
50.0000 mg/m2 | Freq: Once | INTRAVENOUS | Status: AC
  Administered 2018-02-06: 96 mg via INTRAVENOUS
  Filled 2018-02-06: qty 48

## 2018-02-06 MED ORDER — RITUXIMAB CHEMO INJECTION 500 MG/50ML
375.0000 mg/m2 | Freq: Once | INTRAVENOUS | Status: AC
  Administered 2018-02-06: 700 mg via INTRAVENOUS
  Filled 2018-02-06: qty 50

## 2018-02-06 MED ORDER — PALONOSETRON HCL INJECTION 0.25 MG/5ML
0.2500 mg | Freq: Once | INTRAVENOUS | Status: AC
  Administered 2018-02-06: 0.25 mg via INTRAVENOUS
  Filled 2018-02-06: qty 5

## 2018-02-06 MED ORDER — DIPHENHYDRAMINE HCL 25 MG PO CAPS
50.0000 mg | ORAL_CAPSULE | Freq: Once | ORAL | Status: AC
  Administered 2018-02-06: 50 mg via ORAL
  Filled 2018-02-06: qty 2

## 2018-02-06 MED ORDER — ACETAMINOPHEN 325 MG PO TABS
650.0000 mg | ORAL_TABLET | Freq: Once | ORAL | Status: AC
  Administered 2018-02-06: 650 mg via ORAL
  Filled 2018-02-06: qty 2

## 2018-02-06 MED ORDER — SODIUM CHLORIDE 0.9 % IV SOLN: 10.0000 mg | Freq: Once | INTRAVENOUS | Status: DC

## 2018-02-06 MED ORDER — DEXAMETHASONE SODIUM PHOSPHATE 10 MG/ML IJ SOLN
10.0000 mg | Freq: Once | INTRAMUSCULAR | Status: AC
  Administered 2018-02-06: 10 mg via INTRAVENOUS
  Filled 2018-02-06: qty 1

## 2018-02-06 MED ORDER — PEGFILGRASTIM 6 MG/0.6ML ~~LOC~~ PSKT
6.0000 mg | PREFILLED_SYRINGE | Freq: Once | SUBCUTANEOUS | Status: AC
  Administered 2018-02-06: 6 mg via SUBCUTANEOUS
  Filled 2018-02-06: qty 0.6

## 2018-02-06 MED ORDER — SODIUM CHLORIDE 0.9 % IV SOLN: 375.0000 mg/m2 | Freq: Once | INTRAVENOUS | Status: DC

## 2018-02-06 MED ORDER — DIPHENHYDRAMINE HCL 25 MG PO CAPS
ORAL_CAPSULE | ORAL | Status: AC
  Filled 2018-02-06: qty 1

## 2018-02-06 MED ORDER — SODIUM CHLORIDE 0.9% FLUSH
10.0000 mL | Freq: Once | INTRAVENOUS | Status: AC
  Administered 2018-02-06: 10 mL via INTRAVENOUS
  Filled 2018-02-06: qty 10

## 2018-02-09 NOTE — Progress Notes (Signed)
Hematology/Oncology Consult note [facility]  [phone] [phone]  Patient Care Team: Virginia Crews, MD as PCP - General (Family Medicine) Margarita Rana, MD as Referring Physician (Family Medicine) Bary Castilla Forest Gleason, MD (General Surgery)   Name of the patient: Victoria Benson  [id number]  11-04-1956   Date of visit: 02/09/18  Diagnosis- Stage II E DLBCL. ABC subtype. Not double hit.  Chief complaint/ Reason for visit- on treatment assessment prior to cycle #2 of R CHOP  Heme/Onc history:patient is a 62 year old female who developed symptoms of upper respiratory infection and nasal congestion sometime in November 2018. Around the same time she also developed a right neck mass which was initially attributed to possible tonsillitis and she was given antibiotics for the same. However her right neck mass did not decrease in size and she eventually saw Dr. Pryor Ochoa from ENT. Patient was found to have significant right cervical adenopathy and underwent CT of the soft tissue neck which showed right level 2 and 3 adenopathy favoring metastatic carcinoma. Jugulodigastric node measuring up to 3.7 cm. Asymmetric fullness of the right tonsillar fossa possibly primary site.  Patient underwent core biopsy of the right cervical lymph node.Pathology showed diffuse large B-cell lymphoma of the ABC subtype. IHC was positive for BCL-2 and BCL 6 see Mick IHC was not tested due to insufficiency of the testing sample. Ki-67 was high 80-90%.  Patient reports feeling well prior to November 2018. Currently she denies any fevers, chills, unintentional weight loss or drenching night sweats. She reports some difficulty swallowing because of the right neck mass but denies any shortness of breath. She did receive 1 dose of 100 mg prednisone yesterday and reports that her mass feels smaller and swallowing is easier.  PET/CT scan showed hypermetabolic  them in the tonsillar fossa bilaterally right greater than left as well as hypermetabolic lymphadenopathy in the right neck at the level 2 and level 3. No evidence of contralateral cervical adenopathy and no evidence of metastatic disease elsewhere  Bone marrow biopsy showed no evidence of lymphoma. IPI score 0- low risk. Does not meet criteria for CNS prophylaxis    Interval history-overall she is doing very well.  She denies any pain or fatigue.  Denies any difficulty swallowing.  Bowel movements are regular  ECOG PS- 0 Pain scale- 0   Review of systems- Review of Systems  Constitutional: Negative for chills, fever, malaise/fatigue and weight loss.  HENT: Negative for congestion, ear discharge and nosebleeds.   Eyes: Negative for blurred vision.  Respiratory: Negative for cough, hemoptysis, sputum production, shortness of breath and wheezing.   Cardiovascular: Negative for chest pain, palpitations, orthopnea and claudication.  Gastrointestinal: Negative for abdominal pain, blood in stool, constipation, diarrhea, heartburn, melena, nausea and vomiting.  Genitourinary: Negative for dysuria, flank pain, frequency, hematuria and urgency.  Musculoskeletal: Negative for back pain, joint pain and myalgias.  Skin: Negative for rash.  Neurological: Negative for dizziness, tingling, focal weakness, seizures, weakness and headaches.  Endo/Heme/Allergies: Does not bruise/bleed easily.  Psychiatric/Behavioral: Negative for depression and suicidal ideas. The patient does not have insomnia.      No Known Allergies   Past Medical History:  Diagnosis Date  . BRCA gene mutation negative 06/2016  . Breast cancer ([place]) 2009   T1c (1.2 cm) N0 ER 90%, PR 90%, HER-2/neu not amplified. Oncotype DX score, low risk (14) 9% chance of recurrence with antiestrogen therapy alone.  . Breast cancer ([place]) 06-17-16   INVASIVE  MAMMARY CARCINOMA . T1, ER positive, PR positive, HER-2/neu not overexpressed  .  Family history of adverse reaction to anesthesia    PTS MOM-HARD TO WAKE UP  . Heart murmur   . Radiation 2009   BREAST CA  . Skin cancer      Past Surgical History:  Procedure Laterality Date  . BREAST BIOPSY Right 06/17/2016   invasive. T1, ER positive, PR positive, HER-2/neu not overexpressed  . BREAST CYST ASPIRATION Left 2002  . BREAST EXCISIONAL BIOPSY Right 06/2016   lumpectomy with rad   . BREAST EXCISIONAL BIOPSY Right 2009   breast ca +  . BREAST LUMPECTOMY WITH NEEDLE LOCALIZATION Right 07/18/2016   Procedure: BREAST LUMPECTOMY WITH NEEDLE LOCALIZATION AND RIGHT AXILLARY EXPLORATION;  Surgeon: Robert Bellow, MD;  Location: [facility] ORS;  Service: General;  Laterality: Right;  . BREAST SURGERY Right July 2009   Wide excision, sentinel left node biopsy MammoSite. ( 06/21/2008  . CHOLECYSTECTOMY  1984  . DILATION AND CURETTAGE OF UTERUS  2004  . ENDOMETRIAL ABLATION  2005  . IR FLUORO GUIDE PORT INSERTION RIGHT  01/15/2018  . TUMOR REMOVAL  12/2013   from uterus done by [facility]    Social History   Socioeconomic History  . Marital status: Married    Spouse name: [name]  . Number of children: 2  . Years of education: 78  . Highest education level: Not on file  Social Needs  . Financial resource strain: Not on file  . Food insecurity - worry: Not on file  . Food insecurity - inability: Not on file  . Transportation needs - medical: Not on file  . Transportation needs - non-medical: Not on file  Occupational History  . Occupation: unemployed  Tobacco Use  . Smoking status: Never Smoker  . Smokeless tobacco: Never Used  Substance and Sexual Activity  . Alcohol use: No  . Drug use: No  . Sexual activity: Yes  Other Topics Concern  . Not on file  Social History Narrative  . Not on file    Family History  Problem Relation Age of Onset  . CAD Mother   . Osteoporosis Mother   . Lung cancer Father   . Diabetes Father   . Hypertension Brother   . Ovarian  cancer Maternal Grandmother   . Heart attack Paternal Grandmother   . Diabetes Paternal Grandfather   . Fibromyalgia Brother   . Hypertension Brother   . Hypertension Brother   . Sleep apnea Brother   . Healthy Brother   . Sleep apnea Brother   . Ovarian cancer Other   . Breast cancer Neg Hx      Current Outpatient Medications:  .  allopurinol (ZYLOPRIM) 300 MG tablet, Take 300 mg by mouth daily., Disp: , Rfl:  .  anastrozole (ARIMIDEX) 1 MG tablet, Take 1 tablet (1 mg total) by mouth daily., Disp: 90 tablet, Rfl: 3 .  CALCIUM-MAGNESIUM-VITAMIN D ER PO, Take 1 tablet by mouth daily., Disp: , Rfl:  .  cetirizine (ZYRTEC) 10 MG tablet, Take 10 mg daily by mouth., Disp: , Rfl:  .  fluticasone (FLONASE) 50 MCG/ACT nasal spray, Place 2 sprays daily into both nostrils., Disp: 16 g, Rfl: 6 .  lidocaine-prilocaine (EMLA) cream, Apply to affected area once, Disp: 30 g, Rfl: 3 .  Multiple Vitamin (MULTIVITAMIN) capsule, Take 1 capsule by mouth daily., Disp: , Rfl:  .  Omega-3 Krill Oil 500 MG CAPS, Take 1,000 mg by mouth daily. ,  Disp: , Rfl:  .  ondansetron (ZOFRAN) 8 MG tablet, Take 1 tablet (8 mg total) by mouth 2 (two) times daily as needed for nausea or vomiting. Start on day 3 after your treatment of cyclophosphamide chemotherapy, Disp: 20 tablet, Rfl: 1 .  predniSONE (DELTASONE) 50 MG tablet, Take 2 tablets (100 mg total) by mouth daily with breakfast. Take on day 1-5 of chemotherapy  With each treatment cycle, Disp: 40 tablet, Rfl: 0 .  prochlorperazine (COMPAZINE) 10 MG tablet, Take 1 tablet (10 mg total) by mouth every 6 (six) hours as needed for nausea or vomiting., Disp: 30 tablet, Rfl: 3 .  LORazepam (ATIVAN) 0.5 MG tablet, Take 1 tablet (0.5 mg total) by mouth every 6 (six) hours as needed (nausea and vomiting). (Patient not taking: Reported on 01/29/2018), Disp: 30 tablet, Rfl: 0  Physical exam:  Vitals:   02/06/18 0925  BP: 111/74  Pulse: 87  Resp: 16  Temp: (!) 97 F (36.1 C)   TempSrc: Tympanic  Weight: 184 lb (83.5 kg)   Physical Exam  Constitutional: She is oriented to person, place, and time and well-developed, well-nourished, and in no distress.  HENT:  Head: Normocephalic and atraumatic.  Eyes: EOM are normal. Pupils are equal, round, and reactive to light.  Neck: Normal range of motion.  Cardiovascular: Normal rate, regular rhythm and normal heart sounds.  Pulmonary/Chest: Effort normal and breath sounds normal.  Abdominal: Soft. Bowel sounds are normal.  Lymphadenopathy:  Right cervical adenopathy has decreased significantly but still palpable about 2 cm in size  Neurological: She is alert and oriented to person, place, and time.  Skin: Skin is warm and dry.     CMP Latest Ref Rng & Units 02/06/2018  Glucose 65 - 99 mg/dL 134(H)  BUN 6 - 20 mg/dL 12  Creatinine 0.44 - 1.00 mg/dL 0.61  Sodium 135 - 145 mmol/L 139  Potassium 3.5 - 5.1 mmol/L 3.7  Chloride 101 - 111 mmol/L 103  CO2 22 - 32 mmol/L 25  Calcium 8.9 - 10.3 mg/dL 9.1  Total Protein 6.5 - 8.1 g/dL 6.3(L)  Total Bilirubin 0.3 - 1.2 mg/dL 0.5  Alkaline Phos 38 - 126 U/L 73  AST 15 - 41 U/L 28  ALT 14 - 54 U/L 22   CBC Latest Ref Rng & Units 02/06/2018  WBC 3.6 - 11.0 K/uL 7.3  Hemoglobin 12.0 - 16.0 g/dL 12.4  Hematocrit 35.0 - 47.0 % 36.4  Platelets 150 - 440 K/uL 207    No images are attached to the encounter.  Nm Cardiac Muga Rest  Result Date: 01/14/2018 CLINICAL DATA:  Diffuse large B-cell lymphoma, pre cardiotoxic chemotherapy EXAM: NUCLEAR MEDICINE CARDIAC BLOOD POOL IMAGING (MUGA) TECHNIQUE: Cardiac multi-gated acquisition was performed at rest following intravenous injection of Tc-2mlabeled red blood cells. RADIOPHARMACEUTICALS:  20.551 mCi [id number] in-vitro labeled autologous red blood cells IV COMPARISON:  None FINDINGS: LEFT ventricular ejection fraction is calculated at 53%. Study was obtained at a cardiac rate of 67 beats per minute. Patient was rhythmic  during imaging. No focal LEFT ventricular wall motion abnormalities identified. IMPRESSION: Calculated LEFT ventricular ejection fraction of 53%. Normal LEFT ventricular wall motion. Electronically Signed   By: MaLavonia Dana.D.   On: 01/14/2018 15:48   Nm Pet Image Initial (pi) Skull Base To Thigh  Result Date: 01/12/2018 CLINICAL DATA:  Initial treatment strategy for diffuse large B-cell lymphoma. EXAM: NUCLEAR MEDICINE PET SKULL BASE TO THIGH TECHNIQUE: 12.2 mCi F-18 FDG was  injected intravenously. Full-ring PET imaging was performed from the skull base to thigh after the radiotracer. CT data was obtained and used for attenuation correction and anatomic localization. FASTING BLOOD GLUCOSE:  Value: 96 mg/dl COMPARISON:  Neck CT 12/15/2017. FINDINGS: NECK: The asymmetric fullness seen previously in the right tonsillar fossa is also asymmetrically hypermetabolic with SUV max = 13.0. Uptake in the contralateral tonsillar fossa has SUV max = 6.5. The level II/III lymphadenopathy identified in the right neck on the previous CT demonstrates hypermetabolism. Discrete level II lymph node seen on image 30 series 3 measures 12 mm and demonstrates SUV max = 11.0. Dominant right-sided level II node seen on image 38 shows central photopenia compatible with necrosis. Discrete 10 mm posterior right level III lymph node seen on image 39 demonstrates SUV max = 12.7. No evidence for hypermetabolic lymphadenopathy in the left neck. CHEST: No hypermetabolic mediastinal or hilar nodes. No suspicious pulmonary nodules on the CT scan. ABDOMEN/PELVIS: No abnormal hypermetabolic activity within the liver, pancreas, adrenal glands, or spleen. No hypermetabolic lymph nodes in the abdomen or pelvis. Atherosclerotic calcification noted in the abdominal aorta without aneurysm. SKELETON: No focal hypermetabolic activity to suggest skeletal metastasis. Sclerotic lesion in the L3 vertebral body shows no hypermetabolism on PET imaging.  IMPRESSION: 1. Hypermetabolism identified in the tonsillar fossa bilaterally, right greater than left. Hypermetabolism on today's study corresponds to the lesion seen on the recent diagnostic CT of the neck. 2. Hypermetabolic lymphadenopathy in the right neck (level II and III). No evidence for hypermetabolic lymphadenopathy in the contralateral neck. 3. No hypermetabolic disease in the chest, abdomen, or pelvis. No evidence for hypermetabolic disease in the skeleton. Electronically Signed   By: Misty Stanley M.D.   On: 01/12/2018 09:31   Ct Bone Marrow Biopsy  Result Date: 01/19/2018 INDICATION: Lymphoma EXAM: CT-GUIDED BONE MARROW BIOPSY MEDICATIONS: None. ANESTHESIA/SEDATION: Moderate (conscious) sedation was employed during this procedure. A total of Versed 2 mg and Fentanyl 100 mcg was administered intravenously. Moderate Sedation Time: 11 minutes. The patient's level of consciousness and vital signs were monitored continuously by radiology nursing throughout the procedure under my direct supervision. FLUOROSCOPY TIME:  Fluoroscopy Time:  minutes  seconds ( mGy). COMPLICATIONS: None immediate. PROCEDURE: Informed written consent was obtained from the patient after a thorough discussion of the procedural risks, benefits and alternatives. All questions were addressed. Maximal Sterile Barrier Technique was utilized including caps, mask, sterile gowns, sterile gloves, sterile drape, hand hygiene and skin antiseptic. A timeout was performed prior to the initiation of the procedure. Under CT guidance, an 11 gauge needle was advanced into the right iliac bone via posterior approach aspirates and a core were obtained. FINDINGS: Imaging documents needle placement in the right iliac bone. Post biopsy images demonstrate no evidence of hemorrhage. IMPRESSION: Successful CT-guided right iliac bone marrow aspirate and core. Electronically Signed   By: Marybelle Killings M.D.   On: 01/19/2018 10:20   Ir Fluoro Guide Port  Insertion Right  Result Date: 01/15/2018 INDICATION: History of lymphoma. In need of durable intravenous access for chemotherapy administration. EXAM: IMPLANTED PORT A CATH PLACEMENT WITH ULTRASOUND AND FLUOROSCOPIC GUIDANCE COMPARISON:  PET-CT - 01/12/2018 MEDICATIONS: Ancef 2 gm IV; The antibiotic was administered within an appropriate time interval prior to skin puncture. ANESTHESIA/SEDATION: Moderate (conscious) sedation was employed during this procedure. A total of Versed 2.5 mg and Fentanyl 75 mcg was administered intravenously. Moderate Sedation Time: 27 minutes. The patient's level of consciousness and vital signs were monitored continuously by  radiology nursing throughout the procedure under my direct supervision. CONTRAST:  None FLUOROSCOPY TIME:  18 seconds (7 mGy) COMPLICATIONS: None immediate. PROCEDURE: The procedure, risks, benefits, and alternatives were explained to the patient. Questions regarding the procedure were encouraged and answered. The patient understands and consents to the procedure. The right neck and chest were prepped with chlorhexidine in a sterile fashion, and a sterile drape was applied covering the operative field. Maximum barrier sterile technique with sterile gowns and gloves were used for the procedure. A timeout was performed prior to the initiation of the procedure. Local anesthesia was provided with 1% lidocaine with epinephrine. After creating a small venotomy incision, a micropuncture kit was utilized to access the internal jugular vein. Real-time ultrasound guidance was utilized for vascular access including the acquisition of a permanent ultrasound image documenting patency of the accessed vessel. The microwire was utilized to measure appropriate catheter length. A subcutaneous port pocket was then created along the upper chest wall utilizing a combination of sharp and blunt dissection. The pocket was irrigated with sterile saline. A single lumen ISP power injectable  port was chosen for placement. The 8 Fr catheter was tunneled from the port pocket site to the venotomy incision. The port was placed in the pocket. The external catheter was trimmed to appropriate length. At the venotomy, an 8 Fr peel-away sheath was placed over a guidewire under fluoroscopic guidance. The catheter was then placed through the sheath and the sheath was removed. Final catheter positioning was confirmed and documented with a fluoroscopic spot radiograph. The port was accessed with a Huber needle, aspirated and flushed with heparinized saline. The venotomy site was closed with an interrupted 4-0 Vicryl suture. The port pocket incision was closed with interrupted 3-0 Vicryl suture and the skin was opposed with a running subcuticular 4-0 Vicryl suture. Dermabond and Steri-strips were applied to both incisions. Dressings were placed. The patient tolerated the procedure well without immediate post procedural complication. FINDINGS: After catheter placement, the tip lies within the superior cavoatrial junction. The catheter aspirates and flushes normally and is ready for immediate use. IMPRESSION: Successful placement of a right internal jugular approach power injectable Port-A-Cath. The catheter is ready for immediate use. Electronically Signed   By: Sandi Mariscal M.D.   On: 01/15/2018 16:18     Assessment and plan- Patient is a 62 y.o. female with Stage II E DLBCL abc subtype not double hit. Low risk IPI score 0 here for on treatment assessment prior to cycle #2 of R CHOP  Counts okay to proceed with R CHOP chemotherapy #2 today with on pro-Neulasta support.  I will see her back in 3 weeks time with CBC and CMP and LDH prior to cycle #3 of R CHOP.  She will start taking 100 mg of prednisone for 5 days starting today.  Plan is to repeat PET/CT scan after 3 cycles.     Visit Diagnosis 1. Diffuse large B-cell lymphoma of lymph nodes of neck (HCC)   2. Encounter for antineoplastic chemotherapy   3.  Encounter for monitoring rituximab therapy      Dr. Randa Evens, MD, MPH [facility] Pager- [phone] 02/09/2018 8:10 AM

## 2018-02-11 ENCOUNTER — Encounter: Payer: Self-pay | Admitting: Oncology

## 2018-02-12 ENCOUNTER — Encounter (HOSPITAL_COMMUNITY): Payer: Self-pay

## 2018-02-12 ENCOUNTER — Encounter: Payer: Self-pay | Admitting: *Deleted

## 2018-02-12 LAB — CHROMOSOME ANALYSIS, BONE MARROW

## 2018-02-18 ENCOUNTER — Other Ambulatory Visit: Payer: Self-pay | Admitting: General Surgery

## 2018-02-18 DIAGNOSIS — C50211 Malignant neoplasm of upper-inner quadrant of right female breast: Secondary | ICD-10-CM

## 2018-02-18 DIAGNOSIS — Z170 Estrogen receptor positive status [ER+]: Principal | ICD-10-CM

## 2018-02-27 ENCOUNTER — Inpatient Hospital Stay: Payer: Commercial Managed Care - HMO

## 2018-02-27 ENCOUNTER — Inpatient Hospital Stay: Payer: Commercial Managed Care - HMO | Attending: Oncology | Admitting: Oncology

## 2018-02-27 ENCOUNTER — Encounter: Payer: Self-pay | Admitting: Oncology

## 2018-02-27 VITALS — BP 107/72 | HR 85 | Temp 97.8°F | Resp 18 | Wt 187.0 lb

## 2018-02-27 VITALS — BP 107/66 | HR 88 | Temp 97.8°F | Resp 18

## 2018-02-27 DIAGNOSIS — Z8041 Family history of malignant neoplasm of ovary: Secondary | ICD-10-CM | POA: Insufficient documentation

## 2018-02-27 DIAGNOSIS — C50911 Malignant neoplasm of unspecified site of right female breast: Secondary | ICD-10-CM | POA: Diagnosis not present

## 2018-02-27 DIAGNOSIS — Z7689 Persons encountering health services in other specified circumstances: Secondary | ICD-10-CM | POA: Diagnosis not present

## 2018-02-27 DIAGNOSIS — Z801 Family history of malignant neoplasm of trachea, bronchus and lung: Secondary | ICD-10-CM | POA: Diagnosis not present

## 2018-02-27 DIAGNOSIS — Z79811 Long term (current) use of aromatase inhibitors: Secondary | ICD-10-CM | POA: Diagnosis not present

## 2018-02-27 DIAGNOSIS — Z79899 Other long term (current) drug therapy: Secondary | ICD-10-CM | POA: Diagnosis not present

## 2018-02-27 DIAGNOSIS — R531 Weakness: Secondary | ICD-10-CM | POA: Diagnosis not present

## 2018-02-27 DIAGNOSIS — R011 Cardiac murmur, unspecified: Secondary | ICD-10-CM

## 2018-02-27 DIAGNOSIS — R5383 Other fatigue: Secondary | ICD-10-CM | POA: Insufficient documentation

## 2018-02-27 DIAGNOSIS — Z85828 Personal history of other malignant neoplasm of skin: Secondary | ICD-10-CM | POA: Diagnosis not present

## 2018-02-27 DIAGNOSIS — Z923 Personal history of irradiation: Secondary | ICD-10-CM | POA: Insufficient documentation

## 2018-02-27 DIAGNOSIS — C8331 Diffuse large B-cell lymphoma, lymph nodes of head, face, and neck: Secondary | ICD-10-CM

## 2018-02-27 DIAGNOSIS — Z803 Family history of malignant neoplasm of breast: Secondary | ICD-10-CM | POA: Diagnosis not present

## 2018-02-27 DIAGNOSIS — Z5111 Encounter for antineoplastic chemotherapy: Secondary | ICD-10-CM

## 2018-02-27 DIAGNOSIS — Z170 Estrogen receptor positive status [ER+]: Secondary | ICD-10-CM | POA: Diagnosis not present

## 2018-02-27 DIAGNOSIS — Z853 Personal history of malignant neoplasm of breast: Secondary | ICD-10-CM | POA: Insufficient documentation

## 2018-02-27 DIAGNOSIS — Z5181 Encounter for therapeutic drug level monitoring: Secondary | ICD-10-CM

## 2018-02-27 DIAGNOSIS — Z79620 Long term (current) use of immunosuppressive biologic: Secondary | ICD-10-CM

## 2018-02-27 LAB — COMPREHENSIVE METABOLIC PANEL
ALT: 16 U/L (ref 14–54)
AST: 28 U/L (ref 15–41)
Albumin: 3.7 g/dL (ref 3.5–5.0)
Alkaline Phosphatase: 72 U/L (ref 38–126)
Anion gap: 7 (ref 5–15)
BUN: 10 mg/dL (ref 6–20)
CHLORIDE: 106 mmol/L (ref 101–111)
CO2: 27 mmol/L (ref 22–32)
Calcium: 9.3 mg/dL (ref 8.9–10.3)
Creatinine, Ser: 0.56 mg/dL (ref 0.44–1.00)
GFR calc non Af Amer: >60 mL/min (ref >60)
Glucose, Bld: 122 mg/dL — ABNORMAL HIGH (ref 65–99)
POTASSIUM: 3.6 mmol/L (ref 3.5–5.1)
SODIUM: 140 mmol/L (ref 135–145)
Total Bilirubin: 0.5 mg/dL (ref 0.3–1.2)
Total Protein: 6.5 g/dL (ref 6.5–8.1)

## 2018-02-27 LAB — CBC WITH DIFFERENTIAL/PLATELET
Basophils Absolute: 0 10*3/uL (ref 0–0.1)
Basophils Relative: 1 %
Eosinophils Absolute: 0 10*3/uL (ref 0–0.7)
Eosinophils Relative: 1 %
HEMATOCRIT: 35.1 % (ref 35.0–47.0)
HEMOGLOBIN: 12.1 g/dL (ref 12.0–16.0)
LYMPHS ABS: 0.7 10*3/uL — AB (ref 1.0–3.6)
LYMPHS PCT: 13 %
MCH: 30.3 pg (ref 26.0–34.0)
MCHC: 34.5 g/dL (ref 32.0–36.0)
MCV: 87.9 fL (ref 80.0–100.0)
Monocytes Absolute: 0.6 10*3/uL (ref 0.2–0.9)
Monocytes Relative: 11 %
NEUTROS ABS: 4 10*3/uL (ref 1.4–6.5)
NEUTROS PCT: 74 %
Platelets: 186 10*3/uL (ref 150–440)
RBC: 4 MIL/uL (ref 3.80–5.20)
RDW: 17.3 % — ABNORMAL HIGH (ref 11.5–14.5)
WBC: 5.4 10*3/uL (ref 3.6–11.0)

## 2018-02-27 LAB — LACTATE DEHYDROGENASE: LDH: 127 U/L (ref 98–192)

## 2018-02-27 MED ORDER — SODIUM CHLORIDE 0.9 % IV SOLN: 375.0000 mg/m2 | Freq: Once | INTRAVENOUS | Status: DC

## 2018-02-27 MED ORDER — SODIUM CHLORIDE 0.9 % IV SOLN
Freq: Once | INTRAVENOUS | Status: AC
  Administered 2018-02-27: 10:00:00 via INTRAVENOUS
  Filled 2018-02-27: qty 1000

## 2018-02-27 MED ORDER — HEPARIN SOD (PORK) LOCK FLUSH 100 UNIT/ML IV SOLN
500.0000 [IU] | Freq: Once | INTRAVENOUS | Status: AC | PRN
  Administered 2018-02-27: 500 [IU]
  Filled 2018-02-27: qty 5

## 2018-02-27 MED ORDER — PALONOSETRON HCL INJECTION 0.25 MG/5ML
0.2500 mg | Freq: Once | INTRAVENOUS | Status: AC
  Administered 2018-02-27: 0.25 mg via INTRAVENOUS
  Filled 2018-02-27: qty 5

## 2018-02-27 MED ORDER — DOXORUBICIN HCL CHEMO IV INJECTION 2 MG/ML
50.0000 mg/m2 | Freq: Once | INTRAVENOUS | Status: AC
  Administered 2018-02-27: 96 mg via INTRAVENOUS
  Filled 2018-02-27: qty 48

## 2018-02-27 MED ORDER — PEGFILGRASTIM 6 MG/0.6ML ~~LOC~~ PSKT
6.0000 mg | PREFILLED_SYRINGE | Freq: Once | SUBCUTANEOUS | Status: AC
  Administered 2018-02-27: 6 mg via SUBCUTANEOUS
  Filled 2018-02-27: qty 0.6

## 2018-02-27 MED ORDER — DEXAMETHASONE SODIUM PHOSPHATE 10 MG/ML IJ SOLN
10.0000 mg | Freq: Once | INTRAMUSCULAR | Status: AC
  Administered 2018-02-27: 10 mg via INTRAVENOUS
  Filled 2018-02-27: qty 1

## 2018-02-27 MED ORDER — VINCRISTINE SULFATE CHEMO INJECTION 1 MG/ML
2.0000 mg | Freq: Once | INTRAVENOUS | Status: AC
  Administered 2018-02-27: 2 mg via INTRAVENOUS
  Filled 2018-02-27: qty 2

## 2018-02-27 MED ORDER — SODIUM CHLORIDE 0.9 % IV SOLN
375.0000 mg/m2 | Freq: Once | INTRAVENOUS | Status: AC
  Administered 2018-02-27: 700 mg via INTRAVENOUS
  Filled 2018-02-27: qty 50

## 2018-02-27 MED ORDER — SODIUM CHLORIDE 0.9% FLUSH
10.0000 mL | INTRAVENOUS | Status: DC | PRN
  Filled 2018-02-27: qty 10

## 2018-02-27 MED ORDER — DEXAMETHASONE SODIUM PHOSPHATE 100 MG/10ML IJ SOLN: 10.0000 mg | Freq: Once | INTRAMUSCULAR | Status: DC

## 2018-02-27 MED ORDER — ACETAMINOPHEN 325 MG PO TABS
650.0000 mg | ORAL_TABLET | Freq: Once | ORAL | Status: AC
  Administered 2018-02-27: 650 mg via ORAL
  Filled 2018-02-27: qty 2

## 2018-02-27 MED ORDER — SODIUM CHLORIDE 0.9 % IV SOLN
1500.0000 mg | Freq: Once | INTRAVENOUS | Status: AC
  Administered 2018-02-27: 1500 mg via INTRAVENOUS
  Filled 2018-02-27: qty 50

## 2018-02-27 MED ORDER — DIPHENHYDRAMINE HCL 25 MG PO CAPS
50.0000 mg | ORAL_CAPSULE | Freq: Once | ORAL | Status: AC
  Administered 2018-02-27: 50 mg via ORAL
  Filled 2018-02-27: qty 2

## 2018-02-27 NOTE — Progress Notes (Signed)
Pt in for follow up, scheduled for chemo treatment today.  Reports doing well.  States did not have any nausea or vomiting after last treatment.  Pt reports she did have some diarrhea after last treatment but it has resolved.  Husband is present with patient today

## 2018-02-27 NOTE — Progress Notes (Signed)
Blood return noted before, every 3 cc and after Adriamycin push, blood return noted before and after Vincristine infusion.

## 2018-02-27 NOTE — Progress Notes (Signed)
Hematology/Oncology Consult note [facility]  Telephone:([phone] [phone]  Patient Care Team: Virginia Crews, MD as PCP - General (Family Medicine) Margarita Rana, MD as Referring Physician (Family Medicine) Bary Castilla Forest Gleason, MD (General Surgery)   Name of the patient: Victoria Benson  [id number]  11/30/1956   Date of visit: 02/27/18  Diagnosis- Stage II E DLBCL. ABCsubtype. Not double hit.  Chief complaint/ Reason for visit-on treatment assessment prior to cycle # 3 of R CHOP  Heme/Onc history:patient is a 62 year old female who developed symptoms of upper respiratory infection and nasal congestion sometime in November 2018. Around the same time she also developed a right neck mass which was initially attributed to possible tonsillitis and she was given antibiotics for the same. However her right neck mass did not decrease in size and she eventually saw Dr. Pryor Ochoa from ENT. Patient was found to have significant right cervical adenopathy and underwent CT of the soft tissue neck which showed right level 2 and 3 adenopathy favoring metastatic carcinoma. Jugulodigastric node measuring up to 3.7 cm. Asymmetric fullness of the right tonsillar fossa possibly primary site.  Patient underwent core biopsy of the right cervical lymph node.Pathology showed diffuse large B-cell lymphoma of the ABC subtype. IHC was positive for BCL-2 and BCL 6 see Mick IHC was not tested due to insufficiency of the testing sample. Ki-67 was high 80-90%.  Patient reports feeling well prior to November 2018. Currently she denies any fevers, chills, unintentional weight loss or drenching night sweats. She reports some difficulty swallowing because of the right neck mass but denies any shortness of breath. She did receive 1 dose of 100 mg prednisone yesterday and reports that her mass feels smaller and swallowing is easier.  PET/CT scan showed hypermetabolic  them in the tonsillar fossa bilaterally right greater than left as well as hypermetabolic lymphadenopathy in the right neck at the level 2 and level 3. No evidence of contralateral cervical adenopathy and no evidence of metastatic disease elsewhere  Bone marrow biopsy showed no evidence of lymphoma. IPI score 0- low risk. Does not meet criteria for CNS prophylaxis    Interval history- she felt a lump like sensation and difficulty swallowing a week back that lasted for 2 days and resolved. She denies other complaints  ECOG PS- 0 Pain scale- 0   Review of systems- Review of Systems  Constitutional: Negative for chills, fever, malaise/fatigue and weight loss.  HENT: Negative for congestion, ear discharge and nosebleeds.   Eyes: Negative for blurred vision.  Respiratory: Negative for cough, hemoptysis, sputum production, shortness of breath and wheezing.   Cardiovascular: Negative for chest pain, palpitations, orthopnea and claudication.  Gastrointestinal: Negative for abdominal pain, blood in stool, constipation, diarrhea, heartburn, melena, nausea and vomiting.  Genitourinary: Negative for dysuria, flank pain, frequency, hematuria and urgency.  Musculoskeletal: Negative for back pain, joint pain and myalgias.  Skin: Negative for rash.  Neurological: Negative for dizziness, tingling, focal weakness, seizures, weakness and headaches.  Endo/Heme/Allergies: Does not bruise/bleed easily.  Psychiatric/Behavioral: Negative for depression and suicidal ideas. The patient does not have insomnia.       No Known Allergies   Past Medical History:  Diagnosis Date  . BRCA gene mutation negative 07/10/2016  . Breast cancer ([place]) 2009   T1c (1.2 cm) N0 ER 90%, PR 90%, HER-2/neu not amplified. Oncotype DX score, low risk (14) 9% chance of recurrence with antiestrogen therapy alone.  . Breast cancer ([place]) 06-17-16  INVASIVE MAMMARY CARCINOMA . T1, ER positive, PR positive, HER-2/neu not  overexpressed  . Family history of adverse reaction to anesthesia    PTS MOM-HARD TO WAKE UP  . Heart murmur   . Radiation 2009   BREAST CA  . Skin cancer      Past Surgical History:  Procedure Laterality Date  . BREAST BIOPSY Right 06/17/2016   invasive. T1, ER positive, PR positive, HER-2/neu not overexpressed  . BREAST CYST ASPIRATION Left 2002  . BREAST EXCISIONAL BIOPSY Right 06/2016   lumpectomy with rad   . BREAST EXCISIONAL BIOPSY Right 2009   breast ca +  . BREAST LUMPECTOMY WITH NEEDLE LOCALIZATION Right 07/18/2016   Procedure: BREAST LUMPECTOMY WITH NEEDLE LOCALIZATION AND RIGHT AXILLARY EXPLORATION;  Surgeon: Robert Bellow, MD;  Location: [facility] ORS;  Service: General;  Laterality: Right;  . BREAST SURGERY Right July 2009   Wide excision, sentinel left node biopsy MammoSite. ( 06/21/2008  . CHOLECYSTECTOMY  1984  . DILATION AND CURETTAGE OF UTERUS  2004  . ENDOMETRIAL ABLATION  2005  . IR FLUORO GUIDE PORT INSERTION RIGHT  01/15/2018  . TUMOR REMOVAL  12/2013   from uterus done by [facility]    Social History   Socioeconomic History  . Marital status: Married    Spouse name: [name]  . Number of children: 2  . Years of education: 67  . Highest education level: Not on file  Social Needs  . Financial resource strain: Not on file  . Food insecurity - worry: Not on file  . Food insecurity - inability: Not on file  . Transportation needs - medical: Not on file  . Transportation needs - non-medical: Not on file  Occupational History  . Occupation: unemployed  Tobacco Use  . Smoking status: Never Smoker  . Smokeless tobacco: Never Used  Substance and Sexual Activity  . Alcohol use: No  . Drug use: No  . Sexual activity: Yes  Other Topics Concern  . Not on file  Social History Narrative  . Not on file    Family History  Problem Relation Age of Onset  . CAD Mother   . Osteoporosis Mother   . Lung cancer Father   . Diabetes Father   . Hypertension  Brother   . Ovarian cancer Maternal Grandmother   . Heart attack Paternal Grandmother   . Diabetes Paternal Grandfather   . Fibromyalgia Brother   . Hypertension Brother   . Hypertension Brother   . Sleep apnea Brother   . Healthy Brother   . Sleep apnea Brother   . Ovarian cancer Other   . Breast cancer Neg Hx      Current Outpatient Medications:  .  allopurinol (ZYLOPRIM) 300 MG tablet, Take 300 mg by mouth daily., Disp: , Rfl:  .  anastrozole (ARIMIDEX) 1 MG tablet, TAKE ONE TABLET BY MOUTH EVERY DAY, Disp: 90 tablet, Rfl: 3 .  CALCIUM-MAGNESIUM-VITAMIN D ER PO, Take 1 tablet by mouth daily., Disp: , Rfl:  .  cetirizine (ZYRTEC) 10 MG tablet, Take 10 mg daily by mouth., Disp: , Rfl:  .  fluticasone (FLONASE) 50 MCG/ACT nasal spray, Place 2 sprays daily into both nostrils., Disp: 16 g, Rfl: 6 .  lidocaine-prilocaine (EMLA) cream, Apply to affected area once, Disp: 30 g, Rfl: 3 .  LORazepam (ATIVAN) 0.5 MG tablet, Take 1 tablet (0.5 mg total) by mouth every 6 (six) hours as needed (nausea and vomiting). (Patient not taking: Reported on 01/29/2018), Disp:  30 tablet, Rfl: 0 .  Multiple Vitamin (MULTIVITAMIN) capsule, Take 1 capsule by mouth daily., Disp: , Rfl:  .  Omega-3 Krill Oil 500 MG CAPS, Take 1,000 mg by mouth daily. , Disp: , Rfl:  .  ondansetron (ZOFRAN) 8 MG tablet, Take 1 tablet (8 mg total) by mouth 2 (two) times daily as needed for nausea or vomiting. Start on day 3 after your treatment of cyclophosphamide chemotherapy, Disp: 20 tablet, Rfl: 1 .  predniSONE (DELTASONE) 50 MG tablet, Take 2 tablets (100 mg total) by mouth daily with breakfast. Take on day 1-5 of chemotherapy  With each treatment cycle, Disp: 40 tablet, Rfl: 0 .  prochlorperazine (COMPAZINE) 10 MG tablet, Take 1 tablet (10 mg total) by mouth every 6 (six) hours as needed for nausea or vomiting., Disp: 30 tablet, Rfl: 3  Physical exam:  Vitals:   02/27/18 0853  BP: 107/72  Pulse: 85  Resp: 18  Temp: 97.8  F (36.6 C)  TempSrc: Tympanic  Weight: 187 lb (84.8 kg)   Physical Exam  Constitutional: She is oriented to person, place, and time and well-developed, well-nourished, and in no distress.  HENT:  Head: Normocephalic and atraumatic.  Eyes: EOM are normal. Pupils are equal, round, and reactive to light.  Neck: Normal range of motion.  Cardiovascular: Normal rate, regular rhythm and normal heart sounds.  Pulmonary/Chest: Effort normal and breath sounds normal.  Abdominal: Soft. Bowel sounds are normal.  Neurological: She is alert and oriented to person, place, and time.  Skin: Skin is warm and dry.   there is residual thickness in the right cervical region but no distinct mass that I can feel at this time  CMP Latest Ref Rng & Units 02/27/2018  Glucose 65 - 99 mg/dL 122(H)  BUN 6 - 20 mg/dL 10  Creatinine 0.44 - 1.00 mg/dL 0.56  Sodium 135 - 145 mmol/L 140  Potassium 3.5 - 5.1 mmol/L 3.6  Chloride 101 - 111 mmol/L 106  CO2 22 - 32 mmol/L 27  Calcium 8.9 - 10.3 mg/dL 9.3  Total Protein 6.5 - 8.1 g/dL 6.5  Total Bilirubin 0.3 - 1.2 mg/dL 0.5  Alkaline Phos 38 - 126 U/L 72  AST 15 - 41 U/L 28  ALT 14 - 54 U/L 16   CBC Latest Ref Rng & Units 02/27/2018  WBC 3.6 - 11.0 K/uL 5.4  Hemoglobin 12.0 - 16.0 g/dL 12.1  Hematocrit 35.0 - 47.0 % 35.1  Platelets 150 - 440 K/uL 186    No images are attached to the encounter.  No results found.   Assessment and plan- Patient is a 62 y.o. female with Stage II E DLBCL abc subtypenot double hit. Low risk IPI score 0 here for on treatment assessment prior to cycle # 3 of R CHOP  Counts ok to proceed with cycle 3 of RCHOP today. Clinically she has responded well. There is some residual thickness in her right neck. unclear if this is residual disease versus treatment effect. She started Day 1 of prednisone Cycle 3 RCHOP today  Repeat PET/CT scan 2 weeks from now. If she achieves CR on PET- she will proceed to IFRT to her neck. If there is is  residual disease, I will proceed with 3 more cycles of RCHOP  rtc in 3 weeks to discuss PET/CT. Check cbc/ cmp that day. Stop allopurinol at this time. Possible RCHOP based on scan results    Visit Diagnosis 1. Diffuse large B-cell lymphoma of lymph nodes  of neck ([place])   2. Encounter for antineoplastic chemotherapy   3. Visit for monitoring Rituxan therapy      Dr. Randa Evens, MD, MPH [facility] at [facility] Pager- [phone] 02/27/2018 9:15 AM

## 2018-03-17 ENCOUNTER — Telehealth: Payer: Self-pay | Admitting: *Deleted

## 2018-03-17 NOTE — Telephone Encounter (Signed)
Called pt and let her know that we got approval for pet and then I scheduled it and they have available for Thursday 3/21 at 10 am arrival to [facility] mall and 10:30 scan/ NPO after midnight.  She understands the above instructions and will have scan done.

## 2018-03-19 ENCOUNTER — Ambulatory Visit
Admission: RE | Admit: 2018-03-19 | Discharge: 2018-03-19 | Disposition: A | Discharge status: Home / Self Care | Payer: 59 | Source: Ambulatory Visit | Attending: Oncology | Admitting: Oncology

## 2018-03-19 DIAGNOSIS — C8331 Diffuse large B-cell lymphoma, lymph nodes of head, face, and neck: Secondary | ICD-10-CM | POA: Diagnosis present

## 2018-03-19 DIAGNOSIS — C8330 Diffuse large B-cell lymphoma, unspecified site: Secondary | ICD-10-CM | POA: Diagnosis not present

## 2018-03-19 DIAGNOSIS — R918 Other nonspecific abnormal finding of lung field: Secondary | ICD-10-CM | POA: Diagnosis not present

## 2018-03-19 LAB — GLUCOSE, CAPILLARY: Glucose-Capillary: 92 mg/dL (ref 65–99)

## 2018-03-19 MED ORDER — FLUDEOXYGLUCOSE F - 18 (FDG) INJECTION
9.9600 | Freq: Once | INTRAVENOUS | Status: AC | PRN
  Administered 2018-03-19: 9.96 via INTRAVENOUS

## 2018-03-20 ENCOUNTER — Inpatient Hospital Stay (HOSPITAL_BASED_OUTPATIENT_CLINIC_OR_DEPARTMENT_OTHER): Payer: Commercial Managed Care - HMO | Admitting: Oncology

## 2018-03-20 ENCOUNTER — Encounter: Payer: Self-pay | Admitting: Oncology

## 2018-03-20 ENCOUNTER — Inpatient Hospital Stay: Payer: Commercial Managed Care - HMO

## 2018-03-20 VITALS — BP 111/73 | HR 90 | Temp 98.7°F | Resp 18 | Ht 63.9 in | Wt 187.8 lb

## 2018-03-20 DIAGNOSIS — C8331 Diffuse large B-cell lymphoma, lymph nodes of head, face, and neck: Secondary | ICD-10-CM

## 2018-03-20 DIAGNOSIS — Z803 Family history of malignant neoplasm of breast: Secondary | ICD-10-CM

## 2018-03-20 DIAGNOSIS — C50911 Malignant neoplasm of unspecified site of right female breast: Secondary | ICD-10-CM | POA: Diagnosis not present

## 2018-03-20 DIAGNOSIS — Z79899 Other long term (current) drug therapy: Secondary | ICD-10-CM | POA: Diagnosis not present

## 2018-03-20 DIAGNOSIS — Z853 Personal history of malignant neoplasm of breast: Secondary | ICD-10-CM

## 2018-03-20 DIAGNOSIS — R5383 Other fatigue: Secondary | ICD-10-CM | POA: Diagnosis not present

## 2018-03-20 DIAGNOSIS — Z923 Personal history of irradiation: Secondary | ICD-10-CM

## 2018-03-20 DIAGNOSIS — Z8041 Family history of malignant neoplasm of ovary: Secondary | ICD-10-CM

## 2018-03-20 DIAGNOSIS — Z7689 Persons encountering health services in other specified circumstances: Secondary | ICD-10-CM | POA: Diagnosis not present

## 2018-03-20 DIAGNOSIS — Z5111 Encounter for antineoplastic chemotherapy: Secondary | ICD-10-CM | POA: Diagnosis not present

## 2018-03-20 DIAGNOSIS — R011 Cardiac murmur, unspecified: Secondary | ICD-10-CM

## 2018-03-20 DIAGNOSIS — R531 Weakness: Secondary | ICD-10-CM

## 2018-03-20 DIAGNOSIS — Z170 Estrogen receptor positive status [ER+]: Secondary | ICD-10-CM | POA: Diagnosis not present

## 2018-03-20 DIAGNOSIS — Z79811 Long term (current) use of aromatase inhibitors: Secondary | ICD-10-CM | POA: Diagnosis not present

## 2018-03-20 DIAGNOSIS — Z85828 Personal history of other malignant neoplasm of skin: Secondary | ICD-10-CM

## 2018-03-20 DIAGNOSIS — Z801 Family history of malignant neoplasm of trachea, bronchus and lung: Secondary | ICD-10-CM

## 2018-03-20 LAB — COMPREHENSIVE METABOLIC PANEL
ALBUMIN: 3.8 g/dL (ref 3.5–5.0)
ALK PHOS: 74 U/L (ref 38–126)
ALT: 22 U/L (ref 14–54)
AST: 30 U/L (ref 15–41)
Anion gap: 7 (ref 5–15)
BILIRUBIN TOTAL: 0.8 mg/dL (ref 0.3–1.2)
BUN: 12 mg/dL (ref 6–20)
CALCIUM: 9.5 mg/dL (ref 8.9–10.3)
CO2: 28 mmol/L (ref 22–32)
CREATININE: 0.53 mg/dL (ref 0.44–1.00)
Chloride: 105 mmol/L (ref 101–111)
GFR calc Af Amer: >60 mL/min (ref >60)
GFR calc non Af Amer: >60 mL/min (ref >60)
GLUCOSE: 127 mg/dL — AB (ref 65–99)
POTASSIUM: 3.5 mmol/L (ref 3.5–5.1)
Sodium: 140 mmol/L (ref 135–145)
Total Protein: 6.4 g/dL — ABNORMAL LOW (ref 6.5–8.1)

## 2018-03-20 LAB — CBC WITH DIFFERENTIAL/PLATELET
BASOS ABS: 0 10*3/uL (ref 0–0.1)
BASOS PCT: 1 %
EOS ABS: 0 10*3/uL (ref 0–0.7)
Eosinophils Relative: 1 %
HEMATOCRIT: 33.7 % — AB (ref 35.0–47.0)
HEMOGLOBIN: 11.7 g/dL — AB (ref 12.0–16.0)
Lymphocytes Relative: 10 %
Lymphs Abs: 0.5 10*3/uL — ABNORMAL LOW (ref 1.0–3.6)
MCH: 30.9 pg (ref 26.0–34.0)
MCHC: 34.6 g/dL (ref 32.0–36.0)
MCV: 89.3 fL (ref 80.0–100.0)
MONOS PCT: 12 %
Monocytes Absolute: 0.6 10*3/uL (ref 0.2–0.9)
NEUTROS ABS: 3.9 10*3/uL (ref 1.4–6.5)
NEUTROS PCT: 76 %
Platelets: 166 10*3/uL (ref 150–440)
RBC: 3.78 MIL/uL — ABNORMAL LOW (ref 3.80–5.20)
RDW: 18.6 % — ABNORMAL HIGH (ref 11.5–14.5)
WBC: 5 10*3/uL (ref 3.6–11.0)

## 2018-03-20 MED ORDER — HEPARIN SOD (PORK) LOCK FLUSH 100 UNIT/ML IV SOLN
500.0000 [IU] | Freq: Once | INTRAVENOUS | Status: AC
  Administered 2018-03-20: 500 [IU] via INTRAVENOUS
  Filled 2018-03-20: qty 5

## 2018-03-20 NOTE — Progress Notes (Signed)
No new change noted today 

## 2018-03-23 NOTE — Progress Notes (Signed)
Hematology/Oncology Consult note [facility]  Telephone:([phone] [phone]  Patient Care Team: Virginia Crews, MD as PCP - General (Family Medicine) Margarita Rana, MD as Referring Physician (Family Medicine) Bary Castilla Forest Gleason, MD (General Surgery)   Name of the patient: Victoria Benson  [id number]  1956-09-14   Date of visit: 03/23/18  Diagnosis- Stage II E DLBCL. ABCsubtype. Not double hit. S/p 3 cycles of RCHOP   Chief complaint/ Reason for visit- discuss PET/CT results  Heme/Onc history: patient is a 62 year old female who developed symptoms of upper respiratory infection and nasal congestion sometime in November 2018. Around the same time she also developed a right neck mass which was initially attributed to possible tonsillitis and she was given antibiotics for the same. However her right neck mass did not decrease in size and she eventually saw Dr. Pryor Ochoa from ENT. Patient was found to have significant right cervical adenopathy and underwent CT of the soft tissue neck which showed right level 2 and 3 adenopathy favoring metastatic carcinoma. Jugulodigastric node measuring up to 3.7 cm. Asymmetric fullness of the right tonsillar fossa possibly primary site.  Patient underwent core biopsy of the right cervical lymph node.Pathology showed diffuse large B-cell lymphoma of the ABC subtype. IHC was positive for BCL-2 and BCL 6 see Mick IHC was not tested due to insufficiency of the testing sample. Ki-67 was high 80-90%.  Patient reports feeling well prior to November 2018. Currently she denies any fevers, chills, unintentional weight loss or drenching night sweats. She reports some difficulty swallowing because of the right neck mass but denies any shortness of breath. She did receive 1 dose of 100 mg prednisone yesterday and reports that her mass feels smaller and swallowing is easier.  PET/CT scan showed hypermetabolic them in  the tonsillar fossa bilaterally right greater than left as well as hypermetabolic lymphadenopathy in the right neck at the level 2 and level 3. No evidence of contralateral cervical adenopathy and no evidence of metastatic disease elsewhere  Bone marrow biopsy showed no evidence of lymphoma. IPI score 0- low risk. Does not meet criteria for CNS prophylaxis  Patient finished 3 cycles of RCHOP on 02/27/18. Plan is to proceed with IFRT  Interval history- she is doing well. Has mild fatigue. Denies any other complaints. Appetite is good. No difficulty swallowing.   ECOG PS- 0 Pain scale- 0   Review of systems- Review of Systems  Constitutional: Positive for malaise/fatigue. Negative for chills, fever and weight loss.  HENT: Negative for congestion, ear discharge and nosebleeds.   Eyes: Negative for blurred vision.  Respiratory: Negative for cough, hemoptysis, sputum production, shortness of breath and wheezing.   Cardiovascular: Negative for chest pain, palpitations, orthopnea and claudication.  Gastrointestinal: Negative for abdominal pain, blood in stool, constipation, diarrhea, heartburn, melena, nausea and vomiting.  Genitourinary: Negative for dysuria, flank pain, frequency, hematuria and urgency.  Musculoskeletal: Negative for back pain, joint pain and myalgias.  Skin: Negative for rash.  Neurological: Negative for dizziness, tingling, focal weakness, seizures, weakness and headaches.  Endo/Heme/Allergies: Does not bruise/bleed easily.  Psychiatric/Behavioral: Negative for depression and suicidal ideas. The patient does not have insomnia.      No Known Allergies   Past Medical History:  Diagnosis Date  . BRCA gene mutation negative 07/10/2016  . Breast cancer ([place]) 2009   T1c (1.2 cm) N0 ER 90%, PR 90%, HER-2/neu not amplified. Oncotype DX score, low risk (14) 9% chance of recurrence with antiestrogen therapy  alone.  . Breast cancer ([place]) 06-17-16   INVASIVE MAMMARY CARCINOMA .  T1, ER positive, PR positive, HER-2/neu not overexpressed  . Family history of adverse reaction to anesthesia    PTS MOM-HARD TO WAKE UP  . Heart murmur   . Radiation 2009   BREAST CA  . Skin cancer      Past Surgical History:  Procedure Laterality Date  . BREAST BIOPSY Right 06/17/2016   invasive. T1, ER positive, PR positive, HER-2/neu not overexpressed  . BREAST CYST ASPIRATION Left 2002  . BREAST EXCISIONAL BIOPSY Right 06/2016   lumpectomy with rad   . BREAST EXCISIONAL BIOPSY Right 2009   breast ca +  . BREAST LUMPECTOMY WITH NEEDLE LOCALIZATION Right 07/18/2016   Procedure: BREAST LUMPECTOMY WITH NEEDLE LOCALIZATION AND RIGHT AXILLARY EXPLORATION;  Surgeon: Robert Bellow, MD;  Location: [facility] ORS;  Service: General;  Laterality: Right;  . BREAST SURGERY Right July 2009   Wide excision, sentinel left node biopsy MammoSite. ( 06/21/2008  . CHOLECYSTECTOMY  1984  . DILATION AND CURETTAGE OF UTERUS  2004  . ENDOMETRIAL ABLATION  2005  . IR FLUORO GUIDE PORT INSERTION RIGHT  01/15/2018  . TUMOR REMOVAL  12/2013   from uterus done by [facility]    Social History   Socioeconomic History  . Marital status: Married    Spouse name: [name]  . Number of children: 2  . Years of education: 68  . Highest education level: Not on file  Occupational History  . Occupation: unemployed  Social Needs  . Financial resource strain: Not on file  . Food insecurity:    Worry: Not on file    Inability: Not on file  . Transportation needs:    Medical: Not on file    Non-medical: Not on file  Tobacco Use  . Smoking status: Never Smoker  . Smokeless tobacco: Never Used  Substance and Sexual Activity  . Alcohol use: No  . Drug use: No  . Sexual activity: Yes  Lifestyle  . Physical activity:    Days per week: Not on file    Minutes per session: Not on file  . Stress: Not on file  Relationships  . Social connections:    Talks on phone: Not on file    Gets together: Not on file     Attends religious service: Not on file    Active member of club or organization: Not on file    Attends meetings of clubs or organizations: Not on file    Relationship status: Not on file  . Intimate partner violence:    Fear of current or ex partner: Not on file    Emotionally abused: Not on file    Physically abused: Not on file    Forced sexual activity: Not on file  Other Topics Concern  . Not on file  Social History Narrative  . Not on file    Family History  Problem Relation Age of Onset  . CAD Mother   . Osteoporosis Mother   . Lung cancer Father   . Diabetes Father   . Hypertension Brother   . Ovarian cancer Maternal Grandmother   . Heart attack Paternal Grandmother   . Diabetes Paternal Grandfather   . Fibromyalgia Brother   . Hypertension Brother   . Hypertension Brother   . Sleep apnea Brother   . Healthy Brother   . Sleep apnea Brother   . Ovarian cancer Other   . Breast cancer Neg  Hx      Current Outpatient Medications:  .  CALCIUM-MAGNESIUM-VITAMIN D ER PO, Take 1 tablet by mouth daily., Disp: , Rfl:  .  cetirizine (ZYRTEC) 10 MG tablet, Take 10 mg daily by mouth., Disp: , Rfl:  .  fluticasone (FLONASE) 50 MCG/ACT nasal spray, Place 2 sprays daily into both nostrils., Disp: 16 g, Rfl: 6 .  lidocaine-prilocaine (EMLA) cream, Apply to affected area once, Disp: 30 g, Rfl: 3 .  Multiple Vitamin (MULTIVITAMIN) capsule, Take 1 capsule by mouth daily., Disp: , Rfl:  .  Omega-3 Krill Oil 500 MG CAPS, Take 1,000 mg by mouth daily. , Disp: , Rfl:  .  allopurinol (ZYLOPRIM) 300 MG tablet, Take 300 mg by mouth daily., Disp: , Rfl:  .  anastrozole (ARIMIDEX) 1 MG tablet, TAKE ONE TABLET BY MOUTH EVERY DAY, Disp: 90 tablet, Rfl: 3 .  LORazepam (ATIVAN) 0.5 MG tablet, Take 1 tablet (0.5 mg total) by mouth every 6 (six) hours as needed (nausea and vomiting). (Patient not taking: Reported on 02/27/2018), Disp: 30 tablet, Rfl: 0 .  ondansetron (ZOFRAN) 8 MG tablet, Take 1  tablet (8 mg total) by mouth 2 (two) times daily as needed for nausea or vomiting. Start on day 3 after your treatment of cyclophosphamide chemotherapy (Patient not taking: Reported on 03/20/2018), Disp: 20 tablet, Rfl: 1 .  predniSONE (DELTASONE) 50 MG tablet, Take 2 tablets (100 mg total) by mouth daily with breakfast. Take on day 1-5 of chemotherapy  With each treatment cycle (Patient not taking: Reported on 03/20/2018), Disp: 40 tablet, Rfl: 0 .  prochlorperazine (COMPAZINE) 10 MG tablet, Take 1 tablet (10 mg total) by mouth every 6 (six) hours as needed for nausea or vomiting. (Patient not taking: Reported on 02/27/2018), Disp: 30 tablet, Rfl: 3  Physical exam:  Vitals:   03/20/18 0831  BP: 111/73  Pulse: 90  Resp: 18  Temp: 98.7 F (37.1 C)  TempSrc: Tympanic  Weight: 187 lb 12.8 oz (85.2 kg)  Height: 5' 3.9" (1.623 m)   Physical Exam  Constitutional: She is oriented to person, place, and time and well-developed, well-nourished, and in no distress.  HENT:  Head: Normocephalic and atraumatic.  Eyes: Pupils are equal, round, and reactive to light. EOM are normal.  Neck: Normal range of motion.  Cardiovascular: Normal rate, regular rhythm and normal heart sounds.  Pulmonary/Chest: Effort normal and breath sounds normal.  Abdominal: Soft. Bowel sounds are normal.  Lymphadenopathy:  No palpable cervical adenopathy  Neurological: She is alert and oriented to person, place, and time.  Skin: Skin is warm and dry.     CMP Latest Ref Rng & Units 03/20/2018  Glucose 65 - 99 mg/dL 127(H)  BUN 6 - 20 mg/dL 12  Creatinine 0.44 - 1.00 mg/dL 0.53  Sodium 135 - 145 mmol/L 140  Potassium 3.5 - 5.1 mmol/L 3.5  Chloride 101 - 111 mmol/L 105  CO2 22 - 32 mmol/L 28  Calcium 8.9 - 10.3 mg/dL 9.5  Total Protein 6.5 - 8.1 g/dL 6.4(L)  Total Bilirubin 0.3 - 1.2 mg/dL 0.8  Alkaline Phos 38 - 126 U/L 74  AST 15 - 41 U/L 30  ALT 14 - 54 U/L 22   CBC Latest Ref Rng & Units 03/20/2018  WBC 3.6 - 11.0  K/uL 5.0  Hemoglobin 12.0 - 16.0 g/dL 11.7(L)  Hematocrit 35.0 - 47.0 % 33.7(L)  Platelets 150 - 440 K/uL 166    No images are attached to the encounter.  Nm Pet Image Restag (ps) Skull Base To Thigh  Result Date: 03/19/2018 CLINICAL DATA:  Subsequent treatment strategy for diffuse large B-cell lymphoma. EXAM: NUCLEAR MEDICINE PET SKULL BASE TO THIGH TECHNIQUE: 9.96 mCi F-18 FDG was injected intravenously. Full-ring PET imaging was performed from the skull base to thigh after the radiotracer. CT data was obtained and used for attenuation correction and anatomic localization. Fasting blood glucose: 92 mg/dl Mediastinal blood pool activity: SUV max 2.4 COMPARISON:  PET-CT 01/12/2018 and neck CT 12/15/2017. FINDINGS: NECK: There are no residual enlarged or hypermetabolic cervical lymph nodes. There are no focal lesions of the pharyngeal mucosal space. Activity within [facility] ring is now symmetric and within physiologic limits (SUV max 5.0). Incidental CT findings: Stable 2 cm low-density right thyroid nodule on image 53, without hypermetabolic activity. CHEST: There are no hypermetabolic mediastinal, hilar or axillary lymph nodes. There is no hypermetabolic pulmonary activity. Incidental CT findings: Right IJ Port-A-Cath extends to the inferior right atrium. There are new diffuse ground-glass opacities throughout both lungs. No focal airspace disease or suspicious pulmonary nodule. ABDOMEN/PELVIS: There is no hypermetabolic activity within the liver, adrenal glands, spleen or pancreas. There is no hypermetabolic nodal activity. Hepatic activity: SUV max 4.0. Incidental CT findings: Posterior uterine fibroid and mild aortic atherosclerosis noted. SKELETON: There is no hypermetabolic activity to suggest osseous metastatic disease. Incidental CT findings: none IMPRESSION: 1. Complete response to therapy with no hypermetabolic or enlarged cervical lymph nodes. Deauville 1. 2. No new areas of hypermetabolism.  3. New ground-glass opacities in the lungs, likely atelectasis, edema or drug reaction. No associated hypermetabolic activity. 4. Tip of the right IJ Port-A-Cath extends to the inferior aspect of the right atrium. This may be related to imaging in the supine position. Suggest radiographic correlation for catheter position and the pulmonary findings. Electronically Signed   By: Richardean Sale M.D.   On: 03/19/2018 16:34     Assessment and plan- Patient is a 62 y.o. female with Stage II E DLBCL abc subtypenot double hit. Low risk IPI score 0 s/p 3 ycles of RCHOP chemotherapy  Clinically she is doing well. There is no palpable adenopathy. I have personally reviewed PET/CT images independently and I have discussed findings with the patient. She has no enlarged LN and no areas of hypermetabolism noted on PET. Deauville 1. She is in remission from her lymphoma standpoint and can proceed with IFRT at this time.  We will plan for port removal after RT is completed   I will see her back in 3 months with cbc, cmp and LDH. She will also f/u with ENT for NPL exam following RT     Visit Diagnosis 1. Diffuse large B-cell lymphoma of lymph nodes of neck ([facility])      Dr. Randa Evens, MD, MPH [facility] at [facility] Pager- [phone] 03/23/2018 8:15 AM

## 2018-03-31 ENCOUNTER — Other Ambulatory Visit: Payer: Self-pay

## 2018-03-31 ENCOUNTER — Ambulatory Visit
Admission: RE | Admit: 2018-03-31 | Discharge: 2018-03-31 | Disposition: A | Discharge status: Home / Self Care | Payer: 59 | Source: Ambulatory Visit | Attending: Radiation Oncology | Admitting: Radiation Oncology

## 2018-03-31 ENCOUNTER — Encounter: Payer: Self-pay | Admitting: Radiation Oncology

## 2018-03-31 VITALS — BP 122/70 | HR 66 | Temp 96.5°F | Resp 20 | Wt 190.0 lb

## 2018-03-31 DIAGNOSIS — Z9221 Personal history of antineoplastic chemotherapy: Secondary | ICD-10-CM | POA: Insufficient documentation

## 2018-03-31 DIAGNOSIS — C8331 Diffuse large B-cell lymphoma, lymph nodes of head, face, and neck: Secondary | ICD-10-CM | POA: Diagnosis present

## 2018-03-31 NOTE — Progress Notes (Signed)
Radiation Oncology Follow up Note  Name: Victoria Benson   Date:   03/31/2018 MRN:  [id number] DOB: 04-02-1956    This 62 y.o. female presents to the clinic today for follow-up status post completion of.3 cycles of R CHOP chemotherapy for stage II E diffuse large B-cell lymphoma of the right neck  REFERRING PROVIDER: Virginia Crews, MD  HPI: patient is a 62 year old female now having completed R CHOP chemotherapy for diffuse large B-cell lymphoma of the right neck. She's had a PET CT scan which shows complete response by PET CT criteria she is otherwise doing well she does have some night sweats specifically denies fever chillsor weight loss. She is swallowing well no dysphagia. I had originally consult with her about involved field radiation therapy for which she is seen today..  COMPLICATIONS OF TREATMENT: none  FOLLOW UP COMPLIANCE: keeps appointments   PHYSICAL EXAM:  BP 122/70   Pulse 66   Temp (!) 96.5 F (35.8 C)   Resp 20   Wt 190 lb 0.6 oz (86.2 kg)   BMI 32.72 kg/m  Oral cavity is clear no oral mucosal lesions are identified. Indirect mirror examination shows upper airway clear vallecula and base of tongue within normal limits. Neck is clear without evidence of subject gastric cervical or supraclavicular adenopathy. No other peripheral adenopathy is identified. Well-developed well-nourished patient in NAD. HEENT reveals PERLA, EOMI, discs not visualized.  Oral cavity is clear. No oral mucosal lesions are identified. Neck is clear without evidence of cervical or supraclavicular adenopathy. Lungs are clear to A&P. Cardiac examination is essentially unremarkable with regular rate and rhythm without murmur rub or thrill. Abdomen is benign with no organomegaly or masses noted. Motor sensory and DTR levels are equal and symmetric in the upper and lower extremities. Cranial nerves II through XII are grossly intact. Proprioception is intact. No peripheral adenopathy or edema is  identified. No motor or sensory levels are noted. Crude visual fields are within normal range.  RADIOLOGY RESULTS: PET/CT scans are reviewed and compatible with the above-stated findings  PLAN: this time I to go ahead with involved field radiation therapy to her right neck. Would plan on delivering 3000 cGy in 3 weeks. Would use I MRT treatment planning and delivery to spare critical structures such as her spinal cord salivary glands. Risks and benefits of treatment including loss of taste skin reaction fatigue possible radiation esophagitis and dysphasia and alteration of blood counts all were discussed in detail with the patient. I personally set up and ordered CT simulation in about a week's time.  I would like to take this opportunity to thank you for allowing me to participate in the care of your patient.Noreene Filbert, MD

## 2018-04-09 ENCOUNTER — Other Ambulatory Visit: Payer: Self-pay

## 2018-04-09 DIAGNOSIS — C50211 Malignant neoplasm of upper-inner quadrant of right female breast: Secondary | ICD-10-CM

## 2018-04-09 DIAGNOSIS — Z170 Estrogen receptor positive status [ER+]: Principal | ICD-10-CM

## 2018-04-10 ENCOUNTER — Ambulatory Visit
Admission: RE | Admit: 2018-04-10 | Discharge: 2018-04-10 | Disposition: A | Discharge status: Home / Self Care | Payer: 59 | Source: Ambulatory Visit | Attending: Radiation Oncology | Admitting: Radiation Oncology

## 2018-04-10 ENCOUNTER — Encounter: Payer: Self-pay | Admitting: Interventional Radiology

## 2018-04-10 DIAGNOSIS — Z170 Estrogen receptor positive status [ER+]: Secondary | ICD-10-CM | POA: Insufficient documentation

## 2018-04-10 DIAGNOSIS — Z510 Encounter for antineoplastic radiation therapy: Secondary | ICD-10-CM | POA: Diagnosis not present

## 2018-04-10 DIAGNOSIS — C8331 Diffuse large B-cell lymphoma, lymph nodes of head, face, and neck: Secondary | ICD-10-CM | POA: Insufficient documentation

## 2018-04-14 DIAGNOSIS — C8331 Diffuse large B-cell lymphoma, lymph nodes of head, face, and neck: Secondary | ICD-10-CM | POA: Diagnosis not present

## 2018-04-17 ENCOUNTER — Other Ambulatory Visit: Payer: Self-pay | Admitting: *Deleted

## 2018-04-17 DIAGNOSIS — C8331 Diffuse large B-cell lymphoma, lymph nodes of head, face, and neck: Secondary | ICD-10-CM

## 2018-04-21 ENCOUNTER — Ambulatory Visit
Admission: RE | Admit: 2018-04-21 | Discharge: 2018-04-21 | Disposition: A | Discharge status: Home / Self Care | Payer: 59 | Source: Ambulatory Visit | Attending: Radiation Oncology | Admitting: Radiation Oncology

## 2018-04-22 ENCOUNTER — Ambulatory Visit
Admission: RE | Admit: 2018-04-22 | Discharge: 2018-04-22 | Disposition: A | Discharge status: Home / Self Care | Payer: 59 | Source: Ambulatory Visit | Attending: Radiation Oncology | Admitting: Radiation Oncology

## 2018-04-22 DIAGNOSIS — C8331 Diffuse large B-cell lymphoma, lymph nodes of head, face, and neck: Secondary | ICD-10-CM | POA: Diagnosis not present

## 2018-04-23 ENCOUNTER — Ambulatory Visit
Admission: RE | Admit: 2018-04-23 | Discharge: 2018-04-23 | Disposition: A | Discharge status: Home / Self Care | Payer: 59 | Source: Ambulatory Visit | Attending: Radiation Oncology | Admitting: Radiation Oncology

## 2018-04-23 ENCOUNTER — Ambulatory Visit: Payer: 59

## 2018-04-23 DIAGNOSIS — C8331 Diffuse large B-cell lymphoma, lymph nodes of head, face, and neck: Secondary | ICD-10-CM | POA: Diagnosis not present

## 2018-04-24 ENCOUNTER — Ambulatory Visit
Admission: RE | Admit: 2018-04-24 | Discharge: 2018-04-24 | Disposition: A | Discharge status: Home / Self Care | Payer: 59 | Source: Ambulatory Visit | Attending: Radiation Oncology | Admitting: Radiation Oncology

## 2018-04-24 ENCOUNTER — Ambulatory Visit: Payer: 59

## 2018-04-24 DIAGNOSIS — C8331 Diffuse large B-cell lymphoma, lymph nodes of head, face, and neck: Secondary | ICD-10-CM | POA: Diagnosis not present

## 2018-04-27 ENCOUNTER — Ambulatory Visit
Admission: RE | Admit: 2018-04-27 | Discharge: 2018-04-27 | Disposition: A | Discharge status: Home / Self Care | Payer: 59 | Source: Ambulatory Visit | Attending: Radiation Oncology | Admitting: Radiation Oncology

## 2018-04-27 ENCOUNTER — Ambulatory Visit: Payer: 59

## 2018-04-27 DIAGNOSIS — C8331 Diffuse large B-cell lymphoma, lymph nodes of head, face, and neck: Secondary | ICD-10-CM | POA: Diagnosis not present

## 2018-04-28 ENCOUNTER — Ambulatory Visit: Payer: 59

## 2018-04-28 ENCOUNTER — Ambulatory Visit
Admission: RE | Admit: 2018-04-28 | Discharge: 2018-04-28 | Disposition: A | Discharge status: Home / Self Care | Payer: 59 | Source: Ambulatory Visit | Attending: Radiation Oncology | Admitting: Radiation Oncology

## 2018-04-28 ENCOUNTER — Other Ambulatory Visit: Payer: Self-pay | Admitting: *Deleted

## 2018-04-28 DIAGNOSIS — C8331 Diffuse large B-cell lymphoma, lymph nodes of head, face, and neck: Secondary | ICD-10-CM | POA: Diagnosis not present

## 2018-04-28 MED ORDER — SUCRALFATE 1 G PO TABS: 1.0000 g | ORAL_TABLET | Freq: Three times a day (TID) | ORAL | 3 refills | Status: DC

## 2018-04-29 ENCOUNTER — Other Ambulatory Visit: Payer: Self-pay

## 2018-04-29 ENCOUNTER — Ambulatory Visit: Payer: 59

## 2018-04-29 ENCOUNTER — Ambulatory Visit
Admission: RE | Admit: 2018-04-29 | Discharge: 2018-04-29 | Disposition: A | Discharge status: Home / Self Care | Payer: 59 | Source: Ambulatory Visit | Attending: Radiation Oncology | Admitting: Radiation Oncology

## 2018-04-29 ENCOUNTER — Inpatient Hospital Stay: Payer: 59 | Attending: Radiation Oncology

## 2018-04-29 DIAGNOSIS — Z8041 Family history of malignant neoplasm of ovary: Secondary | ICD-10-CM | POA: Insufficient documentation

## 2018-04-29 DIAGNOSIS — Z510 Encounter for antineoplastic radiation therapy: Secondary | ICD-10-CM | POA: Insufficient documentation

## 2018-04-29 DIAGNOSIS — R011 Cardiac murmur, unspecified: Secondary | ICD-10-CM | POA: Insufficient documentation

## 2018-04-29 DIAGNOSIS — Z9221 Personal history of antineoplastic chemotherapy: Secondary | ICD-10-CM | POA: Diagnosis not present

## 2018-04-29 DIAGNOSIS — R05 Cough: Secondary | ICD-10-CM | POA: Insufficient documentation

## 2018-04-29 DIAGNOSIS — E860 Dehydration: Secondary | ICD-10-CM | POA: Insufficient documentation

## 2018-04-29 DIAGNOSIS — Z801 Family history of malignant neoplasm of trachea, bronchus and lung: Secondary | ICD-10-CM | POA: Diagnosis not present

## 2018-04-29 DIAGNOSIS — C8331 Diffuse large B-cell lymphoma, lymph nodes of head, face, and neck: Secondary | ICD-10-CM | POA: Insufficient documentation

## 2018-04-29 DIAGNOSIS — Z170 Estrogen receptor positive status [ER+]: Secondary | ICD-10-CM | POA: Diagnosis not present

## 2018-04-29 DIAGNOSIS — C8338 Diffuse large B-cell lymphoma, lymph nodes of multiple sites: Secondary | ICD-10-CM | POA: Diagnosis not present

## 2018-04-29 DIAGNOSIS — R509 Fever, unspecified: Secondary | ICD-10-CM | POA: Insufficient documentation

## 2018-04-29 DIAGNOSIS — B370 Candidal stomatitis: Secondary | ICD-10-CM | POA: Insufficient documentation

## 2018-04-29 DIAGNOSIS — C50211 Malignant neoplasm of upper-inner quadrant of right female breast: Secondary | ICD-10-CM | POA: Diagnosis not present

## 2018-04-29 DIAGNOSIS — R634 Abnormal weight loss: Secondary | ICD-10-CM | POA: Insufficient documentation

## 2018-04-29 DIAGNOSIS — Z85828 Personal history of other malignant neoplasm of skin: Secondary | ICD-10-CM | POA: Insufficient documentation

## 2018-04-29 DIAGNOSIS — Z923 Personal history of irradiation: Secondary | ICD-10-CM | POA: Insufficient documentation

## 2018-04-29 DIAGNOSIS — E876 Hypokalemia: Secondary | ICD-10-CM | POA: Insufficient documentation

## 2018-04-29 DIAGNOSIS — Z853 Personal history of malignant neoplasm of breast: Secondary | ICD-10-CM | POA: Diagnosis not present

## 2018-04-29 DIAGNOSIS — Z79811 Long term (current) use of aromatase inhibitors: Secondary | ICD-10-CM | POA: Insufficient documentation

## 2018-04-29 DIAGNOSIS — R110 Nausea: Secondary | ICD-10-CM | POA: Diagnosis not present

## 2018-04-29 DIAGNOSIS — Z79899 Other long term (current) drug therapy: Secondary | ICD-10-CM | POA: Insufficient documentation

## 2018-04-29 LAB — CBC
HCT: 39 % (ref 35.0–47.0)
Hemoglobin: 13.5 g/dL (ref 12.0–16.0)
MCH: 30.6 pg (ref 26.0–34.0)
MCHC: 34.6 g/dL (ref 32.0–36.0)
MCV: 88.3 fL (ref 80.0–100.0)
PLATELETS: 196 10*3/uL (ref 150–440)
RBC: 4.41 MIL/uL (ref 3.80–5.20)
RDW: 13.3 % (ref 11.5–14.5)
WBC: 5.2 10*3/uL (ref 3.6–11.0)

## 2018-04-30 ENCOUNTER — Ambulatory Visit
Admission: RE | Admit: 2018-04-30 | Discharge: 2018-04-30 | Disposition: A | Discharge status: Home / Self Care | Payer: 59 | Source: Ambulatory Visit | Attending: Radiation Oncology | Admitting: Radiation Oncology

## 2018-04-30 ENCOUNTER — Ambulatory Visit: Payer: 59

## 2018-04-30 DIAGNOSIS — C8331 Diffuse large B-cell lymphoma, lymph nodes of head, face, and neck: Secondary | ICD-10-CM | POA: Diagnosis not present

## 2018-05-01 ENCOUNTER — Ambulatory Visit: Payer: 59

## 2018-05-01 ENCOUNTER — Ambulatory Visit
Admission: RE | Admit: 2018-05-01 | Discharge: 2018-05-01 | Disposition: A | Discharge status: Home / Self Care | Payer: 59 | Source: Ambulatory Visit | Attending: Radiation Oncology | Admitting: Radiation Oncology

## 2018-05-01 DIAGNOSIS — C8331 Diffuse large B-cell lymphoma, lymph nodes of head, face, and neck: Secondary | ICD-10-CM | POA: Diagnosis not present

## 2018-05-04 ENCOUNTER — Ambulatory Visit: Payer: 59

## 2018-05-04 ENCOUNTER — Ambulatory Visit
Admission: RE | Admit: 2018-05-04 | Discharge: 2018-05-04 | Disposition: A | Discharge status: Home / Self Care | Payer: 59 | Source: Ambulatory Visit | Attending: Radiation Oncology | Admitting: Radiation Oncology

## 2018-05-04 DIAGNOSIS — C8331 Diffuse large B-cell lymphoma, lymph nodes of head, face, and neck: Secondary | ICD-10-CM | POA: Diagnosis not present

## 2018-05-05 ENCOUNTER — Ambulatory Visit
Admission: RE | Admit: 2018-05-05 | Discharge: 2018-05-05 | Disposition: A | Discharge status: Home / Self Care | Payer: 59 | Source: Ambulatory Visit | Attending: Radiation Oncology | Admitting: Radiation Oncology

## 2018-05-05 ENCOUNTER — Ambulatory Visit: Payer: 59

## 2018-05-05 DIAGNOSIS — C8331 Diffuse large B-cell lymphoma, lymph nodes of head, face, and neck: Secondary | ICD-10-CM | POA: Diagnosis not present

## 2018-05-06 ENCOUNTER — Ambulatory Visit: Payer: 59

## 2018-05-06 ENCOUNTER — Inpatient Hospital Stay: Payer: 59

## 2018-05-06 ENCOUNTER — Ambulatory Visit
Admission: RE | Admit: 2018-05-06 | Discharge: 2018-05-06 | Disposition: A | Discharge status: Home / Self Care | Payer: 59 | Source: Ambulatory Visit | Attending: Radiation Oncology | Admitting: Radiation Oncology

## 2018-05-06 DIAGNOSIS — C8331 Diffuse large B-cell lymphoma, lymph nodes of head, face, and neck: Secondary | ICD-10-CM | POA: Diagnosis not present

## 2018-05-06 DIAGNOSIS — E860 Dehydration: Secondary | ICD-10-CM | POA: Diagnosis not present

## 2018-05-06 LAB — CBC
HEMATOCRIT: 41.5 % (ref 35.0–47.0)
Hemoglobin: 14.3 g/dL (ref 12.0–16.0)
MCH: 30.3 pg (ref 26.0–34.0)
MCHC: 34.5 g/dL (ref 32.0–36.0)
MCV: 87.9 fL (ref 80.0–100.0)
PLATELETS: 175 10*3/uL (ref 150–440)
RBC: 4.72 MIL/uL (ref 3.80–5.20)
RDW: 12.7 % (ref 11.5–14.5)
WBC: 4.1 10*3/uL (ref 3.6–11.0)

## 2018-05-07 ENCOUNTER — Ambulatory Visit: Payer: 59

## 2018-05-08 ENCOUNTER — Ambulatory Visit
Admission: RE | Admit: 2018-05-08 | Discharge: 2018-05-08 | Disposition: A | Discharge status: Home / Self Care | Payer: 59 | Source: Ambulatory Visit | Attending: Oncology | Admitting: Oncology

## 2018-05-08 ENCOUNTER — Inpatient Hospital Stay (HOSPITAL_BASED_OUTPATIENT_CLINIC_OR_DEPARTMENT_OTHER): Payer: 59 | Admitting: Oncology

## 2018-05-08 ENCOUNTER — Telehealth: Payer: Self-pay | Admitting: *Deleted

## 2018-05-08 ENCOUNTER — Inpatient Hospital Stay: Payer: 59

## 2018-05-08 ENCOUNTER — Ambulatory Visit: Payer: 59

## 2018-05-08 VITALS — BP 115/81 | HR 79 | Temp 97.4°F | Resp 18 | Wt 178.9 lb

## 2018-05-08 DIAGNOSIS — E860 Dehydration: Secondary | ICD-10-CM

## 2018-05-08 DIAGNOSIS — R05 Cough: Secondary | ICD-10-CM

## 2018-05-08 DIAGNOSIS — B370 Candidal stomatitis: Secondary | ICD-10-CM

## 2018-05-08 DIAGNOSIS — R509 Fever, unspecified: Secondary | ICD-10-CM

## 2018-05-08 DIAGNOSIS — Z923 Personal history of irradiation: Secondary | ICD-10-CM

## 2018-05-08 DIAGNOSIS — Z79811 Long term (current) use of aromatase inhibitors: Secondary | ICD-10-CM

## 2018-05-08 DIAGNOSIS — Z170 Estrogen receptor positive status [ER+]: Secondary | ICD-10-CM

## 2018-05-08 DIAGNOSIS — Z9221 Personal history of antineoplastic chemotherapy: Secondary | ICD-10-CM

## 2018-05-08 DIAGNOSIS — R059 Cough, unspecified: Secondary | ICD-10-CM

## 2018-05-08 DIAGNOSIS — Z801 Family history of malignant neoplasm of trachea, bronchus and lung: Secondary | ICD-10-CM

## 2018-05-08 DIAGNOSIS — Z79899 Other long term (current) drug therapy: Secondary | ICD-10-CM | POA: Diagnosis not present

## 2018-05-08 DIAGNOSIS — C8338 Diffuse large B-cell lymphoma, lymph nodes of multiple sites: Secondary | ICD-10-CM | POA: Diagnosis not present

## 2018-05-08 DIAGNOSIS — R110 Nausea: Secondary | ICD-10-CM

## 2018-05-08 DIAGNOSIS — R634 Abnormal weight loss: Secondary | ICD-10-CM

## 2018-05-08 DIAGNOSIS — C50211 Malignant neoplasm of upper-inner quadrant of right female breast: Secondary | ICD-10-CM | POA: Diagnosis not present

## 2018-05-08 DIAGNOSIS — Z85828 Personal history of other malignant neoplasm of skin: Secondary | ICD-10-CM

## 2018-05-08 DIAGNOSIS — Z95828 Presence of other vascular implants and grafts: Secondary | ICD-10-CM

## 2018-05-08 DIAGNOSIS — R011 Cardiac murmur, unspecified: Secondary | ICD-10-CM

## 2018-05-08 DIAGNOSIS — Z853 Personal history of malignant neoplasm of breast: Secondary | ICD-10-CM

## 2018-05-08 DIAGNOSIS — R630 Anorexia: Secondary | ICD-10-CM

## 2018-05-08 DIAGNOSIS — K1230 Oral mucositis (ulcerative), unspecified: Secondary | ICD-10-CM

## 2018-05-08 DIAGNOSIS — Z8041 Family history of malignant neoplasm of ovary: Secondary | ICD-10-CM

## 2018-05-08 LAB — CBC WITH DIFFERENTIAL/PLATELET
BASOS PCT: 1 %
Basophils Absolute: 0 10*3/uL (ref 0–0.1)
Eosinophils Absolute: 0.2 10*3/uL (ref 0–0.7)
Eosinophils Relative: 4 %
HEMATOCRIT: 43 % (ref 35.0–47.0)
HEMOGLOBIN: 14.9 g/dL (ref 12.0–16.0)
LYMPHS ABS: 0.5 10*3/uL — AB (ref 1.0–3.6)
LYMPHS PCT: 11 %
MCH: 30.4 pg (ref 26.0–34.0)
MCHC: 34.7 g/dL (ref 32.0–36.0)
MCV: 87.5 fL (ref 80.0–100.0)
MONO ABS: 0.6 10*3/uL (ref 0.2–0.9)
MONOS PCT: 12 %
NEUTROS PCT: 72 %
Neutro Abs: 3.5 10*3/uL (ref 1.4–6.5)
Platelets: 163 10*3/uL (ref 150–440)
RBC: 4.92 MIL/uL (ref 3.80–5.20)
RDW: 12.6 % (ref 11.5–14.5)
WBC: 4.8 10*3/uL (ref 3.6–11.0)

## 2018-05-08 LAB — URINALYSIS, COMPLETE (UACMP) WITH MICROSCOPIC
BACTERIA UA: NONE SEEN
BILIRUBIN URINE: NEGATIVE
Glucose, UA: NEGATIVE mg/dL
Hgb urine dipstick: NEGATIVE
Ketones, ur: 80 mg/dL — AB
Nitrite: NEGATIVE
PH: 5 (ref 5.0–8.0)
Protein, ur: NEGATIVE mg/dL
SPECIFIC GRAVITY, URINE: 1.017 (ref 1.005–1.030)

## 2018-05-08 LAB — COMPREHENSIVE METABOLIC PANEL
ALBUMIN: 4.4 g/dL (ref 3.5–5.0)
ALK PHOS: 98 U/L (ref 38–126)
ALT: 30 U/L (ref 14–54)
ANION GAP: 12 (ref 5–15)
AST: 28 U/L (ref 15–41)
BUN: 7 mg/dL (ref 6–20)
CALCIUM: 9.9 mg/dL (ref 8.9–10.3)
CHLORIDE: 100 mmol/L — AB (ref 101–111)
CO2: 27 mmol/L (ref 22–32)
Creatinine, Ser: 0.72 mg/dL (ref 0.44–1.00)
GFR calc non Af Amer: >60 mL/min (ref >60)
GLUCOSE: 98 mg/dL (ref 65–99)
POTASSIUM: 4.5 mmol/L (ref 3.5–5.1)
Sodium: 139 mmol/L (ref 135–145)
Total Bilirubin: 1.2 mg/dL (ref 0.3–1.2)
Total Protein: 7.4 g/dL (ref 6.5–8.1)

## 2018-05-08 LAB — MAGNESIUM: Magnesium: 2.1 mg/dL (ref 1.7–2.4)

## 2018-05-08 MED ORDER — HEPARIN SOD (PORK) LOCK FLUSH 100 UNIT/ML IV SOLN
500.0000 [IU] | Freq: Once | INTRAVENOUS | Status: AC
  Administered 2018-05-08: 500 [IU] via INTRAVENOUS

## 2018-05-08 MED ORDER — NYSTATIN 100000 UNIT/ML MT SUSP: 5.0000 mL | Freq: Four times a day (QID) | OROMUCOSAL | 0 refills | Status: DC

## 2018-05-08 MED ORDER — SODIUM CHLORIDE 0.9% FLUSH
10.0000 mL | INTRAVENOUS | Status: DC | PRN
  Administered 2018-05-08: 10 mL via INTRAVENOUS
  Filled 2018-05-08: qty 10

## 2018-05-08 MED ORDER — SODIUM CHLORIDE 0.9 % IV SOLN
INTRAVENOUS | Status: DC
  Administered 2018-05-08: 10:00:00 via INTRAVENOUS
  Filled 2018-05-08 (×2): qty 1000

## 2018-05-08 NOTE — Progress Notes (Signed)
Symptom Management Consult note [facility]  Telephone:([phone] [phone]  Patient Care Team: Virginia Crews, MD as PCP - General (Family Medicine) Margarita Rana, MD as Referring Physician (Family Medicine) Bary Castilla Forest Gleason, MD (General Surgery)   Name of the patient: Victoria Benson  [id number]  Apr 19, 1956   Date of visit: 05/08/18  Diagnosis-stage II E DLBCL  Chief complaint/ Reason for visit- Dehydration  Heme/Onc history:  Patient recently seen and evaluated by primary medical oncologist Dr. Janese Banks on 03/20/2017.  She reported feeling well prior to November 2018.  She denied any fevers, chills, unintentional weight loss or or drenching night sweats.  Stated she was having some difficulty swallowing because of the right neck mass but denied any shortness of breath.  Received 1 dose of 100 mg prednisone and reported that her mass was smaller which allowed for easier swallowing.  Admitted to mild fatigue. Recent PET scan showed hypermetabolic in the tonsillar fossa bilateral right greater than left as well as hypermetabolic lymphadenopathy in the right neck.  No other evidence of metastatic disease.  Bone marrow biopsy showed no evidence of lymphoma.   S/p 3 cycles of R-CHOP completed on 02/27/2017.  Plan is to proceed with field radiation therapy to her right neck X 17 treatments.  S/p 11 radiation treatments so far. Canceled Thursday and Friday's treatment due to dehydration, weakness.   Patient with initial symptoms and diagnosis in November 2018.  Saw PCP for symptoms of upper respiratory infection nasal congestion in November 2018 with new right neck mass which was attributed to tonsillitis and she was given an antibiotic.  She was seen by Dr. Pryor Ochoa from ENT for neck mass and found to have significant right cervical adenopathy as well which prompted a CT of the soft tissue neck showing right level 2 and 3 adenopathy favoring metastatic carcinoma.   Biopsy of right cervical lymph node showed large B-cell lymphoma of the ABC subtype.  Interval history-  Victoria Benson presents with low grade fevers, chills and dehydration with weakness, intermittent cough and mouth sores..  She has had the fever for 1 day.  Cough for 2 or more and has been unable to eat or drink for 1 day.  Husband states she is only had water to drink for the past 24 hours because she is unable to swallow foods and "gags". Thinks she may have mouth sores. Symptoms have been gradually worsening and she is becoming weaker. Symptoms associated with the fever include chills, fatigue, nausea, poor appetite and URI symptoms, and patient denies abdominal pain, diarrhea, urinary tract symptoms and vomiting. She has lost 11 lbs. Symptoms are worse at no particular time of day.   Symptoms have been gradually worsening since that time.  Patient has been sleeping well.  Appetite has been worse. Urine output has been stable.  She has tried to alleviate the symptoms with acetaminophen and rest with no relief.    The patient has cancer, immunocompromised state of radiation.  Exposure to someone else at home w/similar symptoms:no  Exposure to someone else at daycare/school/work:no.    ECOG FS:1 - Symptomatic but completely ambulatory  Review of systems- Review of Systems  Constitutional: Positive for fever (Low-grade as high as 100.3), malaise/fatigue and weight loss. Negative for chills.  HENT: Negative for congestion and ear pain.        Mouth pain  Eyes: Negative.  Negative for blurred vision and double vision.  Respiratory: Positive for cough (  Intermittent) and sputum production (yellow). Negative for shortness of breath.   Cardiovascular: Negative.  Negative for chest pain, palpitations and leg swelling.  Gastrointestinal: Positive for nausea (Occasional). Negative for abdominal pain, constipation, diarrhea and vomiting.  Genitourinary: Negative for dysuria, frequency and  urgency.  Musculoskeletal: Negative for back pain and falls.  Skin: Negative.  Negative for rash.  Neurological: Positive for weakness. Negative for headaches.  Endo/Heme/Allergies: Negative.  Does not bruise/bleed easily.  Psychiatric/Behavioral: Negative.  Negative for depression. The patient is not nervous/anxious and does not have insomnia.      Current treatment- Radiation S/p 11 treatments. Canceled yesterday and today's appointment due to severe fatigue and weakness.  No Known Allergies   Past Medical History:  Diagnosis Date  . BRCA gene mutation negative 07/10/2016  . Breast cancer ([place]) 2009   T1c (1.2 cm) N0 ER 90%, PR 90%, HER-2/neu not amplified. Oncotype DX score, low risk (14) 9% chance of recurrence with antiestrogen therapy alone.  . Breast cancer ([place]) 06-17-16   INVASIVE MAMMARY CARCINOMA . T1, ER positive, PR positive, HER-2/neu not overexpressed  . Family history of adverse reaction to anesthesia    PTS MOM-HARD TO WAKE UP  . Heart murmur   . Radiation 2009   BREAST CA  . Skin cancer      Past Surgical History:  Procedure Laterality Date  . BREAST BIOPSY Right 06/17/2016   invasive. T1, ER positive, PR positive, HER-2/neu not overexpressed  . BREAST CYST ASPIRATION Left 2002  . BREAST EXCISIONAL BIOPSY Right 06/2016   lumpectomy with rad   . BREAST EXCISIONAL BIOPSY Right 2009   breast ca +  . BREAST LUMPECTOMY WITH NEEDLE LOCALIZATION Right 07/18/2016   Procedure: BREAST LUMPECTOMY WITH NEEDLE LOCALIZATION AND RIGHT AXILLARY EXPLORATION;  Surgeon: Robert Bellow, MD;  Location: [facility] ORS;  Service: General;  Laterality: Right;  . BREAST SURGERY Right July 2009   Wide excision, sentinel left node biopsy MammoSite. ( 06/21/2008  . CHOLECYSTECTOMY  1984  . DILATION AND CURETTAGE OF UTERUS  2004  . ENDOMETRIAL ABLATION  2005  . IR FLUORO GUIDE PORT INSERTION RIGHT  01/15/2018  . TUMOR REMOVAL  12/2013   from uterus done by [facility]    Social  History   Socioeconomic History  . Marital status: Married    Spouse name: [name]  . Number of children: 2  . Years of education: 22  . Highest education level: Not on file  Occupational History  . Occupation: unemployed  Social Needs  . Financial resource strain: Not on file  . Food insecurity:    Worry: Not on file    Inability: Not on file  . Transportation needs:    Medical: Not on file    Non-medical: Not on file  Tobacco Use  . Smoking status: Never Smoker  . Smokeless tobacco: Never Used  Substance and Sexual Activity  . Alcohol use: No  . Drug use: No  . Sexual activity: Yes  Lifestyle  . Physical activity:    Days per week: Not on file    Minutes per session: Not on file  . Stress: Not on file  Relationships  . Social connections:    Talks on phone: Not on file    Gets together: Not on file    Attends religious service: Not on file    Active member of club or organization: Not on file    Attends meetings of clubs or organizations: Not on file  Relationship status: Not on file  . Intimate partner violence:    Fear of current or ex partner: Not on file    Emotionally abused: Not on file    Physically abused: Not on file    Forced sexual activity: Not on file  Other Topics Concern  . Not on file  Social History Narrative  . Not on file    Family History  Problem Relation Age of Onset  . CAD Mother   . Osteoporosis Mother   . Lung cancer Father   . Diabetes Father   . Hypertension Brother   . Ovarian cancer Maternal Grandmother   . Heart attack Paternal Grandmother   . Diabetes Paternal Grandfather   . Fibromyalgia Brother   . Hypertension Brother   . Hypertension Brother   . Sleep apnea Brother   . Healthy Brother   . Sleep apnea Brother   . Ovarian cancer Other   . Breast cancer Neg Hx      Current Outpatient Medications:  .  anastrozole (ARIMIDEX) 1 MG tablet, TAKE ONE TABLET BY MOUTH EVERY DAY, Disp: 90 tablet, Rfl: 3 .   CALCIUM-MAGNESIUM-VITAMIN D ER PO, Take 1 tablet by mouth daily., Disp: , Rfl:  .  fluticasone (FLONASE) 50 MCG/ACT nasal spray, Place 2 sprays daily into both nostrils., Disp: 16 g, Rfl: 6 .  lidocaine-prilocaine (EMLA) cream, Apply to affected area once, Disp: 30 g, Rfl: 3 .  Multiple Vitamin (MULTIVITAMIN) capsule, Take 1 capsule by mouth daily., Disp: , Rfl:  .  Omega-3 Krill Oil 500 MG CAPS, Take 1,000 mg by mouth daily. , Disp: , Rfl:  .  sucralfate (CARAFATE) 1 g tablet, Take 1 tablet (1 g total) by mouth 3 (three) times daily before meals., Disp: 90 tablet, Rfl: 3 .  cetirizine (ZYRTEC) 10 MG tablet, Take 10 mg daily by mouth., Disp: , Rfl:  .  LORazepam (ATIVAN) 0.5 MG tablet, Take 1 tablet (0.5 mg total) by mouth every 6 (six) hours as needed (nausea and vomiting). (Patient not taking: Reported on 02/27/2018), Disp: 30 tablet, Rfl: 0 .  nystatin (MYCOSTATIN) 100000 UNIT/ML suspension, Take 5 mLs (500,000 Units total) by mouth 4 (four) times daily., Disp: 60 mL, Rfl: 0 .  ondansetron (ZOFRAN) 8 MG tablet, Take 1 tablet (8 mg total) by mouth 2 (two) times daily as needed for nausea or vomiting. Start on day 3 after your treatment of cyclophosphamide chemotherapy (Patient not taking: Reported on 03/20/2018), Disp: 20 tablet, Rfl: 1 .  predniSONE (DELTASONE) 50 MG tablet, Take 2 tablets (100 mg total) by mouth daily with breakfast. Take on day 1-5 of chemotherapy  With each treatment cycle (Patient not taking: Reported on 03/20/2018), Disp: 40 tablet, Rfl: 0 .  prochlorperazine (COMPAZINE) 10 MG tablet, Take 1 tablet (10 mg total) by mouth every 6 (six) hours as needed for nausea or vomiting. (Patient not taking: Reported on 02/27/2018), Disp: 30 tablet, Rfl: 3 No current facility-administered medications for this visit.   Facility-Administered Medications Ordered in Other Visits:  .  0.9 %  sodium chloride infusion, , Intravenous, Continuous, Caden Fatica E, NP, Last Rate: 999 mL/hr at 05/08/18  1022 .  sodium chloride flush (NS) 0.9 % injection 10 mL, 10 mL, Intravenous, PRN, Jacquelin Hawking, NP, 10 mL at 05/08/18 1022  Physical exam:  Vitals:   05/08/18 0936 05/08/18 0945  BP: 115/81   Pulse: 79   Resp:  18  Temp: (!) 97.4 F (36.3 C)  TempSrc: Tympanic   SpO2: 97%   Weight: 178 lb 14.4 oz (81.1 kg)    Physical Exam  Constitutional: She is oriented to person, place, and time. Vital signs are normal.  HENT:  Head: Normocephalic and atraumatic.  Mouth/Throat: Mucous membranes are not pale and not dry. Oropharyngeal exudate present.  Oral lesions present on tongue and bilateral sides of mouth.  Eyes: Pupils are equal, round, and reactive to light.  Neck: Normal range of motion.  Cardiovascular: Normal rate, regular rhythm and normal heart sounds.  No murmur heard. Pulmonary/Chest: Effort normal. She has wheezes (Right upper lobe).  Abdominal: Soft. Normal appearance and bowel sounds are normal. She exhibits no distension. There is no tenderness.  Musculoskeletal: Normal range of motion. She exhibits no edema.  Neurological: She is alert and oriented to person, place, and time.  Skin: Skin is warm and dry. No rash noted. There is pallor.  Psychiatric: Judgment normal.     CMP Latest Ref Rng & Units 05/08/2018  Glucose 65 - 99 mg/dL 98  BUN 6 - 20 mg/dL 7  Creatinine 0.44 - 1.00 mg/dL 0.72  Sodium 135 - 145 mmol/L 139  Potassium 3.5 - 5.1 mmol/L 4.5  Chloride 101 - 111 mmol/L 100(L)  CO2 22 - 32 mmol/L 27  Calcium 8.9 - 10.3 mg/dL 9.9  Total Protein 6.5 - 8.1 g/dL 7.4  Total Bilirubin 0.3 - 1.2 mg/dL 1.2  Alkaline Phos 38 - 126 U/L 98  AST 15 - 41 U/L 28  ALT 14 - 54 U/L 30   CBC Latest Ref Rng & Units 05/08/2018  WBC 3.6 - 11.0 K/uL 4.8  Hemoglobin 12.0 - 16.0 g/dL 14.9  Hematocrit 35.0 - 47.0 % 43.0  Platelets 150 - 440 K/uL 163    No images are attached to the encounter.  Dg Chest 2 View  Result Date: 05/08/2018 CLINICAL DATA:  Cough. EXAM: CHEST -  2 VIEW COMPARISON:  Radiographs of February 01, 2014. FINDINGS: The heart size and mediastinal contours are within normal limits. Both lungs are clear. No pneumothorax or pleural effusion is noted. Interval placement of right internal jugular Port-A-Cath with distal tip in expected position of SVC. The visualized skeletal structures are unremarkable. IMPRESSION: No active cardiopulmonary disease. Electronically Signed   By: Marijo Conception, M.D.   On: 05/08/2018 12:28    Assessment and plan- Patient is a 62 y.o. female who presents for low-grade fevers, anorexia/dysphagia, intermittent cough, weakness and mouth sores.  1.  Lymphoma: S/p 3 cycles of R-CHOP completed on 02/27/2017.  Plan  to proceed with field radiation therapy to her right neck X 17 treatments.  S/p 11 radiation treatments. Self canceled Thursday and Friday's treatment due to dehydration and weakness.  She is scheduled to return to clinic on Monday for scheduled radiation and in June to see Dr. Janese Banks for further follow-up.  2.  Dehydration/anorexia: STAT labs (labs surprisingly look okay). Orthostatic vital signs (negative). We will give patient 1 L NaCl in clinic today.  Mucous membranes dry.  Weight down 11 pounds.  Due to significant weight loss and nutritional status, will postpone radiation until Wednesday (05/13/18).  Spoke to Dr. Donella Stade and he is okay with this plan.  3.  Mucositis/stomatitis/thrush: Likely from radiation. RX Nystatin oral solution and RX Magic Mouthwash with lidocaine.  This hopefully will help with sores in the mouth and oral exudate present allowing her to slowly add foods and drink back into her diet.  4.  Cough/wheezing/fever:  Afebrile during visit. Stat chest x-ray (Negative). UA/Urine Culture (Positive for ketones and moderate leukocytes) Possibly from dehydration??.  Will wait for culture.  Currently not experiencing symptoms of a urinary tract infection.  We will keep a close eye on her cough.  Patient  encouraged to call if fever returns and is greater than 100.7.    Visit Diagnosis 1. Dehydration   2. Anorexia   3. Mucositis   4. Cough   5. Fever, unspecified fever cause   6. Malignant neoplasm of upper-inner quadrant of right breast in female, estrogen receptor positive ([place])     Patient expressed understanding and was in agreement with this plan. She also understands that She can call clinic at any time with any questions, concerns, or complaints.   Greater than 50% was spent in counseling and coordination of care with this patient including but not limited to discussion of the relevant topics above (See A&P) including, but not limited to diagnosis and management of acute and chronic medical conditions.     Faythe Casa, AGNP-C [facility]- [phone] Pager- [phone] 05/08/2018 1:34 PM

## 2018-05-08 NOTE — Telephone Encounter (Signed)
Patient's husband left message on triage line that patient has not eaten much in over a week and is only drinking a little bit of water. He is concerned that she is getting dehydrated.   Called patient's husband and told him to bring her in to see NP in symptom management clinic. He agreed.     dhs

## 2018-05-11 ENCOUNTER — Ambulatory Visit: Payer: 59

## 2018-05-12 ENCOUNTER — Ambulatory Visit: Payer: 59

## 2018-05-13 ENCOUNTER — Ambulatory Visit
Admission: RE | Admit: 2018-05-13 | Discharge: 2018-05-13 | Disposition: A | Discharge status: Home / Self Care | Payer: 59 | Source: Ambulatory Visit | Attending: Radiation Oncology | Admitting: Radiation Oncology

## 2018-05-13 DIAGNOSIS — C8331 Diffuse large B-cell lymphoma, lymph nodes of head, face, and neck: Secondary | ICD-10-CM | POA: Diagnosis not present

## 2018-05-14 ENCOUNTER — Ambulatory Visit
Admission: RE | Admit: 2018-05-14 | Discharge: 2018-05-14 | Disposition: A | Discharge status: Home / Self Care | Payer: 59 | Source: Ambulatory Visit | Attending: Radiation Oncology | Admitting: Radiation Oncology

## 2018-05-14 DIAGNOSIS — C8331 Diffuse large B-cell lymphoma, lymph nodes of head, face, and neck: Secondary | ICD-10-CM | POA: Diagnosis not present

## 2018-05-15 ENCOUNTER — Ambulatory Visit
Admission: RE | Admit: 2018-05-15 | Discharge: 2018-05-15 | Disposition: A | Discharge status: Home / Self Care | Payer: 59 | Source: Ambulatory Visit | Attending: Radiation Oncology | Admitting: Radiation Oncology

## 2018-05-15 DIAGNOSIS — C8331 Diffuse large B-cell lymphoma, lymph nodes of head, face, and neck: Secondary | ICD-10-CM | POA: Diagnosis not present

## 2018-05-18 ENCOUNTER — Inpatient Hospital Stay: Payer: 59

## 2018-05-18 ENCOUNTER — Ambulatory Visit
Admission: RE | Admit: 2018-05-18 | Discharge: 2018-05-18 | Disposition: A | Discharge status: Home / Self Care | Payer: 59 | Source: Ambulatory Visit | Attending: Radiation Oncology | Admitting: Radiation Oncology

## 2018-05-18 ENCOUNTER — Ambulatory Visit: Payer: 59

## 2018-05-18 ENCOUNTER — Inpatient Hospital Stay (HOSPITAL_BASED_OUTPATIENT_CLINIC_OR_DEPARTMENT_OTHER): Payer: 59 | Admitting: Nurse Practitioner

## 2018-05-18 ENCOUNTER — Ambulatory Visit
Admission: RE | Admit: 2018-05-18 | Discharge: 2018-05-18 | Disposition: A | Discharge status: Home / Self Care | Payer: 59 | Source: Ambulatory Visit | Attending: Nurse Practitioner | Admitting: Nurse Practitioner

## 2018-05-18 ENCOUNTER — Telehealth: Payer: Self-pay | Admitting: *Deleted

## 2018-05-18 VITALS — BP 105/70 | HR 83 | Temp 98.0°F | Resp 18 | Wt 174.0 lb

## 2018-05-18 DIAGNOSIS — C8338 Diffuse large B-cell lymphoma, lymph nodes of multiple sites: Secondary | ICD-10-CM | POA: Diagnosis not present

## 2018-05-18 DIAGNOSIS — E860 Dehydration: Secondary | ICD-10-CM

## 2018-05-18 DIAGNOSIS — Z170 Estrogen receptor positive status [ER+]: Secondary | ICD-10-CM

## 2018-05-18 DIAGNOSIS — Z801 Family history of malignant neoplasm of trachea, bronchus and lung: Secondary | ICD-10-CM

## 2018-05-18 DIAGNOSIS — Z79899 Other long term (current) drug therapy: Secondary | ICD-10-CM

## 2018-05-18 DIAGNOSIS — Z79811 Long term (current) use of aromatase inhibitors: Secondary | ICD-10-CM

## 2018-05-18 DIAGNOSIS — R110 Nausea: Secondary | ICD-10-CM

## 2018-05-18 DIAGNOSIS — C8331 Diffuse large B-cell lymphoma, lymph nodes of head, face, and neck: Secondary | ICD-10-CM

## 2018-05-18 DIAGNOSIS — R011 Cardiac murmur, unspecified: Secondary | ICD-10-CM

## 2018-05-18 DIAGNOSIS — R509 Fever, unspecified: Secondary | ICD-10-CM

## 2018-05-18 DIAGNOSIS — Z9049 Acquired absence of other specified parts of digestive tract: Secondary | ICD-10-CM | POA: Insufficient documentation

## 2018-05-18 DIAGNOSIS — R634 Abnormal weight loss: Secondary | ICD-10-CM | POA: Diagnosis not present

## 2018-05-18 DIAGNOSIS — C8330 Diffuse large B-cell lymphoma, unspecified site: Secondary | ICD-10-CM

## 2018-05-18 DIAGNOSIS — Z95828 Presence of other vascular implants and grafts: Secondary | ICD-10-CM

## 2018-05-18 DIAGNOSIS — R05 Cough: Secondary | ICD-10-CM

## 2018-05-18 DIAGNOSIS — C50211 Malignant neoplasm of upper-inner quadrant of right female breast: Secondary | ICD-10-CM

## 2018-05-18 DIAGNOSIS — Z85828 Personal history of other malignant neoplasm of skin: Secondary | ICD-10-CM

## 2018-05-18 DIAGNOSIS — Z923 Personal history of irradiation: Secondary | ICD-10-CM

## 2018-05-18 DIAGNOSIS — Z9221 Personal history of antineoplastic chemotherapy: Secondary | ICD-10-CM

## 2018-05-18 DIAGNOSIS — I951 Orthostatic hypotension: Secondary | ICD-10-CM

## 2018-05-18 DIAGNOSIS — Z5189 Encounter for other specified aftercare: Secondary | ICD-10-CM

## 2018-05-18 DIAGNOSIS — B370 Candidal stomatitis: Secondary | ICD-10-CM

## 2018-05-18 DIAGNOSIS — K838 Other specified diseases of biliary tract: Secondary | ICD-10-CM | POA: Insufficient documentation

## 2018-05-18 DIAGNOSIS — Z8041 Family history of malignant neoplasm of ovary: Secondary | ICD-10-CM

## 2018-05-18 LAB — COMPREHENSIVE METABOLIC PANEL
ALK PHOS: 87 U/L (ref 38–126)
ALT: 45 U/L (ref 14–54)
ANION GAP: 14 (ref 5–15)
AST: 34 U/L (ref 15–41)
Albumin: 4.5 g/dL (ref 3.5–5.0)
BILIRUBIN TOTAL: 2.2 mg/dL — AB (ref 0.3–1.2)
BUN: 12 mg/dL (ref 6–20)
CALCIUM: 9.9 mg/dL (ref 8.9–10.3)
CO2: 24 mmol/L (ref 22–32)
CREATININE: 0.61 mg/dL (ref 0.44–1.00)
Chloride: 100 mmol/L — ABNORMAL LOW (ref 101–111)
GFR calc Af Amer: >60 mL/min (ref >60)
GFR calc non Af Amer: >60 mL/min (ref >60)
Glucose, Bld: 69 mg/dL (ref 65–99)
Potassium: 3.8 mmol/L (ref 3.5–5.1)
SODIUM: 138 mmol/L (ref 135–145)
TOTAL PROTEIN: 7.1 g/dL (ref 6.5–8.1)

## 2018-05-18 LAB — CBC WITH DIFFERENTIAL/PLATELET
BASOS PCT: 0 %
Basophils Absolute: 0 10*3/uL (ref 0–0.1)
Eosinophils Absolute: 0.1 10*3/uL (ref 0–0.7)
Eosinophils Relative: 1 %
HEMATOCRIT: 42 % (ref 35.0–47.0)
Hemoglobin: 14.8 g/dL (ref 12.0–16.0)
Lymphocytes Relative: 13 %
Lymphs Abs: 0.6 10*3/uL — ABNORMAL LOW (ref 1.0–3.6)
MCH: 29.9 pg (ref 26.0–34.0)
MCHC: 35.3 g/dL (ref 32.0–36.0)
MCV: 84.8 fL (ref 80.0–100.0)
MONO ABS: 0.5 10*3/uL (ref 0.2–0.9)
MONOS PCT: 10 %
NEUTROS ABS: 3.5 10*3/uL (ref 1.4–6.5)
Neutrophils Relative %: 76 %
PLATELETS: 157 10*3/uL (ref 150–440)
RBC: 4.95 MIL/uL (ref 3.80–5.20)
RDW: 12.2 % (ref 11.5–14.5)
WBC: 4.7 10*3/uL (ref 3.6–11.0)

## 2018-05-18 LAB — MAGNESIUM: Magnesium: 1.9 mg/dL (ref 1.7–2.4)

## 2018-05-18 MED ORDER — HEPARIN SOD (PORK) LOCK FLUSH 100 UNIT/ML IV SOLN
500.0000 [IU] | Freq: Once | INTRAVENOUS | Status: AC
  Administered 2018-05-18: 500 [IU] via INTRAVENOUS

## 2018-05-18 MED ORDER — SODIUM CHLORIDE 0.9% FLUSH
10.0000 mL | INTRAVENOUS | Status: DC | PRN
  Filled 2018-05-18: qty 10

## 2018-05-18 MED ORDER — ONDANSETRON HCL 4 MG/2ML IJ SOLN
4.0000 mg | Freq: Once | INTRAMUSCULAR | Status: AC
  Administered 2018-05-18: 4 mg via INTRAVENOUS
  Filled 2018-05-18: qty 2

## 2018-05-18 MED ORDER — SODIUM CHLORIDE 0.9 % IV SOLN
Freq: Once | INTRAVENOUS | Status: AC
  Administered 2018-05-18: 09:00:00 via INTRAVENOUS
  Filled 2018-05-18: qty 1000

## 2018-05-18 MED ORDER — ONDANSETRON HCL 8 MG PO TABS: 8.0000 mg | ORAL_TABLET | Freq: Three times a day (TID) | ORAL | 0 refills | Status: DC | PRN

## 2018-05-18 NOTE — Progress Notes (Signed)
Symptom Management [facility]  [phone] [phone]) [phone]  Patient Care Team: Virginia Crews, MD as PCP - General (Family Medicine) Margarita Rana, MD as Referring Physician (Family Medicine) Bary Castilla Forest Gleason, MD (General Surgery)   Name of the patient: Victoria Benson  [id number]  1956-03-25   Date of visit: 05/18/18  Diagnosis-stage II E DLBCL.   Chief complaint/ Reason for visit- weakness and dehydration  Heme/Onc history: Patient initially presented with symptoms of upper respiratory infection and nasal congestion 10/2017.  Around same time she developed a right neck mass which was initially attributed to possible tonsillitis and she was given antibiotics for same.  However her neck mass did not decrease in size and she eventually saw Dr. Pryor Ochoa from ENT.  Patient was found to have significant right cervical adenopathy and underwent CT soft tissue of neck which showed right level 2 and 3 adenopathy favoring metastatic carcinoma.Jugulodigastric node measuring up to 3.7 cm.  Asymmetric fullness of the right tonsillar fossa possibly primary site.  Patient underwent core biopsy of the right cervical lymph node.  Pathology showed diffuse large B-cell lymphoma of the ABC subtype.  IHC was positive for BCL-2 and BCL 6.  Ki-67 was high 80-90%.   Prior to 10/2017 she had felt well and denied any previous fevers, chills, unintentional weight loss, or drenching night sweats.  She did report some difficulty swallowing due to right neck mass but denied any shortness of breath.  She received 1 dose of 100 mg prednisone which improved her ability to swallow.  PET/CT scan on 01/12/2018 showed: 1. Hypermetabolism identified in the tonsillar fossa bilaterally, right greater than left. Hypermetabolism on today's study corresponds to the lesion seen on the recent diagnostic CT of the neck. 2. Hypermetabolic lymphadenopathy in the right neck (level II  and III). No evidence for hypermetabolic lymphadenopathy in the contralateral neck. 3. No hypermetabolic disease in the chest, abdomen, or pelvis. No evidence for hypermetabolic disease in the skeleton.  Bone marrow biopsy showed no evidence of lymphoma.  IPI score 0-low risk.  Does not meet criteria for CNS prophylaxis  MUGA on 01/14/18 showed EF of 53%.  Initiated R-CHOP chemotherapy on 01/16/18, completing 3 cycyles on 02/27/18.  03/19/18 PET- 1. Complete response to therapy with no hypermetabolic or enlarged cervical lymph nodes. [place] 1. 2. No new areas of hypermetabolism. 3. New ground-glass opacities in the lungs, likely atelectasis, edema or drug reaction. No associated hypermetabolic activity. 4. Tip of the right IJ Port-A-Cath extends to the inferior aspect of the right atrium. This may be related to imaging in the supine position. Suggest radiographic correlation for catheter position and the pulmonary findings.   She initiated radiation therapy on 04/21/18 and will complete therapy on 05/20/18.   Interval history- Patient presents to [facility] with complaints of dehydration and weakness. Symptoms started several weeks ago and have gradually worsened since being on radiation therapy. She has poor oral intake and weakness. She reports dizziness when changing position which has caused her to be more sedentary. Nothing seems to make symptoms better or worse. Has mild nausea. She has tried increasing her oral intake without much success. She has tried resting without much improvement.   ECOG FS:1 - Symptomatic but completely ambulatory  Review of systems- Review of Systems  Constitutional: Positive for malaise/fatigue and weight loss. Negative for chills and fever.  HENT: Negative for congestion, ear discharge, ear pain, sinus pain, sore throat and tinnitus.   Eyes: Negative.  Respiratory: Negative.  Negative for cough, sputum production and shortness of breath.   Cardiovascular: Negative for  chest pain, palpitations, orthopnea, claudication and leg swelling.  Gastrointestinal: Positive for nausea. Negative for abdominal pain, blood in stool, constipation, diarrhea, heartburn and vomiting.  Genitourinary: Negative.   Musculoskeletal: Negative.   Skin: Negative.   Neurological: Positive for weakness. Negative for dizziness, tingling and headaches.  Endo/Heme/Allergies: Negative.   Psychiatric/Behavioral: Negative.      Current treatment- radiation  No Known Allergies   Past Medical History:  Diagnosis Date  . BRCA gene mutation negative 07/10/2016  . Breast cancer ([place]) 2009   T1c (1.2 cm) N0 ER 90%, PR 90%, HER-2/neu not amplified. Oncotype DX score, low risk (14) 9% chance of recurrence with antiestrogen therapy alone.  . Breast cancer ([place]) 06-17-16   INVASIVE MAMMARY CARCINOMA . T1, ER positive, PR positive, HER-2/neu not overexpressed  . Family history of adverse reaction to anesthesia    PTS MOM-HARD TO WAKE UP  . Heart murmur   . Radiation 2009   BREAST CA  . Skin cancer      Past Surgical History:  Procedure Laterality Date  . BREAST BIOPSY Right 06/17/2016   invasive. T1, ER positive, PR positive, HER-2/neu not overexpressed  . BREAST CYST ASPIRATION Left 2002  . BREAST EXCISIONAL BIOPSY Right 06/2016   lumpectomy with rad   . BREAST EXCISIONAL BIOPSY Right 2009   breast ca +  . BREAST LUMPECTOMY WITH NEEDLE LOCALIZATION Right 07/18/2016   Procedure: BREAST LUMPECTOMY WITH NEEDLE LOCALIZATION AND RIGHT AXILLARY EXPLORATION;  Surgeon: Robert Bellow, MD;  Location: [facility] ORS;  Service: General;  Laterality: Right;  . BREAST SURGERY Right July 2009   Wide excision, sentinel left node biopsy MammoSite. ( 06/21/2008  . CHOLECYSTECTOMY  1984  . DILATION AND CURETTAGE OF UTERUS  2004  . ENDOMETRIAL ABLATION  2005  . IR FLUORO GUIDE PORT INSERTION RIGHT  01/15/2018  . TUMOR REMOVAL  12/2013   from uterus done by [facility]    Social History    Socioeconomic History  . Marital status: Married    Spouse name: [name]  . Number of children: 2  . Years of education: 19  . Highest education level: Not on file  Occupational History  . Occupation: unemployed  Social Needs  . Financial resource strain: Not on file  . Food insecurity:    Worry: Not on file    Inability: Not on file  . Transportation needs:    Medical: Not on file    Non-medical: Not on file  Tobacco Use  . Smoking status: Never Smoker  . Smokeless tobacco: Never Used  Substance and Sexual Activity  . Alcohol use: No  . Drug use: No  . Sexual activity: Yes  Lifestyle  . Physical activity:    Days per week: Not on file    Minutes per session: Not on file  . Stress: Not on file  Relationships  . Social connections:    Talks on phone: Not on file    Gets together: Not on file    Attends religious service: Not on file    Active member of club or organization: Not on file    Attends meetings of clubs or organizations: Not on file    Relationship status: Not on file  . Intimate partner violence:    Fear of current or ex partner: Not on file    Emotionally abused: Not on file    Physically  abused: Not on file    Forced sexual activity: Not on file  Other Topics Concern  . Not on file  Social History Narrative  . Not on file    Family History  Problem Relation Age of Onset  . CAD Mother   . Osteoporosis Mother   . Lung cancer Father   . Diabetes Father   . Hypertension Brother   . Ovarian cancer Maternal Grandmother   . Heart attack Paternal Grandmother   . Diabetes Paternal Grandfather   . Fibromyalgia Brother   . Hypertension Brother   . Hypertension Brother   . Sleep apnea Brother   . Healthy Brother   . Sleep apnea Brother   . Ovarian cancer Other   . Breast cancer Neg Hx      Current Outpatient Medications:  .  anastrozole (ARIMIDEX) 1 MG tablet, TAKE ONE TABLET BY MOUTH EVERY DAY (Patient not taking: Reported on 05/18/2018), Disp:  90 tablet, Rfl: 3 .  CALCIUM-MAGNESIUM-VITAMIN D ER PO, Take 1 tablet by mouth daily., Disp: , Rfl:  .  cetirizine (ZYRTEC) 10 MG tablet, Take 10 mg daily by mouth., Disp: , Rfl:  .  fluticasone (FLONASE) 50 MCG/ACT nasal spray, Place 2 sprays daily into both nostrils. (Patient not taking: Reported on 05/18/2018), Disp: 16 g, Rfl: 6 .  lidocaine-prilocaine (EMLA) cream, Apply to affected area once (Patient not taking: Reported on 05/18/2018), Disp: 30 g, Rfl: 3 .  LORazepam (ATIVAN) 0.5 MG tablet, Take 1 tablet (0.5 mg total) by mouth every 6 (six) hours as needed (nausea and vomiting). (Patient not taking: Reported on 02/27/2018), Disp: 30 tablet, Rfl: 0 .  Multiple Vitamin (MULTIVITAMIN) capsule, Take 1 capsule by mouth daily., Disp: , Rfl:  .  nystatin (MYCOSTATIN) 100000 UNIT/ML suspension, Take 5 mLs (500,000 Units total) by mouth 4 (four) times daily. (Patient not taking: Reported on 05/18/2018), Disp: 60 mL, Rfl: 0 .  Omega-3 Krill Oil 500 MG CAPS, Take 1,000 mg by mouth daily. , Disp: , Rfl:  .  ondansetron (ZOFRAN) 8 MG tablet, Take 1 tablet (8 mg total) by mouth 2 (two) times daily as needed for nausea or vomiting. Start on day 3 after your treatment of cyclophosphamide chemotherapy (Patient not taking: Reported on 03/20/2018), Disp: 20 tablet, Rfl: 1 .  predniSONE (DELTASONE) 50 MG tablet, Take 2 tablets (100 mg total) by mouth daily with breakfast. Take on day 1-5 of chemotherapy  With each treatment cycle (Patient not taking: Reported on 03/20/2018), Disp: 40 tablet, Rfl: 0 .  prochlorperazine (COMPAZINE) 10 MG tablet, Take 1 tablet (10 mg total) by mouth every 6 (six) hours as needed for nausea or vomiting. (Patient not taking: Reported on 02/27/2018), Disp: 30 tablet, Rfl: 3 .  sucralfate (CARAFATE) 1 g tablet, Take 1 tablet (1 g total) by mouth 3 (three) times daily before meals. (Patient not taking: Reported on 05/18/2018), Disp: 90 tablet, Rfl: 3  Physical exam:  Vitals:   05/18/18 0912  BP:  105/70  Pulse: 83  Resp: 18  Temp: 98 F (36.7 C)  TempSrc: Tympanic  SpO2: 98%  Weight: 174 lb (78.9 kg)   Physical Exam  Constitutional: She is oriented to person, place, and time. She appears well-developed and well-nourished.  Fatigued appearing  HENT:  Head: Atraumatic.  Nose: Nose normal.  Mouth/Throat: Oropharynx is clear and moist. No oropharyngeal exudate.  Eyes: Conjunctivae are normal. No scleral icterus.  Neck: Normal range of motion.  Cardiovascular: Normal rate, regular rhythm and  normal heart sounds.  Pulmonary/Chest: Effort normal and breath sounds normal.  Abdominal: Soft. Bowel sounds are normal.  Musculoskeletal: She exhibits no edema.  Neurological: She is alert and oriented to person, place, and time.  Skin: Skin is warm and dry.  Psychiatric: She has a normal mood and affect.     CMP Latest Ref Rng & Units 05/18/2018  Glucose 65 - 99 mg/dL 69  BUN 6 - 20 mg/dL 12  Creatinine 0.44 - 1.00 mg/dL 0.61  Sodium 135 - 145 mmol/L 138  Potassium 3.5 - 5.1 mmol/L 3.8  Chloride 101 - 111 mmol/L 100(L)  CO2 22 - 32 mmol/L 24  Calcium 8.9 - 10.3 mg/dL 9.9  Total Protein 6.5 - 8.1 g/dL 7.1  Total Bilirubin 0.3 - 1.2 mg/dL 2.2(H)  Alkaline Phos 38 - 126 U/L 87  AST 15 - 41 U/L 34  ALT 14 - 54 U/L 45   CBC Latest Ref Rng & Units 05/18/2018  WBC 3.6 - 11.0 K/uL 4.7  Hemoglobin 12.0 - 16.0 g/dL 14.8  Hematocrit 35.0 - 47.0 % 42.0  Platelets 150 - 440 K/uL 157    No images are attached to the encounter.  Dg Chest 2 View  Result Date: 05/08/2018 CLINICAL DATA:  Cough. EXAM: CHEST - 2 VIEW COMPARISON:  Radiographs of February 01, 2014. FINDINGS: The heart size and mediastinal contours are within normal limits. Both lungs are clear. No pneumothorax or pleural effusion is noted. Interval placement of right internal jugular Port-A-Cath with distal tip in expected position of SVC. The visualized skeletal structures are unremarkable. IMPRESSION: No active cardiopulmonary  disease. Electronically Signed   By: Marijo Conception, M.D.   On: 05/08/2018 12:28     Assessment and plan- Patient is a 62 y.o. female with stage II E DLBCL who presents to [facility] with complaints of dehydration and weakness.    1. Stage II E DLBCL- s/p 3 cycles of R-CHOP, now on radiation. Nearly completed radiation. Will follow up with Dr. Janese Banks as scheduled in June 2019.   2. Convalescence following radiotherapy- Poor oral intake and weakness associated with radiation. Will give zofran 57m IV in clinic for associated nausea. Will add zofran tablets to med list.   3. Orthostatic Hypotension- fluids given in clinic with improvement in symptoms.   4. Malnutrition- 8% weight loss in past month. Weight loss and poor oral intake likely contributing to symptoms. Patient to meet with JJennet Maduro RD in clinic today.   5. Hyperbilirubinemia- bili elevated today at 2.2. Was 1.2 on 05/08/18. Ultrasound obtained showed mild biliary duct dilatation which is stable from recent PET. Suspect physiologic d/t hx of cholecystectomy. Continue to monitor as she is asymptomatic. Recheck labs in 1 week.   Will have patient rtc in 3 days to see JRulon Abidefor re-evaluation and possible fluids then again in 1 week for labs only to re-evaluate bilirubin.    Visit Diagnosis 1. Diffuse large B-cell lymphoma of lymph nodes of neck (HCC)   2. Convalescence after radiotherapy   3. Orthostatic hypotension   4. Hyperbilirubinemia   5. Diffuse large B-cell lymphoma, unspecified body region ([facility]     Patient expressed understanding and was in agreement with this plan. She also understands that She can call clinic at any time with any questions, concerns, or complaints.    LBeckey Rutter DNP, AGNP-C CE[place]at [phone](work cell) [phone](office) 05/26/18 8:50 AM

## 2018-05-18 NOTE — Telephone Encounter (Signed)
Patient is still very weak and not eating well. He thinks she needs to be seen today and possibly get some fluids. Instructed him to bring her on in to see NP in [facility]. He verbalized understanding.      dhs

## 2018-05-18 NOTE — Patient Instructions (Signed)
Ondansetron tablets What is this medicine? ONDANSETRON (on DAN se tron) is used to treat nausea and vomiting caused by chemotherapy. It is also used to prevent or treat nausea and vomiting after surgery. This medicine may be used for other purposes; ask your health care provider or pharmacist if you have questions. COMMON BRAND NAME(S): Zofran What should I tell my health care provider before I take this medicine? They need to know if you have any of these conditions: -heart disease -history of irregular heartbeat -liver disease -low levels of magnesium or potassium in the blood -an unusual or allergic reaction to ondansetron, granisetron, other medicines, foods, dyes, or preservatives -pregnant or trying to get pregnant -breast-feeding How should I use this medicine? Take this medicine by mouth with a glass of water. Follow the directions on your prescription label. Take your doses at regular intervals. Do not take your medicine more often than directed. Talk to your pediatrician regarding the use of this medicine in children. Special care may be needed. Overdosage: If you think you have taken too much of this medicine contact a poison control center or emergency room at once. NOTE: This medicine is only for you. Do not share this medicine with others. What if I miss a dose? If you miss a dose, take it as soon as you can. If it is almost time for your next dose, take only that dose. Do not take double or extra doses. What may interact with this medicine? Do not take this medicine with any of the following medications: -apomorphine -certain medicines for fungal infections like fluconazole, itraconazole, ketoconazole, posaconazole, voriconazole -cisapride -dofetilide -dronedarone -pimozide -thioridazine -ziprasidone This medicine may also interact with the following medications: -carbamazepine -certain medicines for depression, anxiety, or psychotic  disturbances -fentanyl -linezolid -MAOIs like Carbex, Eldepryl, Marplan, Nardil, and Parnate -methylene blue (injected into a vein) -other medicines that prolong the QT interval (cause an abnormal heart rhythm) -phenytoin -rifampicin -tramadol This list may not describe all possible interactions. Give your health care provider a list of all the medicines, herbs, non-prescription drugs, or dietary supplements you use. Also tell them if you smoke, drink alcohol, or use illegal drugs. Some items may interact with your medicine. What should I watch for while using this medicine? Check with your doctor or health care professional right away if you have any sign of an allergic reaction. What side effects may I notice from receiving this medicine? Side effects that you should report to your doctor or health care professional as soon as possible: -allergic reactions like skin rash, itching or hives, swelling of the face, lips or tongue -breathing problems -confusion -dizziness -fast or irregular heartbeat -feeling faint or lightheaded, falls -fever and chills -loss of balance or coordination -seizures -sweating -swelling of the hands or feet -tightness in the chest -tremors -unusually weak or tired Side effects that usually do not require medical attention (report to your doctor or health care professional if they continue or are bothersome): -constipation or diarrhea -headache This list may not describe all possible side effects. Call your doctor for medical advice about side effects. You may report side effects to FDA at 1-800-FDA-1088. Where should I keep my medicine? Keep out of the reach of children. Store between 2 and 30 degrees C (36 and 86 degrees F). Throw away any unused medicine after the expiration date. NOTE: This sheet is a summary. It may not cover all possible information. If you have questions about this medicine, talk to your doctor, pharmacist,  or health care  provider.  2018 Elsevier/Gold Standard (2013-09-22 16:27:45)

## 2018-05-18 NOTE — Progress Notes (Addendum)
Nutrition Assessment   Reason for Assessment:   Verbal referral from [place], NP for nausea vomiting, weight loss, poor appetite  ASSESSMENT:  62 year old female with b cell lymphoma and right neck mass.  Patient s/py 3 cycles of R-CHOP.  Patient currently receiving radiation therapy to right neck mass. Past medical history of breast cancer.  Met with patient and husband in symptom management clinic this am.  Patient poor appetite for the last 2 weeks especially.  Reports has no taste and/or things have a metallic taste.  Also having issues with nausea.  Reports that she has been drinking boost mixed with milk, pudding (although sometimes to thick for her to swallow) and wonton soup (without the noodles), applesauce.  Reports that tried eggs recently and "scatchy" in the back of her throat and hard to swallow.    Nutrition Focused Physical Exam: deferred this am  Medications: Calcium/Vit D, MVI, nystatin, omega 3, zofran  Labs: reviewed  Anthropometrics:   Height: 63 inches Weight: 174 lb UBW: 184 lb before treatment per pt. Noticed last at 184 lb on 02/06/18 BMI: 29  Per chart 190 lb on 4/2  8% weight loss in the last month,significant   Estimated Energy Needs  Kcals: 1700-1975 calories/d Protein: 77-102 g/d Fluid: > 1.7 L/d  NUTRITION DIAGNOSIS: Inadequate oral intake related to cancer related treatment side effects (nausea, dysphagia) as evidenced by 8% weight loss in the last month and eating < 50% of energy needs for > or equal to 5 days.     MALNUTRITION DIAGNOSIS: Patient meets criteria for severe malnutrition in context of acute illness as evidenced by 8% weight loss in the last month and eating < 50% of energy needs for > or equal to 5 days.    INTERVENTION:   Noted nausea medication will be adjusted per NP today.  Encouraged patient to take nausea medication to help prevent symptoms and allow patient to eat.  Discussed foods to eat during intense days of nausea vs  mild symptoms.  Fact sheet given.   Discussed oral nutrition supplements.  Gave patient sample of clear liquid supplements.  May be better tolerated with nausea.  Encouraged pedialyte and samples given to patient.  Provided patient with high calorie, high protein recipes from [employer] [facility] Encouraged small frequent meals Also discussed pureeing foods in blender for smooth consistency.  Recommend SLP evaluation with patient receiving radiation.    MONITORING, EVALUATION, GOAL: Patient will consume adequate calories and protein to meet nutritional needs.   NEXT VISIT: May 23 during infusion  Merary Garguilo B. [name], [place], [place] Registered Dietitian [phone] (pager)

## 2018-05-19 ENCOUNTER — Ambulatory Visit
Admission: RE | Admit: 2018-05-19 | Discharge: 2018-05-19 | Disposition: A | Discharge status: Home / Self Care | Payer: 59 | Source: Ambulatory Visit | Attending: Radiation Oncology | Admitting: Radiation Oncology

## 2018-05-19 DIAGNOSIS — C8331 Diffuse large B-cell lymphoma, lymph nodes of head, face, and neck: Secondary | ICD-10-CM | POA: Diagnosis not present

## 2018-05-20 ENCOUNTER — Ambulatory Visit
Admission: RE | Admit: 2018-05-20 | Discharge: 2018-05-20 | Disposition: A | Discharge status: Home / Self Care | Payer: 59 | Source: Ambulatory Visit | Attending: Radiation Oncology | Admitting: Radiation Oncology

## 2018-05-20 DIAGNOSIS — C8331 Diffuse large B-cell lymphoma, lymph nodes of head, face, and neck: Secondary | ICD-10-CM | POA: Diagnosis not present

## 2018-05-21 ENCOUNTER — Inpatient Hospital Stay (HOSPITAL_BASED_OUTPATIENT_CLINIC_OR_DEPARTMENT_OTHER): Payer: 59 | Admitting: Oncology

## 2018-05-21 ENCOUNTER — Inpatient Hospital Stay: Payer: 59

## 2018-05-21 VITALS — BP 105/67 | HR 80 | Temp 98.3°F | Resp 18 | Wt 172.9 lb

## 2018-05-21 DIAGNOSIS — Z8041 Family history of malignant neoplasm of ovary: Secondary | ICD-10-CM

## 2018-05-21 DIAGNOSIS — R05 Cough: Secondary | ICD-10-CM | POA: Diagnosis not present

## 2018-05-21 DIAGNOSIS — R634 Abnormal weight loss: Secondary | ICD-10-CM | POA: Diagnosis not present

## 2018-05-21 DIAGNOSIS — R112 Nausea with vomiting, unspecified: Secondary | ICD-10-CM

## 2018-05-21 DIAGNOSIS — Z85828 Personal history of other malignant neoplasm of skin: Secondary | ICD-10-CM

## 2018-05-21 DIAGNOSIS — R110 Nausea: Secondary | ICD-10-CM | POA: Diagnosis not present

## 2018-05-21 DIAGNOSIS — Z79899 Other long term (current) drug therapy: Secondary | ICD-10-CM

## 2018-05-21 DIAGNOSIS — B370 Candidal stomatitis: Secondary | ICD-10-CM | POA: Diagnosis not present

## 2018-05-21 DIAGNOSIS — E860 Dehydration: Secondary | ICD-10-CM | POA: Diagnosis not present

## 2018-05-21 DIAGNOSIS — C50211 Malignant neoplasm of upper-inner quadrant of right female breast: Secondary | ICD-10-CM

## 2018-05-21 DIAGNOSIS — Z801 Family history of malignant neoplasm of trachea, bronchus and lung: Secondary | ICD-10-CM

## 2018-05-21 DIAGNOSIS — Z170 Estrogen receptor positive status [ER+]: Secondary | ICD-10-CM

## 2018-05-21 DIAGNOSIS — C8338 Diffuse large B-cell lymphoma, lymph nodes of multiple sites: Secondary | ICD-10-CM | POA: Diagnosis not present

## 2018-05-21 DIAGNOSIS — R630 Anorexia: Secondary | ICD-10-CM

## 2018-05-21 DIAGNOSIS — R509 Fever, unspecified: Secondary | ICD-10-CM | POA: Diagnosis not present

## 2018-05-21 DIAGNOSIS — Z95828 Presence of other vascular implants and grafts: Secondary | ICD-10-CM

## 2018-05-21 DIAGNOSIS — R011 Cardiac murmur, unspecified: Secondary | ICD-10-CM

## 2018-05-21 DIAGNOSIS — Z9221 Personal history of antineoplastic chemotherapy: Secondary | ICD-10-CM

## 2018-05-21 DIAGNOSIS — Z923 Personal history of irradiation: Secondary | ICD-10-CM

## 2018-05-21 DIAGNOSIS — Z79811 Long term (current) use of aromatase inhibitors: Secondary | ICD-10-CM

## 2018-05-21 DIAGNOSIS — Z853 Personal history of malignant neoplasm of breast: Secondary | ICD-10-CM

## 2018-05-21 DIAGNOSIS — C8331 Diffuse large B-cell lymphoma, lymph nodes of head, face, and neck: Secondary | ICD-10-CM

## 2018-05-21 MED ORDER — SODIUM CHLORIDE 0.9% FLUSH
10.0000 mL | INTRAVENOUS | Status: DC | PRN
  Filled 2018-05-21: qty 10

## 2018-05-21 MED ORDER — ONDANSETRON HCL 4 MG/2ML IJ SOLN
8.0000 mg | Freq: Once | INTRAMUSCULAR | Status: AC
  Administered 2018-05-21: 8 mg via INTRAVENOUS
  Filled 2018-05-21: qty 4

## 2018-05-21 MED ORDER — HEPARIN SOD (PORK) LOCK FLUSH 100 UNIT/ML IV SOLN
500.0000 [IU] | Freq: Once | INTRAVENOUS | Status: AC
  Administered 2018-05-21: 500 [IU] via INTRAVENOUS
  Filled 2018-05-21: qty 5

## 2018-05-21 MED ORDER — SODIUM CHLORIDE 0.9 % IV SOLN: 10.0000 mg | Freq: Once | INTRAVENOUS | Status: DC

## 2018-05-21 MED ORDER — DEXAMETHASONE SODIUM PHOSPHATE 10 MG/ML IJ SOLN
10.0000 mg | Freq: Once | INTRAMUSCULAR | Status: AC
  Administered 2018-05-21: 10 mg via INTRAVENOUS
  Filled 2018-05-21: qty 1

## 2018-05-21 MED ORDER — SODIUM CHLORIDE 0.9 % IV SOLN
Freq: Once | INTRAVENOUS | Status: AC
  Administered 2018-05-21: 09:00:00 via INTRAVENOUS
  Filled 2018-05-21: qty 1000

## 2018-05-21 NOTE — Progress Notes (Signed)
Symptom Management Consult note [facility]  Telephone:([phone] [phone]  Patient Care Team: Virginia Crews, MD as PCP - General (Family Medicine) Margarita Rana, MD as Referring Physician (Family Medicine) Bary Castilla Forest Gleason, MD (General Surgery)   Name of the patient: Victoria Benson  [id number]  1956/04/01   Date of visit: 05/21/18  Diagnosis- stage II E DLBCL  Chief complaint/ Reason for visit- Follow-up  Heme/Onc history: Patient recently seen in symptom management orthostatic hypotension, dehydration, malnutrition and nausea and vomiting from recent radiation.  She is given 1 L NaCl and 4 mg Zofran while in clinic with improvement.  Labs revealed an elevated total bilirubin.  Abdominal ultrasound was obtained revealing no acute findings.  Mild common bile duct dilatation but was stable from previous PET scan.  Patient did not report abdominal pain.  She was also seen earlier in the month and symptom management on 05/08/2018 for dehydration, weakness, intermittent cough and mouth sores.  She was treated with IV fluids and given Magic mouthwash and nystatin for thrush and mucositis.  Radiation was held for several days.  Patient was seen by primary medical oncologist Dr. Janese Banks on 03/20/2018 where she reported some difficulty swallowing because of right neck mass and mild fatigue but otherwise was doing well.     Completed 3 cycles of R-CHOP on 02/27/2018.  Completed field radiation therapy to her right neck x17 treatments yesterday.  Missed several treatments due to dehydration and dysphasia.  Patient initially diagnosed in November 2018 after seeing her  PCP for symptoms of upper respiratory infection and new right neck mass which was thought to be tonsillitis and subsequently given an antibiotic.  Seen by Dr. Pryor Ochoa from ENT for neck mass and found to have significant right cervical adenopathy which prompted a CT of the neck showing right level 2  and 3 adenopathy favoring metastatic carcinoma.  Biopsy of right cervical lymph node showed large B cell lymphoma of the ABC subtype.  Interval history-  Victoria Benson presents , nasal congestionith a report with symptoms of dehydration, continued nausea and vomiting and dysphasia. Symptoms began on 04/21/18 when radiation started and have been progressive.  Sentinal symptom the patient feels symptoms began with initiation of radiation treatment causing dysphasia and nausea/vomiting She has been unable to keep food down due to "something being stuck in my throat".  Sometimes I just "gag". Patient denies fevers, constipation, diarrhea, pain or swelling. Symptoms have gradually worsened as radiation progresses.  Fortunately last radiation was yesterday. Severity has been severe.  Previous visits for this problem: yes, last seen 3 days ago by Beckey Rutter, NP.   ECOG FS:1 - Symptomatic but completely ambulatory  Review of systems- Review of Systems  Constitutional: Positive for malaise/fatigue. Negative for chills, fever and weight loss.  HENT: Negative for congestion and ear pain.   Eyes: Negative.  Negative for blurred vision and double vision.  Respiratory: Negative.  Negative for cough, sputum production and shortness of breath.   Cardiovascular: Negative.  Negative for chest pain, palpitations and leg swelling.  Gastrointestinal: Positive for nausea. Negative for abdominal pain, constipation, diarrhea and vomiting.       Mild dysphasia  Genitourinary: Negative for dysuria, frequency and urgency.  Musculoskeletal: Negative for back pain and falls.  Skin: Negative.  Negative for rash.  Neurological: Positive for weakness. Negative for headaches.  Endo/Heme/Allergies: Negative.  Does not bruise/bleed easily.  Psychiatric/Behavioral: Negative.  Negative for depression. The patient is not  nervous/anxious and does not have insomnia.      Current treatment- S/p 3 cycles of R-CHOP- Finished  radiation.   No Known Allergies   Past Medical History:  Diagnosis Date  . BRCA gene mutation negative 07/10/2016  . Breast cancer ([place]) 2009   T1c (1.2 cm) N0 ER 90%, PR 90%, HER-2/neu not amplified. Oncotype DX score, low risk (14) 9% chance of recurrence with antiestrogen therapy alone.  . Breast cancer ([place]) 06-17-16   INVASIVE MAMMARY CARCINOMA . T1, ER positive, PR positive, HER-2/neu not overexpressed  . Family history of adverse reaction to anesthesia    PTS MOM-HARD TO WAKE UP  . Heart murmur   . Radiation 2009   BREAST CA  . Skin cancer      Past Surgical History:  Procedure Laterality Date  . BREAST BIOPSY Right 06/17/2016   invasive. T1, ER positive, PR positive, HER-2/neu not overexpressed  . BREAST CYST ASPIRATION Left 2002  . BREAST EXCISIONAL BIOPSY Right 06/2016   lumpectomy with rad   . BREAST EXCISIONAL BIOPSY Right 2009   breast ca +  . BREAST LUMPECTOMY WITH NEEDLE LOCALIZATION Right 07/18/2016   Procedure: BREAST LUMPECTOMY WITH NEEDLE LOCALIZATION AND RIGHT AXILLARY EXPLORATION;  Surgeon: Robert Bellow, MD;  Location: [facility] ORS;  Service: General;  Laterality: Right;  . BREAST SURGERY Right July 2009   Wide excision, sentinel left node biopsy MammoSite. ( 06/21/2008  . CHOLECYSTECTOMY  1984  . DILATION AND CURETTAGE OF UTERUS  2004  . ENDOMETRIAL ABLATION  2005  . IR FLUORO GUIDE PORT INSERTION RIGHT  01/15/2018  . TUMOR REMOVAL  12/2013   from uterus done by [facility]    Social History   Socioeconomic History  . Marital status: Married    Spouse name: [name]  . Number of children: 2  . Years of education: 5  . Highest education level: Not on file  Occupational History  . Occupation: unemployed  Social Needs  . Financial resource strain: Not on file  . Food insecurity:    Worry: Not on file    Inability: Not on file  . Transportation needs:    Medical: Not on file    Non-medical: Not on file  Tobacco Use  . Smoking status: Never  Smoker  . Smokeless tobacco: Never Used  Substance and Sexual Activity  . Alcohol use: No  . Drug use: No  . Sexual activity: Yes  Lifestyle  . Physical activity:    Days per week: Not on file    Minutes per session: Not on file  . Stress: Not on file  Relationships  . Social connections:    Talks on phone: Not on file    Gets together: Not on file    Attends religious service: Not on file    Active member of club or organization: Not on file    Attends meetings of clubs or organizations: Not on file    Relationship status: Not on file  . Intimate partner violence:    Fear of current or ex partner: Not on file    Emotionally abused: Not on file    Physically abused: Not on file    Forced sexual activity: Not on file  Other Topics Concern  . Not on file  Social History Narrative  . Not on file    Family History  Problem Relation Age of Onset  . CAD Mother   . Osteoporosis Mother   . Lung cancer Father   .  Diabetes Father   . Hypertension Brother   . Ovarian cancer Maternal Grandmother   . Heart attack Paternal Grandmother   . Diabetes Paternal Grandfather   . Fibromyalgia Brother   . Hypertension Brother   . Hypertension Brother   . Sleep apnea Brother   . Healthy Brother   . Sleep apnea Brother   . Ovarian cancer Other   . Breast cancer Neg Hx      Current Outpatient Medications:  .  anastrozole (ARIMIDEX) 1 MG tablet, TAKE ONE TABLET BY MOUTH EVERY DAY (Patient not taking: Reported on 05/18/2018), Disp: 90 tablet, Rfl: 3 .  CALCIUM-MAGNESIUM-VITAMIN D ER PO, Take 1 tablet by mouth daily., Disp: , Rfl:  .  cetirizine (ZYRTEC) 10 MG tablet, Take 10 mg daily by mouth., Disp: , Rfl:  .  fluticasone (FLONASE) 50 MCG/ACT nasal spray, Place 2 sprays daily into both nostrils. (Patient not taking: Reported on 05/18/2018), Disp: 16 g, Rfl: 6 .  lidocaine-prilocaine (EMLA) cream, Apply to affected area once (Patient not taking: Reported on 05/18/2018), Disp: 30 g, Rfl: 3 .   LORazepam (ATIVAN) 0.5 MG tablet, Take 1 tablet (0.5 mg total) by mouth every 6 (six) hours as needed (nausea and vomiting). (Patient not taking: Reported on 02/27/2018), Disp: 30 tablet, Rfl: 0 .  Multiple Vitamin (MULTIVITAMIN) capsule, Take 1 capsule by mouth daily., Disp: , Rfl:  .  nystatin (MYCOSTATIN) 100000 UNIT/ML suspension, Take 5 mLs (500,000 Units total) by mouth 4 (four) times daily. (Patient not taking: Reported on 05/18/2018), Disp: 60 mL, Rfl: 0 .  Omega-3 Krill Oil 500 MG CAPS, Take 1,000 mg by mouth daily. , Disp: , Rfl:  .  ondansetron (ZOFRAN) 8 MG tablet, Take 1 tablet (8 mg total) by mouth every 8 (eight) hours as needed for nausea or vomiting. (Patient not taking: Reported on 05/21/2018), Disp: 30 tablet, Rfl: 0 .  predniSONE (DELTASONE) 50 MG tablet, Take 2 tablets (100 mg total) by mouth daily with breakfast. Take on day 1-5 of chemotherapy  With each treatment cycle (Patient not taking: Reported on 03/20/2018), Disp: 40 tablet, Rfl: 0 .  prochlorperazine (COMPAZINE) 10 MG tablet, Take 1 tablet (10 mg total) by mouth every 6 (six) hours as needed for nausea or vomiting. (Patient not taking: Reported on 02/27/2018), Disp: 30 tablet, Rfl: 3 .  sucralfate (CARAFATE) 1 g tablet, Take 1 tablet (1 g total) by mouth 3 (three) times daily before meals. (Patient not taking: Reported on 05/18/2018), Disp: 90 tablet, Rfl: 3  Physical exam:  Vitals:   05/21/18 0833  BP: 105/67  Pulse: 80  Resp: 18  Temp: 98.3 F (36.8 C)  TempSrc: Tympanic  Weight: 172 lb 14.4 oz (78.4 kg)   Physical Exam  Constitutional: She is oriented to person, place, and time. Vital signs are normal.  HENT:  Head: Normocephalic and atraumatic.  Eyes: Pupils are equal, round, and reactive to light.  Neck: Normal range of motion.  Cardiovascular: Normal rate, regular rhythm and normal heart sounds.  No murmur heard. Pulmonary/Chest: Effort normal and breath sounds normal. She has no wheezes.  Abdominal: Soft.  Normal appearance and bowel sounds are normal. She exhibits no distension. There is no tenderness.  Musculoskeletal: Normal range of motion. She exhibits no edema.  Neurological: She is alert and oriented to person, place, and time.  Skin: Skin is warm and dry. No rash noted. There is erythema (Across chest from radiation).  Psychiatric: Judgment normal.     CMP Latest  Ref Rng & Units 05/18/2018  Glucose 65 - 99 mg/dL 69  BUN 6 - 20 mg/dL 12  Creatinine 0.44 - 1.00 mg/dL 0.61  Sodium 135 - 145 mmol/L 138  Potassium 3.5 - 5.1 mmol/L 3.8  Chloride 101 - 111 mmol/L 100(L)  CO2 22 - 32 mmol/L 24  Calcium 8.9 - 10.3 mg/dL 9.9  Total Protein 6.5 - 8.1 g/dL 7.1  Total Bilirubin 0.3 - 1.2 mg/dL 2.2(H)  Alkaline Phos 38 - 126 U/L 87  AST 15 - 41 U/L 34  ALT 14 - 54 U/L 45   CBC Latest Ref Rng & Units 05/18/2018  WBC 3.6 - 11.0 K/uL 4.7  Hemoglobin 12.0 - 16.0 g/dL 14.8  Hematocrit 35.0 - 47.0 % 42.0  Platelets 150 - 440 K/uL 157    No images are attached to the encounter.  Dg Chest 2 View  Result Date: 05/08/2018 CLINICAL DATA:  Cough. EXAM: CHEST - 2 VIEW COMPARISON:  Radiographs of February 01, 2014. FINDINGS: The heart size and mediastinal contours are within normal limits. Both lungs are clear. No pneumothorax or pleural effusion is noted. Interval placement of right internal jugular Port-A-Cath with distal tip in expected position of SVC. The visualized skeletal structures are unremarkable. IMPRESSION: No active cardiopulmonary disease. Electronically Signed   By: Marijo Conception, M.D.   On: 05/08/2018 12:28   US Abdomen Limited Ruq  Result Date: 05/18/2018 CLINICAL DATA:  Hyperbilirubinemia.  Diffuse large B-cell lymphoma EXAM: ULTRASOUND ABDOMEN LIMITED RIGHT UPPER QUADRANT COMPARISON:  PET-CT 03/19/2018 FINDINGS: Gallbladder: Surgically absent Common bile duct: Diameter: 9 mm, stable at the hepatic hilum on prior PET-CT. Where visualized, no filling defect. Liver: No focal lesion  identified. Within normal limits in parenchymal echogenicity. Portal vein is patent on color Doppler imaging with normal direction of blood flow towards the liver. IMPRESSION: No definite cause for the history. There is mild common bile duct dilatation but stable from recent PET CT and often physiologic after cholecystectomy. Electronically Signed   By: Monte Fantasia M.D.   On: 05/18/2018 12:35     Assessment and plan- Patient is a 62 y.o. female who presents for follow-up.  She was seen on Monday for dehydration, orthostatic hypotension and nausea and vomiting.  1.  Lymphoma: S/p 3 cycles of R-CHOP completed on 02/27/2018.  Finished field radiation therapy to her right neck yesterday.  Patient has scheduled follow-up with Dr. Janese Banks in June.  2.  Dehydration: Patient continues to have poor intake.  States "I feel like something is hung in the back of my throat".  Labs were not redrawn today but looked fairly good on Monday.  She will receive 1 L NaCl today.  Weight continues to decrease. Down 2 additional pounds since Monday. Met with Jolie today as well for further nutritional support.  3.  Thrush/esophagitis/mucositis: Continues to complain of problems swallowing.  This is likely due to inflammation from radiation.  She was previously treated with nystatin and Magic mouthwash with lidocaine on 05/08/2018 for thrush.  She continues to use but admits to not using over the past few days.  Patient is to call clinic if this worsens.  Has Carafate and takes daily.  4.  Chronic weakness/fatigue: Will give 10 mg Decadron with fluids today.  May help with inflammation from radiation, appetite stimulant as well as fatigue and weakness.  5.  Elevated total bilirubin: Was worked up with abdominal ultrasound revealing stable mild dilatation of the common bile duct but otherwise no  acute abnormalities.  She continues to deny abdominal pain.  We will have her return to clinic on Wednesday of next week for labs only to  recheck total bili.  Patient instructed to call clinic if she develops abdominal pain or any other concerning symptoms.  Visit Diagnosis 1. Dehydration   2. Nausea without vomiting   3. Diffuse large B-cell lymphoma of lymph nodes of neck (HCC)   4. Thrush     Patient expressed understanding and was in agreement with this plan. She also understands that She can call clinic at any time with any questions, concerns, or complaints.   Greater than 50% was spent in counseling and coordination of care with this patient including but not limited to discussion of the relevant topics above (See A&P) including, but not limited to diagnosis and management of acute and chronic medical conditions.    Faythe Casa, AGNP-C [facility] [place]- [phone] Pager- [phone] 05/21/2018 4:12 PM

## 2018-05-21 NOTE — Progress Notes (Signed)
Nutrition Follow-up:  Patient with b cell lymphoma and right neck mass.  Patient completed radiation therapy yesterday.  Seen in symptom management today for IV fluids and steroids given.    Met with patient and husband during fluids.  Patient reports ate better yesterday applesauce, green beans, broth, salad with chicken chopped really fine with dressing and tolerated well until went to brush her teeth and gagged and vomited food back up.  Later last evening ate little bit of chicken and stars soup.  Reports that ensure clear is working really well and able to handle that.  Was not able to drink orange sherbet shake, too thick and taste was off.  Also able to tolerate pedialyte and water.    Reports biggest issue at this time is thick mucus in the back of her throat and dry mouth.  Reports that she is tired of being sick and not feeling well.   Medications: reviewed  Labs: none taken today  Anthropometrics:   Weight decreased to 172 lb 14.4 oz today from 174 lb on 5/20.  UBW of 184 lb   NUTRITION DIAGNOSIS: Inadequate oral intake continues due to side effects   MALNUTRITION DIAGNOSIS: severe malnutrition continues   INTERVENTION:   Discussed strategies to help with dry mouth and thick mucus.  Fact sheet given. Samples of ensure clear given and orgain shake (no lactose or dairy).  Encouraged sipping liquids q 15 minutes to keep up with hydration.     MONITORING, EVALUATION, GOAL: weight trends, intake   NEXT VISIT: phone follow-up  Johnta Couts B. [name], [place], [place] Registered Dietitian [phone] (pager)

## 2018-05-22 ENCOUNTER — Other Ambulatory Visit: Payer: Self-pay | Admitting: Physician Assistant

## 2018-05-26 ENCOUNTER — Telehealth: Payer: Self-pay | Admitting: *Deleted

## 2018-05-26 ENCOUNTER — Encounter: Payer: Self-pay | Admitting: Nurse Practitioner

## 2018-05-26 ENCOUNTER — Inpatient Hospital Stay (HOSPITAL_BASED_OUTPATIENT_CLINIC_OR_DEPARTMENT_OTHER): Payer: 59 | Admitting: Nurse Practitioner

## 2018-05-26 ENCOUNTER — Inpatient Hospital Stay: Payer: 59

## 2018-05-26 VITALS — BP 111/70 | HR 83 | Temp 96.7°F | Resp 18

## 2018-05-26 DIAGNOSIS — B370 Candidal stomatitis: Secondary | ICD-10-CM

## 2018-05-26 DIAGNOSIS — Z85828 Personal history of other malignant neoplasm of skin: Secondary | ICD-10-CM

## 2018-05-26 DIAGNOSIS — Z95828 Presence of other vascular implants and grafts: Secondary | ICD-10-CM

## 2018-05-26 DIAGNOSIS — E876 Hypokalemia: Secondary | ICD-10-CM | POA: Diagnosis not present

## 2018-05-26 DIAGNOSIS — C50211 Malignant neoplasm of upper-inner quadrant of right female breast: Secondary | ICD-10-CM | POA: Diagnosis not present

## 2018-05-26 DIAGNOSIS — C8338 Diffuse large B-cell lymphoma, lymph nodes of multiple sites: Secondary | ICD-10-CM | POA: Diagnosis not present

## 2018-05-26 DIAGNOSIS — R011 Cardiac murmur, unspecified: Secondary | ICD-10-CM

## 2018-05-26 DIAGNOSIS — R110 Nausea: Secondary | ICD-10-CM | POA: Diagnosis not present

## 2018-05-26 DIAGNOSIS — R634 Abnormal weight loss: Secondary | ICD-10-CM

## 2018-05-26 DIAGNOSIS — C8331 Diffuse large B-cell lymphoma, lymph nodes of head, face, and neck: Secondary | ICD-10-CM

## 2018-05-26 DIAGNOSIS — R05 Cough: Secondary | ICD-10-CM | POA: Diagnosis not present

## 2018-05-26 DIAGNOSIS — R509 Fever, unspecified: Secondary | ICD-10-CM

## 2018-05-26 DIAGNOSIS — E860 Dehydration: Secondary | ICD-10-CM

## 2018-05-26 DIAGNOSIS — Z79899 Other long term (current) drug therapy: Secondary | ICD-10-CM

## 2018-05-26 DIAGNOSIS — Z170 Estrogen receptor positive status [ER+]: Secondary | ICD-10-CM | POA: Diagnosis not present

## 2018-05-26 DIAGNOSIS — Z801 Family history of malignant neoplasm of trachea, bronchus and lung: Secondary | ICD-10-CM

## 2018-05-26 DIAGNOSIS — Z79811 Long term (current) use of aromatase inhibitors: Secondary | ICD-10-CM | POA: Diagnosis not present

## 2018-05-26 DIAGNOSIS — Z8041 Family history of malignant neoplasm of ovary: Secondary | ICD-10-CM

## 2018-05-26 DIAGNOSIS — Z9221 Personal history of antineoplastic chemotherapy: Secondary | ICD-10-CM

## 2018-05-26 DIAGNOSIS — Z923 Personal history of irradiation: Secondary | ICD-10-CM

## 2018-05-26 DIAGNOSIS — Z853 Personal history of malignant neoplasm of breast: Secondary | ICD-10-CM

## 2018-05-26 LAB — COMPREHENSIVE METABOLIC PANEL
ALBUMIN: 4.3 g/dL (ref 3.5–5.0)
ALT: 45 U/L (ref 14–54)
AST: 38 U/L (ref 15–41)
Alkaline Phosphatase: 79 U/L (ref 38–126)
Anion gap: 14 (ref 5–15)
BILIRUBIN TOTAL: 2.3 mg/dL — AB (ref 0.3–1.2)
BUN: 7 mg/dL (ref 6–20)
CO2: 25 mmol/L (ref 22–32)
Calcium: 10 mg/dL (ref 8.9–10.3)
Chloride: 98 mmol/L — ABNORMAL LOW (ref 101–111)
Creatinine, Ser: 0.65 mg/dL (ref 0.44–1.00)
GFR calc Af Amer: >60 mL/min (ref >60)
Glucose, Bld: 83 mg/dL (ref 65–99)
POTASSIUM: 3.3 mmol/L — AB (ref 3.5–5.1)
Sodium: 137 mmol/L (ref 135–145)
TOTAL PROTEIN: 7.1 g/dL (ref 6.5–8.1)

## 2018-05-26 LAB — CBC WITH DIFFERENTIAL/PLATELET
BASOS ABS: 0 10*3/uL (ref 0–0.1)
Basophils Relative: 0 %
EOS PCT: 6 %
Eosinophils Absolute: 0.3 10*3/uL (ref 0–0.7)
HEMATOCRIT: 40.7 % (ref 35.0–47.0)
Hemoglobin: 14.4 g/dL (ref 12.0–16.0)
LYMPHS ABS: 0.4 10*3/uL — AB (ref 1.0–3.6)
LYMPHS PCT: 9 %
MCH: 29.6 pg (ref 26.0–34.0)
MCHC: 35.3 g/dL (ref 32.0–36.0)
MCV: 83.8 fL (ref 80.0–100.0)
Monocytes Absolute: 0.6 10*3/uL (ref 0.2–0.9)
Monocytes Relative: 13 %
NEUTROS ABS: 3.2 10*3/uL (ref 1.4–6.5)
Neutrophils Relative %: 72 %
Platelets: 149 10*3/uL — ABNORMAL LOW (ref 150–440)
RBC: 4.85 MIL/uL (ref 3.80–5.20)
RDW: 13.1 % (ref 11.5–14.5)
WBC: 4.5 10*3/uL (ref 3.6–11.0)

## 2018-05-26 LAB — MAGNESIUM: MAGNESIUM: 1.6 mg/dL — AB (ref 1.7–2.4)

## 2018-05-26 MED ORDER — FLUCONAZOLE 150 MG PO TABS: 150.0000 mg | ORAL_TABLET | Freq: Every day | ORAL | 0 refills | Status: DC

## 2018-05-26 MED ORDER — SODIUM CHLORIDE 0.9% FLUSH
10.0000 mL | Freq: Once | INTRAVENOUS | Status: AC
  Administered 2018-05-26: 10 mL via INTRAVENOUS
  Filled 2018-05-26: qty 10

## 2018-05-26 MED ORDER — HEPARIN SOD (PORK) LOCK FLUSH 100 UNIT/ML IV SOLN
500.0000 [IU] | Freq: Once | INTRAVENOUS | Status: AC
  Administered 2018-05-26: 500 [IU] via INTRAVENOUS
  Filled 2018-05-26: qty 5

## 2018-05-26 MED ORDER — SODIUM CHLORIDE 0.9 % IV SOLN
Freq: Once | INTRAVENOUS | Status: AC
  Administered 2018-05-26: 11:00:00 via INTRAVENOUS
  Filled 2018-05-26: qty 1000

## 2018-05-26 NOTE — Progress Notes (Signed)
Symptom Management Consult note [facility]  Telephone:([phone] Fax:([phone]  Patient Care Team: Virginia Crews, MD as PCP - General (Family Medicine) Margarita Rana, MD as Referring Physician (Family Medicine) Bary Castilla Forest Gleason, MD (General Surgery)   Name of the patient: Victoria Benson  [id number]  1956/08/13   Date of visit: 05/26/18  Diagnosis- stage II E DLBCL  Chief complaint/ Reason for visit- mouth sores & dehydration  Heme/Onc history:  Oncology History   Patient initially presented with symptoms of upper respiratory infection and nasal congestion 10/2017.  Around same time she developed a right neck mass which was initially attributed to possible tonsillitis and she was given antibiotics for same.  However her neck mass did not decrease in size and she eventually saw Dr. Pryor Ochoa from ENT.  Patient was found to have significant right cervical adenopathy and underwent CT soft tissue of neck which showed right level 2 and 3 adenopathy favoring metastatic carcinoma.Jugulodigastric node measuring up to 3.7 cm.  Asymmetric fullness of the right tonsillar fossa possibly primary site.  Patient underwent core biopsy of the right cervical lymph node.  Pathology showed diffuse large B-cell lymphoma of the ABC subtype.  IHC was positive for BCL-2 and BCL 6.  Ki-67 was high 80-90%.   Prior to 10/2017 she had felt well and denied any previous fevers, chills, unintentional weight loss, or drenching night sweats.  She did report some difficulty swallowing due to right neck mass but denied any shortness of breath.  She received 1 dose of 100 mg prednisone which improved her ability to swallow.  PET/CT scan on 01/12/2018 showed: 1. Hypermetabolism identified in the tonsillar fossa bilaterally, right greater than left. Hypermetabolism on today's study corresponds to the lesion seen on the recent diagnostic CT of the neck. 2. Hypermetabolic lymphadenopathy in  the right neck (level II and III). No evidence for hypermetabolic lymphadenopathy in the contralateral neck. 3. No hypermetabolic disease in the chest, abdomen, or pelvis. No evidence for hypermetabolic disease in the skeleton.  Bone marrow biopsy showed no evidence of lymphoma.  IPI score 0-low risk.  Does not meet criteria for CNS prophylaxis  MUGA on 01/14/18 showed EF of 53%.  Initiated R-CHOP chemotherapy on 01/16/18, completing 3 cycyles on 02/27/18.  03/19/18 PET- 1. Complete response to therapy with no hypermetabolic or enlarged cervical lymph nodes. [place] 1. 2. No new areas of hypermetabolism. 3. New ground-glass opacities in the lungs, likely atelectasis, edema or drug reaction. No associated hypermetabolic activity. 4. Tip of the right IJ Port-A-Cath extends to the inferior aspect of the right atrium. This may be related to imaging in the supine position. Suggest radiographic correlation for catheter position and the pulmonary findings.   She initiated radiation therapy on 04/21/18 and completed treatment on  05/20/18.        DLBCL (diffuse large B cell lymphoma) ([place])   01/14/2018 Initial Diagnosis    DLBCL (diffuse large B cell lymphoma) (HCC)        Interval history- Victoria Benson is a 62 y.o. female who complains of oral tenderness and esophageal pain with swallowing. Associated signs and symptoms include: white spots on tongue and MM, anorexia, dizziness, fatigue, weakness and weight loss.  Symptoms have been present for 1 to 2 weeks.  She feels that pain with swallowing has improved but her tongue continues to have white patches.  She was previously treated with nystatin and Magic mouthwash on 05/08/2018.  Has Carafate which she has  not been taking. She was seen in symptom management on 05/21/2018 for similar symptoms.  She was given IV fluids and steroids.  Was seen by dietitian.   ECOG FS:1 - Symptomatic but completely ambulatory  Review of systems- Review of Systems    Constitutional: Positive for malaise/fatigue. Negative for chills, fever and weight loss.  HENT: Negative for congestion and ear pain.   Eyes: Negative.  Negative for blurred vision and double vision.  Respiratory: Negative.  Negative for cough, sputum production and shortness of breath.   Cardiovascular: Negative.  Negative for chest pain, palpitations and leg swelling.  Gastrointestinal: Positive for nausea. Negative for abdominal pain, constipation, diarrhea and vomiting.       Mild dysphasia  Genitourinary: Negative for dysuria, frequency and urgency.  Musculoskeletal: Negative for back pain and falls.  Skin: Negative.  Negative for rash.  Neurological: Positive for weakness. Negative for headaches.  Endo/Heme/Allergies: Negative.  Does not bruise/bleed easily.  Psychiatric/Behavioral: Negative.  Negative for depression. The patient is not nervous/anxious and does not have insomnia.      Current treatment- S/p 3 cycles of R-CHOP- Finished radiation.   No Known Allergies   Past Medical History:  Diagnosis Date  . BRCA gene mutation negative 07/10/2016  . Breast cancer ([place]) 2009   T1c (1.2 cm) N0 ER 90%, PR 90%, HER-2/neu not amplified. Oncotype DX score, low risk (14) 9% chance of recurrence with antiestrogen therapy alone.  . Breast cancer ([place]) 06-17-16   INVASIVE MAMMARY CARCINOMA . T1, ER positive, PR positive, HER-2/neu not overexpressed  . Family history of adverse reaction to anesthesia    PTS MOM-HARD TO WAKE UP  . Heart murmur   . Radiation 2009   BREAST CA  . Skin cancer      Past Surgical History:  Procedure Laterality Date  . BREAST BIOPSY Right 06/17/2016   invasive. T1, ER positive, PR positive, HER-2/neu not overexpressed  . BREAST CYST ASPIRATION Left 2002  . BREAST EXCISIONAL BIOPSY Right 06/2016   lumpectomy with rad   . BREAST EXCISIONAL BIOPSY Right 2009   breast ca +  . BREAST LUMPECTOMY WITH NEEDLE LOCALIZATION Right 07/18/2016   Procedure:  BREAST LUMPECTOMY WITH NEEDLE LOCALIZATION AND RIGHT AXILLARY EXPLORATION;  Surgeon: Robert Bellow, MD;  Location: [facility] ORS;  Service: General;  Laterality: Right;  . BREAST SURGERY Right July 2009   Wide excision, sentinel left node biopsy MammoSite. ( 06/21/2008  . CHOLECYSTECTOMY  1984  . DILATION AND CURETTAGE OF UTERUS  2004  . ENDOMETRIAL ABLATION  2005  . IR FLUORO GUIDE PORT INSERTION RIGHT  01/15/2018  . TUMOR REMOVAL  12/2013   from uterus done by [facility]    Social History   Socioeconomic History  . Marital status: Married    Spouse name: [name]  . Number of children: 2  . Years of education: 42  . Highest education level: Not on file  Occupational History  . Occupation: unemployed  Social Needs  . Financial resource strain: Not on file  . Food insecurity:    Worry: Not on file    Inability: Not on file  . Transportation needs:    Medical: Not on file    Non-medical: Not on file  Tobacco Use  . Smoking status: Never Smoker  . Smokeless tobacco: Never Used  Substance and Sexual Activity  . Alcohol use: No  . Drug use: No  . Sexual activity: Yes  Lifestyle  . Physical activity:  Days per week: Not on file    Minutes per session: Not on file  . Stress: Not on file  Relationships  . Social connections:    Talks on phone: Not on file    Gets together: Not on file    Attends religious service: Not on file    Active member of club or organization: Not on file    Attends meetings of clubs or organizations: Not on file    Relationship status: Not on file  . Intimate partner violence:    Fear of current or ex partner: Not on file    Emotionally abused: Not on file    Physically abused: Not on file    Forced sexual activity: Not on file  Other Topics Concern  . Not on file  Social History Narrative  . Not on file    Family History  Problem Relation Age of Onset  . CAD Mother   . Osteoporosis Mother   . Lung cancer Father   . Diabetes Father   .  Hypertension Brother   . Ovarian cancer Maternal Grandmother   . Heart attack Paternal Grandmother   . Diabetes Paternal Grandfather   . Fibromyalgia Brother   . Hypertension Brother   . Hypertension Brother   . Sleep apnea Brother   . Healthy Brother   . Sleep apnea Brother   . Ovarian cancer Other   . Breast cancer Neg Hx      Current Outpatient Medications:  .  cetirizine (ZYRTEC) 10 MG tablet, Take 10 mg daily by mouth., Disp: , Rfl:  .  fluticasone (FLONASE) 50 MCG/ACT nasal spray, Place 2 sprays daily into both nostrils., Disp: 16 g, Rfl: 6 .  lidocaine-prilocaine (EMLA) cream, Apply to affected area once, Disp: 30 g, Rfl: 3 .  nystatin (MYCOSTATIN) 100000 UNIT/ML suspension, TAKE 5MLS FOUR TIMES DAILY, Disp: 60 mL, Rfl: 0 .  ondansetron (ZOFRAN) 8 MG tablet, Take 1 tablet (8 mg total) by mouth every 8 (eight) hours as needed for nausea or vomiting., Disp: 30 tablet, Rfl: 0 .  anastrozole (ARIMIDEX) 1 MG tablet, TAKE ONE TABLET BY MOUTH EVERY DAY (Patient not taking: Reported on 05/18/2018), Disp: 90 tablet, Rfl: 3 .  CALCIUM-MAGNESIUM-VITAMIN D ER PO, Take 1 tablet by mouth daily., Disp: , Rfl:  .  LORazepam (ATIVAN) 0.5 MG tablet, Take 1 tablet (0.5 mg total) by mouth every 6 (six) hours as needed (nausea and vomiting). (Patient not taking: Reported on 02/27/2018), Disp: 30 tablet, Rfl: 0 .  Multiple Vitamin (MULTIVITAMIN) capsule, Take 1 capsule by mouth daily., Disp: , Rfl:  .  Omega-3 Krill Oil 500 MG CAPS, Take 1,000 mg by mouth daily. , Disp: , Rfl:  .  predniSONE (DELTASONE) 50 MG tablet, Take 2 tablets (100 mg total) by mouth daily with breakfast. Take on day 1-5 of chemotherapy  With each treatment cycle (Patient not taking: Reported on 03/20/2018), Disp: 40 tablet, Rfl: 0 .  prochlorperazine (COMPAZINE) 10 MG tablet, Take 1 tablet (10 mg total) by mouth every 6 (six) hours as needed for nausea or vomiting. (Patient not taking: Reported on 02/27/2018), Disp: 30 tablet, Rfl: 3 .   sucralfate (CARAFATE) 1 g tablet, Take 1 tablet (1 g total) by mouth 3 (three) times daily before meals. (Patient not taking: Reported on 05/18/2018), Disp: 90 tablet, Rfl: 3 No current facility-administered medications for this visit.   Facility-Administered Medications Ordered in Other Visits:  .  0.9 %  sodium chloride infusion, ,  Intravenous, Once, Verlon Au, NP .  heparin lock flush 100 unit/mL, 500 Units, Intravenous, Once, Sindy Guadeloupe, MD  Physical exam:  Vitals:   05/26/18 1028  BP: 111/70  Pulse: 83  Resp: 18  Temp: (!) 96.7 F (35.9 C)  TempSrc: Tympanic   Physical Exam  Constitutional: She is oriented to person, place, and time. Vital signs are normal.  Accompanied. NAD. In exam room, in recliner.   HENT:  Head: Normocephalic and atraumatic.  White spots and patches across tongue  Eyes: Pupils are equal, round, and reactive to light.  Neck: Normal range of motion.  Cardiovascular: Normal rate, regular rhythm and normal heart sounds.  No murmur heard. Pulmonary/Chest: Effort normal and breath sounds normal. She has no wheezes.  Abdominal: Soft. Normal appearance and bowel sounds are normal. She exhibits no distension. There is no tenderness.  Musculoskeletal: Normal range of motion. She exhibits no edema.  Neurological: She is alert and oriented to person, place, and time.  Skin: Skin is warm and dry. No rash noted. There is erythema (Across chest from radiation).  Psychiatric: Judgment normal.     CMP Latest Ref Rng & Units 05/26/2018  Glucose 65 - 99 mg/dL 83  BUN 6 - 20 mg/dL 7  Creatinine 0.44 - 1.00 mg/dL 0.65  Sodium 135 - 145 mmol/L 137  Potassium 3.5 - 5.1 mmol/L 3.3(L)  Chloride 101 - 111 mmol/L 98(L)  CO2 22 - 32 mmol/L 25  Calcium 8.9 - 10.3 mg/dL 10.0  Total Protein 6.5 - 8.1 g/dL 7.1  Total Bilirubin 0.3 - 1.2 mg/dL 2.3(H)  Alkaline Phos 38 - 126 U/L 79  AST 15 - 41 U/L 38  ALT 14 - 54 U/L 45   CBC Latest Ref Rng & Units 05/26/2018  WBC  3.6 - 11.0 K/uL 4.5  Hemoglobin 12.0 - 16.0 g/dL 14.4  Hematocrit 35.0 - 47.0 % 40.7  Platelets 150 - 440 K/uL 149(L)    No images are attached to the encounter.  Dg Chest 2 View  Result Date: 05/08/2018 CLINICAL DATA:  Cough. EXAM: CHEST - 2 VIEW COMPARISON:  Radiographs of February 01, 2014. FINDINGS: The heart size and mediastinal contours are within normal limits. Both lungs are clear. No pneumothorax or pleural effusion is noted. Interval placement of right internal jugular Port-A-Cath with distal tip in expected position of SVC. The visualized skeletal structures are unremarkable. IMPRESSION: No active cardiopulmonary disease. Electronically Signed   By: Marijo Conception, M.D.   On: 05/08/2018 12:28   US Abdomen Limited Ruq  Result Date: 05/18/2018 CLINICAL DATA:  Hyperbilirubinemia.  Diffuse large B-cell lymphoma EXAM: ULTRASOUND ABDOMEN LIMITED RIGHT UPPER QUADRANT COMPARISON:  PET-CT 03/19/2018 FINDINGS: Gallbladder: Surgically absent Common bile duct: Diameter: 9 mm, stable at the hepatic hilum on prior PET-CT. Where visualized, no filling defect. Liver: No focal lesion identified. Within normal limits in parenchymal echogenicity. Portal vein is patent on color Doppler imaging with normal direction of blood flow towards the liver. IMPRESSION: No definite cause for the history. There is mild common bile duct dilatation but stable from recent PET CT and often physiologic after cholecystectomy. Electronically Signed   By: Monte Fantasia M.D.   On: 05/18/2018 12:35     Assessment and plan- Patient is a 61 y.o. female who presents for complaints of mouth sores and dehydration.   1.  Stage II E DLBCL- s/p 3 cycles of R-CHOP chemotherapy. Completed field radiation to the neck on 05/20/2018.  Scheduled to  follow-up with Dr.Rao June 2019.  2. Dehydration-poor oral intake.  Followed by Jennet Maduro, RD.  1L NaCl in clinic today.    3. Thrush-difficulty swallowing has improved.  White patches on  tongue consistent with thrush.  Previously on nystatin and Magic mouthwash with lidocaine which she continues to use.  Has not been taking Carafate.  Will give Diflucan 150 mg x 2 doses today.   4. Elevated Total Bilirubin-abdominal ultrasound revealed no acute abnormality.  Stable mild dilatation of common bile duct consistent with prior cholecystectomy. Bili 2.3 today. LFTs unremarkable otherwise. Asymptomatic. Re-check labs in 2 weeks.   5. Decreased potassium level - likely related to poor oral intake. Encouraged intake of potassium rich foods. Continue to monitor.   Visit Diagnosis 1. Diffuse large B-cell lymphoma of lymph nodes of neck (HCC)   2. Dehydration   3. Oral thrush   4. Hyperbilirubinemia     Patient expressed understanding and was in agreement with this plan. She also understands that She can call clinic at any time with any questions, concerns, or complaints.    Beckey Rutter, DNP, AGNP-C [place] [facility] [phone] (work cell) [phone] (office) 05/26/18 11:48 AM

## 2018-05-26 NOTE — Telephone Encounter (Signed)
Ping for appointment for IV fluids, she has an appointment at 10 AM for lab already. Appointment added for NP and IV fluids  Patient informed of appts and accepts

## 2018-06-01 ENCOUNTER — Telehealth: Payer: Self-pay

## 2018-06-01 NOTE — Telephone Encounter (Signed)
Nutrition  Called patient for nutrition follow-up.  Left message on cell phone to return call.  Maryela Tapper B. [name], [place], [place] Registered Dietitian [phone] (pager)

## 2018-06-04 ENCOUNTER — Telehealth: Payer: Self-pay

## 2018-06-04 NOTE — Telephone Encounter (Signed)
Nutrition  Patient returned call and left message on RD voicemail saying that she is eating better, feeling better.  Eating some puree foods and tolerating.  Appreciative of RD call and will contact if further nutrition questions or concerns.    Victoria Benson B. [name], [place], [place] Registered Dietitian [phone] (pager)

## 2018-06-09 ENCOUNTER — Inpatient Hospital Stay: Payer: 59 | Attending: Oncology

## 2018-06-09 DIAGNOSIS — Z923 Personal history of irradiation: Secondary | ICD-10-CM | POA: Diagnosis not present

## 2018-06-09 DIAGNOSIS — Z85828 Personal history of other malignant neoplasm of skin: Secondary | ICD-10-CM | POA: Diagnosis not present

## 2018-06-09 DIAGNOSIS — Z79899 Other long term (current) drug therapy: Secondary | ICD-10-CM | POA: Insufficient documentation

## 2018-06-09 DIAGNOSIS — R1310 Dysphagia, unspecified: Secondary | ICD-10-CM | POA: Diagnosis not present

## 2018-06-09 DIAGNOSIS — C8331 Diffuse large B-cell lymphoma, lymph nodes of head, face, and neck: Secondary | ICD-10-CM

## 2018-06-09 DIAGNOSIS — Z8041 Family history of malignant neoplasm of ovary: Secondary | ICD-10-CM | POA: Diagnosis not present

## 2018-06-09 DIAGNOSIS — Z853 Personal history of malignant neoplasm of breast: Secondary | ICD-10-CM | POA: Insufficient documentation

## 2018-06-09 DIAGNOSIS — D702 Other drug-induced agranulocytosis: Secondary | ICD-10-CM | POA: Insufficient documentation

## 2018-06-09 DIAGNOSIS — R221 Localized swelling, mass and lump, neck: Secondary | ICD-10-CM | POA: Insufficient documentation

## 2018-06-09 DIAGNOSIS — Z801 Family history of malignant neoplasm of trachea, bronchus and lung: Secondary | ICD-10-CM | POA: Insufficient documentation

## 2018-06-09 DIAGNOSIS — R011 Cardiac murmur, unspecified: Secondary | ICD-10-CM | POA: Insufficient documentation

## 2018-06-09 LAB — CBC WITH DIFFERENTIAL/PLATELET
BASOS PCT: 1 %
Basophils Absolute: 0 10*3/uL (ref 0–0.1)
Eosinophils Absolute: 0.1 10*3/uL (ref 0–0.7)
Eosinophils Relative: 2 %
HEMATOCRIT: 37.4 % (ref 35.0–47.0)
Hemoglobin: 13 g/dL (ref 12.0–16.0)
Lymphocytes Relative: 23 %
Lymphs Abs: 0.6 10*3/uL — ABNORMAL LOW (ref 1.0–3.6)
MCH: 29.2 pg (ref 26.0–34.0)
MCHC: 34.8 g/dL (ref 32.0–36.0)
MCV: 83.7 fL (ref 80.0–100.0)
MONO ABS: 0.5 10*3/uL (ref 0.2–0.9)
Monocytes Relative: 20 %
NEUTROS ABS: 1.3 10*3/uL — AB (ref 1.4–6.5)
NEUTROS PCT: 54 %
PLATELETS: 133 10*3/uL — AB (ref 150–440)
RBC: 4.47 MIL/uL (ref 3.80–5.20)
RDW: 13.7 % (ref 11.5–14.5)
WBC: 2.4 10*3/uL — AB (ref 3.6–11.0)

## 2018-06-09 LAB — COMPREHENSIVE METABOLIC PANEL
ALK PHOS: 74 U/L (ref 38–126)
ALT: 38 U/L (ref 14–54)
ANION GAP: 7 (ref 5–15)
AST: 41 U/L (ref 15–41)
Albumin: 4 g/dL (ref 3.5–5.0)
BILIRUBIN TOTAL: 1 mg/dL (ref 0.3–1.2)
BUN: <5 mg/dL — ABNORMAL LOW (ref 6–20)
CALCIUM: 10 mg/dL (ref 8.9–10.3)
CO2: 30 mmol/L (ref 22–32)
Chloride: 105 mmol/L (ref 101–111)
Creatinine, Ser: 0.68 mg/dL (ref 0.44–1.00)
GLUCOSE: 108 mg/dL — AB (ref 65–99)
Potassium: 4.2 mmol/L (ref 3.5–5.1)
Sodium: 142 mmol/L (ref 135–145)
TOTAL PROTEIN: 6.4 g/dL — AB (ref 6.5–8.1)

## 2018-06-11 ENCOUNTER — Ambulatory Visit
Admission: RE | Admit: 2018-06-11 | Discharge: 2018-06-11 | Disposition: A | Discharge status: Home / Self Care | Payer: 59 | Source: Ambulatory Visit | Attending: General Surgery | Admitting: General Surgery

## 2018-06-11 DIAGNOSIS — Z170 Estrogen receptor positive status [ER+]: Secondary | ICD-10-CM | POA: Diagnosis present

## 2018-06-11 DIAGNOSIS — C50211 Malignant neoplasm of upper-inner quadrant of right female breast: Secondary | ICD-10-CM

## 2018-06-11 DIAGNOSIS — Z853 Personal history of malignant neoplasm of breast: Secondary | ICD-10-CM | POA: Diagnosis not present

## 2018-06-16 ENCOUNTER — Encounter: Payer: Self-pay | Admitting: General Surgery

## 2018-06-16 ENCOUNTER — Ambulatory Visit (INDEPENDENT_AMBULATORY_CARE_PROVIDER_SITE_OTHER): Payer: 59 | Admitting: General Surgery

## 2018-06-16 VITALS — BP 124/74 | HR 78 | Resp 14 | Ht 63.9 in | Wt 165.0 lb

## 2018-06-16 DIAGNOSIS — C50211 Malignant neoplasm of upper-inner quadrant of right female breast: Secondary | ICD-10-CM | POA: Diagnosis not present

## 2018-06-16 DIAGNOSIS — Z170 Estrogen receptor positive status [ER+]: Secondary | ICD-10-CM | POA: Diagnosis not present

## 2018-06-16 DIAGNOSIS — Z853 Personal history of malignant neoplasm of breast: Secondary | ICD-10-CM | POA: Diagnosis not present

## 2018-06-16 NOTE — Patient Instructions (Addendum)
The patient is aware to call back for any questions or concerns.  Patient will be asked to return to the office in one year with a bilateral diagnostic mammogram.

## 2018-06-16 NOTE — Progress Notes (Signed)
Patient ID: Victoria Benson, female   DOB: 11-12-1956, 62 y.o.   MRN: [id number]  Chief Complaint  Patient presents with  . Follow-up    HPI Victoria Benson is a 62 y.o. female.  who presents for her follow up right breast cancer and a breast evaluation. The most recent mammogram was done on 06-11-18.  Patient does perform regular self breast checks and gets regular mammograms done.  She completed the radiation at the end of May. She lost about 20 pounds due to the radiation to the neck. Her eating habits have slowly gotten better. No new breast issues.  HPI  Past Medical History:  Diagnosis Date  . BRCA gene mutation negative 07/10/2016  . Breast cancer ([place]) 2009   T1c (1.2 cm) N0 ER 90%, PR 90%, HER-2/neu not amplified. Oncotype DX score, low risk (14) 9% chance of recurrence with antiestrogen therapy alone.  . Breast cancer ([place]) 06-17-16   INVASIVE MAMMARY CARCINOMA . T1, ER positive, PR positive, HER-2/neu not overexpressed  . Diffuse large B-cell lymphoma ([place]) 11/2017   Right cervical adenopathy  . Family history of adverse reaction to anesthesia    PTS MOM-HARD TO WAKE UP  . Heart murmur   . Radiation 2009   BREAST CA  . Skin cancer     Past Surgical History:  Procedure Laterality Date  . BREAST BIOPSY Right 06/17/2016   invasive. T1, ER positive, PR positive, HER-2/neu not overexpressed  . BREAST CYST ASPIRATION Left 2002  . BREAST EXCISIONAL BIOPSY Right 06/2016   lumpectomy with rad   . BREAST EXCISIONAL BIOPSY Right 2009   breast ca + mammo site radiation  . BREAST LUMPECTOMY WITH NEEDLE LOCALIZATION Right 07/18/2016   Procedure: BREAST LUMPECTOMY WITH NEEDLE LOCALIZATION AND RIGHT AXILLARY EXPLORATION;  Surgeon: Robert Bellow, MD;  Location: [facility] ORS;  Service: General;  Laterality: Right;  . BREAST SURGERY Right July 2009   Wide excision, sentinel left node biopsy MammoSite. ( 06/21/2008  . CHOLECYSTECTOMY  1984  . DILATION AND CURETTAGE OF UTERUS  2004  .  ENDOMETRIAL ABLATION  2005  . IR FLUORO GUIDE PORT INSERTION RIGHT  01/15/2018  . TUMOR REMOVAL  12/2013   from uterus done by [facility]    Family History  Problem Relation Age of Onset  . CAD Mother   . Osteoporosis Mother   . Lung cancer Father   . Diabetes Father   . Hypertension Brother   . Ovarian cancer Maternal Grandmother   . Heart attack Paternal Grandmother   . Diabetes Paternal Grandfather   . Fibromyalgia Brother   . Hypertension Brother   . Hypertension Brother   . Sleep apnea Brother   . Healthy Brother   . Sleep apnea Brother   . Ovarian cancer Other   . Breast cancer Neg Hx     Social History Social History   Tobacco Use  . Smoking status: Never Smoker  . Smokeless tobacco: Never Used  Substance Use Topics  . Alcohol use: No  . Drug use: No    No Known Allergies  Current Outpatient Medications  Medication Sig Dispense Refill  . anastrozole (ARIMIDEX) 1 MG tablet TAKE ONE TABLET BY MOUTH EVERY DAY 90 tablet 3  . CALCIUM-MAGNESIUM-VITAMIN D ER PO Take 1 tablet by mouth daily.    . fluticasone (FLONASE) 50 MCG/ACT nasal spray Place 2 sprays daily into both nostrils. 16 g 6  . Multiple Vitamin (MULTIVITAMIN) capsule Take 1 capsule by mouth daily.    Marland Kitchen  Omega-3 Krill Oil 500 MG CAPS Take 1,000 mg by mouth daily.     . cetirizine (ZYRTEC) 10 MG tablet Take 10 mg daily by mouth.    . lidocaine-prilocaine (EMLA) cream Apply to affected area once (Patient not taking: Reported on 06/16/2018) 30 g 3   No current facility-administered medications for this visit.     Review of Systems Review of Systems  Constitutional: Negative.   Respiratory: Negative.   Cardiovascular: Negative.     Blood pressure 124/74, pulse 78, resp. rate 14, height 5' 3.9" (1.623 m), weight 165 lb (74.8 kg), SpO2 98 %. The patient's weight is down 23 pounds from her June 2018 exam. Physical Exam Physical Exam  Constitutional: She is oriented to person, place, and time. She  appears well-developed and well-nourished.  HENT:  Mouth/Throat: Oropharynx is clear and moist.  Eyes: Conjunctivae are normal. No scleral icterus.  Neck: Neck supple.  Radiation color changes to the neck  Cardiovascular: Normal rate, regular rhythm and normal heart sounds.  Pulmonary/Chest: Effort normal and breath sounds normal. Right breast exhibits no inverted nipple, no mass, no nipple discharge, no skin change and no tenderness. Left breast exhibits no inverted nipple, no mass, no nipple discharge, no skin change and no tenderness.  Right lumpectomy incision well healed. Thickening right breast    Lymphadenopathy:    She has no cervical adenopathy.    She has no axillary adenopathy.  Neurological: She is alert and oriented to person, place, and time.  Skin: Skin is warm and dry.  Psychiatric: Her behavior is normal.    Data Reviewed June 11, 2018 bilateral diagnostic mammograms reviewed.  Postsurgical change.  BI-RADS-2.  Assessment    Prominent tissue in the upper outer quadrant of the right breast accentuated by recent weight loss.  Overall stable.    Plan    Patient will be asked to return to the office in one year with a bilateral diagnostic mammogram. Discussed port removal, pending order from Dr Janese Banks.      HPI, Physical Exam, Assessment and Plan have been scribed under the direction and in the presence of Robert Bellow, MD. Karie Fetch, RN I have completed the exam and reviewed the above documentation for accuracy and completeness.  I agree with the above.  Haematologist has been used and any errors in dictation or transcription are unintentional.  Hervey Ard, M.D., F.A.C.S.  Forest Gleason Neema Barreira 06/16/2018, 3:58 PM

## 2018-06-19 ENCOUNTER — Inpatient Hospital Stay (HOSPITAL_BASED_OUTPATIENT_CLINIC_OR_DEPARTMENT_OTHER): Payer: 59 | Admitting: Oncology

## 2018-06-19 ENCOUNTER — Inpatient Hospital Stay: Payer: 59

## 2018-06-19 ENCOUNTER — Encounter: Payer: Self-pay | Admitting: Oncology

## 2018-06-19 VITALS — BP 109/65 | HR 82 | Temp 97.7°F | Resp 18 | Ht 63.0 in | Wt 163.8 lb

## 2018-06-19 DIAGNOSIS — R221 Localized swelling, mass and lump, neck: Secondary | ICD-10-CM

## 2018-06-19 DIAGNOSIS — R1310 Dysphagia, unspecified: Secondary | ICD-10-CM | POA: Diagnosis not present

## 2018-06-19 DIAGNOSIS — Z853 Personal history of malignant neoplasm of breast: Secondary | ICD-10-CM | POA: Diagnosis not present

## 2018-06-19 DIAGNOSIS — Z85828 Personal history of other malignant neoplasm of skin: Secondary | ICD-10-CM

## 2018-06-19 DIAGNOSIS — Z08 Encounter for follow-up examination after completed treatment for malignant neoplasm: Secondary | ICD-10-CM

## 2018-06-19 DIAGNOSIS — D702 Other drug-induced agranulocytosis: Secondary | ICD-10-CM | POA: Diagnosis not present

## 2018-06-19 DIAGNOSIS — Z923 Personal history of irradiation: Secondary | ICD-10-CM

## 2018-06-19 DIAGNOSIS — Z79899 Other long term (current) drug therapy: Secondary | ICD-10-CM | POA: Diagnosis not present

## 2018-06-19 DIAGNOSIS — R011 Cardiac murmur, unspecified: Secondary | ICD-10-CM | POA: Diagnosis not present

## 2018-06-19 DIAGNOSIS — Z8579 Personal history of other malignant neoplasms of lymphoid, hematopoietic and related tissues: Secondary | ICD-10-CM

## 2018-06-19 DIAGNOSIS — Z801 Family history of malignant neoplasm of trachea, bronchus and lung: Secondary | ICD-10-CM | POA: Diagnosis not present

## 2018-06-19 DIAGNOSIS — C8331 Diffuse large B-cell lymphoma, lymph nodes of head, face, and neck: Secondary | ICD-10-CM

## 2018-06-19 DIAGNOSIS — Z8041 Family history of malignant neoplasm of ovary: Secondary | ICD-10-CM

## 2018-06-19 LAB — CBC WITH DIFFERENTIAL/PLATELET
BASOS ABS: 0 10*3/uL (ref 0–0.1)
BASOS PCT: 1 %
Eosinophils Absolute: 0.1 10*3/uL (ref 0–0.7)
Eosinophils Relative: 2 %
HEMATOCRIT: 39.5 % (ref 35.0–47.0)
Hemoglobin: 13.7 g/dL (ref 12.0–16.0)
LYMPHS PCT: 20 %
Lymphs Abs: 0.4 10*3/uL — ABNORMAL LOW (ref 1.0–3.6)
MCH: 29.4 pg (ref 26.0–34.0)
MCHC: 34.6 g/dL (ref 32.0–36.0)
MCV: 84.9 fL (ref 80.0–100.0)
Monocytes Absolute: 0.4 10*3/uL (ref 0.2–0.9)
Monocytes Relative: 20 %
NEUTROS ABS: 1.3 10*3/uL — AB (ref 1.4–6.5)
NEUTROS PCT: 57 %
Platelets: 135 10*3/uL — ABNORMAL LOW (ref 150–440)
RBC: 4.65 MIL/uL (ref 3.80–5.20)
RDW: 14.4 % (ref 11.5–14.5)
WBC: 2.2 10*3/uL — AB (ref 3.6–11.0)

## 2018-06-19 LAB — COMPREHENSIVE METABOLIC PANEL
ALBUMIN: 4.3 g/dL (ref 3.5–5.0)
ALT: 34 U/L (ref 14–54)
AST: 32 U/L (ref 15–41)
Alkaline Phosphatase: 85 U/L (ref 38–126)
Anion gap: 7 (ref 5–15)
BILIRUBIN TOTAL: 0.8 mg/dL (ref 0.3–1.2)
BUN: 7 mg/dL (ref 6–20)
CALCIUM: 10.4 mg/dL — AB (ref 8.9–10.3)
CO2: 27 mmol/L (ref 22–32)
Chloride: 106 mmol/L (ref 101–111)
Creatinine, Ser: 0.6 mg/dL (ref 0.44–1.00)
Glucose, Bld: 101 mg/dL — ABNORMAL HIGH (ref 65–99)
POTASSIUM: 4.7 mmol/L (ref 3.5–5.1)
Sodium: 140 mmol/L (ref 135–145)
TOTAL PROTEIN: 6.9 g/dL (ref 6.5–8.1)

## 2018-06-19 NOTE — Progress Notes (Signed)
No new changes noted today 

## 2018-06-19 NOTE — Progress Notes (Signed)
Hematology/Oncology Consult note [facility]  Telephone:([phone] [phone]  Patient Care Team: Virginia Crews, MD as PCP - General (Family Medicine) Margarita Rana, MD as Referring Physician (Family Medicine) Bary Castilla Forest Gleason, MD (General Surgery)   Name of the patient: Victoria Benson  [id number]  19-Nov-1956   Date of visit: 06/19/18  Diagnosis- Stage II E DLBCL. ABCsubtype. Not double hit. S/p 3 cycles of RCHOP  Chief complaint/ Reason for visit- routine f/u of DLBCL  Heme/Onc history: patient is a 62 year old female who developed symptoms of upper respiratory infection and nasal congestion sometime in November 2018. Around the same time she also developed a right neck mass which was initially attributed to possible tonsillitis and she was given antibiotics for the same. However her right neck mass did not decrease in size and she eventually saw Dr. Pryor Ochoa from ENT. Patient was found to have significant right cervical adenopathy and underwent CT of the soft tissue neck which showed right level 2 and 3 adenopathy favoring metastatic carcinoma. Jugulodigastric node measuring up to 3.7 cm. Asymmetric fullness of the right tonsillar fossa possibly primary site.  Patient underwent core biopsy of the right cervical lymph node.Pathology showed diffuse large B-cell lymphoma of the ABC subtype. IHC was positive for BCL-2 and BCL 6 see Mick IHC was not tested due to insufficiency of the testing sample. Ki-67 was high 80-90%.  Patient reports feeling well prior to November 2018. Currently she denies any fevers, chills, unintentional weight loss or drenching night sweats. She reports some difficulty swallowing because of the right neck mass but denies any shortness of breath. She did receive 1 dose of 100 mg prednisone yesterday and reports that her mass feels smaller and swallowing is easier.  PET/CT scan showed hypermetabolic them in the  tonsillar fossa bilaterally right greater than left as well as hypermetabolic lymphadenopathy in the right neck at the level 2 and level 3. No evidence of contralateral cervical adenopathy and no evidence of metastatic disease elsewhere  Bone marrow biopsy showed no evidence of lymphoma. IPI score 0- low risk. Does not meet criteria for CNS prophylaxis  Patient finished 3 cycles of RCHOP on 02/27/18.PET showed no active disease. She then completed IFRT  Interval history- Patient had issues with mucositis, difficulty swallowing and weight loss following radiation. She is slowly recovering from that. Still reports ry motuh. Appetite is slowly improving and food is tasting better but not back to baseline  ECOG PS- 1 Pain scale- 0  Review of systems- Review of Systems  Constitutional: Positive for malaise/fatigue. Negative for chills, fever and weight loss.  HENT: Negative for congestion, ear discharge and nosebleeds.        Dry mouth  Eyes: Negative for blurred vision.  Respiratory: Negative for cough, hemoptysis, sputum production, shortness of breath and wheezing.   Cardiovascular: Negative for chest pain, palpitations, orthopnea and claudication.  Gastrointestinal: Negative for abdominal pain, blood in stool, constipation, diarrhea, heartburn, melena, nausea and vomiting.  Genitourinary: Negative for dysuria, flank pain, frequency, hematuria and urgency.  Musculoskeletal: Negative for back pain, joint pain and myalgias.  Skin: Negative for rash.  Neurological: Negative for dizziness, tingling, focal weakness, seizures, weakness and headaches.  Endo/Heme/Allergies: Does not bruise/bleed easily.  Psychiatric/Behavioral: Negative for depression and suicidal ideas. The patient does not have insomnia.       No Known Allergies   Past Medical History:  Diagnosis Date  . BRCA gene mutation negative 07/10/2016  . Breast cancer (  [place]) 2009   T1c (1.2 cm) N0 ER 90%, PR 90%, HER-2/neu not  amplified. Oncotype DX score, low risk (14) 9% chance of recurrence with antiestrogen therapy alone.  . Breast cancer ([place]) 06-17-16   INVASIVE MAMMARY CARCINOMA . T1, ER positive, PR positive, HER-2/neu not overexpressed  . Diffuse large B-cell lymphoma ([place]) 11/2017   Right cervical adenopathy  . Family history of adverse reaction to anesthesia    PTS MOM-HARD TO WAKE UP  . Heart murmur   . Radiation 2009   BREAST CA  . Skin cancer      Past Surgical History:  Procedure Laterality Date  . BREAST BIOPSY Right 06/17/2016   invasive. T1, ER positive, PR positive, HER-2/neu not overexpressed  . BREAST CYST ASPIRATION Left 2002  . BREAST EXCISIONAL BIOPSY Right 06/2016   lumpectomy with rad   . BREAST EXCISIONAL BIOPSY Right 2009   breast ca + mammo site radiation  . BREAST LUMPECTOMY WITH NEEDLE LOCALIZATION Right 07/18/2016   Procedure: BREAST LUMPECTOMY WITH NEEDLE LOCALIZATION AND RIGHT AXILLARY EXPLORATION;  Surgeon: Robert Bellow, MD;  Location: [facility] ORS;  Service: General;  Laterality: Right;  . BREAST SURGERY Right July 2009   Wide excision, sentinel left node biopsy MammoSite. ( 06/21/2008  . CHOLECYSTECTOMY  1984  . DILATION AND CURETTAGE OF UTERUS  2004  . ENDOMETRIAL ABLATION  2005  . IR FLUORO GUIDE PORT INSERTION RIGHT  01/15/2018  . TUMOR REMOVAL  12/2013   from uterus done by [facility]    Social History   Socioeconomic History  . Marital status: Married    Spouse name: [name]  . Number of children: 2  . Years of education: 80  . Highest education level: Not on file  Occupational History  . Occupation: unemployed  Social Needs  . Financial resource strain: Not on file  . Food insecurity:    Worry: Not on file    Inability: Not on file  . Transportation needs:    Medical: Not on file    Non-medical: Not on file  Tobacco Use  . Smoking status: Never Smoker  . Smokeless tobacco: Never Used  Substance and Sexual Activity  . Alcohol use: No  . Drug  use: No  . Sexual activity: Yes  Lifestyle  . Physical activity:    Days per week: Not on file    Minutes per session: Not on file  . Stress: Not on file  Relationships  . Social connections:    Talks on phone: Not on file    Gets together: Not on file    Attends religious service: Not on file    Active member of club or organization: Not on file    Attends meetings of clubs or organizations: Not on file    Relationship status: Not on file  . Intimate partner violence:    Fear of current or ex partner: Not on file    Emotionally abused: Not on file    Physically abused: Not on file    Forced sexual activity: Not on file  Other Topics Concern  . Not on file  Social History Narrative  . Not on file    Family History  Problem Relation Age of Onset  . CAD Mother   . Osteoporosis Mother   . Lung cancer Father   . Diabetes Father   . Hypertension Brother   . Ovarian cancer Maternal Grandmother   . Heart attack Paternal Grandmother   . Diabetes Paternal Grandfather   .  Fibromyalgia Brother   . Hypertension Brother   . Hypertension Brother   . Sleep apnea Brother   . Healthy Brother   . Sleep apnea Brother   . Ovarian cancer Other   . Breast cancer Neg Hx      Current Outpatient Medications:  .  anastrozole (ARIMIDEX) 1 MG tablet, TAKE ONE TABLET BY MOUTH EVERY DAY, Disp: 90 tablet, Rfl: 3 .  CALCIUM-MAGNESIUM-VITAMIN D ER PO, Take 1 tablet by mouth daily., Disp: , Rfl:  .  fluticasone (FLONASE) 50 MCG/ACT nasal spray, Place 2 sprays daily into both nostrils., Disp: 16 g, Rfl: 6 .  Multiple Vitamin (MULTIVITAMIN) capsule, Take 1 capsule by mouth daily., Disp: , Rfl:  .  Omega-3 Krill Oil 500 MG CAPS, Take 1,000 mg by mouth daily. , Disp: , Rfl:  .  cetirizine (ZYRTEC) 10 MG tablet, Take 10 mg daily by mouth., Disp: , Rfl:   Physical exam:  Vitals:   06/19/18 1025  BP: 109/65  Pulse: 82  Resp: 18  Temp: 97.7 F (36.5 C)  TempSrc: Tympanic  SpO2: 98%  Weight:  163 lb 12.8 oz (74.3 kg)  Height: 5' 3"  (1.6 m)   Physical Exam  Constitutional: She is oriented to person, place, and time. She appears well-developed and well-nourished.  HENT:  Head: Normocephalic and atraumatic.  Eyes: Pupils are equal, round, and reactive to light. EOM are normal.  Neck: Normal range of motion.  Cardiovascular: Normal rate, regular rhythm and normal heart sounds.  Pulmonary/Chest: Effort normal and breath sounds normal.  Abdominal: Soft. Bowel sounds are normal.  Lymphadenopathy:  No palpable cervical, supraclavicular, axillary or inguinal adenopathy   Neurological: She is alert and oriented to person, place, and time.  Skin: Skin is warm and dry.     CMP Latest Ref Rng & Units 06/19/2018  Glucose 65 - 99 mg/dL 101(H)  BUN 6 - 20 mg/dL 7  Creatinine 0.44 - 1.00 mg/dL 0.60  Sodium 135 - 145 mmol/L 140  Potassium 3.5 - 5.1 mmol/L 4.7  Chloride 101 - 111 mmol/L 106  CO2 22 - 32 mmol/L 27  Calcium 8.9 - 10.3 mg/dL 10.4(H)  Total Protein 6.5 - 8.1 g/dL 6.9  Total Bilirubin 0.3 - 1.2 mg/dL 0.8  Alkaline Phos 38 - 126 U/L 85  AST 15 - 41 U/L 32  ALT 14 - 54 U/L 34   CBC Latest Ref Rng & Units 06/19/2018  WBC 3.6 - 11.0 K/uL 2.2(L)  Hemoglobin 12.0 - 16.0 g/dL 13.7  Hematocrit 35.0 - 47.0 % 39.5  Platelets 150 - 440 K/uL 135(L)    No images are attached to the encounter.  Mm Diag Breast Tomo Bilateral  Result Date: 06/11/2018 CLINICAL DATA:  62 year old patient with history of right breast cancer diagnosed in 2009. She was again diagnosed with a more medially positioned right breast cancer in 2017, and had a second lumpectomy of the right breast. Routine annual exam. EXAM: DIGITAL DIAGNOSTIC BILATERAL MAMMOGRAM WITH CAD AND TOMO COMPARISON:  Previous exam(s). ACR Breast Density Category b: There are scattered areas of fibroglandular density. FINDINGS: There are lumpectomy changes in the right breast. No mass, nonsurgical distortion, or suspicious  microcalcification is identified in either breast to suggest malignancy. Mammographic images were processed with CAD. IMPRESSION: No evidence of malignancy in either breast. RECOMMENDATION: Screening mammogram in one year.(Code:SM-B-01Y) I have discussed the findings and recommendations with the patient. Results were also provided in writing at the conclusion of the visit. If applicable,  a reminder letter will be sent to the patient regarding the next appointment. BI-RADS CATEGORY  2: Benign. Electronically Signed   By: Curlene Dolphin M.D.   On: 06/11/2018 14:13     Assessment and plan- Patient is a 62 y.o. female with Stage II E DLBCL abc subtypenot double hit. Low risk IPI score 0 s/p 3 cycles of RCHOP chemotherapy and IFRT. courrently under surveillance and here for routine f/u  Clinically patient is doing well. No evidence of recurrence on todays exam. She is still having some ongoing side effects of radiation treatment including dry mouth which should get better over next 4-6 weeks. No role for surveillance imaging. I will see her back in 3 months with cbc/ cmp and ldh.  We will touch base with Dr. Bary Castilla for port removal  Patient still has mild neutropenia likely a delayed side effect of rituxan which can persist for a while. Repeat cbc with diff in 6 weeks   Visit Diagnosis 1. Diffuse large B-cell lymphoma of lymph nodes of neck (HCC)   2. Encounter for follow-up surveillance of diffuse large B-cell lymphoma   3. Other drug-induced neutropenia ([place])      Dr. Randa Evens, MD, MPH [facility] [phone] 06/19/2018 1:01 PM

## 2018-06-22 ENCOUNTER — Telehealth: Payer: Self-pay | Admitting: *Deleted

## 2018-06-22 NOTE — Telephone Encounter (Signed)
I called pt and spoke to her about getting port out and I felt like from the patient and Dr. Janese Banks that Dr. Bary Castilla to take port out.  When I callled [facility] office last week, Sharyn Lull said as well as me that the port was put in from IR.  I made request to have it removed from IR. I then sent inbasket asking if he would like to take port out and he said yes. Patient would very much like him to take port out. I sent d/cport order to [facility] office and then cancelled the IR request and called and left message that I have cancelled it and take it off the list to schedule through IR,.    I told patient that if she does not hear from [facility] office to call them and she will call me if there are any issues

## 2018-06-25 ENCOUNTER — Ambulatory Visit: Payer: 59 | Admitting: Podiatry

## 2018-06-26 ENCOUNTER — Ambulatory Visit (INDEPENDENT_AMBULATORY_CARE_PROVIDER_SITE_OTHER): Payer: 59 | Admitting: Podiatry

## 2018-06-26 ENCOUNTER — Encounter: Payer: Self-pay | Admitting: Podiatry

## 2018-06-26 DIAGNOSIS — L601 Onycholysis: Secondary | ICD-10-CM | POA: Diagnosis not present

## 2018-06-29 NOTE — Progress Notes (Signed)
   Subjective: 62 year old female presenting today with a chief complaint of possible fungus to the nails of bilateral great toes that has been present for the past 2-3 months. She reports some mild bloody drainage from the toes. There are no modifying factors. She has been applying apple cider vinegar and peroxide for treatment. She denies pain. Patient is here for further evaluation and treatment.   Past Medical History:  Diagnosis Date  . BRCA gene mutation negative 07/10/2016  . Breast cancer ([place]) 2009   T1c (1.2 cm) N0 ER 90%, PR 90%, HER-2/neu not amplified. Oncotype DX score, low risk (14) 9% chance of recurrence with antiestrogen therapy alone.  . Breast cancer ([place]) 06-17-16   INVASIVE MAMMARY CARCINOMA . T1, ER positive, PR positive, HER-2/neu not overexpressed  . Diffuse large B-cell lymphoma ([place]) 11/2017   Right cervical adenopathy  . Family history of adverse reaction to anesthesia    PTS MOM-HARD TO WAKE UP  . Heart murmur   . Radiation 2009   BREAST CA  . Skin cancer     Objective: Physical Exam General: The patient is alert and oriented x3 in no acute distress.  Dermatology: Hyperkeratotic, discolored, thickened, onychodystrophy of great toenails noted bilaterally.  Skin is warm, dry and supple bilateral lower extremities. Negative for open lesions or macerations.  Vascular: Palpable pedal pulses bilaterally. No edema or erythema noted. Capillary refill within normal limits.  Neurological: Epicritic and protective threshold grossly intact bilaterally.   Musculoskeletal Exam: Range of motion within normal limits to all pedal and ankle joints bilateral. Muscle strength 5/5 in all groups bilateral.   Assessment: #1 dystrophic nails bilateral hallux secondary to shoe gear  Plan of Care:  #1 Patient was evaluated. #2 Mechanical debridement of great toenails bilaterally performed using a nail nipper. Filed with dremel without incident.  #3 Recommended changing shoe  gear to allow room in the toe box area.  #4 Return to clinic as needed.     Edrick Kins, DPM [facility]  Dr. Edrick Kins, [address]                                        [place], [place] [address]                Office [phone]  Fax [phone]

## 2018-07-01 ENCOUNTER — Ambulatory Visit: Payer: 59 | Admitting: Radiation Oncology

## 2018-07-06 ENCOUNTER — Telehealth: Payer: Self-pay

## 2018-07-06 NOTE — Telephone Encounter (Signed)
The patient left a message stating she would like to speak with someone about her appt on 9/13.I have contacted the pt at   [phone] . She like to move her appointment after 09/17/18. I have sent staff message to Shirlean Mylar in the [place] office. So she could give the patient a better time and date.

## 2018-07-16 ENCOUNTER — Inpatient Hospital Stay: Payer: 59 | Attending: Oncology

## 2018-07-16 ENCOUNTER — Other Ambulatory Visit: Payer: Self-pay

## 2018-07-16 ENCOUNTER — Encounter: Payer: Self-pay | Admitting: Radiation Oncology

## 2018-07-16 ENCOUNTER — Ambulatory Visit
Admission: RE | Admit: 2018-07-16 | Discharge: 2018-07-16 | Disposition: A | Discharge status: Home / Self Care | Payer: 59 | Source: Ambulatory Visit | Attending: Radiation Oncology | Admitting: Radiation Oncology

## 2018-07-16 VITALS — BP 96/65 | HR 75 | Temp 98.0°F | Wt 161.0 lb

## 2018-07-16 DIAGNOSIS — Z08 Encounter for follow-up examination after completed treatment for malignant neoplasm: Secondary | ICD-10-CM | POA: Insufficient documentation

## 2018-07-16 DIAGNOSIS — C8331 Diffuse large B-cell lymphoma, lymph nodes of head, face, and neck: Secondary | ICD-10-CM | POA: Insufficient documentation

## 2018-07-16 DIAGNOSIS — Z9221 Personal history of antineoplastic chemotherapy: Secondary | ICD-10-CM | POA: Insufficient documentation

## 2018-07-16 DIAGNOSIS — Z923 Personal history of irradiation: Secondary | ICD-10-CM | POA: Diagnosis not present

## 2018-07-16 NOTE — Progress Notes (Signed)
Radiation Oncology Follow up Note  Name: Victoria Benson   Date:   07/16/2018 MRN:  [id number] DOB: 15-Nov-1956    This 62 y.o. female presents to the clinic today for one-month follow-up status post involved field radiation therapy to the right neck for diffuse large B-cell lymphoma.  REFERRING PROVIDER: Virginia Crews, MD  HPI: patient is a 62 year old female now out 1 month having completed involved field radiation therapy to her neck for stageto a diffuse large B-cell lymphoma of the right neck status post R CHOP chemotherapy. She did have complete response by PET CT criteria. She is seen today in routine follow-up is doing well. She specifically denies head and neck pain or dysphagia.he's currently under surveillance. Herdry mouth has improved over the past several weeks.  COMPLICATIONS OF TREATMENT: none  FOLLOW UP COMPLIANCE: keeps appointments   PHYSICAL EXAM:  BP 96/65   Pulse 75   Temp 98 F (36.7 C)   Wt 161 lb 0.7 oz (73.1 kg)   BMI 28.53 kg/m  ral cavity is clear no oral mucosal lesions are identified. Indirect mirror examination shows upper airway clear vallecula and base of tongue within normal limits. Neck is clear without evidence of subject gastric cervical or supraclavicular adenopathy.Well-developed well-nourished patient in NAD. HEENT reveals PERLA, EOMI, discs not visualized.  Oral cavity is clear. No oral mucosal lesions are identified. Neck is clear without evidence of cervical or supraclavicular adenopathy. Lungs are clear to A&P. Cardiac examination is essentially unremarkable with regular rate and rhythm without murmur rub or thrill. Abdomen is benign with no organomegaly or masses noted. Motor sensory and DTR levels are equal and symmetric in the upper and lower extremities. Cranial nerves II through XII are grossly intact. Proprioception is intact. No peripheral adenopathy or edema is identified. No motor or sensory levels are noted. Crude visual fields are  within normal range.  RADIOLOGY RESULTS: no current films for review  PLAN: present time she is doing well. I'm please were overall progress. She's recovered from all of her radiation therapy side effects. I've asked to see her back in 4-5 months for follow-up. She continues close surveillance by medical oncology. Patient is to call with any concerns at any time.  I would like to take this opportunity to thank you for allowing me to participate in the care of your patient.Noreene Filbert, MD

## 2018-07-16 NOTE — Progress Notes (Unsigned)
Survivorship Care Plan visit completed.  Treatment summary reviewed and given to patient.  ASCO answers booklet reviewed and given to patient.  CARE program and Cancer Transitions discussed with patient along with other resources cancer center offers to patients and caregivers.  Patient verbalized understanding.    

## 2018-07-22 ENCOUNTER — Ambulatory Visit (INDEPENDENT_AMBULATORY_CARE_PROVIDER_SITE_OTHER): Payer: 59 | Admitting: Family Medicine

## 2018-07-22 ENCOUNTER — Encounter: Payer: Self-pay | Admitting: Family Medicine

## 2018-07-22 ENCOUNTER — Telehealth: Payer: Self-pay | Admitting: *Deleted

## 2018-07-22 VITALS — BP 132/78 | HR 73 | Temp 97.6°F | Resp 16 | Wt 157.0 lb

## 2018-07-22 DIAGNOSIS — Z1211 Encounter for screening for malignant neoplasm of colon: Secondary | ICD-10-CM | POA: Diagnosis not present

## 2018-07-22 DIAGNOSIS — R3915 Urgency of urination: Secondary | ICD-10-CM | POA: Diagnosis not present

## 2018-07-22 DIAGNOSIS — N3289 Other specified disorders of bladder: Secondary | ICD-10-CM

## 2018-07-22 DIAGNOSIS — L259 Unspecified contact dermatitis, unspecified cause: Secondary | ICD-10-CM

## 2018-07-22 LAB — POCT URINALYSIS DIPSTICK
Appearance: NORMAL
BILIRUBIN UA: NEGATIVE
Blood, UA: NEGATIVE
GLUCOSE UA: NEGATIVE
KETONES UA: NEGATIVE
Leukocytes, UA: NEGATIVE
Nitrite, UA: NEGATIVE
ODOR: NORMAL
PH UA: 6 (ref 5.0–8.0)
Protein, UA: NEGATIVE
Spec Grav, UA: 1.01 (ref 1.010–1.025)
Urobilinogen, UA: 0.2 E.U./dL

## 2018-07-22 MED ORDER — TRIAMCINOLONE ACETONIDE 0.5 % EX OINT: 1.0000 "application " | TOPICAL_OINTMENT | Freq: Two times a day (BID) | CUTANEOUS | 1 refills | Status: DC

## 2018-07-22 MED ORDER — MIRABEGRON ER 25 MG PO TB24: 25.0000 mg | ORAL_TABLET | Freq: Every day | ORAL | 2 refills | Status: DC

## 2018-07-22 NOTE — Progress Notes (Signed)
Patient: Victoria Benson Female    DOB: 03/03/1956   62 y.o.   MRN: [id number] Visit Date: 07/22/2018  Today's Provider: Lavon Paganini, MD   I, Martha Clan, CMA, am acting as scribe for Lavon Paganini, MD.  Chief Complaint  Patient presents with  . Skin Problem   Subjective:    HPI   Pt has noticed a sensitivity on bilateral upper arms. She also notes that she has red bumps near the area. Is sensitive to the touch. This is associated with chills. She had a recent tick bite in a different area. Denies nausea, vomiting, abdominal pain, fever.  Pt has also noticed urinary frequency and incontinence x 1 month. The onset was sudden.  She denies any dysuria, fever, abdominal pain, CVA tenderness, hematuria.  She states that when her feet hit the floor in the morning or she hears water or steps into the bathroom she will feel a sudden urge and need to urinate and will leak if she does not make it to the bathroom quickly.  She is never had anything like this before.  She is having nocturia x2-3 every night.  No Known Allergies   Current Outpatient Medications:  .  anastrozole (ARIMIDEX) 1 MG tablet, TAKE ONE TABLET BY MOUTH EVERY DAY, Disp: 90 tablet, Rfl: 3 .  CALCIUM-MAGNESIUM-VITAMIN D ER PO, Take 1 tablet by mouth daily., Disp: , Rfl:  .  cetirizine (ZYRTEC) 10 MG tablet, Take 10 mg daily by mouth., Disp: , Rfl:  .  fluticasone (FLONASE) 50 MCG/ACT nasal spray, Place 2 sprays daily into both nostrils., Disp: 16 g, Rfl: 6 .  Multiple Vitamin (MULTIVITAMIN) capsule, Take 1 capsule by mouth daily., Disp: , Rfl:  .  Omega-3 Krill Oil 500 MG CAPS, Take 1,000 mg by mouth daily. , Disp: , Rfl:  .  mirabegron ER (MYRBETRIQ) 25 MG TB24 tablet, Take 1 tablet (25 mg total) by mouth daily., Disp: 30 tablet, Rfl: 2 .  triamcinolone ointment (KENALOG) 0.5 %, Apply 1 application topically 2 (two) times daily., Disp: 60 g, Rfl: 1  Review of Systems  Constitutional: Positive for chills.  Negative for activity change, appetite change, diaphoresis, fatigue, fever and unexpected weight change.  Respiratory: Negative for shortness of breath.   Cardiovascular: Negative for chest pain, palpitations and leg swelling.  Gastrointestinal: Negative for abdominal pain, diarrhea, nausea and vomiting.  Genitourinary: Positive for frequency.  Musculoskeletal: Positive for arthralgias and myalgias.  Skin: Negative for color change and rash.    Social History   Tobacco Use  . Smoking status: Never Smoker  . Smokeless tobacco: Never Used  Substance Use Topics  . Alcohol use: No   Objective:   BP 132/78 (BP Location: Left Wrist, Patient Position: Sitting, Cuff Size: Normal)   Pulse 73   Temp 97.6 F (36.4 C) (Oral)   Resp 16   Wt 157 lb (71.2 kg)   SpO2 99%   BMI 27.81 kg/m  Vitals:   07/22/18 1028  BP: 132/78  Pulse: 73  Resp: 16  Temp: 97.6 F (36.4 C)  TempSrc: Oral  SpO2: 99%  Weight: 157 lb (71.2 kg)     Physical Exam  Constitutional: She is oriented to person, place, and time. She appears well-developed and well-nourished. No distress.  HENT:  Head: Normocephalic and atraumatic.  Right Ear: External ear normal.  Left Ear: External ear normal.  Nose: Nose normal.  Dry mouth  Eyes: Pupils are equal, round, and  reactive to light. Conjunctivae are normal. No scleral icterus.  Cardiovascular: Normal rate, regular rhythm, normal heart sounds and intact distal pulses.  No murmur heard. Pulmonary/Chest: Effort normal and breath sounds normal. No respiratory distress. She has no wheezes. She has no rales.  Abdominal: Soft. She exhibits no distension. There is no tenderness.  Musculoskeletal: She exhibits no edema.  Neurological: She is alert and oriented to person, place, and time.  Skin: Skin is warm and dry. Capillary refill takes less than 2 seconds. Rash (fine, erythematous papules over b/l upper arms under sleeves of shirt) noted.  Psychiatric: She has a normal  mood and affect. Her behavior is normal.  Vitals reviewed.    Results for orders placed or performed in visit on 07/22/18  POCT urinalysis dipstick  Result Value Ref Range   Color, UA yellow    Clarity, UA clear    Glucose, UA Negative Negative   Bilirubin, UA negative    Ketones, UA negative    Spec Grav, UA 1.010 1.010 - 1.025   Blood, UA negative    pH, UA 6.0 5.0 - 8.0   Protein, UA Negative Negative   Urobilinogen, UA 0.2 0.2 or 1.0 E.U./dL   Nitrite, UA negative    Leukocytes, UA Negative Negative   Appearance normal    Odor normal        Assessment & Plan:   1. Contact dermatitis, unspecified contact dermatitis type, unspecified trigger -Rash is consistent with likely contact dermatitis -Irritant or allergen is unknown -Treat with triamcinolone ointment twice daily until resolution -Discussed conservative measures, possible things to avoid, and return precautions  2. Bladder spasm 3. Urinary urgency -UA is clear without signs of infection or hematuria -The way patient describes her symptoms is similar to overactive bladder and bladder spasms -Do not think that she is an appropriate candidate for oxybutynin given significant dry mouth after her head and neck radiation -We will treat with Myrbetriq daily -Could consider dose titration in the future -Advised patient that we could need to fill out prior authorization for this medication - POCT urinalysis dipstick  4. Screen for colon cancer -Patient is due for colonoscopy and agrees to referral back to Dr. Allen Norris with gastroenterology today - Ambulatory referral to Gastroenterology    Meds ordered this encounter  Medications  . triamcinolone ointment (KENALOG) 0.5 %    Sig: Apply 1 application topically 2 (two) times daily.    Dispense:  60 g    Refill:  1  . mirabegron ER (MYRBETRIQ) 25 MG TB24 tablet    Sig: Take 1 tablet (25 mg total) by mouth daily.    Dispense:  30 tablet    Refill:  2     Return in  about 2 months (around 09/22/2018) for CPE.   The entirety of the information documented in the History of Present Illness, Review of Systems and Physical Exam were personally obtained by me. Portions of this information were initially documented by Raquel Sarna Ratchford, CMA and reviewed by me for thoroughness and accuracy.    Virginia Crews, MD, MPH [facility] 07/22/2018 11:41 AM,

## 2018-07-22 NOTE — Patient Instructions (Signed)

## 2018-07-22 NOTE — Telephone Encounter (Signed)
Patient called and states she has irritation of her upper arms bilaterally and in one shoulder blade times 5 days. She does not see anything on her skin. She said she has a burning sensation and feels chilled also.  She is asking if she should see her PCP for this or if we would see her. I discussed with Alease Medina,  NP who said she should see her PCP. Patient informed of this and states she will contact her PCP

## 2018-07-23 ENCOUNTER — Ambulatory Visit (INDEPENDENT_AMBULATORY_CARE_PROVIDER_SITE_OTHER): Payer: 59 | Admitting: General Surgery

## 2018-07-23 ENCOUNTER — Encounter: Payer: Self-pay | Admitting: General Surgery

## 2018-07-23 ENCOUNTER — Encounter: Payer: Self-pay | Admitting: *Deleted

## 2018-07-23 VITALS — BP 134/72 | HR 80 | Resp 14 | Ht 63.0 in | Wt 158.0 lb

## 2018-07-23 DIAGNOSIS — Z853 Personal history of malignant neoplasm of breast: Secondary | ICD-10-CM | POA: Diagnosis not present

## 2018-07-23 NOTE — Patient Instructions (Signed)
May shower May remove dressing in 2-3 days Steri strips will gradually come off over 2-3 weeks May use an Ice pack as needed for comfort  

## 2018-07-23 NOTE — Progress Notes (Signed)
Patient ID: Victoria Benson, female   DOB: 12-11-1956, 62 y.o.   MRN: [id number]  Chief Complaint  Patient presents with  . Procedure    HPI Victoria Benson is a 62 y.o. female here today for port removal .  She is here with her husband, [name].  HPI  Past Medical History:  Diagnosis Date  . BRCA gene mutation negative 07/10/2016  . Breast cancer ([place]) 2009   T1c (1.2 cm) N0 ER 90%, PR 90%, HER-2/neu not amplified. Oncotype DX score, low risk (14) 9% chance of recurrence with antiestrogen therapy alone.  . Breast cancer ([place]) 06-17-16   INVASIVE MAMMARY CARCINOMA . T1, ER positive, PR positive, HER-2/neu not overexpressed  . Diffuse large B-cell lymphoma ([place]) 11/2017   Right cervical adenopathy  . Family history of adverse reaction to anesthesia    PTS MOM-HARD TO WAKE UP  . Heart murmur   . Radiation 2009   BREAST CA  . Skin cancer     Past Surgical History:  Procedure Laterality Date  . BREAST BIOPSY Right 06/17/2016   invasive. T1, ER positive, PR positive, HER-2/neu not overexpressed  . BREAST CYST ASPIRATION Left 2002  . BREAST EXCISIONAL BIOPSY Right 06/2016   lumpectomy with rad   . BREAST EXCISIONAL BIOPSY Right 2009   breast ca + mammo site radiation  . BREAST LUMPECTOMY WITH NEEDLE LOCALIZATION Right 07/18/2016   Procedure: BREAST LUMPECTOMY WITH NEEDLE LOCALIZATION AND RIGHT AXILLARY EXPLORATION;  Surgeon: Robert Bellow, MD;  Location: [facility] ORS;  Service: General;  Laterality: Right;  . BREAST SURGERY Right July 2009   Wide excision, sentinel left node biopsy MammoSite. ( 06/21/2008  . CHOLECYSTECTOMY  1984  . DILATION AND CURETTAGE OF UTERUS  2004  . ENDOMETRIAL ABLATION  2005  . IR FLUORO GUIDE PORT INSERTION RIGHT  01/15/2018  . TUMOR REMOVAL  12/2013   from uterus done by [facility]    Family History  Problem Relation Age of Onset  . CAD Mother   . Osteoporosis Mother   . Lung cancer Father   . Diabetes Father   . Hypertension Brother   . Ovarian  cancer Maternal Grandmother   . Heart attack Paternal Grandmother   . Diabetes Paternal Grandfather   . Fibromyalgia Brother   . Hypertension Brother   . Hypertension Brother   . Sleep apnea Brother   . Healthy Brother   . Sleep apnea Brother   . Ovarian cancer Other   . Breast cancer Neg Hx     Social History Social History   Tobacco Use  . Smoking status: Never Smoker  . Smokeless tobacco: Never Used  Substance Use Topics  . Alcohol use: No  . Drug use: No    No Known Allergies  Current Outpatient Medications  Medication Sig Dispense Refill  . anastrozole (ARIMIDEX) 1 MG tablet TAKE ONE TABLET BY MOUTH EVERY DAY 90 tablet 3  . CALCIUM-MAGNESIUM-VITAMIN D ER PO Take 1 tablet by mouth daily.    . cetirizine (ZYRTEC) 10 MG tablet Take 10 mg daily by mouth.    . fluticasone (FLONASE) 50 MCG/ACT nasal spray Place 2 sprays daily into both nostrils. 16 g 6  . mirabegron ER (MYRBETRIQ) 25 MG TB24 tablet Take 1 tablet (25 mg total) by mouth daily. 30 tablet 2  . Multiple Vitamin (MULTIVITAMIN) capsule Take 1 capsule by mouth daily.    . Omega-3 Krill Oil 500 MG CAPS Take 1,000 mg by mouth daily.     Marland Kitchen  triamcinolone ointment (KENALOG) 0.5 % Apply 1 application topically 2 (two) times daily. 60 g 1   No current facility-administered medications for this visit.     Review of Systems Review of Systems  Constitutional: Negative.   Respiratory: Negative.   Cardiovascular: Negative.     Blood pressure 134/72, pulse 80, resp. rate 14, height 5' 3"  (1.6 m), weight 158 lb (71.7 kg).  Physical Exam Physical Exam  Constitutional: She is oriented to person, place, and time. She appears well-developed and well-nourished.  Neurological: She is alert and oriented to person, place, and time.  Skin: Skin is warm and dry.  Psychiatric: Her behavior is normal.    Data Reviewed The port site was clean without evidence of infection or induration.  10 cc of 0.5% Xylocaine with 0.25%  Marcaine with 1 to 200,000 units of epinephrine was utilized and well-tolerated.  ChloraPrep was applied to the skin.  The previous incision was opened and the port exposed.  No transfixion sutures were noted.  The port was extracted with an intact catheter tip.  The pocket was closed with a running 3-0 Vicryl suture.  The skin was closed with a running 4-0 Vicryl subcuticular suture.  Benzoin and Steri-Strips followed by Telfa and Tegaderm dressing applied.  Ice pack provided.  Assessment    Good tolerance of PowerPort removal.    Plan  Wound care reviewed.    HPI, Physical Exam, Assessment and Plan have been scribed under the direction and in the presence of Robert Bellow, MD. Karie Fetch, RN  I have completed the exam and reviewed the above documentation for accuracy and completeness.  I agree with the above.  Haematologist has been used and any errors in dictation or transcription are unintentional.  Hervey Ard, M.D., F.A.C.S.  Forest Gleason Jadriel Saxer 07/24/2018, 5:33 PM

## 2018-07-27 ENCOUNTER — Telehealth: Payer: Self-pay

## 2018-07-27 NOTE — Telephone Encounter (Signed)
-----   Message from Virginia Crews, MD sent at 07/27/2018  1:59 PM EDT ----- Mybetriq was declined.  Insurance requires trial of another medication for overactive bladder before considering coverage of Myrbetriq.  Do not want to try Oxybutinin due to dry mouth.  We can try Toviaz instead.  Dry mouth is not a listed side effect of this medication.  We can try this (4mg  daily) and if it does not work or causes side effects, insurance will reconsider Mybetriq.  OK to Rx if patient wants to give this a try.   ----- Message ----- From: Ola Spurr Sent: 07/27/2018  11:38 AM To: Virginia Crews, MD  Review incoming fax

## 2018-07-27 NOTE — Telephone Encounter (Signed)
Pt states she does not want to try any new medications at this time, due to the radiation. Pt will call back if she decides to try a new OAB med.

## 2018-07-30 ENCOUNTER — Inpatient Hospital Stay: Payer: Commercial Managed Care - HMO

## 2018-07-30 ENCOUNTER — Other Ambulatory Visit: Payer: Self-pay | Admitting: *Deleted

## 2018-07-30 ENCOUNTER — Other Ambulatory Visit: Payer: Self-pay

## 2018-07-30 ENCOUNTER — Encounter: Payer: Self-pay | Admitting: Radiation Oncology

## 2018-07-30 ENCOUNTER — Ambulatory Visit
Admission: RE | Admit: 2018-07-30 | Discharge: 2018-07-30 | Disposition: A | Discharge status: Home / Self Care | Payer: Commercial Managed Care - HMO | Source: Ambulatory Visit | Attending: Radiation Oncology | Admitting: Radiation Oncology

## 2018-07-30 VITALS — BP 121/66 | HR 90 | Temp 97.3°F | Resp 18 | Wt 154.8 lb

## 2018-07-30 DIAGNOSIS — Z923 Personal history of irradiation: Secondary | ICD-10-CM | POA: Diagnosis not present

## 2018-07-30 DIAGNOSIS — G5132 Clonic hemifacial spasm, left: Secondary | ICD-10-CM | POA: Insufficient documentation

## 2018-07-30 DIAGNOSIS — C8331 Diffuse large B-cell lymphoma, lymph nodes of head, face, and neck: Secondary | ICD-10-CM | POA: Insufficient documentation

## 2018-07-30 LAB — CBC WITH DIFFERENTIAL/PLATELET
BASOS ABS: 0 10*3/uL (ref 0–0.1)
BASOS PCT: 1 %
Eosinophils Absolute: 0.1 10*3/uL (ref 0–0.7)
Eosinophils Relative: 1 %
HCT: 41.8 % (ref 35.0–47.0)
HEMOGLOBIN: 14.2 g/dL (ref 12.0–16.0)
Lymphocytes Relative: 15 %
Lymphs Abs: 0.6 10*3/uL — ABNORMAL LOW (ref 1.0–3.6)
MCH: 29.5 pg (ref 26.0–34.0)
MCHC: 34 g/dL (ref 32.0–36.0)
MCV: 86.9 fL (ref 80.0–100.0)
MONOS PCT: 11 %
Monocytes Absolute: 0.5 10*3/uL (ref 0.2–0.9)
NEUTROS ABS: 2.9 10*3/uL (ref 1.4–6.5)
NEUTROS PCT: 72 %
Platelets: 161 10*3/uL (ref 150–440)
RBC: 4.82 MIL/uL (ref 3.80–5.20)
RDW: 14.1 % (ref 11.5–14.5)
WBC: 4 10*3/uL (ref 3.6–11.0)

## 2018-07-30 NOTE — Progress Notes (Signed)
Radiation Oncology Follow up Note  Name: Victoria Benson   Date:   07/30/2018 MRN:  [id number] DOB: September 02, 1956    This 62 y.o. female presents to the clinic today for follow-up having developedleft occipital spasms I.e. headaches.  REFERRING PROVIDER: Virginia Crews, MD  HPI: patient is a.62 year old female now out a little over a month having completed involved field radiation therapy to her right neck status post R CHOP for diffuse B-cell lymphoma. She's complaining lately of having significant left posterior occipital spasms I.e. Headaches of unknown etiology. There is no point tenderness in this area. She's been taking occasional Aleve.  COMPLICATIONS OF TREATMENT: none  FOLLOW UP COMPLIANCE: keeps appointments   PHYSICAL EXAM:  BP 121/66   Pulse 90   Temp (!) 97.3 F (36.3 C)   Resp 18   Wt 154 lb 12.2 oz (70.2 kg)   BMI 27.42 kg/m  Range of motion of her neck does not elicit pain deep palpation of her cranium does not elicit pain. Oral cavity is clear with no oral mucosal lesions are identified. No evidence of subject gastric cervical or supraclavicular adenopathy is noted.Well-developed well-nourished patient in NAD. HEENT reveals PERLA, EOMI, discs not visualized.  Oral cavity is clear. No oral mucosal lesions are identified. Neck is clear without evidence of cervical or supraclavicular adenopathy. Lungs are clear to A&P. Cardiac examination is essentially unremarkable with regular rate and rhythm without murmur rub or thrill. Abdomen is benign with no organomegaly or masses noted. Motor sensory and DTR levels are equal and symmetric in the upper and lower extremities. Cranial nerves II through XII are grossly intact. Proprioception is intact. No peripheral adenopathy or edema is identified. No motor or sensory levels are noted. Crude visual fields are within normal range.  RADIOLOGY RESULTS: CT scan of the head and neck has been ordered  PLAN: this time I'll review her CT scan  of her head and neck just to rule out any possible evidence of disease. I've asked her to usesome freezing type gelin this area when the pain persists and to use Advil when necessary. Will seek further medical advice for medical oncology and possibly neurology should these persist. I've set up a follow-up after her CT scan for that to be reviewed.  I would like to take this opportunity to thank you for allowing me to participate in the care of your patient.Noreene Filbert, MD

## 2018-07-31 ENCOUNTER — Inpatient Hospital Stay: Payer: 59

## 2018-08-06 ENCOUNTER — Ambulatory Visit
Admission: RE | Admit: 2018-08-06 | Discharge: 2018-08-06 | Disposition: A | Discharge status: Home / Self Care | Payer: Commercial Managed Care - HMO | Source: Ambulatory Visit | Attending: Radiation Oncology | Admitting: Radiation Oncology

## 2018-08-06 DIAGNOSIS — M47892 Other spondylosis, cervical region: Secondary | ICD-10-CM | POA: Insufficient documentation

## 2018-08-06 DIAGNOSIS — C8331 Diffuse large B-cell lymphoma, lymph nodes of head, face, and neck: Secondary | ICD-10-CM | POA: Diagnosis not present

## 2018-08-06 DIAGNOSIS — C8330 Diffuse large B-cell lymphoma, unspecified site: Secondary | ICD-10-CM | POA: Diagnosis not present

## 2018-08-06 LAB — POCT I-STAT CREATININE: Creatinine, Ser: 0.5 mg/dL (ref 0.44–1.00)

## 2018-08-06 MED ORDER — IOHEXOL 300 MG/ML  SOLN
75.0000 mL | Freq: Once | INTRAMUSCULAR | Status: AC | PRN
  Administered 2018-08-06: 75 mL via INTRAVENOUS

## 2018-08-12 ENCOUNTER — Encounter: Payer: Self-pay | Admitting: Radiation Oncology

## 2018-08-12 ENCOUNTER — Other Ambulatory Visit: Payer: Self-pay

## 2018-08-12 ENCOUNTER — Ambulatory Visit
Admission: RE | Admit: 2018-08-12 | Discharge: 2018-08-12 | Disposition: A | Discharge status: Home / Self Care | Payer: Commercial Managed Care - HMO | Source: Ambulatory Visit | Attending: Radiation Oncology | Admitting: Radiation Oncology

## 2018-08-12 DIAGNOSIS — Z923 Personal history of irradiation: Secondary | ICD-10-CM | POA: Insufficient documentation

## 2018-08-12 DIAGNOSIS — Z9221 Personal history of antineoplastic chemotherapy: Secondary | ICD-10-CM | POA: Insufficient documentation

## 2018-08-12 DIAGNOSIS — Z08 Encounter for follow-up examination after completed treatment for malignant neoplasm: Secondary | ICD-10-CM | POA: Diagnosis not present

## 2018-08-12 DIAGNOSIS — C8511 Unspecified B-cell lymphoma, lymph nodes of head, face, and neck: Secondary | ICD-10-CM | POA: Diagnosis not present

## 2018-08-12 DIAGNOSIS — C8331 Diffuse large B-cell lymphoma, lymph nodes of head, face, and neck: Secondary | ICD-10-CM

## 2018-08-12 NOTE — Progress Notes (Signed)
Radiation Oncology Follow up Note  Name: Victoria Benson   Date:   08/12/2018 MRN:  [id number] DOB: Aug 28, 1956    This 62 y.o. female presents to the clinic today for follow-up of CT scan of the head and neck inpatient out approximately 2 months having completed involved field radiation therapy to her right neckfor diffuse B-cell lymphoma status post R CHOP therapy.  REFERRING PROVIDER: Virginia Crews, MD  HPI: patient is a 62 year old female now out about approximate 2 months having completed involved field radiation therapy to her right neck for diffuse B-cell lymphoma status post R CHOP chemotherapy. She was having occipital headaches and I ran a CT scan which demonstrated no evidence of disease in the head and neck region.Her symptoms actually have markedly improved and she seen today without complaint. She's having no head and neck pain and no dysphagia..  COMPLICATIONS OF TREATMENT: none  FOLLOW UP COMPLIANCE: keeps appointments   PHYSICAL EXAM:  BP (P) 102/63 (BP Location: Left Arm, Patient Position: Sitting)   Pulse (P) 81   Temp (!) (P) 96 F (35.6 C) (Tympanic)   Resp (P) 18   Wt (P) 156 lb 6.7 oz (70.9 kg)   BMI (P) 27.71 kg/m  ral cavity is clear no oral mucosal lesions are identified.No evidence of subject gastric cervical or supraclavicular adenopathy is appreciated.Well-developed well-nourished patient in NAD. HEENT reveals PERLA, EOMI, discs not visualized.  Oral cavity is clear. No oral mucosal lesions are identified. Neck is clear without evidence of cervical or supraclavicular adenopathy. Lungs are clear to A&P. Cardiac examination is essentially unremarkable with regular rate and rhythm without murmur rub or thrill. Abdomen is benign with no organomegaly or masses noted. Motor sensory and DTR levels are equal and symmetric in the upper and lower extremities. Cranial nerves II through XII are grossly intact. Proprioception is intact. No peripheral adenopathy or edema is  identified. No motor or sensory levels are noted. Crude visual fields are within normal range.  RADIOLOGY RESULTS: cT scans are reviewed and compatible with the above-stated findings  PLAN: resent time patient is doing well improvingfrom her headaches status post radiation therapy to involved field right neck. I'm please were overall progress. CT scan is reassuring. I've asked to see her back in 3-4 months for follow-up. She knows to call anytime with any concerns.  I would like to take this opportunity to thank you for allowing me to participate in the care of your patient.Noreene Filbert, MD

## 2018-09-11 ENCOUNTER — Other Ambulatory Visit: Payer: 59

## 2018-09-11 ENCOUNTER — Ambulatory Visit: Payer: 59 | Admitting: Oncology

## 2018-09-22 ENCOUNTER — Other Ambulatory Visit: Payer: Self-pay

## 2018-09-22 ENCOUNTER — Inpatient Hospital Stay: Payer: Commercial Managed Care - HMO

## 2018-09-22 ENCOUNTER — Inpatient Hospital Stay: Payer: Commercial Managed Care - HMO | Attending: Oncology | Admitting: Oncology

## 2018-09-22 ENCOUNTER — Encounter: Payer: Self-pay | Admitting: Oncology

## 2018-09-22 VITALS — BP 103/69 | HR 74 | Temp 97.9°F | Resp 18 | Ht 63.0 in | Wt 153.3 lb

## 2018-09-22 DIAGNOSIS — Z08 Encounter for follow-up examination after completed treatment for malignant neoplasm: Secondary | ICD-10-CM

## 2018-09-22 DIAGNOSIS — Z801 Family history of malignant neoplasm of trachea, bronchus and lung: Secondary | ICD-10-CM | POA: Insufficient documentation

## 2018-09-22 DIAGNOSIS — Z853 Personal history of malignant neoplasm of breast: Secondary | ICD-10-CM | POA: Diagnosis not present

## 2018-09-22 DIAGNOSIS — Z8041 Family history of malignant neoplasm of ovary: Secondary | ICD-10-CM | POA: Diagnosis not present

## 2018-09-22 DIAGNOSIS — Z8579 Personal history of other malignant neoplasms of lymphoid, hematopoietic and related tissues: Secondary | ICD-10-CM | POA: Diagnosis not present

## 2018-09-22 DIAGNOSIS — R011 Cardiac murmur, unspecified: Secondary | ICD-10-CM

## 2018-09-22 DIAGNOSIS — Z85828 Personal history of other malignant neoplasm of skin: Secondary | ICD-10-CM | POA: Insufficient documentation

## 2018-09-22 DIAGNOSIS — Z9221 Personal history of antineoplastic chemotherapy: Secondary | ICD-10-CM | POA: Insufficient documentation

## 2018-09-22 DIAGNOSIS — Z923 Personal history of irradiation: Secondary | ICD-10-CM | POA: Insufficient documentation

## 2018-09-22 DIAGNOSIS — Z79899 Other long term (current) drug therapy: Secondary | ICD-10-CM | POA: Insufficient documentation

## 2018-09-22 DIAGNOSIS — Z1211 Encounter for screening for malignant neoplasm of colon: Secondary | ICD-10-CM

## 2018-09-22 DIAGNOSIS — R590 Localized enlarged lymph nodes: Secondary | ICD-10-CM | POA: Insufficient documentation

## 2018-09-22 DIAGNOSIS — C8331 Diffuse large B-cell lymphoma, lymph nodes of head, face, and neck: Secondary | ICD-10-CM

## 2018-09-22 LAB — COMPREHENSIVE METABOLIC PANEL
ALBUMIN: 4.2 g/dL (ref 3.5–5.0)
ALK PHOS: 81 U/L (ref 38–126)
ALT: 25 U/L (ref 0–44)
AST: 23 U/L (ref 15–41)
Anion gap: 7 (ref 5–15)
BILIRUBIN TOTAL: 1.2 mg/dL (ref 0.3–1.2)
BUN: 10 mg/dL (ref 8–23)
CALCIUM: 10.1 mg/dL (ref 8.9–10.3)
CO2: 29 mmol/L (ref 22–32)
CREATININE: 0.68 mg/dL (ref 0.44–1.00)
Chloride: 103 mmol/L (ref 98–111)
GFR calc Af Amer: >60 mL/min (ref >60)
GFR calc non Af Amer: >60 mL/min (ref >60)
GLUCOSE: 106 mg/dL — AB (ref 70–99)
Potassium: 4.6 mmol/L (ref 3.5–5.1)
SODIUM: 139 mmol/L (ref 135–145)
TOTAL PROTEIN: 6.8 g/dL (ref 6.5–8.1)

## 2018-09-22 LAB — CBC WITH DIFFERENTIAL/PLATELET
Basophils Absolute: 0 10*3/uL (ref 0–0.1)
Basophils Relative: 1 %
EOS ABS: 0.1 10*3/uL (ref 0–0.7)
EOS PCT: 2 %
HCT: 40 % (ref 35.0–47.0)
HEMOGLOBIN: 13.9 g/dL (ref 12.0–16.0)
LYMPHS ABS: 0.7 10*3/uL — AB (ref 1.0–3.6)
Lymphocytes Relative: 16 %
MCH: 30.1 pg (ref 26.0–34.0)
MCHC: 34.6 g/dL (ref 32.0–36.0)
MCV: 87 fL (ref 80.0–100.0)
MONOS PCT: 10 %
Monocytes Absolute: 0.4 10*3/uL (ref 0.2–0.9)
Neutro Abs: 3.1 10*3/uL (ref 1.4–6.5)
Neutrophils Relative %: 71 %
Platelets: 192 10*3/uL (ref 150–440)
RBC: 4.6 MIL/uL (ref 3.80–5.20)
RDW: 13.1 % (ref 11.5–14.5)
WBC: 4.2 10*3/uL (ref 3.6–11.0)

## 2018-09-22 LAB — LACTATE DEHYDROGENASE: LDH: 131 U/L (ref 98–192)

## 2018-09-22 NOTE — Progress Notes (Signed)
No new changes noted today 

## 2018-09-24 NOTE — Progress Notes (Signed)
Hematology/Oncology Consult note [facility]  Telephone:([phone] [phone]  Patient Care Team: Virginia Crews, MD as PCP - General (Family Medicine) Margarita Rana, MD as Referring Physician (Family Medicine) Bary Castilla Forest Gleason, MD (General Surgery) Sindy Guadeloupe, MD as Consulting Physician (Oncology) Noreene Filbert, MD as Referring Physician (Radiation Oncology)   Name of the patient: Victoria Benson  [id number]  [birth year]   Date of visit: 09/24/18  Diagnosis- Stage II E DLBCL. ABCsubtype. Not double hit.S/p 3 cycles of RCHOP  Chief complaint/ Reason for visit-routine follow-up of dlbcl  Heme/Onc history: patient is a 62 year old female who developed symptoms of upper respiratory infection and nasal congestion sometime in November 2018. Around the same time she also developed a right neck mass which was initially attributed to possible tonsillitis and she was given antibiotics for the same. However her right neck mass did not decrease in size and she eventually saw Dr. Pryor Ochoa from ENT. Patient was found to have significant right cervical adenopathy and underwent CT of the soft tissue neck which showed right level 2 and 3 adenopathy favoring metastatic carcinoma. Jugulodigastric node measuring up to 3.7 cm. Asymmetric fullness of the right tonsillar fossa possibly primary site.  Patient underwent core biopsy of the right cervical lymph node.Pathology showed diffuse large B-cell lymphoma of the ABC subtype. IHC was positive for BCL-2 and BCL 6 see Mick IHC was not tested due to insufficiency of the testing sample. Ki-67 was high 80-90%.  Patient reports feeling well prior to November 2018. Currently she denies any fevers, chills, unintentional weight loss or drenching night sweats. She reports some difficulty swallowing because of the right neck mass but denies any shortness of breath. She did receive 1 dose of 100 mg prednisone  yesterday and reports that her mass feels smaller and swallowing is easier.  PET/CT scan showed hypermetabolism in the tonsillar fossa bilaterally right greater than left as well as hypermetabolic lymphadenopathy in the right neck at the level 2 and level 3. No evidence of contralateral cervical adenopathy and no evidence of metastatic disease elsewhere  Bone marrow biopsy showed no evidence of lymphoma. IPI score 0- low risk. Does not meet criteria for CNS prophylaxis  Patient finished 3 cycles of RCHOP on 02/27/18.PET showed no active disease. She then completed IFRT  Interval history-she is doing well and reports no complaints today.  Her appetite is good and her energy levels are improved denies any lumps or bumps anywhere, fatigue or unintentional weight loss or night sweats  ECOG PS- 0 Pain scale- 0 Opioid associated constipation- no  Review of systems- Review of Systems  Constitutional: Negative for chills, fever, malaise/fatigue and weight loss.  HENT: Negative for congestion, ear discharge and nosebleeds.   Eyes: Negative for blurred vision.  Respiratory: Negative for cough, hemoptysis, sputum production, shortness of breath and wheezing.   Cardiovascular: Negative for chest pain, palpitations, orthopnea and claudication.  Gastrointestinal: Negative for abdominal pain, blood in stool, constipation, diarrhea, heartburn, melena, nausea and vomiting.  Genitourinary: Negative for dysuria, flank pain, frequency, hematuria and urgency.  Musculoskeletal: Negative for back pain, joint pain and myalgias.  Skin: Negative for rash.  Neurological: Negative for dizziness, tingling, focal weakness, seizures, weakness and headaches.  Endo/Heme/Allergies: Does not bruise/bleed easily.  Psychiatric/Behavioral: Negative for depression and suicidal ideas. The patient does not have insomnia.       No Known Allergies   Past Medical History:  Diagnosis Date  . BRCA gene mutation negative  07/10/2016  . Breast cancer ([place]) 2009   T1c (1.2 cm) N0 ER 90%, PR 90%, HER-2/neu not amplified. Oncotype DX score, low risk (14) 9% chance of recurrence with antiestrogen therapy alone.  . Breast cancer ([place]) 06-17-16   INVASIVE MAMMARY CARCINOMA . T1, ER positive, PR positive, HER-2/neu not overexpressed  . Diffuse large B-cell lymphoma ([place]) 11/2017   Right cervical adenopathy  . Family history of adverse reaction to anesthesia    PTS MOM-HARD TO WAKE UP  . Heart murmur   . Radiation 2009   BREAST CA  . Skin cancer      Past Surgical History:  Procedure Laterality Date  . BREAST BIOPSY Right 06/17/2016   invasive. T1, ER positive, PR positive, HER-2/neu not overexpressed  . BREAST CYST ASPIRATION Left 2002  . BREAST EXCISIONAL BIOPSY Right 06/2016   lumpectomy with rad   . BREAST EXCISIONAL BIOPSY Right 2009   breast ca + mammo site radiation  . BREAST LUMPECTOMY WITH NEEDLE LOCALIZATION Right 07/18/2016   Procedure: BREAST LUMPECTOMY WITH NEEDLE LOCALIZATION AND RIGHT AXILLARY EXPLORATION;  Surgeon: Robert Bellow, MD;  Location: [facility] ORS;  Service: General;  Laterality: Right;  . BREAST SURGERY Right July 2009   Wide excision, sentinel left node biopsy MammoSite. ( 06/21/2008  . CHOLECYSTECTOMY  1984  . DILATION AND CURETTAGE OF UTERUS  2004  . ENDOMETRIAL ABLATION  2005  . IR FLUORO GUIDE PORT INSERTION RIGHT  01/15/2018  . TUMOR REMOVAL  12/2013   from uterus done by [facility]    Social History   Socioeconomic History  . Marital status: Married    Spouse name: [name]  . Number of children: 2  . Years of education: 43  . Highest education level: Not on file  Occupational History  . Occupation: unemployed  Social Needs  . Financial resource strain: Not on file  . Food insecurity:    Worry: Not on file    Inability: Not on file  . Transportation needs:    Medical: Not on file    Non-medical: Not on file  Tobacco Use  . Smoking status: Never Smoker  .  Smokeless tobacco: Never Used  Substance and Sexual Activity  . Alcohol use: No  . Drug use: No  . Sexual activity: Yes  Lifestyle  . Physical activity:    Days per week: Not on file    Minutes per session: Not on file  . Stress: Not on file  Relationships  . Social connections:    Talks on phone: Not on file    Gets together: Not on file    Attends religious service: Not on file    Active member of club or organization: Not on file    Attends meetings of clubs or organizations: Not on file    Relationship status: Not on file  . Intimate partner violence:    Fear of current or ex partner: Not on file    Emotionally abused: Not on file    Physically abused: Not on file    Forced sexual activity: Not on file  Other Topics Concern  . Not on file  Social History Narrative  . Not on file    Family History  Problem Relation Age of Onset  . CAD Mother   . Osteoporosis Mother   . Lung cancer Father   . Diabetes Father   . Hypertension Brother   . Ovarian cancer Maternal Grandmother   . Heart attack Paternal Grandmother   .  Diabetes Paternal Grandfather   . Fibromyalgia Brother   . Hypertension Brother   . Hypertension Brother   . Sleep apnea Brother   . Healthy Brother   . Sleep apnea Brother   . Ovarian cancer Other   . Breast cancer Neg Hx      Current Outpatient Medications:  .  anastrozole (ARIMIDEX) 1 MG tablet, TAKE ONE TABLET BY MOUTH EVERY DAY, Disp: 90 tablet, Rfl: 3 .  CALCIUM-MAGNESIUM-VITAMIN D ER PO, Take 1 tablet by mouth daily., Disp: , Rfl:  .  cetirizine (ZYRTEC) 10 MG tablet, Take 10 mg daily by mouth., Disp: , Rfl:  .  fluticasone (FLONASE) 50 MCG/ACT nasal spray, Place 2 sprays daily into both nostrils., Disp: 16 g, Rfl: 6 .  Multiple Vitamin (MULTIVITAMIN) capsule, Take 1 capsule by mouth daily., Disp: , Rfl:  .  Omega-3 Krill Oil 500 MG CAPS, Take 1,000 mg by mouth daily. , Disp: , Rfl:   Physical exam:  Vitals:   09/22/18 1122  BP: 103/69    Pulse: 74  Resp: 18  Temp: 97.9 F (36.6 C)  TempSrc: Tympanic  SpO2: 97%  Weight: 153 lb 4.8 oz (69.5 kg)  Height: 5' 3"  (1.6 m)   Physical Exam  Constitutional: She is oriented to person, place, and time. She appears well-developed and well-nourished.  HENT:  Head: Normocephalic and atraumatic.  Eyes: Pupils are equal, round, and reactive to light. EOM are normal.  Neck: Normal range of motion.  Cardiovascular: Normal rate, regular rhythm and normal heart sounds.  Pulmonary/Chest: Effort normal and breath sounds normal.  Abdominal: Soft. Bowel sounds are normal.  No palpable splenomegaly  Lymphadenopathy:  No palpable cervical, supraclavicular, axillary or inguinal adenopathy   Neurological: She is alert and oriented to person, place, and time.  Skin: Skin is warm and dry.     CMP Latest Ref Rng & Units 09/22/2018  Glucose 70 - 99 mg/dL 106(H)  BUN 8 - 23 mg/dL 10  Creatinine 0.44 - 1.00 mg/dL 0.68  Sodium 135 - 145 mmol/L 139  Potassium 3.5 - 5.1 mmol/L 4.6  Chloride 98 - 111 mmol/L 103  CO2 22 - 32 mmol/L 29  Calcium 8.9 - 10.3 mg/dL 10.1  Total Protein 6.5 - 8.1 g/dL 6.8  Total Bilirubin 0.3 - 1.2 mg/dL 1.2  Alkaline Phos 38 - 126 U/L 81  AST 15 - 41 U/L 23  ALT 0 - 44 U/L 25   CBC Latest Ref Rng & Units 09/22/2018  WBC 3.6 - 11.0 K/uL 4.2  Hemoglobin 12.0 - 16.0 g/dL 13.9  Hematocrit 35.0 - 47.0 % 40.0  Platelets 150 - 440 K/uL 192      Assessment and plan- Patient is a 62 y.o. female with Stage II E DLBCL abc subtypenot double hit. Low risk IPI score 0s/p 3 cycles of RCHOP chemotherapy and IFRT in CR1.  She is here for routine follow-up of dlbcl  Clinically she is doing well and there is no evidence of recurrence on today's exam.  She did have some delayed neutropenia secondary to Rituxan which is now resolved.  I will see her back in 3 months time with CBC CMP and LDH.   Visit Diagnosis 1. Encounter for follow-up surveillance of diffuse large B-cell  lymphoma      Dr. Randa Evens, MD, MPH [facility] [phone] 09/24/2018 8:58 AM

## 2018-09-25 ENCOUNTER — Encounter: Payer: Self-pay | Admitting: Family Medicine

## 2018-09-25 ENCOUNTER — Ambulatory Visit (INDEPENDENT_AMBULATORY_CARE_PROVIDER_SITE_OTHER): Payer: 59 | Admitting: Family Medicine

## 2018-09-25 VITALS — BP 100/62 | HR 80 | Temp 98.1°F | Ht 63.0 in | Wt 153.0 lb

## 2018-09-25 DIAGNOSIS — Z0000 Encounter for general adult medical examination without abnormal findings: Secondary | ICD-10-CM | POA: Diagnosis not present

## 2018-09-25 DIAGNOSIS — Z23 Encounter for immunization: Secondary | ICD-10-CM | POA: Diagnosis not present

## 2018-09-25 DIAGNOSIS — C50211 Malignant neoplasm of upper-inner quadrant of right female breast: Secondary | ICD-10-CM

## 2018-09-25 DIAGNOSIS — C8330 Diffuse large B-cell lymphoma, unspecified site: Secondary | ICD-10-CM

## 2018-09-25 DIAGNOSIS — E039 Hypothyroidism, unspecified: Secondary | ICD-10-CM

## 2018-09-25 DIAGNOSIS — R42 Dizziness and giddiness: Secondary | ICD-10-CM

## 2018-09-25 DIAGNOSIS — E038 Other specified hypothyroidism: Secondary | ICD-10-CM

## 2018-09-25 DIAGNOSIS — Z170 Estrogen receptor positive status [ER+]: Secondary | ICD-10-CM

## 2018-09-25 DIAGNOSIS — E785 Hyperlipidemia, unspecified: Secondary | ICD-10-CM | POA: Diagnosis not present

## 2018-09-25 NOTE — Assessment & Plan Note (Signed)
Managed by Surgery Following annual mammograms - UTD On arimidex for total of 5 years

## 2018-09-25 NOTE — Progress Notes (Signed)
Patient: Victoria Benson, Female    DOB: 1956/08/21, 62 y.o.   MRN: [id number] Visit Date: 09/25/2018  Today's Provider: Lavon Paganini, MD   Chief Complaint  Patient presents with  . Annual Exam   Subjective:  I, Tiburcio Pea, CMA, am acting as a scribe for Lavon Paganini, MD.    Annual physical exam Victoria Benson is a 61 y.o. female who presents today for health maintenance and complete physical. She feels well. She reports exercising 5 days per week walking. She reports she is sleeping well.  ----------------------------------------------------------------- Pap: 03/21/2017 Colonoscopy:10/06/2007- Patient has appointment for 10/13/2018 Mammogram: 06/11/2018  Dizziness intermittently for a few months.  Feels lightheaded when bending over or getting up from standing.  This is not present when she wakes up in the morning, but it is worse in the mid morning.  It is then better in the afternoons.  She thinks she may eat more sodium at lunch than she does at other meals.  She had an episode that worried her a few days ago where she felt very close to passing out.  She has not had any falls or syncope.  She denies any palpitations, chest pain, shortness of breath, visual changes, vertigo.   Review of Systems  Constitutional: Negative.   HENT: Positive for tinnitus.   Eyes: Positive for itching.  Respiratory: Negative.   Cardiovascular: Negative.   Gastrointestinal: Negative.   Endocrine: Negative.   Genitourinary: Negative.   Musculoskeletal: Negative.   Skin: Negative.   Allergic/Immunologic: Negative.   Neurological: Negative.   Hematological: Negative.   Psychiatric/Behavioral: Negative.     Social History      She  reports that she has never smoked. She has never used smokeless tobacco. She reports that she does not drink alcohol or use drugs.       Social History   Socioeconomic History  . Marital status: Married    Spouse name: [name]  . Number of children: 2   . Years of education: 51  . Highest education level: Not on file  Occupational History  . Occupation: unemployed  Social Needs  . Financial resource strain: Not on file  . Food insecurity:    Worry: Not on file    Inability: Not on file  . Transportation needs:    Medical: Not on file    Non-medical: Not on file  Tobacco Use  . Smoking status: Never Smoker  . Smokeless tobacco: Never Used  Substance and Sexual Activity  . Alcohol use: No  . Drug use: No  . Sexual activity: Yes  Lifestyle  . Physical activity:    Days per week: Not on file    Minutes per session: Not on file  . Stress: Not on file  Relationships  . Social connections:    Talks on phone: Not on file    Gets together: Not on file    Attends religious service: Not on file    Active member of club or organization: Not on file    Attends meetings of clubs or organizations: Not on file    Relationship status: Not on file  Other Topics Concern  . Not on file  Social History Narrative  . Not on file    Past Medical History:  Diagnosis Date  . BRCA gene mutation negative 07/10/2016  . Breast cancer ([place]) 2009   T1c (1.2 cm) N0 ER 90%, PR 90%, HER-2/neu not amplified. Oncotype DX score, low risk (14)  9% chance of recurrence with antiestrogen therapy alone.  . Breast cancer ([place]) 06-17-16   INVASIVE MAMMARY CARCINOMA . T1, ER positive, PR positive, HER-2/neu not overexpressed  . Diffuse large B-cell lymphoma ([place]) 11/2017   Right cervical adenopathy  . Family history of adverse reaction to anesthesia    PTS MOM-HARD TO WAKE UP  . Heart murmur   . Radiation 2009   BREAST CA  . Skin cancer      Patient Active Problem List   Diagnosis Date Noted  . Dizziness 09/25/2018  . DLBCL (diffuse large B cell lymphoma) ([place]) 01/14/2018  . Chronic venous insufficiency 04/21/2017  . Leg pain 04/21/2017  . Malignant neoplasm of upper-inner quadrant of right breast in female, estrogen receptor positive ([place])  07/11/2016  . Knee pain, left 05/14/2016  . Subclinical hypothyroidism 03/20/2016  . Allergic rhinitis 01/04/2016  . Varicose veins of both lower extremities with pain 01/04/2016  . Hyperglyceridemia, pure 06/22/2009  . Avitaminosis D 05/18/2009  . Fam hx-ischem heart disease 04/05/2009  . HLD (hyperlipidemia) 04/04/2009  . Adaptive colitis 04/04/2009    Past Surgical History:  Procedure Laterality Date  . BREAST BIOPSY Right 06/17/2016   invasive. T1, ER positive, PR positive, HER-2/neu not overexpressed  . BREAST CYST ASPIRATION Left 2002  . BREAST EXCISIONAL BIOPSY Right 06/2016   lumpectomy with rad   . BREAST EXCISIONAL BIOPSY Right 2009   breast ca + mammo site radiation  . BREAST LUMPECTOMY WITH NEEDLE LOCALIZATION Right 07/18/2016   Procedure: BREAST LUMPECTOMY WITH NEEDLE LOCALIZATION AND RIGHT AXILLARY EXPLORATION;  Surgeon: Robert Bellow, MD;  Location: [facility] ORS;  Service: General;  Laterality: Right;  . BREAST SURGERY Right July 2009   Wide excision, sentinel left node biopsy MammoSite. ( 06/21/2008  . CHOLECYSTECTOMY  1984  . DILATION AND CURETTAGE OF UTERUS  2004  . ENDOMETRIAL ABLATION  2005  . IR FLUORO GUIDE PORT INSERTION RIGHT  01/15/2018  . TUMOR REMOVAL  12/2013   from uterus done by [facility]    Family History        Family Status  Relation Name Status  . Mother  Alive  . Father  Deceased       CA  . Brother 1 Alive  . MGM  Deceased at age 87       CA  . PGM  Deceased  . PGF  Deceased  . Brother 2 Alive  . Brother 3 Alive  . Brother 4 Alive  . MGF  Deceased at age 93       old age  . Other Maternal Janett Labella (Not Specified)  . Neg Hx  (Not Specified)        Her family history includes CAD in her mother; Diabetes in her father and paternal grandfather; Fibromyalgia in her brother; Healthy in her brother; Heart attack in her paternal grandmother; Hypertension in her brother, brother, and brother; Lung cancer in her father; Osteoporosis  in her mother; Ovarian cancer in her maternal grandmother and other; Sleep apnea in her brother and brother. There is no history of Breast cancer.      No Known Allergies   Current Outpatient Medications:  .  anastrozole (ARIMIDEX) 1 MG tablet, TAKE ONE TABLET BY MOUTH EVERY DAY, Disp: 90 tablet, Rfl: 3 .  CALCIUM-MAGNESIUM-VITAMIN D ER PO, Take 1 tablet by mouth daily., Disp: , Rfl:  .  cetirizine (ZYRTEC) 10 MG tablet, Take 10 mg daily by mouth., Disp: , Rfl:  .  fluticasone (FLONASE) 50 MCG/ACT nasal spray, Place 2 sprays daily into both nostrils., Disp: 16 g, Rfl: 6 .  Multiple Vitamin (MULTIVITAMIN) capsule, Take 1 capsule by mouth daily., Disp: , Rfl:  .  Omega-3 Krill Oil 500 MG CAPS, Take 1,000 mg by mouth daily. , Disp: , Rfl:    Patient Care Team: Virginia Crews, MD as PCP - General (Family Medicine) Margarita Rana, MD as Referring Physician (Family Medicine) Bary Castilla, Forest Gleason, MD (General Surgery) Sindy Guadeloupe, MD as Consulting Physician (Oncology) Noreene Filbert, MD as Referring Physician (Radiation Oncology)      Objective:   Vitals: BP 100/62 (BP Location: Right Arm, Patient Position: Sitting, Cuff Size: Normal)   Pulse 80   Temp 98.1 F (36.7 C) (Oral)   Ht 5' 3" (1.6 m)   Wt 153 lb (69.4 kg)   SpO2 98%   BMI 27.10 kg/m    Vitals:   09/25/18 1107  BP: 100/62  Pulse: 80  Temp: 98.1 F (36.7 C)  TempSrc: Oral  SpO2: 98%  Weight: 153 lb (69.4 kg)  Height: 5' 3" (1.6 m)     Orthostatic VS for the past 24 hrs:  BP- Lying Pulse- Lying BP- Sitting Pulse- Sitting BP- Standing at 0 minutes Pulse- Standing at 0 minutes  09/25/18 1157 100/64 79 107/71 76 121/83 73     Physical Exam  Constitutional: She is oriented to person, place, and time. She appears well-developed and well-nourished. No distress.  HENT:  Head: Normocephalic and atraumatic.  Right Ear: External ear normal.  Left Ear: External ear normal.  Nose: Nose normal.  Mouth/Throat:  Uvula is midline and oropharynx is clear and moist. Mucous membranes are dry. No posterior oropharyngeal edema or posterior oropharyngeal erythema.  Eyes: Pupils are equal, round, and reactive to light. Conjunctivae and EOM are normal. No scleral icterus.  Neck: Neck supple. No thyromegaly present.  Cardiovascular: Normal rate, regular rhythm, normal heart sounds and intact distal pulses.  No murmur heard. Pulmonary/Chest: Effort normal and breath sounds normal. No respiratory distress. She has no wheezes. She has no rales.  Abdominal: Soft. Bowel sounds are normal. She exhibits no distension. There is no tenderness. There is no rebound and no guarding.  Musculoskeletal: She exhibits no edema or deformity.  Lymphadenopathy:    She has no cervical adenopathy.  Neurological: She is alert and oriented to person, place, and time.  Skin: Skin is warm and dry. Capillary refill takes less than 2 seconds. No rash noted.  Psychiatric: She has a normal mood and affect. Her behavior is normal.  Vitals reviewed.    Depression Screen PHQ 2/9 Scores 09/25/2018 07/30/2018 03/31/2018 03/21/2017  PHQ - 2 Score 0 0 0 0  PHQ- 9 Score 0 - - 0      Assessment & Plan:     Routine Health Maintenance and Physical Exam  Exercise Activities and Dietary recommendations Goals   None     Immunization History  Administered Date(s) Administered  . Influenza Split 09/10/2011, 12/11/2012  . Influenza,inj,Quad PF,6+ Mos 09/25/2018  . Influenza,inj,quad, With Preservative 12/19/2017  . Influenza-Unspecified 12/04/2015  . Td 07/10/1999  . Tdap 04/12/2009    Health Maintenance  Topic Date Due  . COLONOSCOPY  10/05/2017  . TETANUS/TDAP  04/13/2019  . PAP SMEAR  03/21/2020  . MAMMOGRAM  06/11/2020  . INFLUENZA VACCINE  Completed  . Hepatitis C Screening  Completed  . HIV Screening  Completed     Discussed health benefits of  physical activity, and encouraged her to engage in regular exercise appropriate  for her age and condition.    --------------------------------------------------------------------  Problem List Items Addressed This Visit      Endocrine   Subclinical hypothyroidism   Relevant Orders   TSH     Other   HLD (hyperlipidemia)   Relevant Orders   Lipid panel   Malignant neoplasm of upper-inner quadrant of right breast in female, estrogen receptor positive ([place])    Managed by Surgery Following annual mammograms - UTD On arimidex for total of 5 years      DLBCL (diffuse large B cell lymphoma) ([place])    Followed by Oncology q3 months Doing well Does have some residual stomatitis from radiation      Dizziness    Positional dizziness, but no vertigo Suspect this is related to her relatively low blood pressure She is not on any antihypertensives Suspect that her weight loss and decreased p.o. intake related to recent chemo and radiation have contributed to this Discussed adequate hydration and getting up slowly No significant orthostatic drop in blood pressure on exam today Increased sodium intake advised as well Return precautions discussed       Other Visit Diagnoses    Encounter for annual physical exam    -  Primary   Need for influenza vaccination       Relevant Orders   Flu Vaccine QUAD 6+ mos PF IM (Fluarix Quad PF) (Completed)       Return in about 1 year (around 09/26/2019) for CPE.   The entirety of the information documented in the History of Present Illness, Review of Systems and Physical Exam were personally obtained by me. Portions of this information were initially documented by Tiburcio Pea, CMA and reviewed by me for thoroughness and accuracy.    Virginia Crews, MD, MPH [facility] 09/25/2018 2:55 PM

## 2018-09-25 NOTE — Patient Instructions (Addendum)
The CDC recommends two doses of Shingrix (the shingles vaccine) separated by 2 to 6 months for adults age 61 years and older. I recommend checking with your insurance plan regarding coverage for this vaccine.   Ask Dr Janese Banks if it's ok to get Shingles and Pneumonia vaccines    Preventive Care 40-64 Years, Female Preventive care refers to lifestyle choices and visits with your health care provider that can promote health and wellness. What does preventive care include?  A yearly physical exam. This is also called an annual well check.  Dental exams once or twice a year.  Routine eye exams. Ask your health care provider how often you should have your eyes checked.  Personal lifestyle choices, including: ? Daily care of your teeth and gums. ? Regular physical activity. ? Eating a healthy diet. ? Avoiding tobacco and drug use. ? Limiting alcohol use. ? Practicing safe sex. ? Taking low-dose aspirin daily starting at age 13. ? Taking vitamin and mineral supplements as recommended by your health care provider. What happens during an annual well check? The services and screenings done by your health care provider during your annual well check will depend on your age, overall health, lifestyle risk factors, and family history of disease. Counseling Your health care provider may ask you questions about your:  Alcohol use.  Tobacco use.  Drug use.  Emotional well-being.  Home and relationship well-being.  Sexual activity.  Eating habits.  Work and work Statistician.  Method of birth control.  Menstrual cycle.  Pregnancy history.  Screening You may have the following tests or measurements:  Height, weight, and BMI.  Blood pressure.  Lipid and cholesterol levels. These may be checked every 5 years, or more frequently if you are over 4 years old.  Skin check.  Lung cancer screening. You may have this screening every year starting at age 32 if you have a 30-pack-year  history of smoking and currently smoke or have quit within the past 15 years.  Fecal occult blood test (FOBT) of the stool. You may have this test every year starting at age 72.  Flexible sigmoidoscopy or colonoscopy. You may have a sigmoidoscopy every 5 years or a colonoscopy every 10 years starting at age 41.  Hepatitis C blood test.  Hepatitis B blood test.  Sexually transmitted disease (STD) testing.  Diabetes screening. This is done by checking your blood sugar (glucose) after you have not eaten for a while (fasting). You may have this done every 1-3 years.  Mammogram. This may be done every 1-2 years. Talk to your health care provider about when you should start having regular mammograms. This may depend on whether you have a family history of breast cancer.  BRCA-related cancer screening. This may be done if you have a family history of breast, ovarian, tubal, or peritoneal cancers.  Pelvic exam and Pap test. This may be done every 3 years starting at age 39. Starting at age 35, this may be done every 5 years if you have a Pap test in combination with an HPV test.  Bone density scan. This is done to screen for osteoporosis. You may have this scan if you are at high risk for osteoporosis.  Discuss your test results, treatment options, and if necessary, the need for more tests with your health care provider. Vaccines Your health care provider may recommend certain vaccines, such as:  Influenza vaccine. This is recommended every year.  Tetanus, diphtheria, and acellular pertussis (Tdap, Td) vaccine.  You may need a Td booster every 10 years.  Varicella vaccine. You may need this if you have not been vaccinated.  Zoster vaccine. You may need this after age 40.  Measles, mumps, and rubella (MMR) vaccine. You may need at least one dose of MMR if you were born in 1957 or later. You may also need a second dose.  Pneumococcal 13-valent conjugate (PCV13) vaccine. You may need this if  you have certain conditions and were not previously vaccinated.  Pneumococcal polysaccharide (PPSV23) vaccine. You may need one or two doses if you smoke cigarettes or if you have certain conditions.  Meningococcal vaccine. You may need this if you have certain conditions.  Hepatitis A vaccine. You may need this if you have certain conditions or if you travel or work in places where you may be exposed to hepatitis A.  Hepatitis B vaccine. You may need this if you have certain conditions or if you travel or work in places where you may be exposed to hepatitis B.  Haemophilus influenzae type b (Hib) vaccine. You may need this if you have certain conditions.  Talk to your health care provider about which screenings and vaccines you need and how often you need them. This information is not intended to replace advice given to you by your health care provider. Make sure you discuss any questions you have with your health care provider. Document Released: 01/12/2016 Document Revised: 09/04/2016 Document Reviewed: 10/17/2015 Elsevier Interactive Patient Education  [employer].

## 2018-09-25 NOTE — Assessment & Plan Note (Signed)
Positional dizziness, but no vertigo Suspect this is related to her relatively low blood pressure She is not on any antihypertensives Suspect that her weight loss and decreased p.o. intake related to recent chemo and radiation have contributed to this Discussed adequate hydration and getting up slowly No significant orthostatic drop in blood pressure on exam today Increased sodium intake advised as well Return precautions discussed

## 2018-09-25 NOTE — Assessment & Plan Note (Signed)
Followed by Oncology q3 months Doing well Does have some residual stomatitis from radiation

## 2018-09-29 DIAGNOSIS — E785 Hyperlipidemia, unspecified: Secondary | ICD-10-CM | POA: Diagnosis not present

## 2018-09-29 DIAGNOSIS — E039 Hypothyroidism, unspecified: Secondary | ICD-10-CM | POA: Diagnosis not present

## 2018-09-30 ENCOUNTER — Telehealth: Payer: Self-pay

## 2018-09-30 LAB — LIPID PANEL
Chol/HDL Ratio: 3.6 ratio (ref 0.0–4.4)
Cholesterol, Total: 167 mg/dL (ref 100–199)
HDL: 47 mg/dL (ref >39)
LDL Calculated: 91 mg/dL (ref 0–99)
Triglycerides: 147 mg/dL (ref 0–149)
VLDL CHOLESTEROL CAL: 29 mg/dL (ref 5–40)

## 2018-09-30 LAB — TSH: TSH: 3.77 u[IU]/mL (ref 0.450–4.500)

## 2018-09-30 NOTE — Telephone Encounter (Signed)
Patient advised.KW 

## 2018-09-30 NOTE — Telephone Encounter (Signed)
-----   Message from Carmon Ginsberg, [place] sent at 09/30/2018 10:34 AM EDT ----- Labs look good; cholesterol improved.

## 2018-09-30 NOTE — Telephone Encounter (Signed)
[id number] 

## 2018-10-01 ENCOUNTER — Encounter: Payer: 59 | Admitting: Family Medicine

## 2018-10-07 ENCOUNTER — Ambulatory Visit: Payer: 59 | Admitting: Radiation Oncology

## 2018-10-13 ENCOUNTER — Other Ambulatory Visit: Payer: Self-pay

## 2018-10-13 ENCOUNTER — Ambulatory Visit: Payer: Commercial Managed Care - HMO | Admitting: Anesthesiology

## 2018-10-13 ENCOUNTER — Ambulatory Visit
Admission: RE | Admit: 2018-10-13 | Discharge: 2018-10-13 | Disposition: A | Discharge status: Home / Self Care | Payer: Commercial Managed Care - HMO | Source: Ambulatory Visit | Attending: Gastroenterology | Admitting: Gastroenterology

## 2018-10-13 ENCOUNTER — Encounter
Admission: RE | Disposition: A | Discharge status: Home / Self Care | Payer: Self-pay | Source: Ambulatory Visit | Attending: Gastroenterology

## 2018-10-13 ENCOUNTER — Encounter: Payer: Self-pay | Admitting: *Deleted

## 2018-10-13 DIAGNOSIS — Z79899 Other long term (current) drug therapy: Secondary | ICD-10-CM | POA: Insufficient documentation

## 2018-10-13 DIAGNOSIS — K5730 Diverticulosis of large intestine without perforation or abscess without bleeding: Secondary | ICD-10-CM | POA: Insufficient documentation

## 2018-10-13 DIAGNOSIS — Z1211 Encounter for screening for malignant neoplasm of colon: Secondary | ICD-10-CM

## 2018-10-13 DIAGNOSIS — E039 Hypothyroidism, unspecified: Secondary | ICD-10-CM | POA: Diagnosis not present

## 2018-10-13 DIAGNOSIS — Z853 Personal history of malignant neoplasm of breast: Secondary | ICD-10-CM | POA: Insufficient documentation

## 2018-10-13 DIAGNOSIS — Z85828 Personal history of other malignant neoplasm of skin: Secondary | ICD-10-CM | POA: Diagnosis not present

## 2018-10-13 DIAGNOSIS — J309 Allergic rhinitis, unspecified: Secondary | ICD-10-CM | POA: Diagnosis not present

## 2018-10-13 HISTORY — PX: COLONOSCOPY WITH PROPOFOL: SHX5780

## 2018-10-13 SURGERY — COLONOSCOPY WITH PROPOFOL
Anesthesia: General

## 2018-10-13 MED ORDER — PROPOFOL 500 MG/50ML IV EMUL
INTRAVENOUS | Status: DC | PRN
  Administered 2018-10-13: 150 ug/kg/min via INTRAVENOUS

## 2018-10-13 MED ORDER — SODIUM CHLORIDE 0.9 % IV SOLN
INTRAVENOUS | Status: DC
  Administered 2018-10-13: 09:00:00 via INTRAVENOUS

## 2018-10-13 MED ORDER — LIDOCAINE 2% (20 MG/ML) 5 ML SYRINGE
INTRAMUSCULAR | Status: DC | PRN
  Administered 2018-10-13: 50 mg via INTRAVENOUS

## 2018-10-13 MED ORDER — PROPOFOL 500 MG/50ML IV EMUL
INTRAVENOUS | Status: AC
  Filled 2018-10-13: qty 50

## 2018-10-13 MED ORDER — PROPOFOL 10 MG/ML IV BOLUS
INTRAVENOUS | Status: DC | PRN
  Administered 2018-10-13: 40 mg via INTRAVENOUS
  Administered 2018-10-13: 60 mg via INTRAVENOUS

## 2018-10-13 MED ORDER — PHENYLEPHRINE HCL 10 MG/ML IJ SOLN
INTRAMUSCULAR | Status: DC | PRN
  Administered 2018-10-13: 100 ug via INTRAVENOUS

## 2018-10-13 NOTE — Anesthesia Preprocedure Evaluation (Signed)
Anesthesia Evaluation  Patient identified by MRN, date of birth, ID band Patient awake    Reviewed: Allergy & Precautions, H&P , NPO status , Patient's Chart, lab work & pertinent test results  History of Anesthesia Complications (+) Family history of anesthesia reaction and history of anesthetic complications  Airway Mallampati: III  TM Distance: <3 FB Neck ROM: limited    Dental  (+) Chipped   Pulmonary neg pulmonary ROS, neg shortness of breath,           Cardiovascular Exercise Tolerance: Good (-) angina(-) Past MI and (-) DOE      Neuro/Psych negative neurological ROS  negative psych ROS   GI/Hepatic negative GI ROS, Neg liver ROS,   Endo/Other  Hypothyroidism   Renal/GU negative Renal ROS  negative genitourinary   Musculoskeletal   Abdominal   Peds  Hematology negative hematology ROS (+)   Anesthesia Other Findings Past Medical History: 07/10/2016: BRCA gene mutation negative 2009: Breast cancer ([place])     Comment:  T1c (1.2 cm) N0 ER 90%, PR 90%, HER-2/neu not amplified.              Oncotype DX score, low risk (14) 9% chance of recurrence               with antiestrogen therapy alone. 06-17-16: Breast cancer ([place])     Comment:  INVASIVE MAMMARY CARCINOMA . T1, ER positive, PR               positive, HER-2/neu not overexpressed 11/2017: Diffuse large B-cell lymphoma ([place])     Comment:  Right cervical adenopathy No date: Family history of adverse reaction to anesthesia     Comment:  PTS MOM-HARD TO WAKE UP No date: Heart murmur 2009: Radiation     Comment:  BREAST CA No date: Skin cancer  Past Surgical History: No date: APPENDECTOMY 06/17/2016: BREAST BIOPSY; Right     Comment:  invasive. T1, ER positive, PR positive, HER-2/neu not               overexpressed 2002: BREAST CYST ASPIRATION; Left 06/2016: BREAST EXCISIONAL BIOPSY; Right     Comment:  lumpectomy with rad  2009: BREAST EXCISIONAL  BIOPSY; Right     Comment:  breast ca + mammo site radiation 07/18/2016: BREAST LUMPECTOMY WITH NEEDLE LOCALIZATION; Right     Comment:  Procedure: BREAST LUMPECTOMY WITH NEEDLE LOCALIZATION               AND RIGHT AXILLARY EXPLORATION;  Surgeon: Robert Bellow, MD;  Location: [facility] ORS;  Service: General;                Laterality: Right; July 2009: BREAST SURGERY; Right     Comment:  Wide excision, sentinel left node biopsy MammoSite. (               06/21/2008 1984: CHOLECYSTECTOMY 2004: [place] OF UTERUS 2005: ENDOMETRIAL ABLATION 01/15/2018: IR FLUORO GUIDE PORT INSERTION RIGHT 12/2013: TUMOR REMOVAL     Comment:  from uterus done by [facility]  BMI    Body Mass Index:  27.44 kg/m      Reproductive/Obstetrics negative OB ROS                             Anesthesia Physical Anesthesia Plan  ASA: III  Anesthesia Plan: General  Post-op Pain Management:    Induction: Intravenous  PONV Risk Score and Plan: Propofol infusion and TIVA  Airway Management Planned: Natural Airway and Nasal Cannula  Additional Equipment:   Intra-op Plan:   Post-operative Plan:   Informed Consent: I have reviewed the patients History and Physical, chart, labs and discussed the procedure including the risks, benefits and alternatives for the proposed anesthesia with the patient or authorized representative who has indicated his/her understanding and acceptance.   Dental Advisory Given  Plan Discussed with: Anesthesiologist, CRNA and Surgeon  Anesthesia Plan Comments: (Patient consented for risks of anesthesia including but not limited to:  - adverse reactions to medications - risk of intubation if required - damage to teeth, lips or other oral mucosa - sore throat or hoarseness - Damage to heart, brain, lungs or loss of life  Patient voiced understanding.)        Anesthesia Quick Evaluation

## 2018-10-13 NOTE — H&P (Signed)
Victoria Lame, MD [facility] [address] [place], [place] [address] Phone: [phone] Fax : [phone]  Primary Care Physician:  Virginia Crews, MD Primary Gastroenterologist:  Dr. Allen Norris  Pre-Procedure History & Physical: HPI:  Victoria Benson is a 62 y.o. female is here for a screening colonoscopy.   Past Medical History:  Diagnosis Date  . BRCA gene mutation negative 07/10/2016  . Breast cancer ([place]) 2009   T1c (1.2 cm) N0 ER 90%, PR 90%, HER-2/neu not amplified. Oncotype DX score, low risk (14) 9% chance of recurrence with antiestrogen therapy alone.  . Breast cancer ([place]) 06-17-16   INVASIVE MAMMARY CARCINOMA . T1, ER positive, PR positive, HER-2/neu not overexpressed  . Diffuse large B-cell lymphoma ([place]) 11/2017   Right cervical adenopathy  . Family history of adverse reaction to anesthesia    PTS MOM-HARD TO WAKE UP  . Heart murmur   . Radiation 2009   BREAST CA  . Skin cancer     Past Surgical History:  Procedure Laterality Date  . APPENDECTOMY    . BREAST BIOPSY Right 06/17/2016   invasive. T1, ER positive, PR positive, HER-2/neu not overexpressed  . BREAST CYST ASPIRATION Left 2002  . BREAST EXCISIONAL BIOPSY Right 06/2016   lumpectomy with rad   . BREAST EXCISIONAL BIOPSY Right 2009   breast ca + mammo site radiation  . BREAST LUMPECTOMY WITH NEEDLE LOCALIZATION Right 07/18/2016   Procedure: BREAST LUMPECTOMY WITH NEEDLE LOCALIZATION AND RIGHT AXILLARY EXPLORATION;  Surgeon: Robert Bellow, MD;  Location: [facility] ORS;  Service: General;  Laterality: Right;  . BREAST SURGERY Right July 2009   Wide excision, sentinel left node biopsy MammoSite. ( 06/21/2008  . CHOLECYSTECTOMY  1984  . DILATION AND CURETTAGE OF UTERUS  2004  . ENDOMETRIAL ABLATION  2005  . IR FLUORO GUIDE PORT INSERTION RIGHT  01/15/2018  . TUMOR REMOVAL  12/2013   from uterus done by [facility]    Prior to Admission medications   Medication Sig Start Date End Date Taking?  Authorizing Provider  anastrozole (ARIMIDEX) 1 MG tablet TAKE ONE TABLET BY MOUTH EVERY DAY 02/18/18   Byrnett, Forest Gleason, MD  CALCIUM-MAGNESIUM-VITAMIN D ER PO Take 1 tablet by mouth daily.    [provider]  cetirizine (ZYRTEC) 10 MG tablet Take 10 mg daily by mouth.    [provider]  fluticasone (FLONASE) 50 MCG/ACT nasal spray Place 2 sprays daily into both nostrils. 11/17/17   Virginia Crews, MD  Multiple Vitamin (MULTIVITAMIN) capsule Take 1 capsule by mouth daily.    [provider]  Omega-3 Krill Oil 500 MG CAPS Take 1,000 mg by mouth daily.     [provider]    Allergies as of 09/23/2018  . (No Known Allergies)    Family History  Problem Relation Age of Onset  . CAD Mother   . Osteoporosis Mother   . Lung cancer Father   . Diabetes Father   . Hypertension Brother   . Ovarian cancer Maternal Grandmother   . Heart attack Paternal Grandmother   . Diabetes Paternal Grandfather   . Fibromyalgia Brother   . Hypertension Brother   . Hypertension Brother   . Sleep apnea Brother   . Healthy Brother   . Sleep apnea Brother   . Ovarian cancer Other   . Breast cancer Neg Hx     Social History   Socioeconomic History  . Marital status: Married    Spouse name:  Mark  . Number of children: 2  . Years of education: 21  . Highest education level: Not on file  Occupational History  . Occupation: unemployed  Social Needs  . Financial resource strain: Not on file  . Food insecurity:    Worry: Not on file    Inability: Not on file  . Transportation needs:    Medical: Not on file    Non-medical: Not on file  Tobacco Use  . Smoking status: Never Smoker  . Smokeless tobacco: Never Used  Substance and Sexual Activity  . Alcohol use: No  . Drug use: No  . Sexual activity: Yes  Lifestyle  . Physical activity:    Days per week: Not on file    Minutes per session: Not on file  . Stress: Not on file  Relationships  . Social  connections:    Talks on phone: Not on file    Gets together: Not on file    Attends religious service: Not on file    Active member of club or organization: Not on file    Attends meetings of clubs or organizations: Not on file    Relationship status: Not on file  . Intimate partner violence:    Fear of current or ex partner: Not on file    Emotionally abused: Not on file    Physically abused: Not on file    Forced sexual activity: Not on file  Other Topics Concern  . Not on file  Social History Narrative  . Not on file    Review of Systems: See HPI, otherwise negative ROS  Physical Exam: BP 98/72   Pulse 77   Temp (!) 97.1 F (36.2 C) (Tympanic)   Resp 16   Ht 5' 2"  (1.575 m)   Wt 68 kg   SpO2 100%   BMI 27.44 kg/m  General:   Alert,  pleasant and cooperative in NAD Head:  Normocephalic and atraumatic. Neck:  Supple; no masses or thyromegaly. Lungs:  Clear throughout to auscultation.    Heart:  Regular rate and rhythm. Abdomen:  Soft, nontender and nondistended. Normal bowel sounds, without guarding, and without rebound.   Neurologic:  Alert and  oriented x4;  grossly normal neurologically.  Impression/Plan: Victoria Benson is now here to undergo a screening colonoscopy.  Risks, benefits, and alternatives regarding colonoscopy have been reviewed with the patient.  Questions have been answered.  All parties agreeable.

## 2018-10-13 NOTE — Anesthesia Post-op Follow-up Note (Signed)
Anesthesia QCDR form completed.        

## 2018-10-13 NOTE — Transfer of Care (Signed)
Immediate Anesthesia Transfer of Care Note  Patient: Victoria Benson  Procedure(s) Performed: COLONOSCOPY WITH PROPOFOL (N/A )  Patient Location: Endoscopy Unit  Anesthesia Type:General  Level of Consciousness: sedated  Airway & Oxygen Therapy: Patient connected to nasal cannula oxygen  Post-op Assessment: Post -op Vital signs reviewed and stable  Post vital signs: stable  Last Vitals:  Vitals Value Taken Time  BP    Temp    Pulse    Resp    SpO2      Last Pain:  Vitals:   10/13/18 0904  TempSrc: Tympanic  PainSc: 0-No pain         Complications: No apparent anesthesia complications

## 2018-10-13 NOTE — Anesthesia Postprocedure Evaluation (Signed)
Anesthesia Post Note  Patient: Victoria Benson  Procedure(s) Performed: COLONOSCOPY WITH PROPOFOL (N/A )  Patient location during evaluation: Endoscopy Anesthesia Type: General Level of consciousness: awake and alert Pain management: pain level controlled Vital Signs Assessment: post-procedure vital signs reviewed and stable Respiratory status: spontaneous breathing, nonlabored ventilation, respiratory function stable and patient connected to nasal cannula oxygen Cardiovascular status: blood pressure returned to baseline and stable Postop Assessment: no apparent nausea or vomiting Anesthetic complications: no     Last Vitals:  Vitals:   10/13/18 1006 10/13/18 1016  BP: (!) 80/68 (!) 90/58  Pulse: 62 63  Resp: 15 12  Temp:    SpO2: 100% 100%    Last Pain:  Vitals:   10/13/18 0956  TempSrc:   PainSc: 0-No pain                 Precious Haws Phoebie Shad

## 2018-10-13 NOTE — Op Note (Signed)
[facility] Gastroenterology Patient Name: Victoria Benson Procedure Date: 10/13/2018 9:23 AM MRN: [id number] Account #: [id number] Date of Birth: [birth year] Admit Type: Outpatient Age: 62 Room: [facility] ENDO ROOM 4 Gender: Female Note Status: Finalized Procedure:            Colonoscopy Indications:          Screening for colorectal malignant neoplasm Providers:            Lucilla Lame MD, MD Referring MD:         Dionne Bucy. Bacigalupo (Referring MD) Medicines:            Propofol per Anesthesia Complications:        No immediate complications. Procedure:            Pre-Anesthesia Assessment:                       - Prior to the procedure, a History and Physical was                        performed, and patient medications and allergies were                        reviewed. The patient's tolerance of previous                        anesthesia was also reviewed. The risks and benefits of                        the procedure and the sedation options and risks were                        discussed with the patient. All questions were                        answered, and informed consent was obtained. Prior                        Anticoagulants: The patient has taken no previous                        anticoagulant or antiplatelet agents. ASA Grade                        Assessment: II - A patient with mild systemic disease.                        After reviewing the risks and benefits, the patient was                        deemed in satisfactory condition to undergo the                        procedure.                       After obtaining informed consent, the colonoscope was                        passed under direct vision. Throughout the procedure,  the patient's blood pressure, pulse, and oxygen                        saturations were monitored continuously. The                        Colonoscope was introduced through the anus and          advanced to the the cecum, identified by appendiceal                        orifice and ileocecal valve. The colonoscopy was                        performed without difficulty. The patient tolerated the                        procedure well. The quality of the bowel preparation                        was excellent. Findings:      The perianal and digital rectal examinations were normal.      A few small-mouthed diverticula were found in the sigmoid colon.      The exam was otherwise without abnormality. Impression:           - Diverticulosis in the sigmoid colon.                       - The examination was otherwise normal.                       - No specimens collected. Recommendation:       - Discharge patient to home.                       - Resume previous diet.                       - Continue present medications.                       - Repeat colonoscopy in 10 years for screening unless                        any change in family history or lower GI problems. Procedure Code(s):    --- Professional ---                       [phone], Colonoscopy, flexible; diagnostic, including                        collection of specimen(s) by brushing or washing, when                        performed (separate procedure) Diagnosis Code(s):    --- Professional ---                       Z12.11, Encounter for screening for malignant neoplasm                        of colon CPT copyright 2018 American Medical Association. All rights reserved. The  codes documented in this report are preliminary and upon coder review may  be revised to meet current compliance requirements. Lucilla Lame MD, MD 10/13/2018 9:52:39 AM This report has been signed electronically. Number of Addenda: 0 Note Initiated On: 10/13/2018 9:23 AM      [facility]

## 2018-10-16 ENCOUNTER — Ambulatory Visit (INDEPENDENT_AMBULATORY_CARE_PROVIDER_SITE_OTHER): Payer: 59 | Admitting: Family Medicine

## 2018-10-16 ENCOUNTER — Encounter: Payer: Self-pay | Admitting: Family Medicine

## 2018-10-16 VITALS — BP 90/56 | HR 75 | Temp 98.2°F | Wt 152.6 lb

## 2018-10-16 DIAGNOSIS — N3001 Acute cystitis with hematuria: Secondary | ICD-10-CM | POA: Diagnosis not present

## 2018-10-16 LAB — POCT URINALYSIS DIPSTICK
BILIRUBIN UA: NEGATIVE
GLUCOSE UA: NEGATIVE
Ketones, UA: NEGATIVE
Nitrite, UA: NEGATIVE
PH UA: 6 (ref 5.0–8.0)
Protein, UA: POSITIVE — AB
Spec Grav, UA: 1.01 (ref 1.010–1.025)
UROBILINOGEN UA: 0.2 U/dL

## 2018-10-16 MED ORDER — CEPHALEXIN 500 MG PO CAPS: 500.0000 mg | ORAL_CAPSULE | Freq: Two times a day (BID) | ORAL | 0 refills | Status: DC

## 2018-10-16 NOTE — Progress Notes (Signed)
Patient: Victoria Benson Female    DOB: Aug 29, 1956   62 y.o.   MRN: [id number] Visit Date: 10/16/2018  Today's Provider: Lavon Paganini, MD   Chief Complaint  Patient presents with  . Dysuria   Subjective:    I, Tiburcio Pea, CMA, am acting as a scribe for Lavon Paganini, MD.   Dysuria   This is a new problem. Episode onset: Tuesday. The problem has been gradually worsening. The quality of the pain is described as burning. There has been no fever. Associated symptoms include chills, frequency and urgency. Pertinent negatives include no discharge, flank pain, hematuria, nausea or vomiting. She has tried nothing for the symptoms.       No Known Allergies   Current Outpatient Medications:  .  anastrozole (ARIMIDEX) 1 MG tablet, TAKE ONE TABLET BY MOUTH EVERY DAY, Disp: 90 tablet, Rfl: 3 .  CALCIUM-MAGNESIUM-VITAMIN D ER PO, Take 1 tablet by mouth daily., Disp: , Rfl:  .  cetirizine (ZYRTEC) 10 MG tablet, Take 10 mg daily by mouth., Disp: , Rfl:  .  fluticasone (FLONASE) 50 MCG/ACT nasal spray, Place 2 sprays daily into both nostrils., Disp: 16 g, Rfl: 6 .  Multiple Vitamin (MULTIVITAMIN) capsule, Take 1 capsule by mouth daily., Disp: , Rfl:  .  Omega-3 Krill Oil 500 MG CAPS, Take 1,000 mg by mouth daily. , Disp: , Rfl:   Review of Systems  Constitutional: Positive for chills.  Respiratory: Negative.   Cardiovascular: Negative.   Gastrointestinal: Negative for nausea and vomiting.  Genitourinary: Positive for dysuria, frequency and urgency. Negative for flank pain and hematuria.  Musculoskeletal: Negative.     Social History   Tobacco Use  . Smoking status: Never Smoker  . Smokeless tobacco: Never Used  Substance Use Topics  . Alcohol use: No   Objective:   BP (!) 90/56 (BP Location: Right Arm, Patient Position: Sitting, Cuff Size: Normal)   Pulse 75   Temp 98.2 F (36.8 C) (Oral)   Wt 152 lb 9.6 oz (69.2 kg)   SpO2 99%   BMI 27.91 kg/m  Vitals:   10/16/18 1557  BP: (!) 90/56  Pulse: 75  Temp: 98.2 F (36.8 C)  TempSrc: Oral  SpO2: 99%  Weight: 152 lb 9.6 oz (69.2 kg)     Physical Exam  Constitutional: She is oriented to person, place, and time. She appears well-developed and well-nourished. No distress.  HENT:  Head: Normocephalic and atraumatic.  Eyes: Conjunctivae are normal. No scleral icterus.  Cardiovascular: Normal rate and regular rhythm.  Pulmonary/Chest: Effort normal. No respiratory distress.  Abdominal: Soft. She exhibits no distension. There is no tenderness. There is no guarding and no CVA tenderness.  Musculoskeletal: She exhibits no edema.  Neurological: She is alert and oriented to person, place, and time.  Skin: Skin is warm and dry. Capillary refill takes less than 2 seconds. No rash noted.  Psychiatric: She has a normal mood and affect. Her behavior is normal.  Vitals reviewed.   Results for orders placed or performed in visit on 10/16/18  POCT Urinalysis Dipstick  Result Value Ref Range   Color, UA dark yellow    Clarity, UA hazy    Glucose, UA Negative Negative   Bilirubin, UA negative    Ketones, UA negative    Spec Grav, UA 1.010 1.010 - 1.025   Blood, UA large hemolyzed    pH, UA 6.0 5.0 - 8.0   Protein, UA Positive (A) Negative  Urobilinogen, UA 0.2 0.2 or 1.0 E.U./dL   Nitrite, UA negative    Leukocytes, UA Moderate (2+) (A) Negative   Appearance     Odor         Assessment & Plan:   1. Acute cystitis with hematuria - UA and symptoms consistent with likely UTI - no signs of systemic illness or pyelonephritis - large blood, so will send micro to confimr RBCs - recheck UA in ~6wks to ensure blood clears given h/o bladder mass and lymphoma - will treat as complicated UT given relative immunosuppression - treat with 5 d course of Keflex - send UCx for sensitivities - discussed AZO - discussed return precautions - POCT Urinalysis Dipstick - Urine Culture - Urinalysis,  microscopic only    Meds ordered this encounter  Medications  . cephALEXin (KEFLEX) 500 MG capsule    Sig: Take 1 capsule (500 mg total) by mouth 2 (two) times daily for 5 days.    Dispense:  10 capsule    Refill:  0     Return in about 6 weeks (around 11/27/2018) for Urine rechecked.   The entirety of the information documented in the History of Present Illness, Review of Systems and Physical Exam were personally obtained by me. Portions of this information were initially documented by Tiburcio Pea, CMA and reviewed by me for thoroughness and accuracy.    Virginia Crews, MD, MPH [facility] 10/16/2018 4:36 PM

## 2018-10-16 NOTE — Patient Instructions (Signed)

## 2018-10-17 LAB — URINALYSIS, MICROSCOPIC ONLY
CASTS: NONE SEEN /LPF
WBC, UA: >30 /hpf — AB (ref 0–5)

## 2018-10-18 LAB — URINE CULTURE

## 2018-10-19 ENCOUNTER — Other Ambulatory Visit: Payer: Self-pay | Admitting: Family Medicine

## 2018-10-19 MED ORDER — SULFAMETHOXAZOLE-TRIMETHOPRIM 800-160 MG PO TABS: 1.0000 | ORAL_TABLET | Freq: Two times a day (BID) | ORAL | 0 refills | Status: AC

## 2018-10-21 DIAGNOSIS — L570 Actinic keratosis: Secondary | ICD-10-CM | POA: Diagnosis not present

## 2018-10-21 DIAGNOSIS — Z1283 Encounter for screening for malignant neoplasm of skin: Secondary | ICD-10-CM | POA: Diagnosis not present

## 2018-10-21 DIAGNOSIS — L820 Inflamed seborrheic keratosis: Secondary | ICD-10-CM | POA: Diagnosis not present

## 2018-10-21 DIAGNOSIS — L821 Other seborrheic keratosis: Secondary | ICD-10-CM | POA: Diagnosis not present

## 2018-10-21 DIAGNOSIS — Z85828 Personal history of other malignant neoplasm of skin: Secondary | ICD-10-CM | POA: Diagnosis not present

## 2018-10-28 ENCOUNTER — Other Ambulatory Visit: Payer: Self-pay | Admitting: Family Medicine

## 2018-11-30 DIAGNOSIS — H524 Presbyopia: Secondary | ICD-10-CM | POA: Diagnosis not present

## 2018-12-04 DIAGNOSIS — H903 Sensorineural hearing loss, bilateral: Secondary | ICD-10-CM | POA: Diagnosis not present

## 2018-12-04 DIAGNOSIS — Z8572 Personal history of non-Hodgkin lymphomas: Secondary | ICD-10-CM | POA: Diagnosis not present

## 2018-12-10 ENCOUNTER — Other Ambulatory Visit: Payer: Self-pay

## 2018-12-10 ENCOUNTER — Encounter: Payer: Self-pay | Admitting: Radiation Oncology

## 2018-12-10 ENCOUNTER — Ambulatory Visit
Admission: RE | Admit: 2018-12-10 | Discharge: 2018-12-10 | Disposition: A | Discharge status: Home / Self Care | Payer: 59 | Source: Ambulatory Visit | Attending: Radiation Oncology | Admitting: Radiation Oncology

## 2018-12-10 VITALS — BP 100/64 | HR 79 | Temp 97.7°F | Resp 16 | Ht 62.0 in | Wt 150.6 lb

## 2018-12-10 DIAGNOSIS — Z8572 Personal history of non-Hodgkin lymphomas: Secondary | ICD-10-CM | POA: Insufficient documentation

## 2018-12-10 DIAGNOSIS — C8331 Diffuse large B-cell lymphoma, lymph nodes of head, face, and neck: Secondary | ICD-10-CM

## 2018-12-10 DIAGNOSIS — Z923 Personal history of irradiation: Secondary | ICD-10-CM | POA: Insufficient documentation

## 2018-12-10 NOTE — Progress Notes (Signed)
Radiation Oncology Follow up Note  Name: Victoria Benson   Date:   12/10/2018 MRN:  [id number] DOB: [birth year]    This 62 y.o. female presents to the clinic today for six-month follow-up status post radiation therapy to her right neck for involved field treatment of diffuse B-cell lymphoma status post R CHOP chemotherapy.  REFERRING PROVIDER: Virginia Crews, MD  HPI: patient is a 62 year old female now seen out close to 6 months having completed involved field radiation therapy to her right neck for a diffuse B-cell lymphoma status post R CHOP chemotherapy. She seen today in routine follow-up is doing extremely well. She specifically denies dysphagia head and neck pain or any new nodularity or masses..she had a CT scan which I have reviewed back in August showing no residual or recurrent lymphadenopathy in the neck. She scheduled for PET CT scan in the future  COMPLICATIONS OF TREATMENT: none  FOLLOW UP COMPLIANCE: keeps appointments   PHYSICAL EXAM:  BP 100/64 (BP Location: Right Arm, Patient Position: Sitting, Cuff Size: Normal)   Pulse 79   Temp 97.7 F (36.5 C) (Tympanic)   Resp 16   Ht 5\' 2"  (1.575 m)   Wt 150 lb 9.2 oz (68.3 kg)   BMI 27.54 kg/m  Oral cavity is clear no oral mucosal lesions are identified. Neck is clear without evidence of subdigastric cervical or supraclavicular adenopathy. No other peripheral adenopathy is noted.Well-developed well-nourished patient in NAD. HEENT reveals PERLA, EOMI, discs not visualized.  Oral cavity is clear. No oral mucosal lesions are identified. Neck is clear without evidence of cervical or supraclavicular adenopathy. Lungs are clear to A&P. Cardiac examination is essentially unremarkable with regular rate and rhythm without murmur rub or thrill. Abdomen is benign with no organomegaly or masses noted. Motor sensory and DTR levels are equal and symmetric in the upper and lower extremities. Cranial nerves II through XII are grossly intact.  Proprioception is intact. No peripheral adenopathy or edema is identified. No motor or sensory levels are noted. Crude visual fields are within normal range.  RADIOLOGY RESULTS: CT scan is reviewed and compatible with the above-stated findings  PLAN: present time patient is doing well with no evidence of disease. I would review any PET scans performed the near future. I've asked to see her back in 1 year for follow-up. She continues close follow-up care with medical oncology. Patient is to call with any concerns.  I would like to take this opportunity to thank you for allowing me to participate in the care of your patient.Noreene Filbert, MD

## 2019-01-03 IMAGING — US US BIOPSY LYMPH NODE
1 series · 12 of 12 positions shown · non-contrast
Comparison: none

CLINICAL DATA: Right cervical adenopathy. History of breast
carcinoma.

[Series 1: us biopsy lymph node · 0.06mm/px · 12 of 12 slices shown]
[im 1/12]
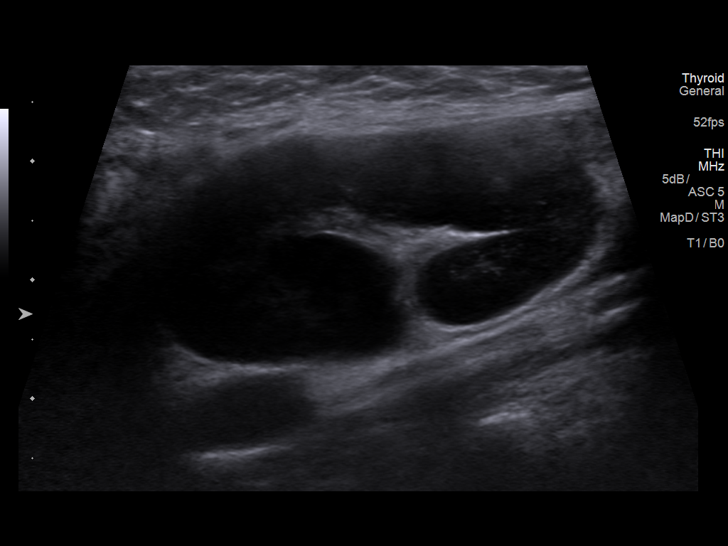
[im 2/12]
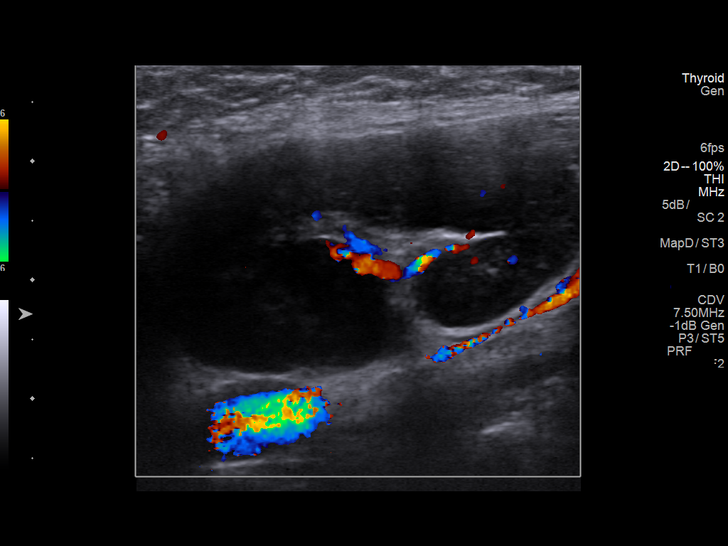
[im 3/12]
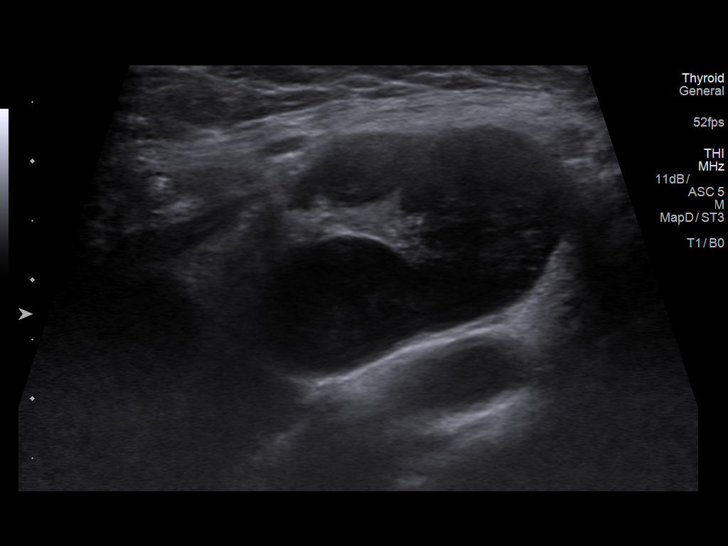
[im 4/12]
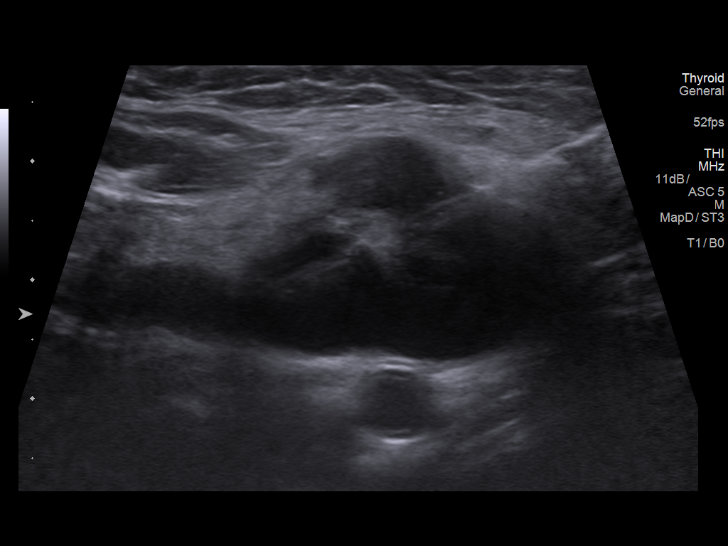
[im 5/12]
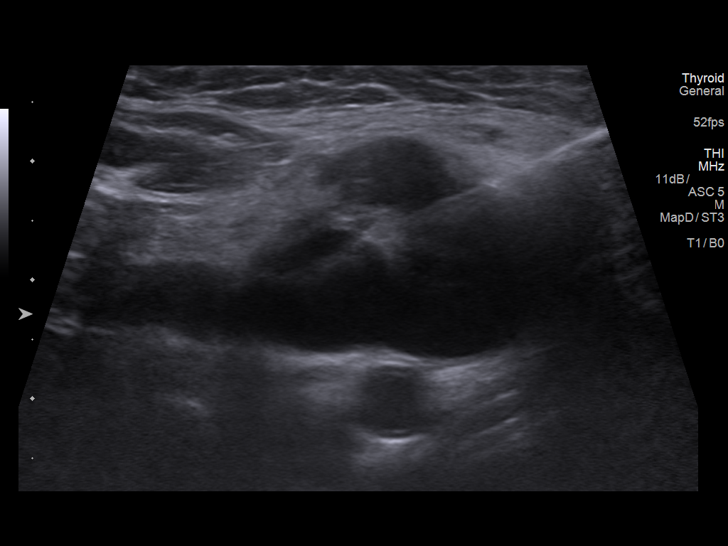
[im 6/12]
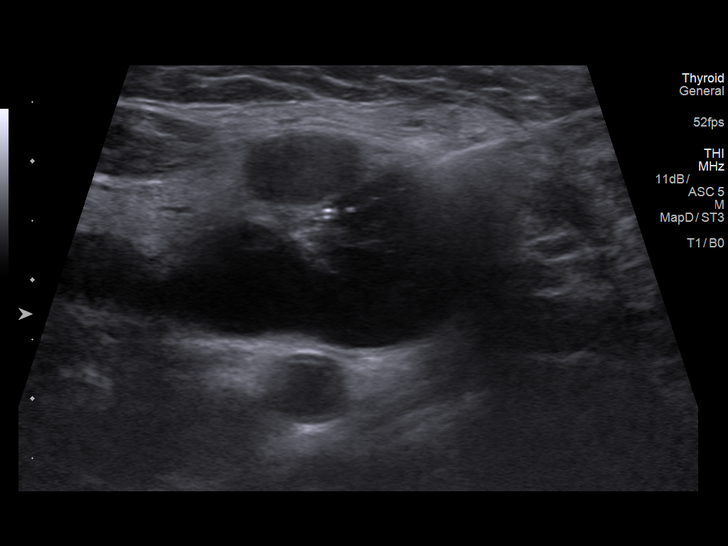
[im 7/12]
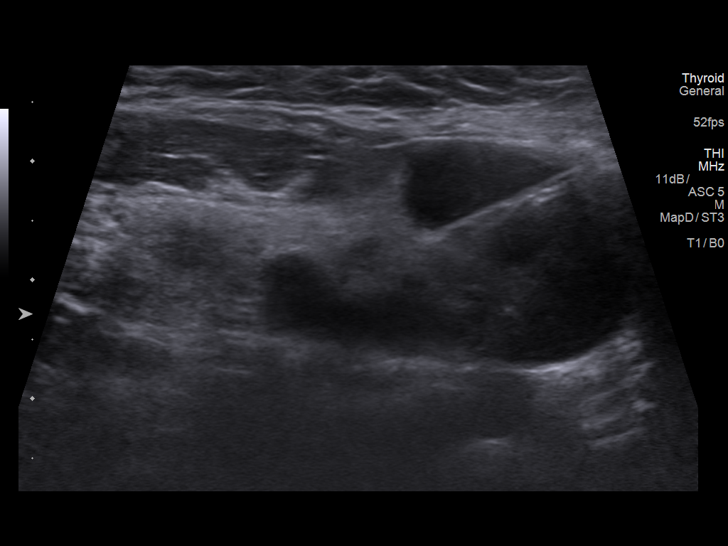
[im 8/12]
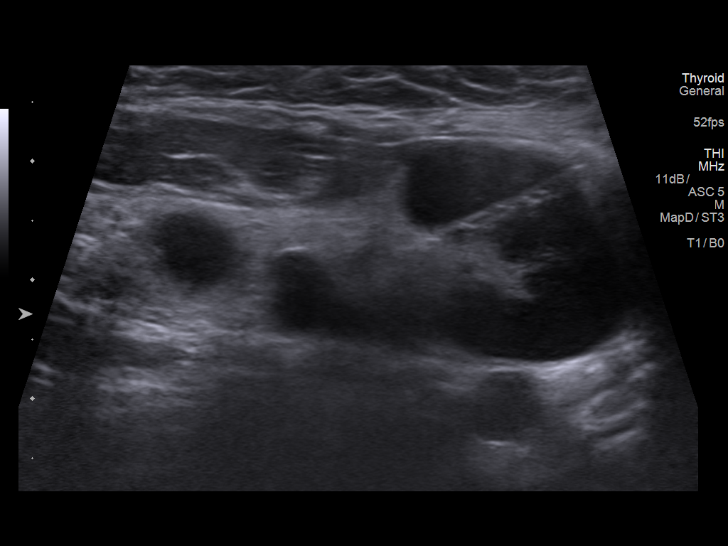
[im 9/12]
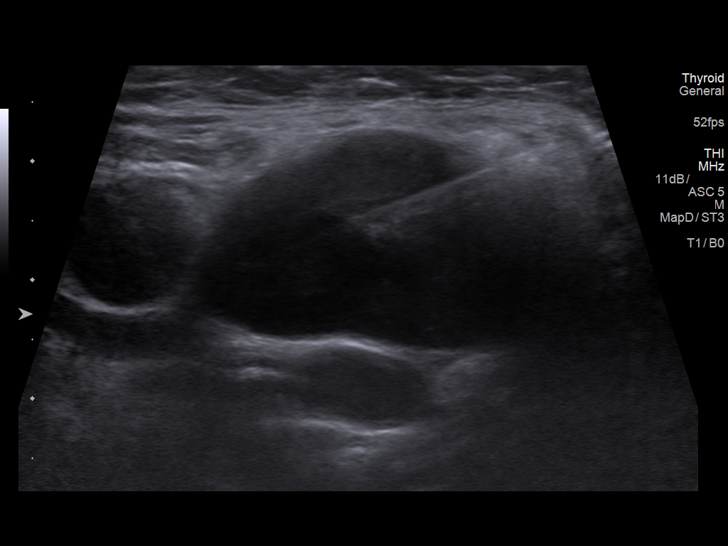
[im 10/12]
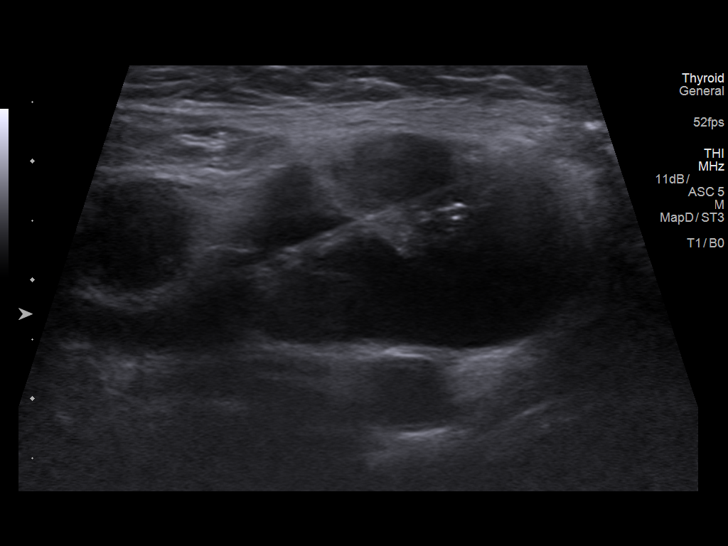
[im 11/12]
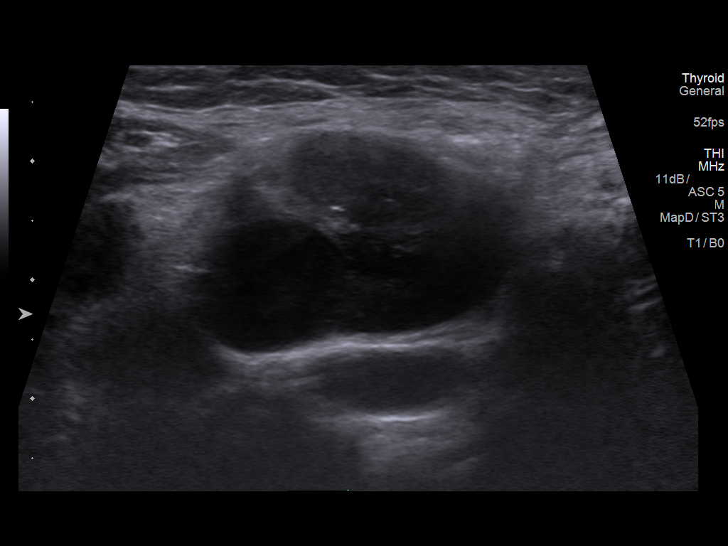
[im 12/12]
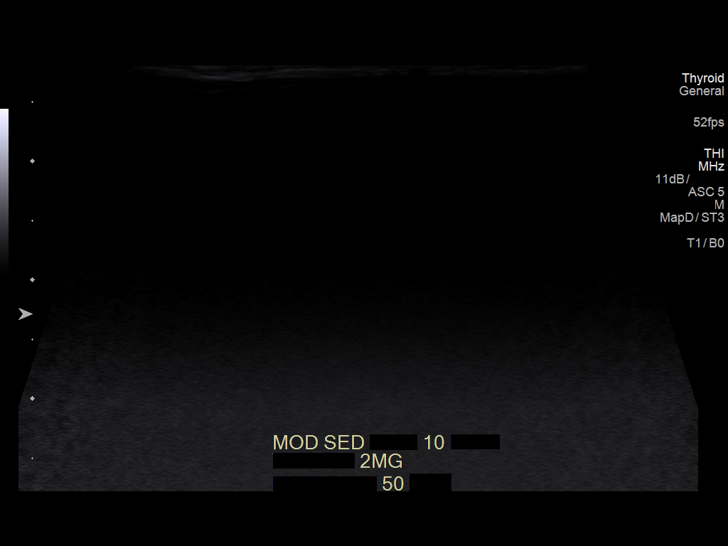

[12 of 12 positions shown; findings below may reference images not displayed]

EXAM:
ULTRASOUND GUIDED CORE BIOPSY OF RIGHT CERVICAL ADENOPATHY

MEDICATIONS:
Intravenous Fentanyl and Versed were administered as conscious
sedation during continuous monitoring of the patient's level of
consciousness and physiological / cardiorespiratory status by the
radiology RN, with a total moderate sedation time of 20 minutes.

PROCEDURE:
The procedure, risks, benefits, and alternatives were explained to
the patient. Questions regarding the procedure were encouraged and
answered. The patient understands and consents to the procedure.

Survey ultrasound of the right cervical region was performed and the
level [DATE] adenopathy was localized. An appropriate skin entry site
was determined and marked.

The operative field was prepped with chlorhexidine in a sterile
fashion, and a sterile drape was applied covering the operative
field. A sterile gown and sterile gloves were used for the
procedure. Local anesthesia was provided with 1% Lidocaine.

Under real-time ultrasound guidance, a 17 gauge trocar needle was
advanced to the margin of the lesion. Once needle tip position was
confirmed, coaxial 18-gauge core biopsy samples were obtained,
submitted in saline to surgical pathology. The guide needle was
removed. Postprocedure scans show no hemorrhage or other apparent
complication. The patient tolerated the procedure well.

COMPLICATIONS:
None.
FINDINGS: Hypoechoic right cervical adenopathy was again localized
corresponding to CT findings. Representative core biopsies obtained
as above.
IMPRESSION: 1. Technically successful ultrasound-guided core biopsy, right
cervical adenopathy.

## 2019-01-05 ENCOUNTER — Inpatient Hospital Stay: Payer: 59 | Attending: Oncology | Admitting: Oncology

## 2019-01-05 ENCOUNTER — Other Ambulatory Visit: Payer: Self-pay

## 2019-01-05 ENCOUNTER — Inpatient Hospital Stay: Payer: 59

## 2019-01-05 ENCOUNTER — Encounter: Payer: Self-pay | Admitting: Oncology

## 2019-01-05 VITALS — BP 91/59 | HR 82 | Temp 97.1°F | Resp 12 | Ht 62.0 in | Wt 159.9 lb

## 2019-01-05 DIAGNOSIS — Z85828 Personal history of other malignant neoplasm of skin: Secondary | ICD-10-CM | POA: Diagnosis not present

## 2019-01-05 DIAGNOSIS — Z801 Family history of malignant neoplasm of trachea, bronchus and lung: Secondary | ICD-10-CM | POA: Insufficient documentation

## 2019-01-05 DIAGNOSIS — Z8579 Personal history of other malignant neoplasms of lymphoid, hematopoietic and related tissues: Secondary | ICD-10-CM

## 2019-01-05 DIAGNOSIS — C8331 Diffuse large B-cell lymphoma, lymph nodes of head, face, and neck: Secondary | ICD-10-CM | POA: Diagnosis present

## 2019-01-05 DIAGNOSIS — Z8041 Family history of malignant neoplasm of ovary: Secondary | ICD-10-CM | POA: Diagnosis not present

## 2019-01-05 DIAGNOSIS — Z08 Encounter for follow-up examination after completed treatment for malignant neoplasm: Secondary | ICD-10-CM

## 2019-01-05 DIAGNOSIS — Z803 Family history of malignant neoplasm of breast: Secondary | ICD-10-CM | POA: Insufficient documentation

## 2019-01-05 DIAGNOSIS — Z923 Personal history of irradiation: Secondary | ICD-10-CM | POA: Diagnosis not present

## 2019-01-05 DIAGNOSIS — R011 Cardiac murmur, unspecified: Secondary | ICD-10-CM | POA: Diagnosis not present

## 2019-01-05 LAB — COMPREHENSIVE METABOLIC PANEL
ALBUMIN: 4.2 g/dL (ref 3.5–5.0)
ALT: 20 U/L (ref 0–44)
ANION GAP: 7 (ref 5–15)
AST: 19 U/L (ref 15–41)
Alkaline Phosphatase: 88 U/L (ref 38–126)
BILIRUBIN TOTAL: 0.8 mg/dL (ref 0.3–1.2)
BUN: 13 mg/dL (ref 8–23)
CO2: 33 mmol/L — AB (ref 22–32)
Calcium: 9.7 mg/dL (ref 8.9–10.3)
Chloride: 103 mmol/L (ref 98–111)
Creatinine, Ser: 0.56 mg/dL (ref 0.44–1.00)
GFR calc non Af Amer: >60 mL/min (ref >60)
Glucose, Bld: 109 mg/dL — ABNORMAL HIGH (ref 70–99)
POTASSIUM: 4.1 mmol/L (ref 3.5–5.1)
Sodium: 143 mmol/L (ref 135–145)
TOTAL PROTEIN: 6.8 g/dL (ref 6.5–8.1)

## 2019-01-05 LAB — CBC WITH DIFFERENTIAL/PLATELET
ABS IMMATURE GRANULOCYTES: 0.01 10*3/uL (ref 0.00–0.07)
BASOS ABS: 0 10*3/uL (ref 0.0–0.1)
BASOS PCT: 1 %
EOS ABS: 0.1 10*3/uL (ref 0.0–0.5)
Eosinophils Relative: 2 %
HCT: 40.3 % (ref 36.0–46.0)
Hemoglobin: 13.5 g/dL (ref 12.0–15.0)
IMMATURE GRANULOCYTES: 0 %
Lymphocytes Relative: 18 %
Lymphs Abs: 0.9 10*3/uL (ref 0.7–4.0)
MCH: 29.4 pg (ref 26.0–34.0)
MCHC: 33.5 g/dL (ref 30.0–36.0)
MCV: 87.8 fL (ref 80.0–100.0)
Monocytes Absolute: 0.5 10*3/uL (ref 0.1–1.0)
Monocytes Relative: 10 %
NEUTROS ABS: 3.6 10*3/uL (ref 1.7–7.7)
NEUTROS PCT: 69 %
NRBC: 0 % (ref 0.0–0.2)
PLATELETS: 165 10*3/uL (ref 150–400)
RBC: 4.59 MIL/uL (ref 3.87–5.11)
RDW: 12.5 % (ref 11.5–15.5)
WBC: 5.2 10*3/uL (ref 4.0–10.5)

## 2019-01-05 LAB — LACTATE DEHYDROGENASE: LDH: 111 U/L (ref 98–192)

## 2019-01-05 NOTE — Progress Notes (Signed)
Patient here for follow up. She reports a mouth infection in December that resolved with the use of [facility] mouthwash.

## 2019-01-06 DIAGNOSIS — H903 Sensorineural hearing loss, bilateral: Secondary | ICD-10-CM | POA: Diagnosis not present

## 2019-01-07 NOTE — Progress Notes (Signed)
Hematology/Oncology Consult note [facility]  [phone] [phone]  Patient Care Team: Virginia Crews, MD as PCP - General (Family Medicine) Margarita Rana, MD as Referring Physician (Family Medicine) Bary Castilla Forest Gleason, MD (General Surgery) Sindy Guadeloupe, MD as Consulting Physician (Oncology) Noreene Filbert, MD as Referring Physician (Radiation Oncology)   Name of the patient: Victoria Benson  [id number]  Jul 14, 1956   Date of visit: 01/07/19  Diagnosis-  Stage II E DLBCL. ABCsubtype. Not double hit.S/p 3 cycles of RCHOP and IFRT   Chief complaint/ Reason for visit-routine follow-up of DLBCL  Heme/Onc history: patient is a 63 year old female who developed symptoms of upper respiratory infection and nasal congestion sometime in November 2018. Around the same time she also developed a right neck mass which was initially attributed to possible tonsillitis and she was given antibiotics for the same. However her right neck mass did not decrease in size and she eventually saw Dr. Pryor Ochoa from ENT. Patient was found to have significant right cervical adenopathy and underwent CT of the soft tissue neck which showed right level 2 and 3 adenopathy favoring metastatic carcinoma. Jugulodigastric node measuring up to 3.7 cm. Asymmetric fullness of the right tonsillar fossa possibly primary site.  Patient underwent core biopsy of the right cervical lymph node.Pathology showed diffuse large B-cell lymphoma of the ABC subtype. IHC was positive for BCL-2 and BCL 6 see Mick IHC was not tested due to insufficiency of the testing sample. Ki-67 was high 80-90%.  Patient reports feeling well prior to November 2018. Currently she denies any fevers, chills, unintentional weight loss or drenching night sweats. She reports some difficulty swallowing because of the right neck mass but denies any shortness of breath. She did receive 1 dose of 100 mg  prednisone yesterday and reports that her mass feels smaller and swallowing is easier.  PET/CT scan showed hypermetabolism in the tonsillar fossa bilaterally right greater than left as well as hypermetabolic lymphadenopathy in the right neck at the level 2 and level 3. No evidence of contralateral cervical adenopathy and no evidence of metastatic disease elsewhere  Bone marrow biopsy showed no evidence of lymphoma. IPI score 0- low risk. Does not meet criteria for CNS prophylaxis  Patient finished 3 cycles of RCHOP on 02/27/18.PET showed no active disease. She then completed IFRT  Interval history-she feels well.  Her appetite is good and she denies any unintentional weight loss.  Denies any lumps or bumps anywhere.  Denies any fatigue or drenching night sweats  ECOG PS- 0 Pain scale- 0   Review of systems- Review of Systems  Constitutional: Negative for chills, fever, malaise/fatigue and weight loss.  HENT: Negative for congestion, ear discharge and nosebleeds.   Eyes: Negative for blurred vision.  Respiratory: Negative for cough, hemoptysis, sputum production, shortness of breath and wheezing.   Cardiovascular: Negative for chest pain, palpitations, orthopnea and claudication.  Gastrointestinal: Negative for abdominal pain, blood in stool, constipation, diarrhea, heartburn, melena, nausea and vomiting.  Genitourinary: Negative for dysuria, flank pain, frequency, hematuria and urgency.  Musculoskeletal: Negative for back pain, joint pain and myalgias.  Skin: Negative for rash.  Neurological: Negative for dizziness, tingling, focal weakness, seizures, weakness and headaches.  Endo/Heme/Allergies: Does not bruise/bleed easily.  Psychiatric/Behavioral: Negative for depression and suicidal ideas. The patient does not have insomnia.       No Known Allergies   Past Medical History:  Diagnosis Date  . BRCA gene mutation negative 07/10/2016  . Breast  cancer ([place]) 2009   T1c (1.2 cm)  N0 ER 90%, PR 90%, HER-2/neu not amplified. Oncotype DX score, low risk (14) 9% chance of recurrence with antiestrogen therapy alone.  . Breast cancer ([place]) 06-17-16   INVASIVE MAMMARY CARCINOMA . T1, ER positive, PR positive, HER-2/neu not overexpressed  . Diffuse large B-cell lymphoma ([place]) 11/2017   Right cervical adenopathy  . Family history of adverse reaction to anesthesia    PTS MOM-HARD TO WAKE UP  . Heart murmur   . Radiation 2009   BREAST CA  . Skin cancer      Past Surgical History:  Procedure Laterality Date  . APPENDECTOMY    . BREAST BIOPSY Right 06/17/2016   invasive. T1, ER positive, PR positive, HER-2/neu not overexpressed  . BREAST CYST ASPIRATION Left 2002  . BREAST EXCISIONAL BIOPSY Right 06/2016   lumpectomy with rad   . BREAST EXCISIONAL BIOPSY Right 2009   breast ca + mammo site radiation  . BREAST LUMPECTOMY WITH NEEDLE LOCALIZATION Right 07/18/2016   Procedure: BREAST LUMPECTOMY WITH NEEDLE LOCALIZATION AND RIGHT AXILLARY EXPLORATION;  Surgeon: Robert Bellow, MD;  Location: [facility] ORS;  Service: General;  Laterality: Right;  . BREAST SURGERY Right July 2009   Wide excision, sentinel left node biopsy MammoSite. ( 06/21/2008  . CHOLECYSTECTOMY  1984  . COLONOSCOPY WITH PROPOFOL N/A 10/13/2018   Procedure: COLONOSCOPY WITH PROPOFOL;  Surgeon: Lucilla Lame, MD;  Location: [facility] ENDOSCOPY;  Service: Endoscopy;  Laterality: N/A;  . DILATION AND CURETTAGE OF UTERUS  2004  . ENDOMETRIAL ABLATION  2005  . IR FLUORO GUIDE PORT INSERTION RIGHT  01/15/2018  . TUMOR REMOVAL  12/2013   from uterus done by [facility]    Social History   Socioeconomic History  . Marital status: Married    Spouse name: [name]  . Number of children: 2  . Years of education: 75  . Highest education level: Not on file  Occupational History  . Occupation: unemployed  Social Needs  . Financial resource strain: Not on file  . Food insecurity:    Worry: Not on file    Inability: Not  on file  . Transportation needs:    Medical: Not on file    Non-medical: Not on file  Tobacco Use  . Smoking status: Never Smoker  . Smokeless tobacco: Never Used  Substance and Sexual Activity  . Alcohol use: No  . Drug use: No  . Sexual activity: Yes  Lifestyle  . Physical activity:    Days per week: Not on file    Minutes per session: Not on file  . Stress: Not on file  Relationships  . Social connections:    Talks on phone: Not on file    Gets together: Not on file    Attends religious service: Not on file    Active member of club or organization: Not on file    Attends meetings of clubs or organizations: Not on file    Relationship status: Not on file  . Intimate partner violence:    Fear of current or ex partner: Not on file    Emotionally abused: Not on file    Physically abused: Not on file    Forced sexual activity: Not on file  Other Topics Concern  . Not on file  Social History Narrative  . Not on file    Family History  Problem Relation Age of Onset  . CAD Mother   . Osteoporosis Mother   .  Lung cancer Father   . Diabetes Father   . Hypertension Brother   . Ovarian cancer Maternal Grandmother   . Heart attack Paternal Grandmother   . Diabetes Paternal Grandfather   . Fibromyalgia Brother   . Hypertension Brother   . Hypertension Brother   . Sleep apnea Brother   . Healthy Brother   . Sleep apnea Brother   . Ovarian cancer Other   . Breast cancer Neg Hx      Current Outpatient Medications:  .  anastrozole (ARIMIDEX) 1 MG tablet, TAKE ONE TABLET BY MOUTH EVERY DAY, Disp: 90 tablet, Rfl: 3 .  CALCIUM-MAGNESIUM-VITAMIN D ER PO, Take 1 tablet by mouth daily., Disp: , Rfl:  .  cetirizine (ZYRTEC) 10 MG tablet, Take 10 mg daily by mouth., Disp: , Rfl:  .  fluticasone (FLONASE) 50 MCG/ACT nasal spray, 2 SPRAYS IN EACH NOSTRIL EVERY DAY, Disp: 16 g, Rfl: 6 .  Multiple Vitamin (MULTIVITAMIN) capsule, Take 1 capsule by mouth daily., Disp: , Rfl:  .   Omega-3 Krill Oil 500 MG CAPS, Take 1,000 mg by mouth daily. , Disp: , Rfl:   Physical exam:  Vitals:   01/05/19 1406 01/05/19 1409  BP:  (!) 91/59  Pulse:  82  Resp: 12   Temp:  (!) 97.1 F (36.2 C)  TempSrc:  Tympanic  Weight: 159 lb 14.4 oz (72.5 kg)   Height: 5' 2"  (1.575 m)    Physical Exam Constitutional:      General: She is not in acute distress. HENT:     Head: Normocephalic and atraumatic.  Eyes:     Pupils: Pupils are equal, round, and reactive to light.  Neck:     Musculoskeletal: Normal range of motion.  Cardiovascular:     Rate and Rhythm: Normal rate and regular rhythm.     Heart sounds: Normal heart sounds.  Pulmonary:     Effort: Pulmonary effort is normal.     Breath sounds: Normal breath sounds.  Abdominal:     General: Bowel sounds are normal.     Palpations: Abdomen is soft.  Lymphadenopathy:     Comments: No palpable cervical, supraclavicular, axillary or inguinal adenopathy   Skin:    General: Skin is warm and dry.  Neurological:     Mental Status: She is alert and oriented to person, place, and time.      CMP Latest Ref Rng & Units 01/05/2019  Glucose 70 - 99 mg/dL 109(H)  BUN 8 - 23 mg/dL 13  Creatinine 0.44 - 1.00 mg/dL 0.56  Sodium 135 - 145 mmol/L 143  Potassium 3.5 - 5.1 mmol/L 4.1  Chloride 98 - 111 mmol/L 103  CO2 22 - 32 mmol/L 33(H)  Calcium 8.9 - 10.3 mg/dL 9.7  Total Protein 6.5 - 8.1 g/dL 6.8  Total Bilirubin 0.3 - 1.2 mg/dL 0.8  Alkaline Phos 38 - 126 U/L 88  AST 15 - 41 U/L 19  ALT 0 - 44 U/L 20   CBC Latest Ref Rng & Units 01/05/2019  WBC 4.0 - 10.5 K/uL 5.2  Hemoglobin 12.0 - 15.0 g/dL 13.5  Hematocrit 36.0 - 46.0 % 40.3  Platelets 150 - 400 K/uL 165     Assessment and plan- Patient is a 63 y.o. female with Stage II E DLBCL abc subtypenot double hit. Low risk IPI score 0s/p 3cycles of RCHOP chemotherapyand IFRT in CR1.   Patient is here for routine follow-up of her DLBCL  Clinically she is doing well  and there  is no evidence of recurrence on today's exam.  She does not have any B symptoms are no palpable adenopathy.  Her labs are stable.  She will continue with routine surveillance visits every 3 months and I will see her back in 3 months with a CBC CMP and LDH   Visit Diagnosis 1. Encounter for follow-up surveillance of diffuse large B-cell lymphoma      Dr. Randa Evens, MD, MPH [facility] at [facility] [phone] 01/07/2019 8:33 AM

## 2019-03-15 ENCOUNTER — Other Ambulatory Visit: Payer: Self-pay | Admitting: General Surgery

## 2019-03-15 DIAGNOSIS — C50211 Malignant neoplasm of upper-inner quadrant of right female breast: Secondary | ICD-10-CM

## 2019-03-15 DIAGNOSIS — Z170 Estrogen receptor positive status [ER+]: Principal | ICD-10-CM

## 2019-03-18 ENCOUNTER — Other Ambulatory Visit: Payer: Self-pay | Admitting: General Surgery

## 2019-03-18 DIAGNOSIS — Z170 Estrogen receptor positive status [ER+]: Principal | ICD-10-CM

## 2019-03-18 DIAGNOSIS — C50211 Malignant neoplasm of upper-inner quadrant of right female breast: Secondary | ICD-10-CM

## 2019-04-06 ENCOUNTER — Ambulatory Visit: Payer: 59 | Admitting: Oncology

## 2019-04-06 ENCOUNTER — Other Ambulatory Visit: Payer: 59

## 2019-05-09 ENCOUNTER — Other Ambulatory Visit: Payer: Self-pay | Admitting: *Deleted

## 2019-05-09 DIAGNOSIS — C8331 Diffuse large B-cell lymphoma, lymph nodes of head, face, and neck: Secondary | ICD-10-CM

## 2019-05-11 ENCOUNTER — Ambulatory Visit: Payer: 59 | Admitting: Oncology

## 2019-05-11 ENCOUNTER — Inpatient Hospital Stay: Payer: 59 | Attending: Oncology

## 2019-05-11 ENCOUNTER — Other Ambulatory Visit: Payer: Self-pay

## 2019-05-11 DIAGNOSIS — C8331 Diffuse large B-cell lymphoma, lymph nodes of head, face, and neck: Secondary | ICD-10-CM

## 2019-05-11 DIAGNOSIS — C8338 Diffuse large B-cell lymphoma, lymph nodes of multiple sites: Secondary | ICD-10-CM | POA: Insufficient documentation

## 2019-05-11 LAB — CBC WITH DIFFERENTIAL/PLATELET
Abs Immature Granulocytes: 0.01 10*3/uL (ref 0.00–0.07)
Basophils Absolute: 0 10*3/uL (ref 0.0–0.1)
Basophils Relative: 1 %
Eosinophils Absolute: 0.1 10*3/uL (ref 0.0–0.5)
Eosinophils Relative: 2 %
HCT: 42.6 % (ref 36.0–46.0)
Hemoglobin: 14.2 g/dL (ref 12.0–15.0)
Immature Granulocytes: 0 %
Lymphocytes Relative: 21 %
Lymphs Abs: 1 10*3/uL (ref 0.7–4.0)
MCH: 29.7 pg (ref 26.0–34.0)
MCHC: 33.3 g/dL (ref 30.0–36.0)
MCV: 89.1 fL (ref 80.0–100.0)
Monocytes Absolute: 0.5 10*3/uL (ref 0.1–1.0)
Monocytes Relative: 10 %
Neutro Abs: 3.1 10*3/uL (ref 1.7–7.7)
Neutrophils Relative %: 66 %
Platelets: 153 10*3/uL (ref 150–400)
RBC: 4.78 MIL/uL (ref 3.87–5.11)
RDW: 12.6 % (ref 11.5–15.5)
WBC: 4.6 10*3/uL (ref 4.0–10.5)
nRBC: 0 % (ref 0.0–0.2)

## 2019-05-11 LAB — COMPREHENSIVE METABOLIC PANEL
ALT: 17 U/L (ref 0–44)
AST: 19 U/L (ref 15–41)
Albumin: 4.2 g/dL (ref 3.5–5.0)
Alkaline Phosphatase: 93 U/L (ref 38–126)
Anion gap: 9 (ref 5–15)
BUN: 9 mg/dL (ref 8–23)
CO2: 29 mmol/L (ref 22–32)
Calcium: 9.5 mg/dL (ref 8.9–10.3)
Chloride: 104 mmol/L (ref 98–111)
Creatinine, Ser: 0.62 mg/dL (ref 0.44–1.00)
GFR calc Af Amer: >60 mL/min (ref >60)
GFR calc non Af Amer: >60 mL/min (ref >60)
Glucose, Bld: 106 mg/dL — ABNORMAL HIGH (ref 70–99)
Potassium: 3.9 mmol/L (ref 3.5–5.1)
Sodium: 142 mmol/L (ref 135–145)
Total Bilirubin: 0.7 mg/dL (ref 0.3–1.2)
Total Protein: 6.9 g/dL (ref 6.5–8.1)

## 2019-05-11 LAB — LACTATE DEHYDROGENASE: LDH: 107 U/L (ref 98–192)

## 2019-05-12 ENCOUNTER — Inpatient Hospital Stay (HOSPITAL_BASED_OUTPATIENT_CLINIC_OR_DEPARTMENT_OTHER): Payer: 59 | Admitting: Oncology

## 2019-05-12 ENCOUNTER — Encounter: Payer: Self-pay | Admitting: Oncology

## 2019-05-12 DIAGNOSIS — Z9221 Personal history of antineoplastic chemotherapy: Secondary | ICD-10-CM

## 2019-05-12 DIAGNOSIS — Z79899 Other long term (current) drug therapy: Secondary | ICD-10-CM

## 2019-05-12 DIAGNOSIS — Z8579 Personal history of other malignant neoplasms of lymphoid, hematopoietic and related tissues: Secondary | ICD-10-CM

## 2019-05-12 DIAGNOSIS — Z08 Encounter for follow-up examination after completed treatment for malignant neoplasm: Secondary | ICD-10-CM | POA: Diagnosis not present

## 2019-05-12 NOTE — Progress Notes (Signed)
She has been having sacral pain in last month-she does not feel like she hurt it in anyway. I told her she should try video visit with PCP and if she cont. tohurt and provider could not help then call us back and we will see what we can do to help

## 2019-05-12 NOTE — Progress Notes (Signed)
I connected with Victoria Benson on 05/12/19 at 10:15 AM EDT by video enabled telemedicine visit and verified that I am speaking with the correct person using two identifiers.   I discussed the limitations, risks, security and privacy concerns of performing an evaluation and management service by telemedicine and the availability of in-person appointments. I also discussed with the patient that there may be a patient responsible charge related to this service. The patient expressed understanding and agreed to proceed.  Other persons participating in the visit and their role in the encounter:  none  Patient's location:  home Provider's location:  [facility]  Chief Complaint: Routine follow-up of diffuse large B-cell lymphoma  History of present illness: patient is a 63 year old female who developed symptoms of upper respiratory infection and nasal congestion sometime in November 2018. Around the same time she also developed a right neck mass which was initially attributed to possible tonsillitis and she was given antibiotics for the same. However her right neck mass did not decrease in size and she eventually saw Dr. Pryor Ochoa from ENT. Patient was found to have significant right cervical adenopathy and underwent CT of the soft tissue neck which showed right level 2 and 3 adenopathy favoring metastatic carcinoma. Jugulodigastric node measuring up to 3.7 cm. Asymmetric fullness of the right tonsillar fossa possibly primary site.  Patient underwent core biopsy of the right cervical lymph node.Pathology showed diffuse large B-cell lymphoma of the ABC subtype. IHC was positive for BCL-2 and BCL 6 see Mick IHC was not tested due to insufficiency of the testing sample. Ki-67 was high 80-90%.  Patient reports feeling well prior to November 2018. Currently she denies any fevers, chills, unintentional weight loss or drenching night sweats. She reports some difficulty swallowing because of the right  neck mass but denies any shortness of breath. She did receive 1 dose of 100 mg prednisone yesterday and reports that her mass feels smaller and swallowing is easier.  PET/CT scan showed hypermetabolismin the tonsillar fossa bilaterally right greater than left as well as hypermetabolic lymphadenopathy in the right neck at the level 2 and level 3. No evidence of contralateral cervical adenopathy and no evidence of metastatic disease elsewhere  Bone marrow biopsy showed no evidence of lymphoma. IPI score 0- low risk. Does not meet criteria for CNS prophylaxis  Patient finished 3 cycles of RCHOP on 02/27/18.PET showed no active disease. She then completed IFRT   Interval history: Clinically patient is doing well and reports that her weight is up to 170 pounds.  She has a good appetite and denies any lumps or bumps anywhere or drenching night sweats.  Reports some ongoing tailbone pain for which she is seeing her PCP   Review of Systems  Constitutional: Negative for chills, fever, malaise/fatigue and weight loss.  HENT: Negative for congestion, ear discharge and nosebleeds.   Eyes: Negative for blurred vision.  Respiratory: Negative for cough, hemoptysis, sputum production, shortness of breath and wheezing.   Cardiovascular: Negative for chest pain, palpitations, orthopnea and claudication.  Gastrointestinal: Negative for abdominal pain, blood in stool, constipation, diarrhea, heartburn, melena, nausea and vomiting.  Genitourinary: Negative for dysuria, flank pain, frequency, hematuria and urgency.  Musculoskeletal: Negative for back pain, joint pain and myalgias.  Skin: Negative for rash.  Neurological: Negative for dizziness, tingling, focal weakness, seizures, weakness and headaches.  Endo/Heme/Allergies: Does not bruise/bleed easily.  Psychiatric/Behavioral: Negative for depression and suicidal ideas. The patient does not have insomnia.     No Known  Allergies  Past Medical History:   Diagnosis Date  . BRCA gene mutation negative 07/10/2016  . Breast cancer ([place]) 2009   T1c (1.2 cm) N0 ER 90%, PR 90%, HER-2/neu not amplified. Oncotype DX score, low risk (14) 9% chance of recurrence with antiestrogen therapy alone.  . Breast cancer ([place]) 06-17-16   INVASIVE MAMMARY CARCINOMA . T1, ER positive, PR positive, HER-2/neu not overexpressed  . Diffuse large B-cell lymphoma ([place]) 11/2017   Right cervical adenopathy  . Family history of adverse reaction to anesthesia    PTS MOM-HARD TO WAKE UP  . Heart murmur   . History of basal cell carcinoma 02/11/2011   right upper back  . History of dysplastic nevus 06/20/2011   left scapula  . Radiation 2009   BREAST CA  . Skin cancer     Past Surgical History:  Procedure Laterality Date  . APPENDECTOMY    . BREAST BIOPSY Right 06/17/2016   invasive. T1, ER positive, PR positive, HER-2/neu not overexpressed  . BREAST CYST ASPIRATION Left 2002  . BREAST EXCISIONAL BIOPSY Right 06/2016   lumpectomy with rad   . BREAST EXCISIONAL BIOPSY Right 2009   breast ca + mammo site radiation  . BREAST LUMPECTOMY WITH NEEDLE LOCALIZATION Right 07/18/2016   Procedure: BREAST LUMPECTOMY WITH NEEDLE LOCALIZATION AND RIGHT AXILLARY EXPLORATION;  Surgeon: Robert Bellow, MD;  Location: [facility] ORS;  Service: General;  Laterality: Right;  . BREAST SURGERY Right July 2009   Wide excision, sentinel left node biopsy MammoSite. ( 06/21/2008  . CHOLECYSTECTOMY  1984  . COLONOSCOPY WITH PROPOFOL N/A 10/13/2018   Procedure: COLONOSCOPY WITH PROPOFOL;  Surgeon: Lucilla Lame, MD;  Location: [facility] ENDOSCOPY;  Service: Endoscopy;  Laterality: N/A;  . DILATION AND CURETTAGE OF UTERUS  2004  . ENDOMETRIAL ABLATION  2005  . IR FLUORO GUIDE PORT INSERTION RIGHT  01/15/2018  . TUMOR REMOVAL  12/2013   from uterus done by [facility]    Social History   Socioeconomic History  . Marital status: Married    Spouse name: [name]  . Number of children: 2  . Years  of education: 97  . Highest education level: Not on file  Occupational History  . Occupation: unemployed  Social Needs  . Financial resource strain: Not on file  . Food insecurity:    Worry: Not on file    Inability: Not on file  . Transportation needs:    Medical: Not on file    Non-medical: Not on file  Tobacco Use  . Smoking status: Never Smoker  . Smokeless tobacco: Never Used  Substance and Sexual Activity  . Alcohol use: No  . Drug use: No  . Sexual activity: Yes  Lifestyle  . Physical activity:    Days per week: Not on file    Minutes per session: Not on file  . Stress: Not on file  Relationships  . Social connections:    Talks on phone: Not on file    Gets together: Not on file    Attends religious service: Not on file    Active member of club or organization: Not on file    Attends meetings of clubs or organizations: Not on file    Relationship status: Not on file  . Intimate partner violence:    Fear of current or ex partner: Not on file    Emotionally abused: Not on file    Physically abused: Not on file    Forced sexual activity: Not on  file  Other Topics Concern  . Not on file  Social History Narrative  . Not on file    Family History  Problem Relation Age of Onset  . CAD Mother   . Osteoporosis Mother   . Lung cancer Father   . Diabetes Father   . Hypertension Brother   . Ovarian cancer Maternal Grandmother   . Heart attack Paternal Grandmother   . Diabetes Paternal Grandfather   . Fibromyalgia Brother   . Hypertension Brother   . Hypertension Brother   . Sleep apnea Brother   . Healthy Brother   . Sleep apnea Brother   . Ovarian cancer Other   . Breast cancer Neg Hx      Current Outpatient Medications:  .  anastrozole (ARIMIDEX) 1 MG tablet, TAKE ONE TABLET EVERY DAY, Disp: 90 tablet, Rfl: 3 .  CALCIUM-MAGNESIUM-VITAMIN D ER PO, Take 1 tablet by mouth daily., Disp: , Rfl:  .  cetirizine (ZYRTEC) 10 MG tablet, Take 10 mg daily by  mouth., Disp: , Rfl:  .  fluticasone (FLONASE) 50 MCG/ACT nasal spray, 2 SPRAYS IN EACH NOSTRIL EVERY DAY, Disp: 16 g, Rfl: 6 .  Multiple Vitamin (MULTIVITAMIN) capsule, Take 1 capsule by mouth daily., Disp: , Rfl:  .  Omega-3 Krill Oil 500 MG CAPS, Take 1,000 mg by mouth daily. , Disp: , Rfl:   No results found.  No images are attached to the encounter.   CMP Latest Ref Rng & Units 05/11/2019  Glucose 70 - 99 mg/dL 106(H)  BUN 8 - 23 mg/dL 9  Creatinine 0.44 - 1.00 mg/dL 0.62  Sodium 135 - 145 mmol/L 142  Potassium 3.5 - 5.1 mmol/L 3.9  Chloride 98 - 111 mmol/L 104  CO2 22 - 32 mmol/L 29  Calcium 8.9 - 10.3 mg/dL 9.5  Total Protein 6.5 - 8.1 g/dL 6.9  Total Bilirubin 0.3 - 1.2 mg/dL 0.7  Alkaline Phos 38 - 126 U/L 93  AST 15 - 41 U/L 19  ALT 0 - 44 U/L 17   CBC Latest Ref Rng & Units 05/11/2019  WBC 4.0 - 10.5 K/uL 4.6  Hemoglobin 12.0 - 15.0 g/dL 14.2  Hematocrit 36.0 - 46.0 % 42.6  Platelets 150 - 400 K/uL 153     Observation/objective: Appears in no acute distress of a video visit today.  Breathing is nonlabored  Assessment and plan: Patient is a 63 year old female with a history of stage IIb diffuse large B-cell lymphoma ABC subtype status post 3 cycles of R-CHOP chemotherapy and CR1 currently in CR 1.  This is a routine follow-up visit of her diffuse large B-cell lymphoma.  Based on her history and clinical symptoms there are no concerning findings for recurrence.  CBC, CMP and LDH are unremarkable.  Clinically she is in remission.  She is now 1 year post treatment  Tailbone pain: Unlikely to be lymphoma.  However if her pain gets worse she will let us know and we will consider getting a CT abdomen pelvis at that time  Follow-up instructions: I will see her back in 3 months time with CBC with differential, CMP and LDH  I discussed the assessment and treatment plan with the patient. The patient was provided an opportunity to ask questions and all were answered. The  patient agreed with the plan and demonstrated an understanding of the instructions.   The patient was advised to call back or seek an in-person evaluation if the symptoms worsen or if the condition fails  to improve as anticipated.    Visit Diagnosis: 1. Encounter for follow-up surveillance of diffuse large B-cell lymphoma     Dr. Randa Evens, MD, MPH [facility] at [facility] Pager- [phone] 05/13/2019 8:31 AM

## 2019-06-28 ENCOUNTER — Other Ambulatory Visit: Payer: Self-pay | Admitting: *Deleted

## 2019-06-28 DIAGNOSIS — C50211 Malignant neoplasm of upper-inner quadrant of right female breast: Secondary | ICD-10-CM

## 2019-06-28 DIAGNOSIS — Z170 Estrogen receptor positive status [ER+]: Secondary | ICD-10-CM

## 2019-07-21 ENCOUNTER — Ambulatory Visit
Admission: RE | Admit: 2019-07-21 | Discharge: 2019-07-21 | Disposition: A | Discharge status: Home / Self Care | Payer: 59 | Source: Ambulatory Visit | Attending: General Surgery | Admitting: General Surgery

## 2019-07-21 DIAGNOSIS — C50211 Malignant neoplasm of upper-inner quadrant of right female breast: Secondary | ICD-10-CM | POA: Insufficient documentation

## 2019-07-21 DIAGNOSIS — Z170 Estrogen receptor positive status [ER+]: Secondary | ICD-10-CM

## 2019-08-03 ENCOUNTER — Ambulatory Visit: Payer: 59 | Admitting: General Surgery

## 2019-08-04 ENCOUNTER — Ambulatory Visit (INDEPENDENT_AMBULATORY_CARE_PROVIDER_SITE_OTHER): Payer: 59 | Admitting: Surgery

## 2019-08-04 ENCOUNTER — Encounter: Payer: Self-pay | Admitting: Surgery

## 2019-08-04 ENCOUNTER — Other Ambulatory Visit: Payer: Self-pay

## 2019-08-04 ENCOUNTER — Encounter: Payer: Self-pay | Admitting: *Deleted

## 2019-08-04 VITALS — BP 120/70 | HR 81 | Temp 97.7°F | Ht 62.0 in | Wt 177.0 lb

## 2019-08-04 DIAGNOSIS — R2232 Localized swelling, mass and lump, left upper limb: Secondary | ICD-10-CM | POA: Diagnosis not present

## 2019-08-04 NOTE — Addendum Note (Signed)
Addended by: Caroleen Hamman F on: 08/04/2019 05:16 PM   Modules accepted: Orders, SmartSet

## 2019-08-04 NOTE — Progress Notes (Addendum)
Outpatient Surgical Follow Up  08/04/2019  Victoria Benson is an 63 y.o. female.   Chief Complaint  Patient presents with  . Follow-up    1 yr f/u bil diag mammo, c50.211, [facility], last one 07/21/19     HPI: 63 year old female with a history of ER PR positive HER-2 negative right breast cancer status post lumpectomy and radiation therapy.  She was diagnosed with large B-cell lymphoma on the right cervical lymph node. S/p chemo.  She now is coming for yearly mammogram as well as physical exam for breast cancer.  She specifically denies any pain any new lumps.  She does report having a cyst on the left axilla that sometimes she experience some intermittent mild to moderate sharp pains and has some drainage.  She has had previous ultrasounds in the past PET/CT there is no evidence of malignancy the ultrasound I personally reviewed showing evidence of lipoma.  She did have a most recent mammogram showing no evidence of primary breast issues.  Past Medical History:  Diagnosis Date  . BRCA gene mutation negative 07/10/2016  . Breast cancer ([place]) 2009   T1c (1.2 cm) N0 ER 90%, PR 90%, HER-2/neu not amplified. Oncotype DX score, low risk (14) 9% chance of recurrence with antiestrogen therapy alone.  . Breast cancer ([place]) 06-17-16   INVASIVE MAMMARY CARCINOMA . T1, ER positive, PR positive, HER-2/neu not overexpressed  . Diffuse large B-cell lymphoma ([place]) 11/2017   Right cervical adenopathy  . Family history of adverse reaction to anesthesia    PTS MOM-HARD TO WAKE UP  . Heart murmur   . History of basal cell carcinoma 02/11/2011   right upper back  . History of dysplastic nevus 06/20/2011   left scapula  . Personal history of chemotherapy 2019   lymphoma  . Personal history of radiation therapy 2009   right breast cancer  . Personal history of radiation therapy 2017   right breast cancer  . Radiation 2009   BREAST CA  . Skin cancer     Past Surgical History:  Procedure Laterality Date  .  APPENDECTOMY    . BREAST BIOPSY Right 06/17/2016   invasive. T1, ER positive, PR positive, HER-2/neu not overexpressed  . BREAST CYST ASPIRATION Left 2002  . BREAST EXCISIONAL BIOPSY Right 06/2016   lumpectomy with rad   . BREAST EXCISIONAL BIOPSY Right 2009   breast ca + mammo site radiation  . BREAST LUMPECTOMY WITH NEEDLE LOCALIZATION Right 07/18/2016   Procedure: BREAST LUMPECTOMY WITH NEEDLE LOCALIZATION AND RIGHT AXILLARY EXPLORATION;  Surgeon: Robert Bellow, MD;  Location: [facility] ORS;  Service: General;  Laterality: Right;  . BREAST SURGERY Right July 2009   Wide excision, sentinel left node biopsy MammoSite. ( 06/21/2008  . CHOLECYSTECTOMY  1984  . COLONOSCOPY WITH PROPOFOL N/A 10/13/2018   Procedure: COLONOSCOPY WITH PROPOFOL;  Surgeon: Lucilla Lame, MD;  Location: [facility] ENDOSCOPY;  Service: Endoscopy;  Laterality: N/A;  . DILATION AND CURETTAGE OF UTERUS  2004  . ENDOMETRIAL ABLATION  2005  . IR FLUORO GUIDE PORT INSERTION RIGHT  01/15/2018  . TUMOR REMOVAL  12/2013   from uterus done by [facility]    Family History  Problem Relation Age of Onset  . CAD Mother   . Osteoporosis Mother   . Lung cancer Father   . Diabetes Father   . Hypertension Brother   . Ovarian cancer Maternal Grandmother   . Heart attack Paternal Grandmother   . Diabetes Paternal Grandfather   .  Fibromyalgia Brother   . Hypertension Brother   . Hypertension Brother   . Sleep apnea Brother   . Healthy Brother   . Sleep apnea Brother   . Ovarian cancer Other   . Breast cancer Neg Hx     Social History:  reports that she has never smoked. She has never used smokeless tobacco. She reports that she does not drink alcohol or use drugs.  Allergies: No Known Allergies  Medications reviewed.    ROS Full ROS performed and is otherwise negative other than what is stated in HPI   BP 120/70   Pulse 81   Temp 97.7 F (36.5 C) (Temporal)   Ht _0  (1.575 m)   Wt 177 lb (80.3 kg)   SpO2 97%    BMI 32.37 kg/m   Physical Exam Vitals signs and nursing note reviewed. Exam conducted with a chaperone present.  Constitutional:      Appearance: Normal appearance. She is normal weight.  Eyes:     General: No scleral icterus.       Right eye: No discharge.        Left eye: No discharge.  Neck:     Musculoskeletal: Normal range of motion and neck supple. No muscular tenderness.  Cardiovascular:     Rate and Rhythm: Normal rate and regular rhythm.     Pulses: Normal pulses.  Pulmonary:     Effort: Pulmonary effort is normal. No respiratory distress.     Breath sounds: Normal breath sounds. No stridor.     Comments: BREAST: Right axillary and lumpectomy scar. No breast masses. There is a 3 cms soft tissue mass within left axilla c/w EIC vs lipoma. Mobile and not attach to deep tissue Abdominal:     General: There is no distension.     Palpations: Abdomen is soft. There is no mass.     Tenderness: There is no abdominal tenderness. There is no rebound.     Hernia: No hernia is present.  Musculoskeletal: Normal range of motion.        General: No swelling or tenderness.  Skin:    General: Skin is warm and dry.  Neurological:     Mental Status: She is alert.  Psychiatric:        Mood and Affect: Mood normal.        Behavior: Behavior normal.        Thought Content: Thought content normal.        Judgment: Judgment normal.        Assessment/Plan: 63 year old female with previous history of right breast cancer status post lumpectomy and radiation therapy as well as chemotherapy.  Now comes with a symptomatic left axillary lipoma versus epidermal inclusion cyst.  She did have an ultrasound 3 years ago confirming suspicious and she wishes excision.  I had a discussion with the patient regarding next steps.  Another imaging study with potential biopsy versus excisional biopsy of the mass.  I do think that given the fact that she has had a PET CT, ultrasound and that all points out to  a benign disease might be reasonable to proceed with excisional biopsy  Discussed with the patient in detail about the procedure.  Risks, benefits and possible complications including but not limited to: Bleeding, infection, the chance of this being malignant, she understands and wishes to proceed   Greater than 50% of the 40 minutes  visit was spent in counseling/coordination of care   Caroleen Hamman, MD FACS  General Surgeon

## 2019-08-04 NOTE — Patient Instructions (Addendum)
The patient is aware to call back for any questions or new concerns.  Schedule excision left axilla skin cyst

## 2019-08-04 NOTE — Progress Notes (Signed)
Patient's surgery to be scheduled for 08-26-19 at [facility] with Dr. Dahlia Byes.  The patient is aware to have COVID-19 testing done on 08-23-19 at the [facility] building drive thru ([address] [place]) between 8:00 am and 10:30 am. She is aware to isolate after, have no visitors, wash hands frequently, and avoid touching face.   The patient is aware she will be contacted by the [place] to complete a phone interview sometime in the near future.  Patient aware to be NPO after midnight and have a driver.   She is aware to check in at the [place] entrance where she will be screened for the coronavirus and then sent to Same Day Surgery.   Patient aware that she may have one visitor due to COVID-19 restrictions.   The patient verbalizes understanding of the above.   The patient is aware to call the office should she have further questions.

## 2019-08-06 ENCOUNTER — Telehealth: Payer: Self-pay | Admitting: Surgery

## 2019-08-06 NOTE — Telephone Encounter (Signed)
Patient is calling and has decided to cancel her surgery at this time, patient said she may want to r/s sometime in October if we could contact her then to see if she would like to schedule in October.

## 2019-08-06 NOTE — Telephone Encounter (Signed)
Surgery has been canceled for now and placed in the [place]. We will contact the patient in October to see if she would like to reschedule.

## 2019-08-19 ENCOUNTER — Other Ambulatory Visit: Payer: 59

## 2019-08-23 ENCOUNTER — Other Ambulatory Visit: Payer: Self-pay

## 2019-08-23 ENCOUNTER — Encounter: Payer: Self-pay | Admitting: *Deleted

## 2019-08-23 ENCOUNTER — Encounter: Payer: Self-pay | Admitting: Oncology

## 2019-08-23 ENCOUNTER — Inpatient Hospital Stay: Payer: 59 | Attending: Oncology

## 2019-08-23 ENCOUNTER — Inpatient Hospital Stay (HOSPITAL_BASED_OUTPATIENT_CLINIC_OR_DEPARTMENT_OTHER): Payer: 59 | Admitting: Oncology

## 2019-08-23 ENCOUNTER — Other Ambulatory Visit: Payer: 59

## 2019-08-23 VITALS — BP 105/64 | HR 71 | Temp 98.4°F | Resp 16 | Ht 62.0 in | Wt 180.0 lb

## 2019-08-23 DIAGNOSIS — Z170 Estrogen receptor positive status [ER+]: Secondary | ICD-10-CM | POA: Diagnosis not present

## 2019-08-23 DIAGNOSIS — Z853 Personal history of malignant neoplasm of breast: Secondary | ICD-10-CM | POA: Diagnosis not present

## 2019-08-23 DIAGNOSIS — Z85828 Personal history of other malignant neoplasm of skin: Secondary | ICD-10-CM | POA: Diagnosis not present

## 2019-08-23 DIAGNOSIS — Z79899 Other long term (current) drug therapy: Secondary | ICD-10-CM | POA: Insufficient documentation

## 2019-08-23 DIAGNOSIS — M533 Sacrococcygeal disorders, not elsewhere classified: Secondary | ICD-10-CM | POA: Diagnosis not present

## 2019-08-23 DIAGNOSIS — C8338 Diffuse large B-cell lymphoma, lymph nodes of multiple sites: Secondary | ICD-10-CM | POA: Insufficient documentation

## 2019-08-23 DIAGNOSIS — Z08 Encounter for follow-up examination after completed treatment for malignant neoplasm: Secondary | ICD-10-CM

## 2019-08-23 DIAGNOSIS — Z923 Personal history of irradiation: Secondary | ICD-10-CM | POA: Insufficient documentation

## 2019-08-23 DIAGNOSIS — C8331 Diffuse large B-cell lymphoma, lymph nodes of head, face, and neck: Secondary | ICD-10-CM

## 2019-08-23 DIAGNOSIS — R011 Cardiac murmur, unspecified: Secondary | ICD-10-CM | POA: Insufficient documentation

## 2019-08-23 DIAGNOSIS — C50211 Malignant neoplasm of upper-inner quadrant of right female breast: Secondary | ICD-10-CM | POA: Diagnosis not present

## 2019-08-23 DIAGNOSIS — Z9221 Personal history of antineoplastic chemotherapy: Secondary | ICD-10-CM | POA: Insufficient documentation

## 2019-08-23 DIAGNOSIS — R1310 Dysphagia, unspecified: Secondary | ICD-10-CM | POA: Insufficient documentation

## 2019-08-23 LAB — CBC WITH DIFFERENTIAL/PLATELET
Abs Immature Granulocytes: 0.01 10*3/uL (ref 0.00–0.07)
Basophils Absolute: 0 10*3/uL (ref 0.0–0.1)
Basophils Relative: 1 %
Eosinophils Absolute: 0.1 10*3/uL (ref 0.0–0.5)
Eosinophils Relative: 1 %
HCT: 43.2 % (ref 36.0–46.0)
Hemoglobin: 14.1 g/dL (ref 12.0–15.0)
Immature Granulocytes: 0 %
Lymphocytes Relative: 18 %
Lymphs Abs: 1.2 10*3/uL (ref 0.7–4.0)
MCH: 29.5 pg (ref 26.0–34.0)
MCHC: 32.6 g/dL (ref 30.0–36.0)
MCV: 90.4 fL (ref 80.0–100.0)
Monocytes Absolute: 0.5 10*3/uL (ref 0.1–1.0)
Monocytes Relative: 7 %
Neutro Abs: 4.8 10*3/uL (ref 1.7–7.7)
Neutrophils Relative %: 73 %
Platelets: 169 10*3/uL (ref 150–400)
RBC: 4.78 MIL/uL (ref 3.87–5.11)
RDW: 12.8 % (ref 11.5–15.5)
WBC: 6.5 10*3/uL (ref 4.0–10.5)
nRBC: 0 % (ref 0.0–0.2)

## 2019-08-23 LAB — COMPREHENSIVE METABOLIC PANEL
ALT: 18 U/L (ref 0–44)
AST: 18 U/L (ref 15–41)
Albumin: 4.2 g/dL (ref 3.5–5.0)
Alkaline Phosphatase: 99 U/L (ref 38–126)
Anion gap: 10 (ref 5–15)
BUN: 11 mg/dL (ref 8–23)
CO2: 29 mmol/L (ref 22–32)
Calcium: 9.7 mg/dL (ref 8.9–10.3)
Chloride: 103 mmol/L (ref 98–111)
Creatinine, Ser: 0.67 mg/dL (ref 0.44–1.00)
GFR calc Af Amer: >60 mL/min (ref >60)
GFR calc non Af Amer: >60 mL/min (ref >60)
Glucose, Bld: 104 mg/dL — ABNORMAL HIGH (ref 70–99)
Potassium: 4.3 mmol/L (ref 3.5–5.1)
Sodium: 142 mmol/L (ref 135–145)
Total Bilirubin: 0.7 mg/dL (ref 0.3–1.2)
Total Protein: 7.3 g/dL (ref 6.5–8.1)

## 2019-08-23 LAB — LACTATE DEHYDROGENASE: LDH: 116 U/L (ref 98–192)

## 2019-08-23 NOTE — Progress Notes (Signed)
Inflammation around the "sleeve" around the ligament in the right hand.  Using Voltaren gel with relief and has had cortisone injection

## 2019-08-23 NOTE — Progress Notes (Signed)
Hematology/Oncology Consult note [facility]  [phone] [phone]  Patient Care Team: Virginia Crews, MD as PCP - General (Family Medicine) Margarita Rana, MD as Referring Physician (Family Medicine) Bary Castilla Forest Gleason, MD (General Surgery) Sindy Guadeloupe, MD as Consulting Physician (Oncology) Noreene Filbert, MD as Referring Physician (Radiation Oncology)   Name of the patient: Victoria Benson  [id number]  29-Nov-1956   Date of visit: 08/23/19  Diagnosis- Stage II E DLBCL. ABCsubtype. Not double hit.S/p 3 cycles of RCHOP and IFRT   Chief complaint/ Reason for visit-routine follow-up of DLBCL  Heme/Onc history: patient is a 63 year old female who developed symptoms of upper respiratory infection and nasal congestion sometime in November 2018. Around the same time she also developed a right neck mass which was initially attributed to possible tonsillitis and she was given antibiotics for the same. However her right neck mass did not decrease in size and she eventually saw Dr. Pryor Ochoa from ENT. Patient was found to have significant right cervical adenopathy and underwent CT of the soft tissue neck which showed right level 2 and 3 adenopathy favoring metastatic carcinoma. Jugulodigastric node measuring up to 3.7 cm. Asymmetric fullness of the right tonsillar fossa possibly primary site.  Patient underwent core biopsy of the right cervical lymph node.Pathology showed diffuse large B-cell lymphoma of the ABC subtype. IHC was positive for BCL-2 and BCL 6 see Mick IHC was not tested due to insufficiency of the testing sample. Ki-67 was high 80-90%.  Patient reports feeling well prior to November 2018. Currently she denies any fevers, chills, unintentional weight loss or drenching night sweats. She reports some difficulty swallowing because of the right neck mass but denies any shortness of breath. She did receive 1 dose of 100 mg  prednisone yesterday and reports that her mass feels smaller and swallowing is easier.  PET/CT scan showed hypermetabolismin the tonsillar fossa bilaterally right greater than left as well as hypermetabolic lymphadenopathy in the right neck at the level 2 and level 3. No evidence of contralateral cervical adenopathy and no evidence of metastatic disease elsewhere  Bone marrow biopsy showed no evidence of lymphoma. IPI score 0- low risk. Does not meet criteria for CNS prophylaxis  Patient finished 3 cycles of RCHOP on 02/27/18.PET showed no active disease. She then completed IFRT and is in CR1  Interval history-patient continues to have tailbone pain which she had about 3 months ago.  Discomfort is mild at times when she sits at a 1 out of 10 but it can go up to as high as 5 out of 10.  She denies any trauma to this region.  ECOG PS- 0 Pain scale- 0 Opioid associated constipation- no  Review of systems- Review of Systems  Constitutional: Negative for chills, fever, malaise/fatigue and weight loss.  HENT: Negative for congestion, ear discharge and nosebleeds.   Eyes: Negative for blurred vision.  Respiratory: Negative for cough, hemoptysis, sputum production, shortness of breath and wheezing.   Cardiovascular: Negative for chest pain, palpitations, orthopnea and claudication.  Gastrointestinal: Negative for abdominal pain, blood in stool, constipation, diarrhea, heartburn, melena, nausea and vomiting.  Genitourinary: Negative for dysuria, flank pain, frequency, hematuria and urgency.  Musculoskeletal: Negative for back pain, joint pain and myalgias.  Skin: Negative for rash.  Neurological: Negative for dizziness, tingling, focal weakness, seizures, weakness and headaches.  Endo/Heme/Allergies: Does not bruise/bleed easily.  Psychiatric/Behavioral: Negative for depression and suicidal ideas. The patient does not have insomnia.  No Known Allergies   Past Medical History:   Diagnosis Date  . BRCA gene mutation negative 07/10/2016  . Breast cancer ([place]) 2009   T1c (1.2 cm) N0 ER 90%, PR 90%, HER-2/neu not amplified. Oncotype DX score, low risk (14) 9% chance of recurrence with antiestrogen therapy alone.  . Breast cancer ([place]) 06-17-16   INVASIVE MAMMARY CARCINOMA . T1, ER positive, PR positive, HER-2/neu not overexpressed  . Diffuse large B-cell lymphoma ([place]) 11/2017   Right cervical adenopathy  . Family history of adverse reaction to anesthesia    PTS MOM-HARD TO WAKE UP  . Heart murmur   . History of basal cell carcinoma 02/11/2011   right upper back  . History of dysplastic nevus 06/20/2011   left scapula  . Personal history of chemotherapy 2019   lymphoma  . Personal history of radiation therapy 2009   right breast cancer  . Personal history of radiation therapy 2017   right breast cancer  . Radiation 2009   BREAST CA  . Skin cancer      Past Surgical History:  Procedure Laterality Date  . APPENDECTOMY    . BREAST BIOPSY Right 06/17/2016   invasive. T1, ER positive, PR positive, HER-2/neu not overexpressed  . BREAST CYST ASPIRATION Left 2002  . BREAST EXCISIONAL BIOPSY Right 06/2016   lumpectomy with rad   . BREAST EXCISIONAL BIOPSY Right 2009   breast ca + mammo site radiation  . BREAST LUMPECTOMY WITH NEEDLE LOCALIZATION Right 07/18/2016   Procedure: BREAST LUMPECTOMY WITH NEEDLE LOCALIZATION AND RIGHT AXILLARY EXPLORATION;  Surgeon: Robert Bellow, MD;  Location: [facility] ORS;  Service: General;  Laterality: Right;  . BREAST SURGERY Right July 2009   Wide excision, sentinel left node biopsy MammoSite. ( 06/21/2008  . CHOLECYSTECTOMY  1984  . COLONOSCOPY WITH PROPOFOL N/A 10/13/2018   Procedure: COLONOSCOPY WITH PROPOFOL;  Surgeon: Lucilla Lame, MD;  Location: [facility] ENDOSCOPY;  Service: Endoscopy;  Laterality: N/A;  . DILATION AND CURETTAGE OF UTERUS  2004  . ENDOMETRIAL ABLATION  2005  . IR FLUORO GUIDE PORT INSERTION RIGHT  01/15/2018   . TUMOR REMOVAL  12/2013   from uterus done by [facility]    Social History   Socioeconomic History  . Marital status: Married    Spouse name: [name]  . Number of children: 2  . Years of education: 70  . Highest education level: Not on file  Occupational History  . Occupation: unemployed  Social Needs  . Financial resource strain: Not on file  . Food insecurity    Worry: Not on file    Inability: Not on file  . Transportation needs    Medical: Not on file    Non-medical: Not on file  Tobacco Use  . Smoking status: Never Smoker  . Smokeless tobacco: Never Used  Substance and Sexual Activity  . Alcohol use: No  . Drug use: No  . Sexual activity: Yes  Lifestyle  . Physical activity    Days per week: Not on file    Minutes per session: Not on file  . Stress: Not on file  Relationships  . Social Herbalist on phone: Not on file    Gets together: Not on file    Attends religious service: Not on file    Active member of club or organization: Not on file    Attends meetings of clubs or organizations: Not on file    Relationship status: Not on file  .  Intimate partner violence    Fear of current or ex partner: Not on file    Emotionally abused: Not on file    Physically abused: Not on file    Forced sexual activity: Not on file  Other Topics Concern  . Not on file  Social History Narrative  . Not on file    Family History  Problem Relation Age of Onset  . CAD Mother   . Osteoporosis Mother   . Lung cancer Father   . Diabetes Father   . Hypertension Brother   . Ovarian cancer Maternal Grandmother   . Heart attack Paternal Grandmother   . Diabetes Paternal Grandfather   . Fibromyalgia Brother   . Hypertension Brother   . Hypertension Brother   . Sleep apnea Brother   . Healthy Brother   . Sleep apnea Brother   . Ovarian cancer Other   . Breast cancer Neg Hx      Current Outpatient Medications:  .  anastrozole (ARIMIDEX) 1 MG tablet, TAKE ONE  TABLET EVERY DAY, Disp: 90 tablet, Rfl: 3 .  CALCIUM-MAGNESIUM-VITAMIN D ER PO, Take 1 tablet by mouth daily., Disp: , Rfl:  .  cetirizine (ZYRTEC) 10 MG tablet, Take 10 mg daily by mouth., Disp: , Rfl:  .  diclofenac sodium (VOLTAREN) 1 % GEL, Apply topically 4 (four) times daily. Patient is using it 3 times a day for arthritis pain, Disp: , Rfl:  .  fluticasone (FLONASE) 50 MCG/ACT nasal spray, 2 SPRAYS IN EACH NOSTRIL EVERY DAY, Disp: 16 g, Rfl: 6 .  Multiple Vitamin (MULTIVITAMIN) capsule, Take 1 capsule by mouth daily., Disp: , Rfl:  .  Omega-3 Krill Oil 500 MG CAPS, Take 1,000 mg by mouth daily. , Disp: , Rfl:   Physical exam:  Vitals:   08/23/19 1402  BP: 105/64  Pulse: 71  Resp: 16  Temp: 98.4 F (36.9 C)  TempSrc: Tympanic  Weight: 180 lb (81.6 kg)  Height: 5' 2" (1.575 m)   Physical Exam HENT:     Head: Normocephalic and atraumatic.  Eyes:     Pupils: Pupils are equal, round, and reactive to light.  Neck:     Musculoskeletal: Normal range of motion.  Cardiovascular:     Rate and Rhythm: Normal rate and regular rhythm.     Heart sounds: Normal heart sounds.  Pulmonary:     Effort: Pulmonary effort is normal.     Breath sounds: Normal breath sounds.  Abdominal:     General: Bowel sounds are normal.     Palpations: Abdomen is soft.     Comments: There is tenderness to palpation over the sacral area  Lymphadenopathy:     Comments: No palpable cervical, supraclavicular, axillary or inguinal adenopathy   Skin:    General: Skin is warm and dry.  Neurological:     Mental Status: She is alert and oriented to person, place, and time.      CMP Latest Ref Rng & Units 08/23/2019  Glucose 70 - 99 mg/dL 104(H)  BUN 8 - 23 mg/dL 11  Creatinine 0.44 - 1.00 mg/dL 0.67  Sodium 135 - 145 mmol/L 142  Potassium 3.5 - 5.1 mmol/L 4.3  Chloride 98 - 111 mmol/L 103  CO2 22 - 32 mmol/L 29  Calcium 8.9 - 10.3 mg/dL 9.7  Total Protein 6.5 - 8.1 g/dL 7.3  Total Bilirubin 0.3 - 1.2  mg/dL 0.7  Alkaline Phos 38 - 126 U/L 99  AST 15 - 41  U/L 18  ALT 0 - 44 U/L 18   CBC Latest Ref Rng & Units 08/23/2019  WBC 4.0 - 10.5 K/uL 6.5  Hemoglobin 12.0 - 15.0 g/dL 14.1  Hematocrit 36.0 - 46.0 % 43.2  Platelets 150 - 400 K/uL 169     Assessment and plan- Patient is a 63 y.o. female with Stage II E DLBCL abc subtypenot double hit. Low risk IPI score 0s/p 3cycles of RCHOP chemotherapyand IFRTin CR1. She is here for routine follow-up of DLBCL  It is unclear if her tailbone pain is related to lymphoma.  She otherwise does not have any areas of palpable adenopathy.  No B symptoms.  She has had a weight gain.  CBC, CMP and LDH are all unremarkable.  I will proceed with a CT pelvis with contrast at this time to assess the bone pain better.  I will arrange for video visit to discuss the results of the CT scan  From a lymphoma standpoint I will see her back in 6 months time with a CBC with differential, CMP and LDH   Visit Diagnosis 1. Tail bone pain   2. Diffuse large B-cell lymphoma of lymph nodes of neck (HCC)   3. Malignant neoplasm of upper-inner quadrant of right breast in female, estrogen receptor positive ([place])      Dr. Randa Evens, MD, MPH [facility] at [facility] [phone] 08/23/2019 2:35 PM

## 2019-08-26 ENCOUNTER — Ambulatory Visit: Admit: 2019-08-26 | Payer: 59 | Admitting: Surgery

## 2019-08-26 SURGERY — EXCISION, HIDRADENITIS, AXILLA
Anesthesia: General | Laterality: Left

## 2019-09-02 ENCOUNTER — Other Ambulatory Visit: Payer: Self-pay

## 2019-09-02 ENCOUNTER — Ambulatory Visit
Admission: RE | Admit: 2019-09-02 | Discharge: 2019-09-02 | Disposition: A | Discharge status: Home / Self Care | Payer: 59 | Source: Ambulatory Visit | Attending: Oncology | Admitting: Oncology

## 2019-09-02 DIAGNOSIS — Z170 Estrogen receptor positive status [ER+]: Secondary | ICD-10-CM | POA: Insufficient documentation

## 2019-09-02 DIAGNOSIS — C8331 Diffuse large B-cell lymphoma, lymph nodes of head, face, and neck: Secondary | ICD-10-CM | POA: Diagnosis present

## 2019-09-02 DIAGNOSIS — C50211 Malignant neoplasm of upper-inner quadrant of right female breast: Secondary | ICD-10-CM | POA: Diagnosis present

## 2019-09-02 DIAGNOSIS — M533 Sacrococcygeal disorders, not elsewhere classified: Secondary | ICD-10-CM | POA: Diagnosis present

## 2019-09-02 MED ORDER — IOHEXOL 300 MG/ML  SOLN
100.0000 mL | Freq: Once | INTRAMUSCULAR | Status: AC | PRN
  Administered 2019-09-02: 08:00:00 100 mL via INTRAVENOUS

## 2019-09-02 NOTE — Progress Notes (Signed)
Called patient no answer left message  

## 2019-09-03 ENCOUNTER — Encounter: Payer: Self-pay | Admitting: Oncology

## 2019-09-03 ENCOUNTER — Inpatient Hospital Stay: Payer: 59 | Attending: Oncology | Admitting: Oncology

## 2019-09-03 DIAGNOSIS — M533 Sacrococcygeal disorders, not elsewhere classified: Secondary | ICD-10-CM | POA: Diagnosis not present

## 2019-09-03 NOTE — Progress Notes (Signed)
Pt to do video visit to go over ct scan about her sacral area pain

## 2019-09-03 NOTE — Progress Notes (Signed)
I connected with Victoria Benson on 09/03/19 at 11:30 AM EDT by video enabled telemedicine visit and verified that I am speaking with the correct person using two identifiers.   I discussed the limitations, risks, security and privacy concerns of performing an evaluation and management service by telemedicine and the availability of in-person appointments. I also discussed with the patient that there may be a patient responsible charge related to this service. The patient expressed understanding and agreed to proceed.  Other persons participating in the visit and their role in the encounter:  Patients husband   Patient's location:  home Provider's location:  work  Risk analyst Complaint: Discuss CT scan results  History of present illness: patient is a 63 year old female who developed symptoms of upper respiratory infection and nasal congestion sometime in November 2018. Around the same time she also developed a right neck mass which was initially attributed to possible tonsillitis and she was given antibiotics for the same. However her right neck mass did not decrease in size and she eventually saw Dr. Pryor Ochoa from ENT. Patient was found to have significant right cervical adenopathy and underwent CT of the soft tissue neck which showed right level 2 and 3 adenopathy favoring metastatic carcinoma. Jugulodigastric node measuring up to 3.7 cm. Asymmetric fullness of the right tonsillar fossa possibly primary site.  Patient underwent core biopsy of the right cervical lymph node.Pathology showed diffuse large B-cell lymphoma of the ABC subtype. IHC was positive for BCL-2 and BCL 6 see Mick IHC was not tested due to insufficiency of the testing sample. Ki-67 was high 80-90%.  Patient reports feeling well prior to November 2018. Currently she denies any fevers, chills, unintentional weight loss or drenching night sweats. She reports some difficulty swallowing because of the right neck mass but denies any  shortness of breath. She did receive 1 dose of 100 mg prednisone yesterday and reports that her mass feels smaller and swallowing is easier.  PET/CT scan showed hypermetabolismin the tonsillar fossa bilaterally right greater than left as well as hypermetabolic lymphadenopathy in the right neck at the level 2 and level 3. No evidence of contralateral cervical adenopathy and no evidence of metastatic disease elsewhere  Bone marrow biopsy showed no evidence of lymphoma. IPI score 0- low risk. Does not meet criteria for CNS prophylaxis  Patient finished 3 cycles of RCHOP on 02/27/18.PET showed no active disease. She then completed IFRT and is in CR1  Interval history: Patient continues to have some tailbone pain.  Also reports stiffness in her hip joint   Review of Systems  Constitutional: Negative for chills, fever, malaise/fatigue and weight loss.  HENT: Negative for congestion, ear discharge and nosebleeds.   Eyes: Negative for blurred vision.  Respiratory: Negative for cough, hemoptysis, sputum production, shortness of breath and wheezing.   Cardiovascular: Negative for chest pain, palpitations, orthopnea and claudication.  Gastrointestinal: Negative for abdominal pain, blood in stool, constipation, diarrhea, heartburn, melena, nausea and vomiting.  Genitourinary: Negative for dysuria, flank pain, frequency, hematuria and urgency.  Musculoskeletal: Negative for back pain, joint pain and myalgias.  Skin: Negative for rash.  Neurological: Negative for dizziness, tingling, focal weakness, seizures, weakness and headaches.  Endo/Heme/Allergies: Does not bruise/bleed easily.  Psychiatric/Behavioral: Negative for depression and suicidal ideas. The patient does not have insomnia.     No Known Allergies  Past Medical History:  Diagnosis Date  . BRCA gene mutation negative 07/10/2016  . Breast cancer ([place]) 2009   T1c (1.2 cm) N0 ER 90%, PR  90%, HER-2/neu not amplified. Oncotype DX score,  low risk (14) 9% chance of recurrence with antiestrogen therapy alone.  . Breast cancer ([place]) 06-17-16   INVASIVE MAMMARY CARCINOMA . T1, ER positive, PR positive, HER-2/neu not overexpressed  . Diffuse large B-cell lymphoma ([place]) 11/2017   Right cervical adenopathy  . Family history of adverse reaction to anesthesia    PTS MOM-HARD TO WAKE UP  . Heart murmur   . History of basal cell carcinoma 02/11/2011   right upper back  . History of dysplastic nevus 06/20/2011   left scapula  . Personal history of chemotherapy 2019   lymphoma  . Personal history of radiation therapy 2009   right breast cancer  . Personal history of radiation therapy 2017   right breast cancer  . Radiation 2009   BREAST CA  . Skin cancer     Past Surgical History:  Procedure Laterality Date  . APPENDECTOMY    . BREAST BIOPSY Right 06/17/2016   invasive. T1, ER positive, PR positive, HER-2/neu not overexpressed  . BREAST CYST ASPIRATION Left 2002  . BREAST EXCISIONAL BIOPSY Right 06/2016   lumpectomy with rad   . BREAST EXCISIONAL BIOPSY Right 2009   breast ca + mammo site radiation  . BREAST LUMPECTOMY WITH NEEDLE LOCALIZATION Right 07/18/2016   Procedure: BREAST LUMPECTOMY WITH NEEDLE LOCALIZATION AND RIGHT AXILLARY EXPLORATION;  Surgeon: Robert Bellow, MD;  Location: [facility] ORS;  Service: General;  Laterality: Right;  . BREAST SURGERY Right July 2009   Wide excision, sentinel left node biopsy MammoSite. ( 06/21/2008  . CHOLECYSTECTOMY  1984  . COLONOSCOPY WITH PROPOFOL N/A 10/13/2018   Procedure: COLONOSCOPY WITH PROPOFOL;  Surgeon: Lucilla Lame, MD;  Location[facility] ENDOSCOPY;  Service: Endoscopy;  Laterality: N/A;  . DILATION AND CURETTAGE OF UTERUS  2004  . ENDOMETRIAL ABLATION  2005  . IR FLUORO GUIDE PORT INSERTION RIGHT  01/15/2018  . TUMOR REMOVAL  12/2013   from uterus done by [facility]    Social History   Socioeconomic History  . Marital status: Married    Spouse name: [name]  . Number  of children: 2  . Years of education: 41  . Highest education level: Not on file  Occupational History  . Occupation: unemployed  Social Needs  . Financial resource strain: Not on file  . Food insecurity    Worry: Not on file    Inability: Not on file  . Transportation needs    Medical: Not on file    Non-medical: Not on file  Tobacco Use  . Smoking status: Never Smoker  . Smokeless tobacco: Never Used  Substance and Sexual Activity  . Alcohol use: No  . Drug use: No  . Sexual activity: Yes  Lifestyle  . Physical activity    Days per week: Not on file    Minutes per session: Not on file  . Stress: Not on file  Relationships  . Social Herbalist on phone: Not on file    Gets together: Not on file    Attends religious service: Not on file    Active member of club or organization: Not on file    Attends meetings of clubs or organizations: Not on file    Relationship status: Not on file  . Intimate partner violence    Fear of current or ex partner: Not on file    Emotionally abused: Not on file    Physically abused: Not on file  Forced sexual activity: Not on file  Other Topics Concern  . Not on file  Social History Narrative  . Not on file    Family History  Problem Relation Age of Onset  . CAD Mother   . Osteoporosis Mother   . Lung cancer Father   . Diabetes Father   . Hypertension Brother   . Ovarian cancer Maternal Grandmother   . Heart attack Paternal Grandmother   . Diabetes Paternal Grandfather   . Fibromyalgia Brother   . Hypertension Brother   . Hypertension Brother   . Sleep apnea Brother   . Healthy Brother   . Sleep apnea Brother   . Ovarian cancer Other   . Breast cancer Neg Hx      Current Outpatient Medications:  .  anastrozole (ARIMIDEX) 1 MG tablet, TAKE ONE TABLET EVERY DAY, Disp: 90 tablet, Rfl: 3 .  CALCIUM-MAGNESIUM-VITAMIN D ER PO, Take 1 tablet by mouth daily., Disp: , Rfl:  .  cetirizine (ZYRTEC) 10 MG tablet, Take  10 mg daily by mouth., Disp: , Rfl:  .  diclofenac sodium (VOLTAREN) 1 % GEL, Apply topically 4 (four) times daily. Patient is using it 3 times a day for arthritis pain, Disp: , Rfl:  .  fluticasone (FLONASE) 50 MCG/ACT nasal spray, 2 SPRAYS IN EACH NOSTRIL EVERY DAY, Disp: 16 g, Rfl: 6 .  Multiple Vitamin (MULTIVITAMIN) capsule, Take 1 capsule by mouth daily., Disp: , Rfl:  .  Omega-3 Krill Oil 500 MG CAPS, Take 1,000 mg by mouth daily. , Disp: , Rfl:   Ct Pelvis W Contrast  Result Date: 09/02/2019 CLINICAL DATA:  Tailbone pain. EXAM: CT PELVIS WITH CONTRAST TECHNIQUE: Multidetector CT imaging of the pelvis was performed using the standard protocol following the bolus administration of intravenous contrast. CONTRAST:  158m OMNIPAQUE IOHEXOL 300 MG/ML  SOLN COMPARISON:  PET-CT 03/19/2018 FINDINGS: Urinary Tract:  No abnormality visualized. Bowel:  Unremarkable visualized pelvic bowel loops. Vascular/Lymphatic: No pathologically enlarged lymph nodes. No significant vascular abnormality seen. Reproductive: Within the posterior lower uterine segment there is a well-circumscribed mass measuring 3.7 cm which appears similar to previous PET-CT compatible with uterine fibroid. Other:  No free fluid or fluid collections identified. Musculoskeletal: No suspicious bone lesions identified. IMPRESSION: 1. No acute findings and no explanation for patient's tailbone pain. Electronically Signed   By: TKerby MoorsM.D.   On: 09/02/2019 09:04    No images are attached to the encounter.   CMP Latest Ref Rng & Units 08/23/2019  Glucose 70 - 99 mg/dL 104(H)  BUN 8 - 23 mg/dL 11  Creatinine 0.44 - 1.00 mg/dL 0.67  Sodium 135 - 145 mmol/L 142  Potassium 3.5 - 5.1 mmol/L 4.3  Chloride 98 - 111 mmol/L 103  CO2 22 - 32 mmol/L 29  Calcium 8.9 - 10.3 mg/dL 9.7  Total Protein 6.5 - 8.1 g/dL 7.3  Total Bilirubin 0.3 - 1.2 mg/dL 0.7  Alkaline Phos 38 - 126 U/L 99  AST 15 - 41 U/L 18  ALT 0 - 44 U/L 18   CBC Latest  Ref Rng & Units 08/23/2019  WBC 4.0 - 10.5 K/uL 6.5  Hemoglobin 12.0 - 15.0 g/dL 14.1  Hematocrit 36.0 - 46.0 % 43.2  Platelets 150 - 400 K/uL 169     Observation/objective: Appears in no acute distress of a video visit today.  Breathing is nonlabored  Assessment and plan: Patient is a 63year old female with history of stage I diffuse large B-cell lymphoma  status post 3 cycles of R-CHOP chemotherapy and IFRT currently in CR 1.  She is here to discuss the results of CT pelvis  I have reviewed the results of CT pelvis with the patient in detail which was done for evaluation of her tailbone pain.  There is no evidence of lymphoma or bony abnormality in that region.  Patient has been on anastrozole for stage I breast cancer since 2017 and sometimes anastrozole can lead to arthralgias.  I recommended that she can try to hold off on anastrozole for about 2 to 3 weeks and see if her symptoms of hip stiffness improved.  If patient continues to have hip stiffness and tailbone pain could get in touch with her orthopedic doctor who she has seen in the past for trigger finger  Follow-up instructions: I will see her back in February 2021 as scheduled  I discussed the assessment and treatment plan with the patient. The patient was provided an opportunity to ask questions and all were answered. The patient agreed with the plan and demonstrated an understanding of the instructions.   The patient was advised to call back or seek an in-person evaluation if the symptoms worsen or if the condition fails to improve as anticipated.    Visit Diagnosis: 1. Tail bone pain     Dr. Randa Evens, MD, MPH [facility] [phone] 09/03/2019 12:22 PM

## 2019-09-27 NOTE — Progress Notes (Signed)
Patient: Victoria Benson, Female    DOB: 1956-04-10, 63 y.o.   MRN: [id number] Visit Date: 09/28/2019  Today's Provider: Lavon Paganini, MD   Chief Complaint  Patient presents with  . Annual Exam   Subjective:     Annual physical exam AYLLA HUFFINE is a 63 y.o. female who presents today for health maintenance and complete physical. She feels well. She reports exercising none. She reports she is sleeping fairly well.  ----------------------------------------------------------------- Mammogram:07/21/19 Normal  Joint stiffness - eases up some but lasts all day  was evaluated by Oncology with CT scan to rule out lymphoma Still having tailbone pain Trigger finger in R hand Dr Nicola Police at [facility] Upcoming appt in next few weeks.  Review of Systems  Constitutional: Positive for activity change.  HENT: Positive for hearing loss, mouth sores and tinnitus.   Eyes: Negative.   Respiratory: Negative.   Cardiovascular: Negative.   Gastrointestinal: Negative.   Endocrine: Negative.   Genitourinary: Negative.   Musculoskeletal: Positive for arthralgias.  Skin: Negative.   Allergic/Immunologic: Negative.   Neurological: Negative.   Hematological: Negative.   Psychiatric/Behavioral: Negative.     Social History      She  reports that she has never smoked. She has never used smokeless tobacco. She reports that she does not drink alcohol or use drugs.       Social History   Socioeconomic History  . Marital status: Married    Spouse name: [name]  . Number of children: 2  . Years of education: 38  . Highest education level: Not on file  Occupational History  . Occupation: unemployed  Social Needs  . Financial resource strain: Not on file  . Food insecurity    Worry: Not on file    Inability: Not on file  . Transportation needs    Medical: Not on file    Non-medical: Not on file  Tobacco Use  . Smoking status: Never Smoker  . Smokeless tobacco: Never Used  Substance  and Sexual Activity  . Alcohol use: No  . Drug use: No  . Sexual activity: Yes  Lifestyle  . Physical activity    Days per week: Not on file    Minutes per session: Not on file  . Stress: Not on file  Relationships  . Social Herbalist on phone: Not on file    Gets together: Not on file    Attends religious service: Not on file    Active member of club or organization: Not on file    Attends meetings of clubs or organizations: Not on file    Relationship status: Not on file  Other Topics Concern  . Not on file  Social History Narrative  . Not on file    Past Medical History:  Diagnosis Date  . BRCA gene mutation negative 07/10/2016  . Breast cancer ([place]) 2009   T1c (1.2 cm) N0 ER 90%, PR 90%, HER-2/neu not amplified. Oncotype DX score, low risk (14) 9% chance of recurrence with antiestrogen therapy alone.  . Breast cancer ([place]) 06-17-16   INVASIVE MAMMARY CARCINOMA . T1, ER positive, PR positive, HER-2/neu not overexpressed  . Diffuse large B-cell lymphoma ([place]) 11/2017   Right cervical adenopathy  . Family history of adverse reaction to anesthesia    PTS MOM-HARD TO WAKE UP  . Heart murmur   . History of basal cell carcinoma 02/11/2011   right upper back  . History of dysplastic  nevus 06/20/2011   left scapula  . Personal history of chemotherapy 2019   lymphoma  . Personal history of radiation therapy 2009   right breast cancer  . Personal history of radiation therapy 2017   right breast cancer  . Radiation 2009   BREAST CA  . Skin cancer      Patient Active Problem List   Diagnosis Date Noted  . Class 1 obesity without serious comorbidity with body mass index (BMI) of 33.0 to 33.9 in adult 09/28/2019  . Joint stiffness 09/28/2019  . Polyarthralgia 09/28/2019  . Dizziness 09/25/2018  . DLBCL (diffuse large B cell lymphoma) ([place]) 01/14/2018  . Chronic venous insufficiency 04/21/2017  . Leg pain 04/21/2017  . Malignant neoplasm of upper-inner  quadrant of right breast in female, estrogen receptor positive ([place]) 07/11/2016  . Knee pain, left 05/14/2016  . Subclinical hypothyroidism 03/20/2016  . Allergic rhinitis 01/04/2016  . Varicose veins of both lower extremities with pain 01/04/2016  . Hyperglyceridemia, pure 06/22/2009  . Avitaminosis D 05/18/2009  . Fam hx-ischem heart disease 04/05/2009  . HLD (hyperlipidemia) 04/04/2009  . Adaptive colitis 04/04/2009    Past Surgical History:  Procedure Laterality Date  . APPENDECTOMY    . BREAST BIOPSY Right 06/17/2016   invasive. T1, ER positive, PR positive, HER-2/neu not overexpressed  . BREAST CYST ASPIRATION Left 2002  . BREAST EXCISIONAL BIOPSY Right 06/2016   lumpectomy with rad   . BREAST EXCISIONAL BIOPSY Right 2009   breast ca + mammo site radiation  . BREAST LUMPECTOMY WITH NEEDLE LOCALIZATION Right 07/18/2016   Procedure: BREAST LUMPECTOMY WITH NEEDLE LOCALIZATION AND RIGHT AXILLARY EXPLORATION;  Surgeon: Robert Bellow, MD;  Location: [facility] ORS;  Service: General;  Laterality: Right;  . BREAST SURGERY Right July 2009   Wide excision, sentinel left node biopsy MammoSite. ( 06/21/2008  . CHOLECYSTECTOMY  1984  . COLONOSCOPY WITH PROPOFOL N/A 10/13/2018   Procedure: COLONOSCOPY WITH PROPOFOL;  Surgeon: Lucilla Lame, MD;  Location: [facility] ENDOSCOPY;  Service: Endoscopy;  Laterality: N/A;  . DILATION AND CURETTAGE OF UTERUS  2004  . ENDOMETRIAL ABLATION  2005  . IR FLUORO GUIDE PORT INSERTION RIGHT  01/15/2018  . TUMOR REMOVAL  12/2013   from uterus done by [facility]    Family History        Family Status  Relation Name Status  . Mother  Alive  . Father  Deceased       CA  . Brother 1 Alive  . MGM  Deceased at age 32       CA  . PGM  Deceased  . PGF  Deceased  . Brother 2 Alive  . Brother 3 Alive  . Brother 4 Alive  . MGF  Deceased at age 28       old age  . Other Maternal Janett Labella (Not Specified)  . Neg Hx  (Not Specified)        Her family  history includes CAD in her mother; Diabetes in her father and paternal grandfather; Fibromyalgia in her brother; Healthy in her brother; Heart attack in her paternal grandmother; Hypertension in her brother, brother, and brother; Lung cancer in her father; Osteoporosis in her mother; Ovarian cancer in her maternal grandmother and another family member; Sleep apnea in her brother and brother. There is no history of Breast cancer.      No Known Allergies   Current Outpatient Medications:  .  anastrozole (ARIMIDEX) 1 MG tablet, TAKE ONE TABLET  EVERY DAY, Disp: 90 tablet, Rfl: 3 .  CALCIUM-MAGNESIUM-VITAMIN D ER PO, Take 1 tablet by mouth daily., Disp: , Rfl:  .  cetirizine (ZYRTEC) 10 MG tablet, Take 10 mg daily by mouth., Disp: , Rfl:  .  diclofenac sodium (VOLTAREN) 1 % GEL, Apply topically 4 (four) times daily. Patient is using it 3 times a day for arthritis pain, Disp: , Rfl:  .  fluticasone (FLONASE) 50 MCG/ACT nasal spray, 2 SPRAYS IN EACH NOSTRIL EVERY DAY, Disp: 16 g, Rfl: 6 .  Multiple Vitamin (MULTIVITAMIN) capsule, Take 1 capsule by mouth daily., Disp: , Rfl:  .  Omega-3 Krill Oil 500 MG CAPS, Take 1,000 mg by mouth daily. , Disp: , Rfl:    Patient Care Team: Virginia Crews, MD as PCP - General (Family Medicine) Margarita Rana, MD as Referring Physician (Family Medicine) Robert Bellow, MD (General Surgery) Sindy Guadeloupe, MD as Consulting Physician (Oncology) Noreene Filbert, MD as Referring Physician (Radiation Oncology)    Objective:    Vitals: BP 113/79 (BP Location: Left Arm, Patient Position: Sitting, Cuff Size: Large)   Pulse 77   Temp 97.8 F (36.6 C) (Other (Comment)) Comment (Src): forehead  Resp 16   Ht 5' 2"  (1.575 m)   Wt 181 lb (82.1 kg)   BMI 33.11 kg/m    Vitals:   09/28/19 0942  BP: 113/79  Pulse: 77  Resp: 16  Temp: 97.8 F (36.6 C)  TempSrc: Other (Comment)  Weight: 181 lb (82.1 kg)  Height: 5' 2"  (1.575 m)     Physical Exam  Constitutional:      Appearance: Normal appearance.  HENT:     Head: Normocephalic and atraumatic.     Right Ear: Tympanic membrane, ear canal and external ear normal.     Left Ear: Tympanic membrane, ear canal and external ear normal.     Nose: Nose normal.     Mouth/Throat:     Mouth: Mucous membranes are moist.     Pharynx: Oropharynx is clear.  Eyes:     Extraocular Movements: Extraocular movements intact.     Conjunctiva/sclera: Conjunctivae normal.     Pupils: Pupils are equal, round, and reactive to light.  Neck:     Musculoskeletal: Normal range of motion and neck supple.  Cardiovascular:     Rate and Rhythm: Normal rate and regular rhythm.     Pulses: Normal pulses.     Heart sounds: Normal heart sounds.  Pulmonary:     Effort: Pulmonary effort is normal.     Breath sounds: Normal breath sounds.  Abdominal:     General: Bowel sounds are normal.  Musculoskeletal: Normal range of motion.  Skin:    General: Skin is warm and dry.  Neurological:     Mental Status: She is alert and oriented to person, place, and time.  Psychiatric:        Mood and Affect: Mood normal.        Behavior: Behavior normal.        Thought Content: Thought content normal.        Judgment: Judgment normal.      Depression Screen PHQ 2/9 Scores 09/28/2019 09/25/2018 07/30/2018 03/31/2018  PHQ - 2 Score 0 0 0 0  PHQ- 9 Score - 0 - -       Assessment & Plan:     Routine Health Maintenance and Physical Exam  Exercise Activities and Dietary recommendations Goals   None  Immunization History  Administered Date(s) Administered  . Influenza Split 09/10/2011, 12/11/2012  . Influenza,inj,Quad PF,6+ Mos 09/25/2018, 09/28/2019  . Influenza,inj,quad, With Preservative 12/19/2017  . Influenza-Unspecified 12/04/2015  . Td 07/10/1999, 09/28/2019  . Tdap 04/12/2009  . Zoster Recombinat (Shingrix) 09/28/2019    Health Maintenance  Topic Date Due  . Samul Dada  04/13/2019  . INFLUENZA  VACCINE  07/31/2019  . PAP SMEAR-Modifier  03/21/2020  . MAMMOGRAM  07/20/2021  . COLONOSCOPY  10/13/2028  . Hepatitis C Screening  Completed  . HIV Screening  Completed     Discussed health benefits of physical activity, and encouraged her to engage in regular exercise appropriate for her age and condition.    --------------------------------------------------------------------  Problem List Items Addressed This Visit      Endocrine   Subclinical hypothyroidism    Followed by ENT Will be having repeat Thyroid US Recheck TSH and Free T4      Relevant Orders   TSH   T4, free     Other   HLD (hyperlipidemia)    Not on statin Recheck lipid panel and CMP      Relevant Orders   Lipid panel   Avitaminosis D    Recheck Vit D level      Relevant Orders   VITAMIN D 25 Hydroxy (Vit-D Deficiency, Fractures)   Malignant neoplasm of upper-inner quadrant of right breast in female, estrogen receptor positive ([place])    Followed by Onc and Surgery Following annual mammograms - UTD On arimidex for total of 5 years      Class 1 obesity without serious comorbidity with body mass index (BMI) of 33.0 to 33.9 in adult    Discussed importance of healthy weight management Discussed diet and exercise       Joint stiffness    Reports all day joint stiffness and polyarthralgia Will discuss with Ortho at upcoming appt Check ESR, CRP, RF, ANA May consider Rheum referral in the past      Relevant Orders   ANA   Rheumatoid Factor   Sed Rate (ESR)   C-reactive protein   Polyarthralgia    Reports all day joint stiffness and polyarthralgia Will discuss with Ortho at upcoming appt Check ESR, CRP, RF, ANA May consider Rheum referral in the past      Relevant Orders   ANA   Rheumatoid Factor   Sed Rate (ESR)   C-reactive protein    Other Visit Diagnoses    Encounter for annual physical exam    -  Primary   Need for influenza vaccination       Relevant Orders   Flu Vaccine  QUAD 36+ mos IM (Completed)   Family history of diabetes mellitus       Relevant Orders   Hemoglobin A1c   Need for vaccine for Td (tetanus-diphtheria)       Relevant Orders   Td : Tetanus/diphtheria >7yo Preservative  free (Completed)   Need for shingles vaccine       Relevant Orders   Varicella-zoster vaccine IM (Completed)   Hyperglycemia       Relevant Orders   Hemoglobin A1c       Return in about 1 year (around 09/27/2020) for CPE.   The entirety of the information documented in the History of Present Illness, Review of Systems and Physical Exam were personally obtained by me. Portions of this information were initially documented by Lyndel Pleasure, CMA and reviewed by me for thoroughness and accuracy.  Traxton Kolenda, Dionne Bucy, MD MPH [facility]

## 2019-09-28 ENCOUNTER — Encounter: Payer: Self-pay | Admitting: Family Medicine

## 2019-09-28 ENCOUNTER — Ambulatory Visit (INDEPENDENT_AMBULATORY_CARE_PROVIDER_SITE_OTHER): Payer: 59 | Admitting: Family Medicine

## 2019-09-28 ENCOUNTER — Other Ambulatory Visit: Payer: Self-pay

## 2019-09-28 VITALS — BP 113/79 | HR 77 | Temp 97.8°F | Resp 16 | Ht 62.0 in | Wt 181.0 lb

## 2019-09-28 DIAGNOSIS — E039 Hypothyroidism, unspecified: Secondary | ICD-10-CM | POA: Diagnosis not present

## 2019-09-28 DIAGNOSIS — M2560 Stiffness of unspecified joint, not elsewhere classified: Secondary | ICD-10-CM | POA: Diagnosis not present

## 2019-09-28 DIAGNOSIS — Z23 Encounter for immunization: Secondary | ICD-10-CM

## 2019-09-28 DIAGNOSIS — M2550 Pain in unspecified joint: Secondary | ICD-10-CM | POA: Diagnosis not present

## 2019-09-28 DIAGNOSIS — E785 Hyperlipidemia, unspecified: Secondary | ICD-10-CM | POA: Diagnosis not present

## 2019-09-28 DIAGNOSIS — E038 Other specified hypothyroidism: Secondary | ICD-10-CM

## 2019-09-28 DIAGNOSIS — Z0000 Encounter for general adult medical examination without abnormal findings: Secondary | ICD-10-CM

## 2019-09-28 DIAGNOSIS — Z170 Estrogen receptor positive status [ER+]: Secondary | ICD-10-CM

## 2019-09-28 DIAGNOSIS — C50211 Malignant neoplasm of upper-inner quadrant of right female breast: Secondary | ICD-10-CM

## 2019-09-28 DIAGNOSIS — Z6833 Body mass index (BMI) 33.0-33.9, adult: Secondary | ICD-10-CM

## 2019-09-28 DIAGNOSIS — R739 Hyperglycemia, unspecified: Secondary | ICD-10-CM

## 2019-09-28 DIAGNOSIS — E559 Vitamin D deficiency, unspecified: Secondary | ICD-10-CM

## 2019-09-28 DIAGNOSIS — Z833 Family history of diabetes mellitus: Secondary | ICD-10-CM

## 2019-09-28 DIAGNOSIS — E669 Obesity, unspecified: Secondary | ICD-10-CM | POA: Insufficient documentation

## 2019-09-28 NOTE — Assessment & Plan Note (Signed)
Discussed importance of healthy weight management Discussed diet and exercise  

## 2019-09-28 NOTE — Assessment & Plan Note (Signed)
Followed by Onc and Surgery Following annual mammograms - UTD On arimidex for total of 5 years

## 2019-09-28 NOTE — Assessment & Plan Note (Signed)
Not on statin Recheck lipid panel and CMP

## 2019-09-28 NOTE — Assessment & Plan Note (Signed)
Reports all day joint stiffness and polyarthralgia Will discuss with Ortho at upcoming appt Check ESR, CRP, RF, ANA May consider Rheum referral in the past

## 2019-09-28 NOTE — Assessment & Plan Note (Signed)
Followed by ENT Will be having repeat Thyroid US Recheck TSH and Free T4

## 2019-09-28 NOTE — Patient Instructions (Signed)

## 2019-09-28 NOTE — Assessment & Plan Note (Signed)
Recheck Vit D level 

## 2019-09-29 ENCOUNTER — Telehealth: Payer: Self-pay

## 2019-09-29 NOTE — Telephone Encounter (Signed)
Patient was advised.  

## 2019-09-29 NOTE — Telephone Encounter (Signed)
-----   Message from Virginia Crews, MD sent at 09/29/2019 10:00 AM EDT ----- Normal blood sugar, thyroid function.  Vitamin D is slightly low.  Would recommend 1000 to 2000 units of vitamin D3 daily.  Cholesterol is slightly elevated, I would recommend diet low in saturated fat and regular exercise - 30 min at least 5 times per week.  Negative rheumatoid factor.  ANA is pending.  Normal inflammatory markers.

## 2019-09-30 LAB — ANA: ANA Titer 1: NEGATIVE

## 2019-09-30 LAB — VITAMIN D 25 HYDROXY (VIT D DEFICIENCY, FRACTURES): Vit D, 25-Hydroxy: 29.4 ng/mL — ABNORMAL LOW (ref 30.0–100.0)

## 2019-09-30 LAB — LIPID PANEL
Chol/HDL Ratio: 3.9 ratio (ref 0.0–4.4)
Cholesterol, Total: 197 mg/dL (ref 100–199)
HDL: 50 mg/dL (ref >39)
LDL Chol Calc (NIH): 108 mg/dL — ABNORMAL HIGH (ref 0–99)
Triglycerides: 225 mg/dL — ABNORMAL HIGH (ref 0–149)
VLDL Cholesterol Cal: 39 mg/dL (ref 5–40)

## 2019-09-30 LAB — SEDIMENTATION RATE: Sed Rate: 22 mm/hr (ref 0–40)

## 2019-09-30 LAB — C-REACTIVE PROTEIN: CRP: 2 mg/L (ref 0–10)

## 2019-09-30 LAB — HEMOGLOBIN A1C
Est. average glucose Bld gHb Est-mCnc: 100 mg/dL
Hgb A1c MFr Bld: 5.1 % (ref 4.8–5.6)

## 2019-09-30 LAB — TSH: TSH: 3.97 u[IU]/mL (ref 0.450–4.500)

## 2019-09-30 LAB — RHEUMATOID FACTOR: Rheumatoid fact SerPl-aCnc: <10 IU/mL (ref 0.0–13.9)

## 2019-09-30 LAB — T4, FREE: Free T4: 1.06 ng/dL (ref 0.82–1.77)

## 2019-10-08 ENCOUNTER — Telehealth: Payer: Self-pay

## 2019-10-08 NOTE — Telephone Encounter (Signed)
Call to patient to see if she wants to reschedule her surgery with Dr Dahlia Byes. Patient states that at this time she has some health issues that she is working through. She will call us once she is ready to see about rescheduling.

## 2019-11-29 ENCOUNTER — Other Ambulatory Visit: Payer: Self-pay | Admitting: Otolaryngology

## 2019-11-29 DIAGNOSIS — Z8572 Personal history of non-Hodgkin lymphomas: Secondary | ICD-10-CM

## 2019-11-30 ENCOUNTER — Ambulatory Visit: Payer: 59 | Admitting: Family Medicine

## 2019-11-30 ENCOUNTER — Other Ambulatory Visit: Payer: Self-pay

## 2019-11-30 ENCOUNTER — Ambulatory Visit (INDEPENDENT_AMBULATORY_CARE_PROVIDER_SITE_OTHER): Payer: 59 | Admitting: Family Medicine

## 2019-11-30 ENCOUNTER — Encounter: Payer: Self-pay | Admitting: Family Medicine

## 2019-11-30 DIAGNOSIS — Z23 Encounter for immunization: Secondary | ICD-10-CM

## 2019-11-30 NOTE — Progress Notes (Signed)
Patient here for Zoster vaccination only.  I did not examine the patient.  I did review his medical history, medications, and allergies and vaccine consent form.  CMA gave vaccination. Patient tolerated well.  Virginia Crews, MD, MPH [facility] 11/30/2019 4:35 PM

## 2019-12-08 ENCOUNTER — Other Ambulatory Visit: Payer: Self-pay | Admitting: Family Medicine

## 2019-12-10 ENCOUNTER — Other Ambulatory Visit: Payer: Self-pay

## 2019-12-10 ENCOUNTER — Ambulatory Visit
Admission: RE | Admit: 2019-12-10 | Discharge: 2019-12-10 | Disposition: A | Discharge status: Home / Self Care | Payer: 59 | Source: Ambulatory Visit | Attending: Otolaryngology | Admitting: Otolaryngology

## 2019-12-10 DIAGNOSIS — Z8572 Personal history of non-Hodgkin lymphomas: Secondary | ICD-10-CM | POA: Diagnosis not present

## 2019-12-10 LAB — POCT I-STAT CREATININE: Creatinine, Ser: 0.8 mg/dL (ref 0.44–1.00)

## 2019-12-10 MED ORDER — IOHEXOL 300 MG/ML  SOLN
75.0000 mL | Freq: Once | INTRAMUSCULAR | Status: AC | PRN
  Administered 2019-12-10: 75 mL via INTRAVENOUS

## 2019-12-14 ENCOUNTER — Encounter: Payer: Self-pay | Admitting: Radiation Oncology

## 2019-12-14 ENCOUNTER — Other Ambulatory Visit: Payer: Self-pay

## 2019-12-14 ENCOUNTER — Ambulatory Visit
Admission: RE | Admit: 2019-12-14 | Discharge: 2019-12-14 | Disposition: A | Discharge status: Home / Self Care | Payer: 59 | Source: Ambulatory Visit | Attending: Radiation Oncology | Admitting: Radiation Oncology

## 2019-12-14 VITALS — BP 119/66 | HR 80 | Temp 97.5°F | Resp 18 | Wt 186.6 lb

## 2019-12-14 DIAGNOSIS — Z923 Personal history of irradiation: Secondary | ICD-10-CM | POA: Diagnosis not present

## 2019-12-14 DIAGNOSIS — Z8572 Personal history of non-Hodgkin lymphomas: Secondary | ICD-10-CM | POA: Insufficient documentation

## 2019-12-14 DIAGNOSIS — Z9221 Personal history of antineoplastic chemotherapy: Secondary | ICD-10-CM | POA: Diagnosis not present

## 2019-12-14 DIAGNOSIS — C8331 Diffuse large B-cell lymphoma, lymph nodes of head, face, and neck: Secondary | ICD-10-CM

## 2019-12-14 NOTE — Progress Notes (Signed)
Radiation Oncology Follow up Note  Name: Victoria Benson   Date:   12/14/2019 MRN:  [id number] DOB: 08-Nov-1956    This 63 y.o. female presents to the clinic today for 1 and half year follow-up status post ration therapy to her right neck for involved field treatment of diffuse B-cell lymphoma status post R-CHOP chemotherapy.  REFERRING PROVIDER: Virginia Crews, MD  HPI: Patient is a 63 year old female now about 1-1/2 years having completed.  R-CHOP chemotherapy and involved field radiation therapy for diffuse B-cell lymphoma.  She is seen today in routine follow-up is doing well.  She specifically denies any dysphagia head and neck pain fever chills night sweats or weight loss.  She had a recent CT scan of her head and neck showing a right thyroid lobe less conspicuous than previous CT scan and does not be consistent criteria for ultrasound follow-up.  No cervical lymphadenopathy was noted.  COMPLICATIONS OF TREATMENT: none  FOLLOW UP COMPLIANCE: keeps appointments   PHYSICAL EXAM:  BP 119/66   Pulse 80   Temp (!) 97.5 F (36.4 C)   Resp 18   Wt 186 lb 9.6 oz (84.6 kg)   BMI 34.13 kg/m  No evidence of cervical or supraclavicular adenopathy.  Well-developed well-nourished patient in NAD. HEENT reveals PERLA, EOMI, discs not visualized.  Oral cavity is clear. No oral mucosal lesions are identified. Neck is clear without evidence of cervical or supraclavicular adenopathy. Lungs are clear to A&P. Cardiac examination is essentially unremarkable with regular rate and rhythm without murmur rub or thrill. Abdomen is benign with no organomegaly or masses noted. Motor sensory and DTR levels are equal and symmetric in the upper and lower extremities. Cranial nerves II through XII are grossly intact. Proprioception is intact. No peripheral adenopathy or edema is identified. No motor or sensory levels are noted. Crude visual fields are within normal range.  RADIOLOGY RESULTS: CT scan reviewed  compatible with above-stated findings  PLAN: Present time patient is doing well with no evidence of disease.  At this time I am going to let medical oncology continue to follow-up the patient.  I be happy to reevaluate her at any time should further consultation be indicated.  I would like to take this opportunity to thank you for allowing me to participate in the care of your patient.Noreene Filbert, MD

## 2019-12-15 ENCOUNTER — Ambulatory Visit: Payer: 59 | Admitting: Radiation Oncology

## 2020-01-25 IMAGING — CT NM PET TUM IMG RESTAG (PS) SKULL BASE T - THIGH
1 of 9 series · 1 of 25 positions shown · non-contrast
Comparison: PET-CT 01/12/2018 and neck CT 12/15/2017.

CLINICAL DATA: Subsequent treatment strategy for diffuse large
B-cell lymphoma.

EXAM:
NUCLEAR MEDICINE PET SKULL BASE TO THIGH
TECHNIQUE: 9.96 mCi F-18 FDG was injected intravenously. Full-ring PET imaging
was performed from the skull base to thigh after the radiotracer. CT
data was obtained and used for attenuation correction and anatomic
localization.
Fasting blood glucose: 92 mg/dl
Mediastinal blood pool activity: SUV max

[Series 3: ct wb 5.0 b30f · axial · 5.0mm · 0.98mm/px · 1 of 290 slices shown]
[im 290/290  brain]
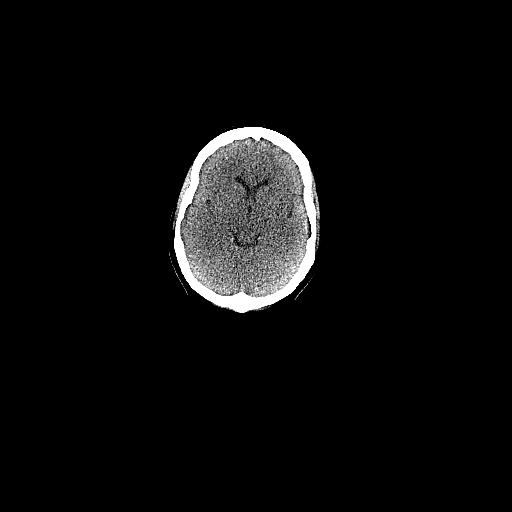

[1 of 25 positions shown; findings below may reference images not displayed]

FINDINGS: NECK:

There are no residual enlarged or hypermetabolic cervical lymph
nodes. There are no focal lesions of the pharyngeal mucosal space.
Activity within Waldeyer's ring is now symmetric and within
physiologic limits (SUV max 5.0).

Incidental CT findings: Stable 2 cm low-density right thyroid nodule
on image 53, without hypermetabolic activity.

CHEST:

There are no hypermetabolic mediastinal, hilar or axillary lymph
nodes. There is no hypermetabolic pulmonary activity.

Incidental CT findings: Right IJ Port-A-Cath extends to the inferior
right atrium. There are new diffuse ground-glass opacities
throughout both lungs. No focal airspace disease or suspicious
pulmonary nodule.

ABDOMEN/PELVIS:

There is no hypermetabolic activity within the liver, adrenal
glands, spleen or pancreas. There is no hypermetabolic nodal
activity. Hepatic activity: SUV max 4.0.

Incidental CT findings: Posterior uterine fibroid and mild aortic
atherosclerosis noted.

SKELETON:

There is no hypermetabolic activity to suggest osseous metastatic
disease.

Incidental CT findings: none
IMPRESSION: 1. Complete response to therapy with no hypermetabolic or enlarged
cervical lymph nodes. [HOSPITAL] 1.
2. No new areas of hypermetabolism.
3. New ground-glass opacities in the lungs, likely atelectasis,
edema or drug reaction. No associated hypermetabolic activity.
4. Tip of the right IJ Port-A-Cath extends to the inferior aspect of
the right atrium. This may be related to imaging in the supine
position. Suggest radiographic correlation for catheter position and
the pulmonary findings.

## 2020-01-28 ENCOUNTER — Other Ambulatory Visit: Payer: Self-pay | Admitting: Family Medicine

## 2020-01-31 ENCOUNTER — Encounter: Payer: Self-pay | Admitting: Family Medicine

## 2020-01-31 ENCOUNTER — Other Ambulatory Visit: Payer: Self-pay

## 2020-01-31 ENCOUNTER — Ambulatory Visit (INDEPENDENT_AMBULATORY_CARE_PROVIDER_SITE_OTHER): Payer: 59 | Admitting: Family Medicine

## 2020-01-31 VITALS — BP 113/78 | HR 86 | Temp 96.9°F | Resp 16 | Ht 62.0 in | Wt 186.0 lb

## 2020-01-31 DIAGNOSIS — N3090 Cystitis, unspecified without hematuria: Secondary | ICD-10-CM | POA: Diagnosis not present

## 2020-01-31 DIAGNOSIS — N8110 Cystocele, unspecified: Secondary | ICD-10-CM | POA: Diagnosis not present

## 2020-01-31 LAB — POCT URINALYSIS DIPSTICK
Bilirubin, UA: NEGATIVE
Glucose, UA: NEGATIVE
Ketones, UA: NEGATIVE
Nitrite, UA: NEGATIVE
Protein, UA: NEGATIVE
Spec Grav, UA: 1.01 (ref 1.010–1.025)
Urobilinogen, UA: 0.2 E.U./dL
pH, UA: 5 (ref 5.0–8.0)

## 2020-01-31 MED ORDER — SULFAMETHOXAZOLE-TRIMETHOPRIM 800-160 MG PO TABS: 1.0000 | ORAL_TABLET | Freq: Two times a day (BID) | ORAL | 0 refills | Status: AC

## 2020-01-31 NOTE — Progress Notes (Signed)
Patient: Victoria Benson Female    DOB: 1956/12/10   64 y.o.   MRN: [id number] Visit Date: 01/31/2020  Today's Provider: Lavon Paganini, MD   Chief Complaint  Patient presents with  . Bladder Prolapse   Subjective:    I [place] S. Dimas, CMA, am acting as scribe for Lavon Paganini, MD.   HPI Patient here today C/O possible bladder prolapse x's 1 week. Patient reports that she can feel something that is not right. Patient denies any blood or abnormal discharge. Patient reports burning with urination. No fever, abd pain, flank pain.   No Known Allergies   Current Outpatient Medications:  .  anastrozole (ARIMIDEX) 1 MG tablet, TAKE ONE TABLET EVERY DAY, Disp: 90 tablet, Rfl: 3 .  CALCIUM-MAGNESIUM-VITAMIN D ER PO, Take 1 tablet by mouth daily., Disp: , Rfl:  .  cetirizine (ZYRTEC) 10 MG tablet, Take 10 mg daily by mouth., Disp: , Rfl:  .  fluticasone (FLONASE) 50 MCG/ACT nasal spray, USE TWO SPRAYS IN EACH NOSTRIL DAILY, Disp: 16 g, Rfl: 1 .  Multiple Vitamin (MULTIVITAMIN) capsule, Take 1 capsule by mouth daily., Disp: , Rfl:  .  Omega-3 Krill Oil 500 MG CAPS, Take 1,000 mg by mouth daily. , Disp: , Rfl:  .  sulfamethoxazole-trimethoprim (BACTRIM DS) 800-160 MG tablet, Take 1 tablet by mouth 2 (two) times daily for 5 days., Disp: 10 tablet, Rfl: 0  Review of Systems  Constitutional: Negative.   Respiratory: Negative.   Cardiovascular: Negative.   Genitourinary: Positive for decreased urine volume. Negative for dysuria, enuresis, flank pain, frequency, hematuria, urgency, vaginal bleeding and vaginal discharge.    Social History   Tobacco Use  . Smoking status: Never Smoker  . Smokeless tobacco: Never Used  Substance Use Topics  . Alcohol use: No      Objective:   BP 113/78 (BP Location: Left Arm, Patient Position: Sitting, Cuff Size: Large)   Pulse 86   Temp (!) 96.9 F (36.1 C) (Temporal)   Resp 16   Ht 5\' 2"  (1.575 m)   Wt 186 lb (84.4 kg)   BMI 34.02  kg/m  Vitals:   01/31/20 1327  BP: 113/78  Pulse: 86  Resp: 16  Temp: (!) 96.9 F (36.1 C)  TempSrc: Temporal  Weight: 186 lb (84.4 kg)  Height: 5\' 2"  (1.575 m)  Body mass index is 34.02 kg/m.   Physical Exam Vitals reviewed.  Constitutional:      General: She is not in acute distress.    Appearance: Normal appearance. She is not diaphoretic.  HENT:     Head: Normocephalic and atraumatic.  Cardiovascular:     Rate and Rhythm: Normal rate and regular rhythm.     Pulses: Normal pulses.     Heart sounds: Normal heart sounds. No murmur.  Pulmonary:     Effort: Pulmonary effort is normal. No respiratory distress.     Breath sounds: Normal breath sounds. No wheezing.  Abdominal:     General: There is no distension.     Palpations: Abdomen is soft.     Tenderness: There is no abdominal tenderness.  Genitourinary:    Comments: GYN:  External genitalia within normal limits.  Vaginal mucosa pink, moist, normal rugae.  Nonfriable cervix without lesions, no discharge or bleeding noted on speculum exam.  With one side of speculum, cystocele is visible with worsening with valsalva. Very mild bulging over rectum, unchanged with valsalva    Skin:  General: Skin is warm and dry.     Findings: No rash.  Neurological:     Mental Status: She is alert and oriented to person, place, and time. Mental status is at baseline.  Psychiatric:        Mood and Affect: Mood normal.        Behavior: Behavior normal.      Results for orders placed or performed in visit on 01/31/20  POCT urinalysis dipstick  Result Value Ref Range   Color, UA yellow    Clarity, UA clear    Glucose, UA Negative Negative   Bilirubin, UA Negative    Ketones, UA Negative    Spec Grav, UA 1.010 1.010 - 1.025   Blood, UA small    pH, UA 5.0 5.0 - 8.0   Protein, UA Negative Negative   Urobilinogen, UA 0.2 0.2 or 1.0 E.U./dL   Nitrite, UA Negative    Leukocytes, UA Moderate (2+) (A) Negative       Assessment  & Plan    1. Cystitis - Symptoms and UA consistent with UTI -No systemic symptoms or signs of pyelonephritis -Will start treatment with 5day course of Bactrim after reviewing previous urine culture  -We will send urine culture to confirm sensitivities -Discussed return precautions  - Urine Culture  2. Cystocele without uterine prolapse - new problem - explained to patient that this likely did not occur quickly, like it seemed to  - likely that it was slowly worsening and then was noted after it crossed a threshold - will refer to GYN for further eval and management - discussed some possibilities with the patient - pessary, pelvic floor PT, and sling procedure - Ambulatory referral to Gynecology    Meds ordered this encounter  Medications  . sulfamethoxazole-trimethoprim (BACTRIM DS) 800-160 MG tablet    Sig: Take 1 tablet by mouth 2 (two) times daily for 5 days.    Dispense:  10 tablet    Refill:  0     Return if symptoms worsen or fail to improve.   The entirety of the information documented in the History of Present Illness, Review of Systems and Physical Exam were personally obtained by me. Portions of this information were initially documented by Lynford Humphrey, CMA and reviewed by me for thoroughness and accuracy.    , Dionne Bucy, MD MPH [facility]

## 2020-01-31 NOTE — Patient Instructions (Signed)
Urinary Tract Infection, Adult A urinary tract infection (UTI) is an infection of any part of the urinary tract. The urinary tract includes:  The kidneys.  The ureters.  The bladder.  The urethra. These organs make, store, and get rid of pee (urine) in the body. What are the causes? This is caused by germs (bacteria) in your genital area. These germs grow and cause swelling (inflammation) of your urinary tract. What increases the risk? You are more likely to develop this condition if:  You have a small, thin tube (catheter) to drain pee.  You cannot control when you pee or poop (incontinence).  You are female, and: ? You use these methods to prevent pregnancy:  A medicine that kills sperm (spermicide).  A device that blocks sperm (diaphragm). ? You have low levels of a female hormone (estrogen). ? You are pregnant.  You have genes that add to your risk.  You are sexually active.  You take antibiotic medicines.  You have trouble peeing because of: ? A prostate that is bigger than normal, if you are female. ? A blockage in the part of your body that drains pee from the bladder (urethra). ? A kidney stone. ? A nerve condition that affects your bladder (neurogenic bladder). ? Not getting enough to drink. ? Not peeing often enough.  You have other conditions, such as: ? Diabetes. ? A weak disease-fighting system (immune system). ? Sickle cell disease. ? Gout. ? Injury of the spine. What are the signs or symptoms? Symptoms of this condition include:  Needing to pee right away (urgently).  Peeing often.  Peeing small amounts often.  Pain or burning when peeing.  Blood in the pee.  Pee that smells bad or not like normal.  Trouble peeing.  Pee that is cloudy.  Fluid coming from the vagina, if you are female.  Pain in the belly or lower back. Other symptoms include:  Throwing up (vomiting).  No urge to eat.  Feeling mixed up (confused).  Being tired  and grouchy (irritable).  A fever.  Watery poop (diarrhea). How is this treated? This condition may be treated with:  Antibiotic medicine.  Other medicines.  Drinking enough water. Follow these instructions at home:  Medicines  Take over-the-counter and prescription medicines only as told by your doctor.  If you were prescribed an antibiotic medicine, take it as told by your doctor. Do not stop taking it even if you start to feel better. General instructions  Make sure you: ? Pee until your bladder is empty. ? Do not hold pee for a long time. ? Empty your bladder after sex. ? Wipe from front to back after pooping if you are a female. Use each tissue one time when you wipe.  Drink enough fluid to keep your pee pale yellow.  Keep all follow-up visits as told by your doctor. This is important. Contact a doctor if:  You do not get better after 1-2 days.  Your symptoms go away and then come back. Get help right away if:  You have very bad back pain.  You have very bad pain in your lower belly.  You have a fever.  You are sick to your stomach (nauseous).  You are throwing up. Summary  A urinary tract infection (UTI) is an infection of any part of the urinary tract.  This condition is caused by germs in your genital area.  There are many risk factors for a UTI. These include having a small, thin   tube to drain pee and not being able to control when you pee or poop.  Treatment includes antibiotic medicines for germs.  Drink enough fluid to keep your pee pale yellow. This information is not intended to replace advice given to you by your health care provider. Make sure you discuss any questions you have with your health care provider. Document Revised: 12/03/2018 Document Reviewed: 06/25/2018 Elsevier Patient Education  2020 Elsevier Inc.  

## 2020-02-02 ENCOUNTER — Telehealth: Payer: Self-pay

## 2020-02-02 LAB — URINE CULTURE

## 2020-02-02 NOTE — Telephone Encounter (Signed)
Result Communications   Result Notes and Comments to Patient Comment seen by patient SHIKHA SAWDY on 02/02/2020 11:41 AM EST

## 2020-02-02 NOTE — Telephone Encounter (Signed)
-----   Message from Virginia Crews, MD sent at 02/02/2020 11:40 AM EST ----- Urine culture confirms UTI that is sensitive to abx prescribed.  Hope she is doing better.

## 2020-02-14 ENCOUNTER — Other Ambulatory Visit: Payer: Self-pay

## 2020-02-15 ENCOUNTER — Ambulatory Visit: Payer: 59

## 2020-02-15 ENCOUNTER — Inpatient Hospital Stay: Payer: 59 | Attending: Oncology

## 2020-02-15 ENCOUNTER — Other Ambulatory Visit: Payer: Self-pay

## 2020-02-15 DIAGNOSIS — Z8262 Family history of osteoporosis: Secondary | ICD-10-CM | POA: Insufficient documentation

## 2020-02-15 DIAGNOSIS — Z85828 Personal history of other malignant neoplasm of skin: Secondary | ICD-10-CM | POA: Diagnosis not present

## 2020-02-15 DIAGNOSIS — Z801 Family history of malignant neoplasm of trachea, bronchus and lung: Secondary | ICD-10-CM | POA: Insufficient documentation

## 2020-02-15 DIAGNOSIS — Z8572 Personal history of non-Hodgkin lymphomas: Secondary | ICD-10-CM | POA: Diagnosis not present

## 2020-02-15 DIAGNOSIS — Z170 Estrogen receptor positive status [ER+]: Secondary | ICD-10-CM | POA: Diagnosis not present

## 2020-02-15 DIAGNOSIS — Z8041 Family history of malignant neoplasm of ovary: Secondary | ICD-10-CM | POA: Diagnosis not present

## 2020-02-15 DIAGNOSIS — Z833 Family history of diabetes mellitus: Secondary | ICD-10-CM | POA: Insufficient documentation

## 2020-02-15 DIAGNOSIS — Z923 Personal history of irradiation: Secondary | ICD-10-CM | POA: Diagnosis not present

## 2020-02-15 DIAGNOSIS — C50911 Malignant neoplasm of unspecified site of right female breast: Secondary | ICD-10-CM | POA: Diagnosis not present

## 2020-02-15 DIAGNOSIS — Z79811 Long term (current) use of aromatase inhibitors: Secondary | ICD-10-CM | POA: Insufficient documentation

## 2020-02-15 DIAGNOSIS — Z9221 Personal history of antineoplastic chemotherapy: Secondary | ICD-10-CM | POA: Diagnosis not present

## 2020-02-15 DIAGNOSIS — Z8249 Family history of ischemic heart disease and other diseases of the circulatory system: Secondary | ICD-10-CM | POA: Insufficient documentation

## 2020-02-15 DIAGNOSIS — C8331 Diffuse large B-cell lymphoma, lymph nodes of head, face, and neck: Secondary | ICD-10-CM

## 2020-02-15 DIAGNOSIS — C50211 Malignant neoplasm of upper-inner quadrant of right female breast: Secondary | ICD-10-CM

## 2020-02-15 LAB — CBC WITH DIFFERENTIAL/PLATELET
Abs Immature Granulocytes: 0.02 10*3/uL (ref 0.00–0.07)
Basophils Absolute: 0 10*3/uL (ref 0.0–0.1)
Basophils Relative: 1 %
Eosinophils Absolute: 0.1 10*3/uL (ref 0.0–0.5)
Eosinophils Relative: 1 %
HCT: 43.9 % (ref 36.0–46.0)
Hemoglobin: 14 g/dL (ref 12.0–15.0)
Immature Granulocytes: 0 %
Lymphocytes Relative: 18 %
Lymphs Abs: 1 10*3/uL (ref 0.7–4.0)
MCH: 29.6 pg (ref 26.0–34.0)
MCHC: 31.9 g/dL (ref 30.0–36.0)
MCV: 92.8 fL (ref 80.0–100.0)
Monocytes Absolute: 0.5 10*3/uL (ref 0.1–1.0)
Monocytes Relative: 8 %
Neutro Abs: 3.8 10*3/uL (ref 1.7–7.7)
Neutrophils Relative %: 72 %
Platelets: 173 10*3/uL (ref 150–400)
RBC: 4.73 MIL/uL (ref 3.87–5.11)
RDW: 13 % (ref 11.5–15.5)
WBC: 5.4 10*3/uL (ref 4.0–10.5)
nRBC: 0 % (ref 0.0–0.2)

## 2020-02-15 LAB — COMPREHENSIVE METABOLIC PANEL
ALT: 23 U/L (ref 0–44)
AST: 19 U/L (ref 15–41)
Albumin: 4.3 g/dL (ref 3.5–5.0)
Alkaline Phosphatase: 86 U/L (ref 38–126)
Anion gap: 7 (ref 5–15)
BUN: 10 mg/dL (ref 8–23)
CO2: 31 mmol/L (ref 22–32)
Calcium: 9.4 mg/dL (ref 8.9–10.3)
Chloride: 103 mmol/L (ref 98–111)
Creatinine, Ser: 0.58 mg/dL (ref 0.44–1.00)
GFR calc Af Amer: >60 mL/min (ref >60)
GFR calc non Af Amer: >60 mL/min (ref >60)
Glucose, Bld: 100 mg/dL — ABNORMAL HIGH (ref 70–99)
Potassium: 4.6 mmol/L (ref 3.5–5.1)
Sodium: 141 mmol/L (ref 135–145)
Total Bilirubin: 0.6 mg/dL (ref 0.3–1.2)
Total Protein: 7.1 g/dL (ref 6.5–8.1)

## 2020-02-15 LAB — LACTATE DEHYDROGENASE: LDH: 117 U/L (ref 98–192)

## 2020-02-21 ENCOUNTER — Ambulatory Visit (INDEPENDENT_AMBULATORY_CARE_PROVIDER_SITE_OTHER): Payer: 59 | Admitting: Obstetrics and Gynecology

## 2020-02-21 ENCOUNTER — Other Ambulatory Visit: Payer: Self-pay

## 2020-02-21 ENCOUNTER — Encounter: Payer: Self-pay | Admitting: Obstetrics and Gynecology

## 2020-02-21 VITALS — BP 114/64 | Ht 62.0 in | Wt 193.0 lb

## 2020-02-21 DIAGNOSIS — M629 Disorder of muscle, unspecified: Secondary | ICD-10-CM

## 2020-02-21 DIAGNOSIS — N812 Incomplete uterovaginal prolapse: Secondary | ICD-10-CM | POA: Diagnosis not present

## 2020-02-21 DIAGNOSIS — N8110 Cystocele, unspecified: Secondary | ICD-10-CM | POA: Diagnosis not present

## 2020-02-21 DIAGNOSIS — M6289 Other specified disorders of muscle: Secondary | ICD-10-CM

## 2020-02-21 DIAGNOSIS — Z853 Personal history of malignant neoplasm of breast: Secondary | ICD-10-CM | POA: Diagnosis not present

## 2020-02-21 DIAGNOSIS — N814 Uterovaginal prolapse, unspecified: Secondary | ICD-10-CM

## 2020-02-21 DIAGNOSIS — R29898 Other symptoms and signs involving the musculoskeletal system: Secondary | ICD-10-CM

## 2020-02-21 NOTE — Progress Notes (Signed)
Patient ID: Victoria Benson, female   DOB: November 16, 1956, 64 y.o.   MRN: [id number]  Reason for Consult: Referral (Refferal for prolapse, can feel something has fallen)   Referred by Virginia Crews, MD  Subjective:     HPI:  Victoria Benson is a 64 y.o. female she is here today for a consultation regarding prolapse.  She reports that she has an sensation of something falling.  She denies any problems with urinary or fecal incontinence or constipation.  She does report that she has some difficulty emptying her bladder with hesitancy when she voids.  She denies any cough laugh sneeze incontinence.  She does wake up at night to go to the bathroom although she did not quantifying the number of times.  She has not been having any problems with postmenopausal bleeding she is sexually active.  She denies any vaginal dryness.  She denies pain with intercourse.  She reports that she had 2 vaginal deliveries.  She did have significant birth trauma especially with her first child. She may have had a rectal tear but she is uncertain since it was a long time ago. She reports that her children weighed over 9 pounds.     Past Medical History:  Diagnosis Date  . BRCA gene mutation negative 07/10/2016  . Breast cancer ([facility]) 2009   T1c (1.2 cm) N0 ER 90%, PR 90%, HER-2/neu not amplified. Oncotype DX score, low risk (14) 9% chance of recurrence with antiestrogen therapy alone.  . Breast cancer ([facility]) 06-17-16   INVASIVE MAMMARY CARCINOMA . T1, ER positive, PR positive, HER-2/neu not overexpressed  . Diffuse large B-cell lymphoma ([facility]) 11/2017   Right cervical adenopathy  . Family history of adverse reaction to anesthesia    PTS MOM-HARD TO WAKE UP  . Heart murmur   . History of basal cell carcinoma 02/11/2011   right upper back  . History of dysplastic nevus 06/20/2011   left scapula  . Personal history of chemotherapy 2019   lymphoma  . Personal history of radiation therapy 2009   right breast cancer  .  Personal history of radiation therapy 2017   right breast cancer  . Radiation 2009   BREAST CA  . Skin cancer    Family History  Problem Relation Age of Onset  . CAD Mother   . Osteoporosis Mother   . Lung cancer Father   . Diabetes Father   . Hypertension Brother   . Ovarian cancer Maternal Grandmother   . Heart attack Paternal Grandmother   . Diabetes Paternal Grandfather   . Fibromyalgia Brother   . Hypertension Brother   . Hypertension Brother   . Sleep apnea Brother   . Healthy Brother   . Sleep apnea Brother   . Ovarian cancer Other   . Breast cancer Neg Hx    Past Surgical History:  Procedure Laterality Date  . APPENDECTOMY    . BREAST BIOPSY Right 06/17/2016   invasive. T1, ER positive, PR positive, HER-2/neu not overexpressed  . BREAST CYST ASPIRATION Left 2002  . BREAST EXCISIONAL BIOPSY Right 06/2016   lumpectomy with rad   . BREAST EXCISIONAL BIOPSY Right 2009   breast ca + mammo site radiation  . BREAST LUMPECTOMY WITH NEEDLE LOCALIZATION Right 07/18/2016   Procedure: BREAST LUMPECTOMY WITH NEEDLE LOCALIZATION AND RIGHT AXILLARY EXPLORATION;  Surgeon: Robert Bellow, MD;  Location: [facility] ORS;  Service: General;  Laterality: Right;  . BREAST SURGERY Right July 2009   Wide  excision, sentinel left node biopsy MammoSite. ( 06/21/2008  . CHOLECYSTECTOMY  1984  . COLONOSCOPY WITH PROPOFOL N/A 10/13/2018   Procedure: COLONOSCOPY WITH PROPOFOL;  Surgeon: Lucilla Lame, MD;  Location: [facility] ENDOSCOPY;  Service: Endoscopy;  Laterality: N/A;  . DILATION AND CURETTAGE OF UTERUS  2004  . ENDOMETRIAL ABLATION  2005  . IR FLUORO GUIDE PORT INSERTION RIGHT  01/15/2018  . TUMOR REMOVAL  12/2013   from uterus done by [facility]    Short Social History:  Social History   Tobacco Use  . Smoking status: Never Smoker  . Smokeless tobacco: Never Used  Substance Use Topics  . Alcohol use: No    No Known Allergies  Current Outpatient Medications  Medication Sig  Dispense Refill  . anastrozole (ARIMIDEX) 1 MG tablet TAKE ONE TABLET EVERY DAY 90 tablet 3  . CALCIUM-MAGNESIUM-VITAMIN D ER PO Take 1 tablet by mouth daily.    . cetirizine (ZYRTEC) 10 MG tablet Take 10 mg daily by mouth.    . fluticasone (FLONASE) 50 MCG/ACT nasal spray USE TWO SPRAYS IN EACH NOSTRIL DAILY 16 g 1  . Multiple Vitamin (MULTIVITAMIN) capsule Take 1 capsule by mouth daily.    . Omega-3 Krill Oil 500 MG CAPS Take 1,000 mg by mouth daily.      No current facility-administered medications for this visit.    Review of Systems  Constitutional: Negative for chills, fatigue, fever and unexpected weight change.  HENT: Negative for trouble swallowing.  Eyes: Negative for loss of vision.  Respiratory: Negative for cough, shortness of breath and wheezing.  Cardiovascular: Negative for chest pain, leg swelling, palpitations and syncope.  GI: Negative for abdominal pain, blood in stool, diarrhea, nausea and vomiting.  GU: Negative for difficulty urinating, dysuria, frequency and hematuria.  Musculoskeletal: Negative for back pain, leg pain and joint pain.  Skin: Negative for rash.  Neurological: Negative for dizziness, headaches, light-headedness, numbness and seizures.  Psychiatric: Negative for behavioral problem, confusion, depressed mood and sleep disturbance.       Objective:  Objective   Vitals:   02/21/20 1552  BP: 114/64  Weight: 193 lb (87.5 kg)  Height: '5\' 2"'$  (1.575 m)   Body mass index is 35.3 kg/m.  Physical Exam Vitals and nursing note reviewed.  Constitutional:      Appearance: She is well-developed.  HENT:     Head: Normocephalic and atraumatic.  Eyes:     Pupils: Pupils are equal, round, and reactive to light.  Cardiovascular:     Rate and Rhythm: Normal rate and regular rhythm.  Pulmonary:     Effort: Pulmonary effort is normal. No respiratory distress.  Genitourinary:    Rectum: Normal.     Comments: External: Normal appearing vulva. No lesions  noted.  Speculum examination: Normal appearing cervix. No blood in the vaginal vault. No discharge.  Bimanual examination: Uterus midline, non-tender, normal in size, shape and contour.  No CMT. No adnexal masses. No adnexal tenderness. Pelvis not fixed.  2/5 pelvic strength with squeeze. Pelvic muscles tender to palpation bilaterally Rectal exam. Normal tone. No rectal masses Normal rectal/vaginal exam. Skin:    General: Skin is warm and dry.  Neurological:     Mental Status: She is alert and oriented to person, place, and time.  Psychiatric:        Behavior: Behavior normal.        Thought Content: Thought content normal.        Judgment: Judgment normal.   POP-Q  exam:      Aa = -2 Ba = -1 C = -3  gH = 4 pb = 2 TVL = 7  Ap = -3 BP = -3 D = -6   Summary statement of POP-Q exam:    the anterior vaginal wall is 1 cm above the level of the hymen the cuff / cervix is 3 cm above the level of the hymen,  and the posterior vaginal wall is 3 cm above the level of the hymen  Stage 2 Cystocele/ anterior wall prolapse Stage 1 Apical prolapse      Assessment/Plan:     64 year old with stage II anterior wall prolapse stage I apical prolapse.  Discussed diagnosis with the patient in detail provide patient with [place] handouts regarding prolapse as well as exercises for strengthening the pelvic floor muscles and options for treatment such as a pessary or surgery.  Discussed that since the patient is still sexually active and she desires strongly to continue sexual activity that she could she and her partner could learn how to place and remove the pessary which would allow him to remain sexually active.  Discussed the process for fitting of pessary as well as maintenance of the pessary with the visits every 3 to 4 months to clean the pessary.  Discussed the downsides such as erosion or vaginal wall irritation.  The patient does have a history of breast cancer and would generally want to avoid  application of topical estrogen.  Discussed the options for surgery such as a anterior repair and a apical suspension.  Discussed that for this type of surgery I would refer her to a urogynecological specialist.  Discussed options for pelvic floor physical therapy.  Patient is most interested in pelvic floor physical therapy at this time referral was placed at the end of this visit.  She will follow up for further care as desired.  More than 45 minutes were spent face to face with the patient in the room with more than 50% of the time spent providing counseling and discussing the plan of management.    Adrian Prows MD [facility] 02/21/2020 5:57 PM

## 2020-02-23 ENCOUNTER — Encounter: Payer: Self-pay | Admitting: Obstetrics and Gynecology

## 2020-02-25 ENCOUNTER — Other Ambulatory Visit: Payer: 59

## 2020-02-25 ENCOUNTER — Encounter: Payer: Self-pay | Admitting: Oncology

## 2020-02-25 ENCOUNTER — Inpatient Hospital Stay (HOSPITAL_BASED_OUTPATIENT_CLINIC_OR_DEPARTMENT_OTHER): Payer: 59 | Admitting: Oncology

## 2020-02-25 ENCOUNTER — Other Ambulatory Visit: Payer: Self-pay

## 2020-02-25 DIAGNOSIS — Z08 Encounter for follow-up examination after completed treatment for malignant neoplasm: Secondary | ICD-10-CM

## 2020-02-25 DIAGNOSIS — Z8579 Personal history of other malignant neoplasms of lymphoid, hematopoietic and related tissues: Secondary | ICD-10-CM | POA: Diagnosis not present

## 2020-02-25 NOTE — Progress Notes (Signed)
Patient stated that this week she was diagnosed with bladder prolapse. Patient also wanted to know if she could get the COVID-19 vaccine.

## 2020-02-28 NOTE — Progress Notes (Signed)
I connected with Victoria Benson on 02/28/20 at  2:15 PM EST by video enabled telemedicine visit and verified that I am speaking with the correct person using two identifiers.   I discussed the limitations, risks, security and privacy concerns of performing an evaluation and management service by telemedicine and the availability of in-person appointments. I also discussed with the patient that there may be a patient responsible charge related to this service. The patient expressed understanding and agreed to proceed.  Other persons participating in the visit and their role in the encounter:  none  Patient's location:  home Provider's location:  work  Risk analyst Complaint: Surveillance visit for diffuse large B-cell lymphoma  History of present illness: patient is a 64 year old female who developed symptoms of upper respiratory infection and nasal congestion sometime in November 2018. Around the same time she also developed a right neck mass which was initially attributed to possible tonsillitis and she was given antibiotics for the same. However her right neck mass did not decrease in size and she eventually saw Dr. Pryor Ochoa from ENT. Patient was found to have significant right cervical adenopathy and underwent CT of the soft tissue neck which showed right level 2 and 3 adenopathy favoring metastatic carcinoma. Jugulodigastric node measuring up to 3.7 cm. Asymmetric fullness of the right tonsillar fossa possibly primary site.  Patient underwent core biopsy of the right cervical lymph node.Pathology showed diffuse large B-cell lymphoma of the ABC subtype. IHC was positive for BCL-2 and BCL 6 see Mick IHC was not tested due to insufficiency of the testing sample. Ki-67 was high 80-90%.  PET/CT scan showed hypermetabolismin the tonsillar fossa bilaterally right greater than left as well as hypermetabolic lymphadenopathy in the right neck at the level 2 and level 3. No evidence of contralateral cervical  adenopathy and no evidence of metastatic disease elsewhere  Bone marrow biopsy showed no evidence of lymphoma. IPI score 0- low risk. Does not meet criteria for CNS prophylaxis  Patient finished 3 cycles of RCHOP on 02/27/18.PET showed no active disease. She then completed IFRTand is in CR1   Interval history: She reports feeling well overall and denies any complaints at this time   Review of Systems  Constitutional: Negative for chills, fever, malaise/fatigue and weight loss.  HENT: Negative for congestion, ear discharge and nosebleeds.   Eyes: Negative for blurred vision.  Respiratory: Negative for cough, hemoptysis, sputum production, shortness of breath and wheezing.   Cardiovascular: Negative for chest pain, palpitations, orthopnea and claudication.  Gastrointestinal: Negative for abdominal pain, blood in stool, constipation, diarrhea, heartburn, melena, nausea and vomiting.  Genitourinary: Negative for dysuria, flank pain, frequency, hematuria and urgency.  Musculoskeletal: Negative for back pain, joint pain and myalgias.  Skin: Negative for rash.  Neurological: Negative for dizziness, tingling, focal weakness, seizures, weakness and headaches.  Endo/Heme/Allergies: Does not bruise/bleed easily.  Psychiatric/Behavioral: Negative for depression and suicidal ideas. The patient does not have insomnia.     No Known Allergies  Past Medical History:  Diagnosis Date  . BRCA gene mutation negative 07/10/2016  . Breast cancer ([place]) 2009   T1c (1.2 cm) N0 ER 90%, PR 90%, HER-2/neu not amplified. Oncotype DX score, low risk (14) 9% chance of recurrence with antiestrogen therapy alone.  . Breast cancer ([place]) 06-17-16   INVASIVE MAMMARY CARCINOMA . T1, ER positive, PR positive, HER-2/neu not overexpressed  . Diffuse large B-cell lymphoma ([place]) 11/2017   Right cervical adenopathy  . Family history of adverse reaction to anesthesia  PTS MOM-HARD TO WAKE UP  . Heart murmur   . History  of basal cell carcinoma 02/11/2011   right upper back  . History of dysplastic nevus 06/20/2011   left scapula  . Personal history of chemotherapy 2019   lymphoma  . Personal history of radiation therapy 2009   right breast cancer  . Personal history of radiation therapy 2017   right breast cancer  . Radiation 2009   BREAST CA  . Skin cancer     Past Surgical History:  Procedure Laterality Date  . APPENDECTOMY    . BREAST BIOPSY Right 06/17/2016   invasive. T1, ER positive, PR positive, HER-2/neu not overexpressed  . BREAST CYST ASPIRATION Left 2002  . BREAST EXCISIONAL BIOPSY Right 06/2016   lumpectomy with rad   . BREAST EXCISIONAL BIOPSY Right 2009   breast ca + mammo site radiation  . BREAST LUMPECTOMY WITH NEEDLE LOCALIZATION Right 07/18/2016   Procedure: BREAST LUMPECTOMY WITH NEEDLE LOCALIZATION AND RIGHT AXILLARY EXPLORATION;  Surgeon: Robert Bellow, MD;  Location: [facility] ORS;  Service: General;  Laterality: Right;  . BREAST SURGERY Right July 2009   Wide excision, sentinel left node biopsy MammoSite. ( 06/21/2008  . CHOLECYSTECTOMY  1984  . COLONOSCOPY WITH PROPOFOL N/A 10/13/2018   Procedure: COLONOSCOPY WITH PROPOFOL;  Surgeon: Lucilla Lame, MD;  Location[facility] ENDOSCOPY;  Service: Endoscopy;  Laterality: N/A;  . DILATION AND CURETTAGE OF UTERUS  2004  . ENDOMETRIAL ABLATION  2005  . IR FLUORO GUIDE PORT INSERTION RIGHT  01/15/2018  . TUMOR REMOVAL  12/2013   from uterus done by [facility]    Social History   Socioeconomic History  . Marital status: Married    Spouse name: [name]  . Number of children: 2  . Years of education: 21  . Highest education level: Not on file  Occupational History  . Occupation: unemployed  Tobacco Use  . Smoking status: Never Smoker  . Smokeless tobacco: Never Used  Substance and Sexual Activity  . Alcohol use: No  . Drug use: No  . Sexual activity: Yes    Birth control/protection: Post-menopausal  Other Topics Concern  .  Not on file  Social History Narrative  . Not on file   Social Determinants of Health   Financial Resource Strain:   . Difficulty of Paying Living Expenses: Not on file  Food Insecurity:   . Worried About Charity fundraiser in the Last Year: Not on file  . Ran Out of Food in the Last Year: Not on file  Transportation Needs:   . Lack of Transportation (Medical): Not on file  . Lack of Transportation (Non-Medical): Not on file  Physical Activity:   . Days of Exercise per Week: Not on file  . Minutes of Exercise per Session: Not on file  Stress:   . Feeling of Stress : Not on file  Social Connections:   . Frequency of Communication with Friends and Family: Not on file  . Frequency of Social Gatherings with Friends and Family: Not on file  . Attends Religious Services: Not on file  . Active Member of Clubs or Organizations: Not on file  . Attends Archivist Meetings: Not on file  . Marital Status: Not on file  Intimate Partner Violence:   . Fear of Current or Ex-Partner: Not on file  . Emotionally Abused: Not on file  . Physically Abused: Not on file  . Sexually Abused: Not on file  Family History  Problem Relation Age of Onset  . CAD Mother   . Osteoporosis Mother   . Lung cancer Father   . Diabetes Father   . Hypertension Brother   . Ovarian cancer Maternal Grandmother   . Heart attack Paternal Grandmother   . Diabetes Paternal Grandfather   . Fibromyalgia Brother   . Hypertension Brother   . Hypertension Brother   . Sleep apnea Brother   . Healthy Brother   . Sleep apnea Brother   . Ovarian cancer Other   . Breast cancer Neg Hx      Current Outpatient Medications:  .  anastrozole (ARIMIDEX) 1 MG tablet, TAKE ONE TABLET EVERY DAY, Disp: 90 tablet, Rfl: 3 .  CALCIUM-MAGNESIUM-VITAMIN D ER PO, Take 1 tablet by mouth daily., Disp: , Rfl:  .  cetirizine (ZYRTEC) 10 MG tablet, Take 10 mg daily by mouth., Disp: , Rfl:  .  fluticasone (FLONASE) 50 MCG/ACT  nasal spray, USE TWO SPRAYS IN EACH NOSTRIL DAILY, Disp: 16 g, Rfl: 1 .  Multiple Vitamin (MULTIVITAMIN) capsule, Take 1 capsule by mouth daily., Disp: , Rfl:  .  Omega-3 Krill Oil 500 MG CAPS, Take 1,000 mg by mouth daily. , Disp: , Rfl:   No results found.  No images are attached to the encounter.   CMP Latest Ref Rng & Units 02/15/2020  Glucose 70 - 99 mg/dL 100(H)  BUN 8 - 23 mg/dL 10  Creatinine 0.44 - 1.00 mg/dL 0.58  Sodium 135 - 145 mmol/L 141  Potassium 3.5 - 5.1 mmol/L 4.6  Chloride 98 - 111 mmol/L 103  CO2 22 - 32 mmol/L 31  Calcium 8.9 - 10.3 mg/dL 9.4  Total Protein 6.5 - 8.1 g/dL 7.1  Total Bilirubin 0.3 - 1.2 mg/dL 0.6  Alkaline Phos 38 - 126 U/L 86  AST 15 - 41 U/L 19  ALT 0 - 44 U/L 23   CBC Latest Ref Rng & Units 02/15/2020  WBC 4.0 - 10.5 K/uL 5.4  Hemoglobin 12.0 - 15.0 g/dL 14.0  Hematocrit 36.0 - 46.0 % 43.9  Platelets 150 - 400 K/uL 173     Observation/objective: Appears in no acute distress of a video visit today.  Breathing is nonlabored  Assessment and plan: Patient is a 64 year old female with history of stage I diffuse large B-cell lymphoma s/p 3 cycles of R-CHOP chemotherapy and IMRT currently in CR 1.  This is a routine follow-up visit Clinically patient is doing well and no concerning symptoms of recurrence based on today's visit.  CBC CMP and LDH are normal.  Patient is also on anastrozole for breast cancer and follows up with Dr. Bary Castilla for the same  Follow-up instructions: I will see her back in 6 months for an in person visit with CBC with differential CMP and LDH  I discussed the assessment and treatment plan with the patient. The patient was provided an opportunity to ask questions and all were answered. The patient agreed with the plan and demonstrated an understanding of the instructions.   The patient was advised to call back or seek an in-person evaluation if the symptoms worsen or if the condition fails to improve as  anticipated.    Visit Diagnosis: 1. Encounter for follow-up surveillance of diffuse large B-cell lymphoma     Dr. Randa Evens, MD, MPH [facility] at [facility] Tel- [phone] 02/28/2020 12:55 PM

## 2020-03-08 ENCOUNTER — Encounter: Payer: Self-pay | Admitting: Obstetrics and Gynecology

## 2020-03-13 ENCOUNTER — Other Ambulatory Visit: Payer: Self-pay | Admitting: Family Medicine

## 2020-05-26 ENCOUNTER — Other Ambulatory Visit: Payer: Self-pay | Admitting: Family Medicine

## 2020-05-26 NOTE — Telephone Encounter (Signed)
Requested Prescriptions  Pending Prescriptions Disp Refills  . fluticasone (FLONASE) 50 MCG/ACT nasal spray [Pharmacy Med Name: FLUTICASONE PROPIONATE 50 MCG/ACT N] 16 g 1    Sig: USE TWO SPRAYS IN EACH NOSTRIL DAILY     Ear, Nose, and Throat: Nasal Preparations - Corticosteroids Passed - 05/26/2020  8:02 AM      Passed - Valid encounter within last 12 months    Recent Outpatient Visits          3 months ago [facility], Dionne Bucy, MD   5 months ago Need for shingles vaccine   [facility], Dionne Bucy, MD   8 months ago Encounter for annual physical exam   [facility], Dionne Bucy, MD   1 year ago Acute cystitis with hematuria   [facility], Dionne Bucy, MD   1 year ago Encounter for annual physical exam   [facility], Dionne Bucy, MD

## 2020-07-12 ENCOUNTER — Other Ambulatory Visit: Payer: Self-pay | Admitting: General Surgery

## 2020-07-12 DIAGNOSIS — C50211 Malignant neoplasm of upper-inner quadrant of right female breast: Secondary | ICD-10-CM

## 2020-08-03 ENCOUNTER — Ambulatory Visit
Admission: RE | Admit: 2020-08-03 | Discharge: 2020-08-03 | Disposition: A | Discharge status: Home / Self Care | Payer: 59 | Source: Ambulatory Visit | Attending: General Surgery | Admitting: General Surgery

## 2020-08-03 ENCOUNTER — Other Ambulatory Visit: Payer: Self-pay

## 2020-08-03 DIAGNOSIS — Z170 Estrogen receptor positive status [ER+]: Secondary | ICD-10-CM

## 2020-08-03 DIAGNOSIS — C50211 Malignant neoplasm of upper-inner quadrant of right female breast: Secondary | ICD-10-CM

## 2020-08-17 ENCOUNTER — Other Ambulatory Visit: Payer: Self-pay | Admitting: General Surgery

## 2020-08-18 LAB — SURGICAL PATHOLOGY

## 2020-08-25 ENCOUNTER — Encounter: Payer: Self-pay | Admitting: Oncology

## 2020-08-25 ENCOUNTER — Inpatient Hospital Stay: Payer: 59 | Attending: Oncology

## 2020-08-25 ENCOUNTER — Inpatient Hospital Stay (HOSPITAL_BASED_OUTPATIENT_CLINIC_OR_DEPARTMENT_OTHER): Payer: 59 | Admitting: Oncology

## 2020-08-25 ENCOUNTER — Other Ambulatory Visit: Payer: Self-pay

## 2020-08-25 VITALS — BP 121/79 | HR 73 | Temp 96.2°F | Resp 16 | Wt 186.5 lb

## 2020-08-25 DIAGNOSIS — Z08 Encounter for follow-up examination after completed treatment for malignant neoplasm: Secondary | ICD-10-CM

## 2020-08-25 DIAGNOSIS — Z85828 Personal history of other malignant neoplasm of skin: Secondary | ICD-10-CM | POA: Diagnosis not present

## 2020-08-25 DIAGNOSIS — Z8572 Personal history of non-Hodgkin lymphomas: Secondary | ICD-10-CM | POA: Insufficient documentation

## 2020-08-25 DIAGNOSIS — Z923 Personal history of irradiation: Secondary | ICD-10-CM | POA: Diagnosis not present

## 2020-08-25 DIAGNOSIS — Z8579 Personal history of other malignant neoplasms of lymphoid, hematopoietic and related tissues: Secondary | ICD-10-CM

## 2020-08-25 DIAGNOSIS — Z9221 Personal history of antineoplastic chemotherapy: Secondary | ICD-10-CM | POA: Diagnosis not present

## 2020-08-25 DIAGNOSIS — Z801 Family history of malignant neoplasm of trachea, bronchus and lung: Secondary | ICD-10-CM | POA: Diagnosis not present

## 2020-08-25 DIAGNOSIS — Z86018 Personal history of other benign neoplasm: Secondary | ICD-10-CM | POA: Diagnosis not present

## 2020-08-25 LAB — COMPREHENSIVE METABOLIC PANEL
ALT: 22 U/L (ref 0–44)
AST: 20 U/L (ref 15–41)
Albumin: 4.3 g/dL (ref 3.5–5.0)
Alkaline Phosphatase: 84 U/L (ref 38–126)
Anion gap: 9 (ref 5–15)
BUN: 11 mg/dL (ref 8–23)
CO2: 30 mmol/L (ref 22–32)
Calcium: 9.8 mg/dL (ref 8.9–10.3)
Chloride: 102 mmol/L (ref 98–111)
Creatinine, Ser: 0.7 mg/dL (ref 0.44–1.00)
GFR calc Af Amer: >60 mL/min (ref >60)
GFR calc non Af Amer: >60 mL/min (ref >60)
Glucose, Bld: 111 mg/dL — ABNORMAL HIGH (ref 70–99)
Potassium: 4.4 mmol/L (ref 3.5–5.1)
Sodium: 141 mmol/L (ref 135–145)
Total Bilirubin: 0.6 mg/dL (ref 0.3–1.2)
Total Protein: 7.2 g/dL (ref 6.5–8.1)

## 2020-08-25 LAB — CBC
HCT: 41.7 % (ref 36.0–46.0)
Hemoglobin: 14.3 g/dL (ref 12.0–15.0)
MCH: 29.9 pg (ref 26.0–34.0)
MCHC: 34.3 g/dL (ref 30.0–36.0)
MCV: 87.2 fL (ref 80.0–100.0)
Platelets: 166 10*3/uL (ref 150–400)
RBC: 4.78 MIL/uL (ref 3.87–5.11)
RDW: 12.8 % (ref 11.5–15.5)
WBC: 5.6 10*3/uL (ref 4.0–10.5)
nRBC: 0 % (ref 0.0–0.2)

## 2020-08-25 LAB — LACTATE DEHYDROGENASE: LDH: 124 U/L (ref 98–192)

## 2020-08-25 NOTE — Progress Notes (Signed)
Hematology/Oncology Consult note [facility]  Telephone:([phone] [phone]  Patient Care Team: Virginia Crews, MD as PCP - General (Family Medicine) Margarita Rana, MD as Referring Physician (Family Medicine) Bary Castilla, Forest Gleason, MD (General Surgery) Sindy Guadeloupe, MD as Consulting Physician (Oncology) Noreene Filbert, MD as Referring Physician (Radiation Oncology)   Name of the patient: Victoria Benson  [id number]  09-25-1956   Date of visit: 08/25/20  Diagnosis-stage I diffuse large B-cell lymphoma in CR 1  Chief complaint/ Reason for visit-routine follow-up of diffuse large B-cell lymphoma  Heme/Onc history: patient is a 64 year old female who developed symptoms of upper respiratory infection and nasal congestion sometime in November 2018. Around the same time she also developed a right neck mass which was initially attributed to possible tonsillitis and she was given antibiotics for the same. However her right neck mass did not decrease in size and she eventually saw Dr. Pryor Ochoa from ENT. Patient was found to have significant right cervical adenopathy and underwent CT of the soft tissue neck which showed right level 2 and 3 adenopathy favoring metastatic carcinoma. Jugulodigastric node measuring up to 3.7 cm. Asymmetric fullness of the right tonsillar fossa possibly primary site.  Patient underwent core biopsy of the right cervical lymph node.Pathology showed diffuse large B-cell lymphoma of the ABC subtype. IHC was positive for BCL-2 and BCL 6 see Mick IHC was not tested due to insufficiency of the testing sample. Ki-67 was high 80-90%.  PET/CT scan showed hypermetabolismin the tonsillar fossa bilaterally right greater than left as well as hypermetabolic lymphadenopathy in the right neck at the level 2 and level 3. No evidence of contralateral cervical adenopathy and no evidence of metastatic disease elsewhere  Bone marrow biopsy  showed no evidence of lymphoma. IPI score 0- low risk. Does not meet criteria for CNS prophylaxis  Patient finished 3 cycles of RCHOP on 02/27/18.PET showed no active disease. She then completed IFRTand is in CR1   Interval history-patient reports doing well and denies any new complaints at this time.  She has some chronic dry mouth secondary to prior radiation but reports no difficulty swallowing.  Appetite and weight have remained stable  ECOG PS- 0 Pain scale- 0   Review of systems- Review of Systems  Constitutional: Negative for chills, fever, malaise/fatigue and weight loss.  HENT: Negative for congestion, ear discharge and nosebleeds.   Eyes: Negative for blurred vision.  Respiratory: Negative for cough, hemoptysis, sputum production, shortness of breath and wheezing.   Cardiovascular: Negative for chest pain, palpitations, orthopnea and claudication.  Gastrointestinal: Negative for abdominal pain, blood in stool, constipation, diarrhea, heartburn, melena, nausea and vomiting.  Genitourinary: Negative for dysuria, flank pain, frequency, hematuria and urgency.  Musculoskeletal: Negative for back pain, joint pain and myalgias.  Skin: Negative for rash.  Neurological: Negative for dizziness, tingling, focal weakness, seizures, weakness and headaches.  Endo/Heme/Allergies: Does not bruise/bleed easily.  Psychiatric/Behavioral: Negative for depression and suicidal ideas. The patient does not have insomnia.       No Known Allergies   Past Medical History:  Diagnosis Date   BRCA gene mutation negative 07/10/2016   MyRisk neg   Breast cancer ([place]) 2009   T1c (1.2 cm) N0 ER 90%, PR 90%, HER-2/neu not amplified. Oncotype DX score, low risk (14) 9% chance of recurrence with antiestrogen therapy alone.   Breast cancer ([place]) 06-17-16   INVASIVE MAMMARY CARCINOMA . T1, ER positive, PR positive, HER-2/neu not overexpressed   Diffuse  large B-cell lymphoma ([place]) 11/2017   Right  cervical adenopathy   Family history of adverse reaction to anesthesia    PTS MOM-HARD TO WAKE UP   Heart murmur    History of basal cell carcinoma 02/11/2011   right upper back   History of dysplastic nevus 06/20/2011   left scapula   Personal history of chemotherapy 2019   lymphoma   Personal history of radiation therapy 2009   right breast cancer   Personal history of radiation therapy 2017   right breast cancer   Radiation 2009   BREAST CA   Skin cancer      Past Surgical History:  Procedure Laterality Date   APPENDECTOMY     BREAST BIOPSY Right 06/17/2016   invasive. T1, ER positive, PR positive, HER-2/neu not overexpressed   BREAST CYST ASPIRATION Left 2002   BREAST EXCISIONAL BIOPSY Right 06/2016   lumpectomy with rad    BREAST EXCISIONAL BIOPSY Right 2009   breast ca + mammo site radiation   BREAST LUMPECTOMY Right 2017   invasive mammary NEG margins   BREAST LUMPECTOMY Right 2009   mammosite   BREAST LUMPECTOMY WITH NEEDLE LOCALIZATION Right 07/18/2016   Procedure: BREAST LUMPECTOMY WITH NEEDLE LOCALIZATION AND RIGHT AXILLARY EXPLORATION;  Surgeon: Robert Bellow, MD;  Location: [facility] ORS;  Service: General;  Laterality: Right;   BREAST SURGERY Right July 2009   Wide excision, sentinel left node biopsy MammoSite. ( 06/21/2008   CHOLECYSTECTOMY  1984   COLONOSCOPY WITH PROPOFOL N/A 10/13/2018   Procedure: COLONOSCOPY WITH PROPOFOL;  Surgeon: Lucilla Lame, MD;  Location: [facility] ENDOSCOPY;  Service: Endoscopy;  Laterality: N/A;   DILATION AND CURETTAGE OF UTERUS  2004   ENDOMETRIAL ABLATION  2005   IR FLUORO GUIDE PORT INSERTION RIGHT  01/15/2018   TUMOR REMOVAL  12/2013   from uterus done by [facility]    Social History   Socioeconomic History   Marital status: Married    Spouse name: [name]   Number of children: 2   Years of education: 12   Highest education level: Not on file  Occupational History   Occupation: unemployed    Tobacco Use   Smoking status: Never Smoker   Smokeless tobacco: Never Used  Scientific laboratory technician Use: Never used  Substance and Sexual Activity   Alcohol use: No   Drug use: No   Sexual activity: Yes    Birth control/protection: Post-menopausal  Other Topics Concern   Not on file  Social History Narrative   Not on file   Social Determinants of Health   Financial Resource Strain:    Difficulty of Paying Living Expenses: Not on file  Food Insecurity:    Worried About Charity fundraiser in the Last Year: Not on file   [employer] of Food in the Last Year: Not on file  Transportation Needs:    Lack of Transportation (Medical): Not on file   Lack of Transportation (Non-Medical): Not on file  Physical Activity:    Days of Exercise per Week: Not on file   Minutes of Exercise per Session: Not on file  Stress:    Feeling of Stress : Not on file  Social Connections:    Frequency of Communication with Friends and Family: Not on file   Frequency of Social Gatherings with Friends and Family: Not on file   Attends Religious Services: Not on file   Active Member of Clubs or Organizations: Not on file  Attends Archivist Meetings: Not on file   Marital Status: Not on file  Intimate Partner Violence:    Fear of Current or Ex-Partner: Not on file   Emotionally Abused: Not on file   Physically Abused: Not on file   Sexually Abused: Not on file    Family History  Problem Relation Age of Onset   CAD Mother    Osteoporosis Mother    Lung cancer Father    Diabetes Father    Hypertension Brother    Ovarian cancer Maternal Grandmother    Heart attack Paternal Grandmother    Diabetes Paternal Grandfather    Fibromyalgia Brother    Hypertension Brother    Hypertension Brother    Sleep apnea Brother    Healthy Brother    Sleep apnea Brother    Ovarian cancer Other    Breast cancer Neg Hx      Current Outpatient Medications:     anastrozole (ARIMIDEX) 1 MG tablet, TAKE ONE TABLET EVERY DAY, Disp: 90 tablet, Rfl: 3   CALCIUM-MAGNESIUM-VITAMIN D ER PO, Take 1 tablet by mouth daily., Disp: , Rfl:    cetirizine (ZYRTEC) 10 MG tablet, Take 10 mg daily by mouth., Disp: , Rfl:    fluticasone (FLONASE) 50 MCG/ACT nasal spray, USE TWO SPRAYS IN EACH NOSTRIL DAILY, Disp: 16 g, Rfl: 1   Multiple Vitamin (MULTIVITAMIN) capsule, Take 1 capsule by mouth daily., Disp: , Rfl:    Omega-3 Krill Oil 500 MG CAPS, Take 1,000 mg by mouth daily. , Disp: , Rfl:   Physical exam:  Vitals:   08/25/20 1017  BP: 121/79  Pulse: 73  Resp: 16  Temp: (!) 96.2 F (35.7 C)  TempSrc: Tympanic  SpO2: 100%  Weight: 186 lb 8 oz (84.6 kg)   Physical Exam Constitutional:      General: She is not in acute distress. Cardiovascular:     Rate and Rhythm: Normal rate and regular rhythm.     Heart sounds: Normal heart sounds.  Pulmonary:     Effort: Pulmonary effort is normal.     Breath sounds: Normal breath sounds.  Abdominal:     General: Bowel sounds are normal.     Palpations: Abdomen is soft.  Lymphadenopathy:     Comments: No palpable cervical, supraclavicular, axillary or inguinal adenopathy   Skin:    General: Skin is warm and dry.  Neurological:     Mental Status: She is alert and oriented to person, place, and time.      CMP Latest Ref Rng & Units 08/25/2020  Glucose 70 - 99 mg/dL 111(H)  BUN 8 - 23 mg/dL 11  Creatinine 0.44 - 1.00 mg/dL 0.70  Sodium 135 - 145 mmol/L 141  Potassium 3.5 - 5.1 mmol/L 4.4  Chloride 98 - 111 mmol/L 102  CO2 22 - 32 mmol/L 30  Calcium 8.9 - 10.3 mg/dL 9.8  Total Protein 6.5 - 8.1 g/dL 7.2  Total Bilirubin 0.3 - 1.2 mg/dL 0.6  Alkaline Phos 38 - 126 U/L 84  AST 15 - 41 U/L 20  ALT 0 - 44 U/L 22   CBC Latest Ref Rng & Units 08/25/2020  WBC 4.0 - 10.5 K/uL 5.6  Hemoglobin 12.0 - 15.0 g/dL 14.3  Hematocrit 36 - 46 % 41.7  Platelets 150 - 400 K/uL 166    No images are attached to the  encounter.  MM DIAG BREAST TOMO BILATERAL  Result Date: 08/03/2020 CLINICAL DATA:  64 year old female presenting for  routine annual surveillance status post right breast lumpectomy in 2017. EXAM: DIGITAL DIAGNOSTIC BILATERAL MAMMOGRAM WITH TOMO AND CAD COMPARISON:  Previous exam(s). ACR Breast Density Category a: The breast tissue is almost entirely fatty. FINDINGS: The right breast lumpectomy site is stable. No suspicious calcifications, masses or areas of distortion are seen in the bilateral breasts. Mammographic images were processed with CAD. IMPRESSION: Stable right breast lumpectomy site. No mammographic evidence of malignancy in the bilateral breasts. RECOMMENDATION: Diagnostic mammogram is suggested in 1 year. (Code:DM-B-01Y) I have discussed the findings and recommendations with the patient. If applicable, a reminder letter will be sent to the patient regarding the next appointment. BI-RADS CATEGORY  2: Benign. Electronically Signed   By: Ammie Ferrier M.D.   On: 08/03/2020 14:22     Assessment and plan- Patient is a 64 y.o. female with history of stage I diffuse large B-cell lymphoma s/p 3 cycles of R-CHOP chemotherapy and IMRT currently in CR 1.    This is a routine follow-up visit  Clinically patient is doing well with no concerning signs and symptoms of recurrence based on today's exam.  Labs remain unremarkable.  No indications of surveillance imaging at this time.  I will see her back in 6 months with CBC with differential CMP and LDH   Visit Diagnosis 1. Encounter for follow-up surveillance of diffuse large B-cell lymphoma      Dr. Randa Evens, MD, MPH [facility] [phone] 08/25/2020 12:58 PM

## 2020-09-06 ENCOUNTER — Other Ambulatory Visit: Payer: Self-pay | Admitting: Family Medicine

## 2020-09-06 MED ORDER — FLUTICASONE PROPIONATE 50 MCG/ACT NA SUSP: 2.0000 | Freq: Every day | NASAL | 5 refills | Status: DC

## 2020-09-06 NOTE — Telephone Encounter (Signed)
[facility] faxed refill request for the following medications:  fluticasone (FLONASE) 50 MCG/ACT nasal spray  Please advise.  Thanks, [employer]

## 2020-09-29 ENCOUNTER — Encounter: Payer: Self-pay | Admitting: Family Medicine

## 2020-09-29 ENCOUNTER — Other Ambulatory Visit: Payer: Self-pay

## 2020-09-29 ENCOUNTER — Ambulatory Visit (INDEPENDENT_AMBULATORY_CARE_PROVIDER_SITE_OTHER): Payer: 59 | Admitting: Family Medicine

## 2020-09-29 VITALS — BP 101/59 | HR 87 | Temp 98.3°F | Ht 62.0 in | Wt 187.0 lb

## 2020-09-29 DIAGNOSIS — E559 Vitamin D deficiency, unspecified: Secondary | ICD-10-CM | POA: Diagnosis not present

## 2020-09-29 DIAGNOSIS — Z170 Estrogen receptor positive status [ER+]: Secondary | ICD-10-CM

## 2020-09-29 DIAGNOSIS — E782 Mixed hyperlipidemia: Secondary | ICD-10-CM

## 2020-09-29 DIAGNOSIS — I83812 Varicose veins of left lower extremities with pain: Secondary | ICD-10-CM

## 2020-09-29 DIAGNOSIS — Z6834 Body mass index (BMI) 34.0-34.9, adult: Secondary | ICD-10-CM

## 2020-09-29 DIAGNOSIS — Z23 Encounter for immunization: Secondary | ICD-10-CM

## 2020-09-29 DIAGNOSIS — R8290 Unspecified abnormal findings in urine: Secondary | ICD-10-CM

## 2020-09-29 DIAGNOSIS — Z0000 Encounter for general adult medical examination without abnormal findings: Secondary | ICD-10-CM

## 2020-09-29 DIAGNOSIS — E038 Other specified hypothyroidism: Secondary | ICD-10-CM

## 2020-09-29 DIAGNOSIS — R600 Localized edema: Secondary | ICD-10-CM | POA: Diagnosis not present

## 2020-09-29 DIAGNOSIS — E669 Obesity, unspecified: Secondary | ICD-10-CM

## 2020-09-29 DIAGNOSIS — C8331 Diffuse large B-cell lymphoma, lymph nodes of head, face, and neck: Secondary | ICD-10-CM

## 2020-09-29 DIAGNOSIS — C50211 Malignant neoplasm of upper-inner quadrant of right female breast: Secondary | ICD-10-CM

## 2020-09-29 LAB — POCT URINALYSIS DIPSTICK
Bilirubin, UA: NEGATIVE
Blood, UA: NEGATIVE
Glucose, UA: NEGATIVE
Ketones, UA: NEGATIVE
Leukocytes, UA: NEGATIVE
Nitrite, UA: NEGATIVE
Protein, UA: NEGATIVE
Spec Grav, UA: 1.01 (ref 1.010–1.025)
Urobilinogen, UA: 0.2 E.U./dL
pH, UA: 6 (ref 5.0–8.0)

## 2020-09-29 NOTE — Assessment & Plan Note (Signed)
UA clear Reassurance given

## 2020-09-29 NOTE — Progress Notes (Signed)
Complete physical exam   Patient: Victoria Benson   DOB: [birth year]   64 y.o. Female  MRN: [id number] Visit Date: 09/29/2020  Today's healthcare provider: Lavon Paganini, MD   Chief Complaint  Patient presents with  . Annual Exam   Subjective    Victoria Benson is a 64 y.o. female who presents today for a complete physical exam.  She reports consuming a general diet. The patient does not participate in regular exercise at present. She generally feels well. She reports sleeping poorly. She does have additional problems to discuss today.  HPI   LLE swelling X 9 months Knee pain and getting injection from Ortho Varicose veins Comes and goes Worse at the end of the day Compression socks are unco   Past Medical History:  Diagnosis Date  . BRCA gene mutation negative 07/10/2016   MyRisk neg  . Breast cancer ([place]) 2009   T1c (1.2 cm) N0 ER 90%, PR 90%, HER-2/neu not amplified. Oncotype DX score, low risk (14) 9% chance of recurrence with antiestrogen therapy alone.  . Breast cancer ([place]) 06-17-16   INVASIVE MAMMARY CARCINOMA . T1, ER positive, PR positive, HER-2/neu not overexpressed  . Diffuse large B-cell lymphoma ([place]) 11/2017   Right cervical adenopathy  . Family history of adverse reaction to anesthesia    PTS MOM-HARD TO WAKE UP  . Heart murmur   . History of basal cell carcinoma 02/11/2011   right upper back  . History of dysplastic nevus 06/20/2011   left scapula  . Personal history of chemotherapy 2019   lymphoma  . Personal history of radiation therapy 2009   right breast cancer  . Personal history of radiation therapy 2017   right breast cancer  . Radiation 2009   BREAST CA  . Skin cancer    Past Surgical History:  Procedure Laterality Date  . APPENDECTOMY    . BREAST BIOPSY Right 06/17/2016   invasive. T1, ER positive, PR positive, HER-2/neu not overexpressed  . BREAST CYST ASPIRATION Left 2002  . BREAST EXCISIONAL BIOPSY Right 06/2016   lumpectomy  with rad   . BREAST EXCISIONAL BIOPSY Right 2009   breast ca + mammo site radiation  . BREAST LUMPECTOMY Right 2017   invasive mammary NEG margins  . BREAST LUMPECTOMY Right 2009   mammosite  . BREAST LUMPECTOMY WITH NEEDLE LOCALIZATION Right 07/18/2016   Procedure: BREAST LUMPECTOMY WITH NEEDLE LOCALIZATION AND RIGHT AXILLARY EXPLORATION;  Surgeon: Robert Bellow, MD;  Location: [facility] ORS;  Service: General;  Laterality: Right;  . BREAST SURGERY Right July 2009   Wide excision, sentinel left node biopsy MammoSite. ( 06/21/2008  . CHOLECYSTECTOMY  1984  . COLONOSCOPY WITH PROPOFOL N/A 10/13/2018   Procedure: COLONOSCOPY WITH PROPOFOL;  Surgeon: Lucilla Lame, MD;  Location: [facility]COPY;  Service: Endoscopy;  Laterality: N/A;  . DILATION AND CURETTAGE OF UTERUS  2004  . ENDOMETRIAL ABLATION  2005  . IR FLUORO GUIDE PORT INSERTION RIGHT  01/15/2018  . TUMOR REMOVAL  12/2013   from uterus done by [facility]   Social History   Socioeconomic History  . Marital status: Married    Spouse name: [name]  . Number of children: 2  . Years of education: 90  . Highest education level: Not on file  Occupational History  . Occupation: unemployed  Tobacco Use  . Smoking status: Never Smoker  . Smokeless tobacco: Never Used  Vaping Use  . Vaping Use: Never used  Substance  and Sexual Activity  . Alcohol use: No  . Drug use: No  . Sexual activity: Yes    Birth control/protection: Post-menopausal  Other Topics Concern  . Not on file  Social History Narrative  . Not on file   Social Determinants of Health   Financial Resource Strain:   . Difficulty of Paying Living Expenses: Not on file  Food Insecurity:   . Worried About Charity fundraiser in the Last Year: Not on file  . Ran Out of Food in the Last Year: Not on file  Transportation Needs:   . Lack of Transportation (Medical): Not on file  . Lack of Transportation (Non-Medical): Not on file  Physical Activity:   . Days of  Exercise per Week: Not on file  . Minutes of Exercise per Session: Not on file  Stress:   . Feeling of Stress : Not on file  Social Connections:   . Frequency of Communication with Friends and Family: Not on file  . Frequency of Social Gatherings with Friends and Family: Not on file  . Attends Religious Services: Not on file  . Active Member of Clubs or Organizations: Not on file  . Attends Archivist Meetings: Not on file  . Marital Status: Not on file  Intimate Partner Violence:   . Fear of Current or Ex-Partner: Not on file  . Emotionally Abused: Not on file  . Physically Abused: Not on file  . Sexually Abused: Not on file   Family Status  Relation Name Status  . Mother  Alive  . Father  Deceased       CA  . Brother 1 Alive  . MGM  Deceased at age 34       CA  . PGM  Deceased  . PGF  Deceased  . Brother 2 Alive  . Brother 3 Alive  . Brother 4 Alive  . MGF  Deceased at age 63       old age  . Other Maternal Janett Labella (Not Specified)  . Neg Hx  (Not Specified)   Family History  Problem Relation Age of Onset  . CAD Mother   . Osteoporosis Mother   . Lung cancer Father   . Diabetes Father   . Hypertension Brother   . Ovarian cancer Maternal Grandmother   . Heart attack Paternal Grandmother   . Diabetes Paternal Grandfather   . Fibromyalgia Brother   . Hypertension Brother   . Hypertension Brother   . Sleep apnea Brother   . Healthy Brother   . Sleep apnea Brother   . Ovarian cancer Other   . Breast cancer Neg Hx    No Known Allergies  Patient Care Team: Virginia Crews, MD as PCP - General (Family Medicine) Margarita Rana, MD as Referring Physician (Family Medicine) Bary Castilla, Forest Gleason, MD (General Surgery) Sindy Guadeloupe, MD as Consulting Physician (Oncology) Noreene Filbert, MD as Referring Physician (Radiation Oncology)   Medications: Outpatient Medications Prior to Visit  Medication Sig  . anastrozole (ARIMIDEX) 1 MG tablet TAKE  ONE TABLET EVERY DAY  . CALCIUM-MAGNESIUM-VITAMIN D ER PO Take 1 tablet by mouth daily.  . cetirizine (ZYRTEC) 10 MG tablet Take 10 mg daily by mouth.  . fluticasone (FLONASE) 50 MCG/ACT nasal spray Place 2 sprays into both nostrils daily.  . Multiple Vitamin (MULTIVITAMIN) capsule Take 1 capsule by mouth daily.  . Omega-3 Krill Oil 500 MG CAPS Take 1,000 mg by mouth daily.  No facility-administered medications prior to visit.    Review of Systems  Constitutional: Negative.   HENT: Positive for hearing loss and tinnitus.   Eyes: Negative.   Respiratory: Negative.   Cardiovascular: Positive for leg swelling.  Gastrointestinal: Negative.   Endocrine: Negative.   Genitourinary: Negative.   Musculoskeletal: Positive for arthralgias.  Skin: Negative.   Allergic/Immunologic: Negative.   Neurological: Negative.   Hematological: Negative.   Psychiatric/Behavioral: Negative.     {Heme  Chem  Endocrine  Serology  Results Review (optional):[id number]::" "}  Objective    BP (!) 101/59 (BP Location: Left Arm, Patient Position: Sitting, Cuff Size: Normal)   Pulse 87   Temp 98.3 F (36.8 C) (Oral)   Ht 5' 2"  (1.575 m)   Wt 187 lb (84.8 kg)   SpO2 97%   BMI 34.20 kg/m    Physical Exam Constitutional:      Appearance: Normal appearance. She is normal weight.  HENT:     Head: Normocephalic and atraumatic.     Right Ear: Tympanic membrane, ear canal and external ear normal.     Left Ear: Tympanic membrane, ear canal and external ear normal.  Eyes:     Extraocular Movements: Extraocular movements intact.     Conjunctiva/sclera: Conjunctivae normal.     Pupils: Pupils are equal, round, and reactive to light.  Cardiovascular:     Rate and Rhythm: Normal rate and regular rhythm.     Pulses: Normal pulses.     Heart sounds: Normal heart sounds. No murmur heard.   Pulmonary:     Effort: Pulmonary effort is normal. No respiratory distress.     Breath sounds: Normal breath sounds. No  wheezing or rhonchi.  Abdominal:     General: There is no distension.     Palpations: Abdomen is soft.     Tenderness: There is no abdominal tenderness.  Musculoskeletal:        General: Normal range of motion.     Cervical back: Normal range of motion and neck supple.     Right lower leg: No edema.     Left lower leg: Edema (with varicose veins) present.  Skin:    General: Skin is warm and dry.     Findings: No rash.  Neurological:     General: No focal deficit present.     Mental Status: She is alert and oriented to person, place, and time. Mental status is at baseline.     Gait: Gait normal.  Psychiatric:        Mood and Affect: Mood normal.        Behavior: Behavior normal.        Thought Content: Thought content normal.     Last depression screening scores PHQ 2/9 Scores 09/29/2020 09/28/2019 09/25/2018  PHQ - 2 Score 0 0 0  PHQ- 9 Score 0 - 0   Last fall risk screening Fall Risk  09/29/2020  Falls in the past year? 0  Number falls in past yr: -  Injury with Fall? 0  Follow up -   Last Audit-C alcohol use screening Alcohol Use Disorder Test (AUDIT) 09/29/2020  1. How often do you have a drink containing alcohol? 1  2. How many drinks containing alcohol do you have on a typical day when you are drinking? 0  3. How often do you have six or more drinks on one occasion? 0  AUDIT-C Score 1  Alcohol Brief Interventions/Follow-up AUDIT Score <7 follow-up not indicated  A score of 3 or more in women, and 4 or more in men indicates increased risk for alcohol abuse, EXCEPT if all of the points are from question 1   Results for orders placed or performed in visit on 09/29/20  POCT urinalysis dipstick  Result Value Ref Range   Color, UA light yellow    Clarity, UA clear    Glucose, UA Negative Negative   Bilirubin, UA neg    Ketones, UA neg    Spec Grav, UA 1.010 1.010 - 1.025   Blood, UA neg    pH, UA 6.0 5.0 - 8.0   Protein, UA Negative Negative   Urobilinogen, UA 0.2  0.2 or 1.0 E.U./dL   Nitrite, UA neg    Leukocytes, UA Negative Negative   Appearance     Odor      Assessment & Plan    Routine Health Maintenance and Physical Exam  Exercise Activities and Dietary recommendations Goals   None     Immunization History  Administered Date(s) Administered  . Influenza Split 09/10/2011, 12/11/2012  . Influenza,inj,Quad PF,6+ Mos 09/25/2018, 09/28/2019, 09/29/2020  . Influenza,inj,quad, With Preservative 12/19/2017  . Influenza-Unspecified 12/04/2015  . Moderna SARS-COVID-2 Vaccination 03/02/2020, 03/30/2020  . Td 07/10/1999, 09/28/2019  . Tdap 04/12/2009  . Zoster Recombinat (Shingrix) 09/28/2019, 11/30/2019    Health Maintenance  Topic Date Due  . PAP SMEAR-Modifier  03/21/2022  . MAMMOGRAM  08/03/2022  . COLONOSCOPY  10/13/2028  . TETANUS/TDAP  09/27/2029  . INFLUENZA VACCINE  Completed  . COVID-19 Vaccine  Completed  . Hepatitis C Screening  Completed  . HIV Screening  Completed    Discussed health benefits of physical activity, and encouraged her to engage in regular exercise appropriate for her age and condition.  Problem List Items Addressed This Visit      Cardiovascular and Mediastinum   Varicose veins of left lower extremity with pain    Ongoing and worsening problem Likely cause of her edema Encourage compression stockings Referral to [facility] for possible intervention       Relevant Orders   US Venous Img Lower Unilateral Left   Ambulatory referral to Vascular Surgery     Endocrine   Subclinical hypothyroidism    Recheck TSH      Relevant Orders   TSH     Other   HLD (hyperlipidemia)    Reviewed last lipid panel Not currently on a statin Recheck FLP and CMP Discussed diet and exercise       Relevant Orders   Lipid Panel With LDL/HDL Ratio   Avitaminosis D    Recheck Vit D level      Relevant Orders   VITAMIN D 25 Hydroxy (Vit-D Deficiency, Fractures)   Malignant neoplasm of upper-inner quadrant of  right breast in female, estrogen receptor positive (HCC)    Followed by Onc and Surgery Continue annual mammogram On Arimidex for total of 5 yrs      DLBCL (diffuse large B cell lymphoma) (HCC)    Followed by Onc Doing well with good labs recently      Class 1 obesity without serious comorbidity with body mass index (BMI) of 33.0 to 33.9 in adult    Discussed importance of healthy weight management Discussed diet and exercise       Edema of left lower extremity    Likely related to varicose veins, but given asymmetry with RLE, will check Venous duplex to r/o DVT See plan above re: compression stockings, elevation, [facility]VS  referral      Relevant Orders   US Venous Img Lower Unilateral Left   Ambulatory referral to Vascular Surgery   Malodorous urine    UA clear Reassurance given      Relevant Orders   POCT urinalysis dipstick (Completed)    Other Visit Diagnoses    Encounter for annual physical exam    -  Primary   Relevant Orders   Lipid Panel With LDL/HDL Ratio   TSH   VITAMIN D 25 Hydroxy (Vit-D Deficiency, Fractures)   Need for influenza vaccination       Relevant Orders   Flu Vaccine QUAD 6+ mos PF IM (Fluarix Quad PF) (Completed)       Return in about 1 year (around 09/29/2021) for CPE.     I, Lavon Paganini, MD, have reviewed all documentation for this visit. The documentation on 09/29/20 for the exam, diagnosis, procedures, and orders are all accurate and complete.   Victoria Benson, Dionne Bucy, MD, MPH [facility]

## 2020-09-29 NOTE — Assessment & Plan Note (Signed)
Ongoing and worsening problem Likely cause of her edema Encourage compression stockings Referral to [facility] for possible intervention

## 2020-09-29 NOTE — Assessment & Plan Note (Signed)
Discussed importance of healthy weight management Discussed diet and exercise  

## 2020-09-29 NOTE — Assessment & Plan Note (Signed)
Recheck TSH 

## 2020-09-29 NOTE — Assessment & Plan Note (Signed)
Recheck Vit D level 

## 2020-09-29 NOTE — Assessment & Plan Note (Signed)
Followed by Onc and Surgery Continue annual mammogram On Arimidex for total of 5 yrs

## 2020-09-29 NOTE — Assessment & Plan Note (Signed)
Followed by Onc Doing well with good labs recently

## 2020-09-29 NOTE — Assessment & Plan Note (Signed)
Likely related to varicose veins, but given asymmetry with RLE, will check Venous duplex to r/o DVT See plan above re: compression stockings, elevation, VVS referral

## 2020-09-29 NOTE — Patient Instructions (Signed)

## 2020-09-29 NOTE — Assessment & Plan Note (Signed)
Reviewed last lipid panel Not currently on a statin Recheck FLP and CMP Discussed diet and exercise  

## 2020-09-30 LAB — LIPID PANEL WITH LDL/HDL RATIO
Cholesterol, Total: 215 mg/dL — ABNORMAL HIGH (ref 100–199)
HDL: 49 mg/dL (ref >39)
LDL Chol Calc (NIH): 128 mg/dL — ABNORMAL HIGH (ref 0–99)
LDL/HDL Ratio: 2.6 ratio (ref 0.0–3.2)
Triglycerides: 217 mg/dL — ABNORMAL HIGH (ref 0–149)
VLDL Cholesterol Cal: 38 mg/dL (ref 5–40)

## 2020-09-30 LAB — VITAMIN D 25 HYDROXY (VIT D DEFICIENCY, FRACTURES): Vit D, 25-Hydroxy: 42.5 ng/mL (ref 30.0–100.0)

## 2020-09-30 LAB — TSH: TSH: 4.31 u[IU]/mL (ref 0.450–4.500)

## 2020-10-03 ENCOUNTER — Telehealth: Payer: Self-pay

## 2020-10-03 NOTE — Telephone Encounter (Signed)
-----   Message from Mar Daring, PA-C sent at 10/02/2020  7:38 PM EDT ----- Kermit Balo Evening,  I am checking labs for Dr. Brita Romp as she is out of town.  Your cholesterol has increased compared to last year. Continue working on healthy lifestyle modifications limiting fatty foods, red meats, processed foods, and sugars. Thyroid is normal. Vit D is normal.

## 2020-10-03 NOTE — Telephone Encounter (Signed)
Written by Mar Daring, PA-C on 10/02/2020 7:38 PM EDT View Full Comments Seen by patient Quay Burow on 10/02/2020 9:05 PM

## 2020-10-05 ENCOUNTER — Ambulatory Visit
Admission: RE | Admit: 2020-10-05 | Discharge: 2020-10-05 | Disposition: A | Discharge status: Home / Self Care | Payer: 59 | Source: Ambulatory Visit | Attending: Family Medicine | Admitting: Family Medicine

## 2020-10-05 ENCOUNTER — Other Ambulatory Visit: Payer: Self-pay

## 2020-10-05 DIAGNOSIS — R600 Localized edema: Secondary | ICD-10-CM | POA: Insufficient documentation

## 2020-10-05 DIAGNOSIS — I83812 Varicose veins of left lower extremities with pain: Secondary | ICD-10-CM | POA: Diagnosis present

## 2020-10-06 ENCOUNTER — Telehealth: Payer: Self-pay

## 2020-10-06 NOTE — Telephone Encounter (Signed)
Written by Mar Daring, PA-C on 10/06/2020 10:20 AM EDT Seen by patient Victoria Benson on 10/06/2020 11:53 AM

## 2020-10-06 NOTE — Telephone Encounter (Signed)
-----   Message from Mar Daring, PA-C sent at 10/06/2020 10:20 AM EDT ----- No DVT. There is a cyst in the left popliteal (space behind knee) space. This is associated with arthritis. Can f/u with Dr. B if having left knee pain/swelling.

## 2020-11-06 ENCOUNTER — Encounter (INDEPENDENT_AMBULATORY_CARE_PROVIDER_SITE_OTHER): Payer: 59 | Admitting: Vascular Surgery

## 2020-11-20 ENCOUNTER — Encounter (INDEPENDENT_AMBULATORY_CARE_PROVIDER_SITE_OTHER): Payer: Self-pay | Admitting: Vascular Surgery

## 2020-11-20 ENCOUNTER — Other Ambulatory Visit: Payer: Self-pay

## 2020-11-20 ENCOUNTER — Ambulatory Visit (INDEPENDENT_AMBULATORY_CARE_PROVIDER_SITE_OTHER): Payer: 59 | Admitting: Vascular Surgery

## 2020-11-20 VITALS — BP 113/62 | HR 77 | Resp 16 | Wt 182.6 lb

## 2020-11-20 DIAGNOSIS — I83813 Varicose veins of bilateral lower extremities with pain: Secondary | ICD-10-CM | POA: Diagnosis not present

## 2020-11-20 DIAGNOSIS — E782 Mixed hyperlipidemia: Secondary | ICD-10-CM

## 2020-11-20 DIAGNOSIS — I872 Venous insufficiency (chronic) (peripheral): Secondary | ICD-10-CM | POA: Diagnosis not present

## 2020-11-20 NOTE — Progress Notes (Signed)
MRN : [id number]  Victoria Benson is a 64 y.o. ([birth year]) female who presents with chief complaint of No chief complaint on file. Marland Kitchen  History of Present Illness:   The patient is seen for evaluation of symptomatic varicose veins. The patient relates burning and stinging which worsened steadily throughout the course of the day, particularly with standing. The patient also notes an aching and throbbing pain over the varicosities, particularly with prolonged dependent positions. The symptoms are significantly improved with elevation.  The patient also notes that during hot weather the symptoms are greatly intensified. The patient states the pain from the varicose veins interferes with work, daily exercise, shopping and household maintenance. At this point, the symptoms are persistent and severe enough that they're having a negative impact on lifestyle and are interfering with daily activities.  There is no history of DVT, PE or superficial thrombophlebitis. There is no history of ulceration or hemorrhage. The patient denies a significant family history of varicose veins.  The patient has not worn graduated compression on a daily basis. At the present time the patient has not been using over-the-counter analgesics. There is no history of prior surgical intervention or sclerotherapy.    No outpatient medications have been marked as taking for the 11/20/20 encounter (Appointment) with Delana Meyer, Dolores Lory, MD.    Past Medical History:  Diagnosis Date  . BRCA gene mutation negative 07/10/2016   MyRisk neg  . Breast cancer ([place]) 2009   T1c (1.2 cm) N0 ER 90%, PR 90%, HER-2/neu not amplified. Oncotype DX score, low risk (14) 9% chance of recurrence with antiestrogen therapy alone.  . Breast cancer ([place]) 06-17-16   INVASIVE MAMMARY CARCINOMA . T1, ER positive, PR positive, HER-2/neu not overexpressed  . Diffuse large B-cell lymphoma ([place]) 11/2017   Right cervical adenopathy  . Family history of  adverse reaction to anesthesia    PTS MOM-HARD TO WAKE UP  . Heart murmur   . History of basal cell carcinoma 02/11/2011   right upper back  . History of dysplastic nevus 06/20/2011   left scapula  . Personal history of chemotherapy 2019   lymphoma  . Personal history of radiation therapy 2009   right breast cancer  . Personal history of radiation therapy 2017   right breast cancer  . Radiation 2009   BREAST CA  . Skin cancer     Past Surgical History:  Procedure Laterality Date  . APPENDECTOMY    . BREAST BIOPSY Right 06/17/2016   invasive. T1, ER positive, PR positive, HER-2/neu not overexpressed  . BREAST CYST ASPIRATION Left 2002  . BREAST EXCISIONAL BIOPSY Right 06/2016   lumpectomy with rad   . BREAST EXCISIONAL BIOPSY Right 2009   breast ca + mammo site radiation  . BREAST LUMPECTOMY Right 2017   invasive mammary NEG margins  . BREAST LUMPECTOMY Right 2009   mammosite  . BREAST LUMPECTOMY WITH NEEDLE LOCALIZATION Right 07/18/2016   Procedure: BREAST LUMPECTOMY WITH NEEDLE LOCALIZATION AND RIGHT AXILLARY EXPLORATION;  Surgeon: Robert Bellow, MD;  Location: [facility] ORS;  Service: General;  Laterality: Right;  . BREAST SURGERY Right July 2009   Wide excision, sentinel left node biopsy MammoSite. ( 06/21/2008  . CHOLECYSTECTOMY  1984  . COLONOSCOPY WITH PROPOFOL N/A 10/13/2018   Procedure: COLONOSCOPY WITH PROPOFOL;  Surgeon: Lucilla Lame, MD;  Location: [facility] ENDOSCOPY;  Service: Endoscopy;  Laterality: N/A;  . DILATION AND CURETTAGE OF UTERUS  2004  . ENDOMETRIAL ABLATION  2005  .  IR FLUORO GUIDE PORT INSERTION RIGHT  01/15/2018  . TUMOR REMOVAL  12/2013   from uterus done by [facility]    Social History Social History   Tobacco Use  . Smoking status: Never Smoker  . Smokeless tobacco: Never Used  Vaping Use  . Vaping Use: Never used  Substance Use Topics  . Alcohol use: No  . Drug use: No    Family History Family History  Problem Relation Age of Onset    . CAD Mother   . Osteoporosis Mother   . Lung cancer Father   . Diabetes Father   . Hypertension Brother   . Ovarian cancer Maternal Grandmother   . Heart attack Paternal Grandmother   . Diabetes Paternal Grandfather   . Fibromyalgia Brother   . Hypertension Brother   . Hypertension Brother   . Sleep apnea Brother   . Healthy Brother   . Sleep apnea Brother   . Ovarian cancer Other   . Breast cancer Neg Hx   No family history of bleeding/clotting disorders, porphyria or autoimmune disease   No Known Allergies   REVIEW OF SYSTEMS (Negative unless checked)  Constitutional: _0 Weight loss  _1 Fever  _2 Chills Cardiac: _3 Chest pain   _4 Chest pressure   _5 Palpitations   _6 Shortness of breath when laying flat   _7 Shortness of breath with exertion. Vascular:  _8 Pain in legs with walking   _9 Pain in legs at rest  _10 History of DVT   _11 Phlebitis   _12 Swelling in legs   _13 Varicose veins   _14 Non-healing ulcers Pulmonary:   _15 Uses home oxygen   _16 Productive cough   _17 Hemoptysis   _18 Wheeze  _19 COPD   _20 Asthma Neurologic:  _21 Dizziness   _22 Seizures   _23 History of stroke   _24 History of TIA  _25 Aphasia   _26 Vissual changes   _27 Weakness or numbness in arm   _28 Weakness or numbness in leg Musculoskeletal:   _29 Joint swelling   _30 Joint pain   _31 Low back pain Hematologic:  _32 Easy bruising  _33 Easy bleeding   _34 Hypercoagulable state   _35 Anemic Gastrointestinal:  _36 Diarrhea   _37 Vomiting  _38 Gastroesophageal reflux/heartburn   _39 Difficulty swallowing. Genitourinary:  _40 Chronic kidney disease   _41 Difficult urination  _42 Frequent urination   _43 Blood in urine Skin:  _44 Rashes   _45 Ulcers  Psychological:  _46 History of anxiety   _47  History of major depression.  Physical Examination  There were no vitals filed for this visit. There is no height or weight on file to calculate BMI. Gen: WD/WN, NAD Head: [place]/AT, No temporalis wasting.  Ear/Nose/Throat: Hearing grossly intact, nares w/o erythema or drainage, poor  dentition Eyes: PER, EOMI, sclera nonicteric.  Neck: Supple, no masses.  No bruit or JVD.  Pulmonary:  Good air movement, clear to auscultation bilaterally, no use of accessory muscles.  Cardiac: RRR, normal S1, S2, no Murmurs. Vascular: scattered large varicosities present bilaterally.  Moderate venous stasis changes to the legs bilaterally.  2-3+ soft pitting edema Vessel Right Left  Radial Palpable Palpable  PT Palpable Palpable  DP Palpable Palpable  Gastrointestinal: soft, non-distended. No guarding/no peritoneal signs.  Musculoskeletal: M/S 5/5 throughout.  No deformity or atrophy.  Neurologic: CN 2-12 intact. Pain and light touch intact in extremities.  Symmetrical.  Speech is fluent. Motor exam as listed above. Psychiatric: Judgment intact, Mood & affect appropriate for pt's clinical situation. Dermatologic: Moderate venous rashes no ulcers noted.  No changes consistent with cellulitis. Lymph : + lichenification and skin changes of chronic lymphedema.  CBC Lab Results  Component Value Date  WBC 5.6 08/25/2020   HGB 14.3 08/25/2020   HCT 41.7 08/25/2020   MCV 87.2 08/25/2020   PLT 166 08/25/2020    BMET    Component Value Date/Time   NA 141 08/25/2020 0942   NA 141 03/25/2017 0848   K 4.4 08/25/2020 0942   CL 102 08/25/2020 0942   CO2 30 08/25/2020 0942   GLUCOSE 111 (H) 08/25/2020 0942   BUN 11 08/25/2020 0942   BUN 9 03/25/2017 0848   CREATININE 0.70 08/25/2020 0942   CREATININE 0.80 09/06/2013 1029   CALCIUM 9.8 08/25/2020 0942   GFRNONAA >60 08/25/2020 0942   GFRNONAA >60 09/06/2013 1029   GFRAA >60 08/25/2020 0942   GFRAA >60 09/06/2013 1029   CrCl cannot be calculated (Patient's most recent lab result is older than the maximum 21 days allowed.).  COAG Lab Results  Component Value Date   INR 1.02 01/15/2018   INR 1.01 12/29/2017    Radiology No results found.   Assessment/Plan 1. Varicose veins of both lower extremities with pain   Recommend:  The patient has large symptomatic varicose veins that are painful and associated with swelling.  I have had a long discussion with the patient regarding  varicose veins and why they cause symptoms.  Patient will begin wearing graduated compression stockings class 1 on a daily basis, beginning first thing in the morning and removing them in the evening. The patient is instructed specifically not to sleep in the stockings.    The patient  will also begin using over-the-counter analgesics such as Motrin 600 mg po TID to help control the symptoms.    In addition, behavioral modification including elevation during the day will be initiated.    Pending the results of these changes the  patient will be reevaluated in about 1 month.   An  ultrasound of the venous system will be obtained.   Further plans will be based on the ultrasound results and whether conservative therapies are successful at eliminating the pain and swelling.  - VAS [place] LOWER EXTREMITY VENOUS REFLUX; Future  2. Chronic venous insufficiency No surgery or intervention at this point in time.    I have had a long discussion with the patient regarding venous insufficiency and why it  causes symptoms. I have discussed with the patient the chronic skin changes that accompany venous insufficiency and the long term sequela such as infection and ulceration.  Patient will begin wearing graduated compression stockings class 1 (20-30 mmHg) or compression wraps on a daily basis a prescription was given. The patient will put the stockings on first thing in the morning and removing them in the evening. The patient is instructed specifically not to sleep in the stockings.    In addition, behavioral modification including several periods of elevation of the lower extremities during the day will be continued. I have demonstrated that proper elevation is a position with the ankles at heart level.  The patient is instructed to begin routine  exercise, especially walking on a daily basis  Patient should undergo duplex ultrasound of the venous system to ensure that DVT or reflux is not present.  Following the review of the ultrasound the patient will follow up in 1 month to reassess the degree of swelling and the control that graduated compression stockings or compression wraps  is offering.   The patient can be assessed for a Lymph Pump at that time - VAS [place] LOWER EXTREMITY VENOUS REFLUX; Future  3. Mixed hyperlipidemia Continue  statin as ordered and reviewed, no changes at this time     Hortencia Pilar, MD  11/20/2020 12:46 PM

## 2020-11-22 ENCOUNTER — Encounter (INDEPENDENT_AMBULATORY_CARE_PROVIDER_SITE_OTHER): Payer: Self-pay | Admitting: Vascular Surgery

## 2020-12-08 ENCOUNTER — Ambulatory Visit (INDEPENDENT_AMBULATORY_CARE_PROVIDER_SITE_OTHER): Payer: 59

## 2020-12-08 ENCOUNTER — Ambulatory Visit (INDEPENDENT_AMBULATORY_CARE_PROVIDER_SITE_OTHER): Payer: 59 | Admitting: Nurse Practitioner

## 2020-12-08 ENCOUNTER — Encounter (INDEPENDENT_AMBULATORY_CARE_PROVIDER_SITE_OTHER): Payer: Self-pay

## 2020-12-08 ENCOUNTER — Other Ambulatory Visit: Payer: Self-pay

## 2020-12-08 DIAGNOSIS — I872 Venous insufficiency (chronic) (peripheral): Secondary | ICD-10-CM

## 2020-12-08 DIAGNOSIS — I83813 Varicose veins of bilateral lower extremities with pain: Secondary | ICD-10-CM | POA: Diagnosis not present

## 2021-01-08 ENCOUNTER — Telehealth (INDEPENDENT_AMBULATORY_CARE_PROVIDER_SITE_OTHER): Payer: Self-pay | Admitting: Nurse Practitioner

## 2021-01-08 ENCOUNTER — Other Ambulatory Visit (INDEPENDENT_AMBULATORY_CARE_PROVIDER_SITE_OTHER): Payer: Self-pay | Admitting: Nurse Practitioner

## 2021-01-08 NOTE — Telephone Encounter (Signed)
I contacted the patient directly and discussed her ultrasound results from 12/08/2020.  The patient has no evidence of DVT or superficial venous thrombosis of the left lower extremity.  There is no evidence of deep venous insufficiency or superficial venous reflux seen either.  There was a 4.5 cm x 3.2 sick centimeter by 1.45 cm Baker cyst.  The patient notes that she had recent gel injections and that the pain behind her knee is gone however the swelling still persist.  The patient has a previous history of lymphoma.  Patient is advised to try to utilize medical grade 1 compression, elevation as well as activity to see if this alleviates some of the symptoms of her swelling.  If not, a lymphedema pump may be a good adjunct therapy.  Patient is advised to contact our office if swelling does not show evidence of improvement.

## 2021-02-19 ENCOUNTER — Telehealth: Payer: Self-pay | Admitting: Oncology

## 2021-02-19 NOTE — Telephone Encounter (Signed)
02/19/2021 Left VM informing pt that appts on 2/28 have been moved to 3/21 due to MD being on PAL. Left callback # in case there were any questions or issues w/ the day/time SRW

## 2021-02-26 ENCOUNTER — Other Ambulatory Visit: Payer: 59

## 2021-02-26 ENCOUNTER — Ambulatory Visit: Payer: 59 | Admitting: Oncology

## 2021-03-19 ENCOUNTER — Inpatient Hospital Stay (HOSPITAL_BASED_OUTPATIENT_CLINIC_OR_DEPARTMENT_OTHER): Payer: 59 | Admitting: Oncology

## 2021-03-19 ENCOUNTER — Inpatient Hospital Stay: Payer: 59 | Attending: Oncology

## 2021-03-19 ENCOUNTER — Other Ambulatory Visit: Payer: Self-pay

## 2021-03-19 ENCOUNTER — Encounter: Payer: Self-pay | Admitting: Oncology

## 2021-03-19 VITALS — BP 109/75 | HR 84 | Temp 97.5°F | Resp 20 | Wt 176.0 lb

## 2021-03-19 DIAGNOSIS — Z853 Personal history of malignant neoplasm of breast: Secondary | ICD-10-CM | POA: Insufficient documentation

## 2021-03-19 DIAGNOSIS — Z923 Personal history of irradiation: Secondary | ICD-10-CM | POA: Diagnosis not present

## 2021-03-19 DIAGNOSIS — C8336 Diffuse large B-cell lymphoma, intrapelvic lymph nodes: Secondary | ICD-10-CM | POA: Insufficient documentation

## 2021-03-19 DIAGNOSIS — Z801 Family history of malignant neoplasm of trachea, bronchus and lung: Secondary | ICD-10-CM | POA: Diagnosis not present

## 2021-03-19 DIAGNOSIS — Z85828 Personal history of other malignant neoplasm of skin: Secondary | ICD-10-CM | POA: Insufficient documentation

## 2021-03-19 DIAGNOSIS — Z08 Encounter for follow-up examination after completed treatment for malignant neoplasm: Secondary | ICD-10-CM | POA: Diagnosis not present

## 2021-03-19 DIAGNOSIS — Z8579 Personal history of other malignant neoplasms of lymphoid, hematopoietic and related tissues: Secondary | ICD-10-CM

## 2021-03-19 LAB — CBC WITH DIFFERENTIAL/PLATELET
Abs Immature Granulocytes: 0.02 10*3/uL (ref 0.00–0.07)
Basophils Absolute: 0 10*3/uL (ref 0.0–0.1)
Basophils Relative: 1 %
Eosinophils Absolute: 0.1 10*3/uL (ref 0.0–0.5)
Eosinophils Relative: 2 %
HCT: 44.6 % (ref 36.0–46.0)
Hemoglobin: 14.6 g/dL (ref 12.0–15.0)
Immature Granulocytes: 0 %
Lymphocytes Relative: 22 %
Lymphs Abs: 1.1 10*3/uL (ref 0.7–4.0)
MCH: 29.3 pg (ref 26.0–34.0)
MCHC: 32.7 g/dL (ref 30.0–36.0)
MCV: 89.6 fL (ref 80.0–100.0)
Monocytes Absolute: 0.4 10*3/uL (ref 0.1–1.0)
Monocytes Relative: 9 %
Neutro Abs: 3.4 10*3/uL (ref 1.7–7.7)
Neutrophils Relative %: 66 %
Platelets: 163 10*3/uL (ref 150–400)
RBC: 4.98 MIL/uL (ref 3.87–5.11)
RDW: 12.7 % (ref 11.5–15.5)
WBC: 5.1 10*3/uL (ref 4.0–10.5)
nRBC: 0 % (ref 0.0–0.2)

## 2021-03-19 LAB — COMPREHENSIVE METABOLIC PANEL
ALT: 28 U/L (ref 0–44)
AST: 19 U/L (ref 15–41)
Albumin: 4.2 g/dL (ref 3.5–5.0)
Alkaline Phosphatase: 78 U/L (ref 38–126)
Anion gap: 10 (ref 5–15)
BUN: 12 mg/dL (ref 8–23)
CO2: 28 mmol/L (ref 22–32)
Calcium: 9.2 mg/dL (ref 8.9–10.3)
Chloride: 101 mmol/L (ref 98–111)
Creatinine, Ser: 0.67 mg/dL (ref 0.44–1.00)
GFR, Estimated: >60 mL/min (ref >60)
Glucose, Bld: 94 mg/dL (ref 70–99)
Potassium: 4.2 mmol/L (ref 3.5–5.1)
Sodium: 139 mmol/L (ref 135–145)
Total Bilirubin: 1 mg/dL (ref 0.3–1.2)
Total Protein: 7 g/dL (ref 6.5–8.1)

## 2021-03-19 LAB — LACTATE DEHYDROGENASE: LDH: 115 U/L (ref 98–192)

## 2021-03-19 NOTE — Progress Notes (Signed)
Hematology/Oncology Consult note [facility]  [phone] [phone]  Patient Care Team: Virginia Crews, MD as PCP - General (Family Medicine) Margarita Rana, MD as Referring Physician (Family Medicine) Bary Castilla, Forest Gleason, MD (General Surgery) Sindy Guadeloupe, MD as Consulting Physician (Oncology) Noreene Filbert, MD as Referring Physician (Radiation Oncology)   Name of the patient: Victoria Benson  [id number]  1956/07/22   Date of visit: 03/19/21  Diagnosis- stage I diffuse large B-cell lymphoma in CR 1  Chief complaint/ Reason for visit-routine follow-up of diffuse large B-cell lymphoma  Heme/Onc history: patient is a 65 year old female who developed symptoms of upper respiratory infection and nasal congestion sometime in November 2018. Around the same time she also developed a right neck mass which was initially attributed to possible tonsillitis and she was given antibiotics for the same. However her right neck mass did not decrease in size and she eventually saw Dr. Pryor Ochoa from ENT. Patient was found to have significant right cervical adenopathy and underwent CT of the soft tissue neck which showed right level 2 and 3 adenopathy favoring metastatic carcinoma. Jugulodigastric node measuring up to 3.7 cm. Asymmetric fullness of the right tonsillar fossa possibly primary site.  Patient underwent core biopsy of the right cervical lymph node.Pathology showed diffuse large B-cell lymphoma of the ABC subtype. IHC was positive for BCL-2 and BCL 6 see Mick IHC was not tested due to insufficiency of the testing sample. Ki-67 was high 80-90%.  PET/CT scan showed hypermetabolismin the tonsillar fossa bilaterally right greater than left as well as hypermetabolic lymphadenopathy in the right neck at the level 2 and level 3. No evidence of contralateral cervical adenopathy and no evidence of metastatic disease elsewhere  Bone marrow biopsy  showed no evidence of lymphoma. IPI score 0- low risk. Does not meet criteria for CNS prophylaxis  Patient finished 3 cycles of RCHOP on 02/27/18.PET showed no active disease. She then completed IFRTand is in CR1   Interval history-patient is doing well.  Her appetite and weight have remained stable.  She denies any unintentional weight loss or lumps and bumps anywhere.  ECOG PS- 0 Pain scale- 0   Review of systems- Review of Systems  Constitutional: Negative for chills, fever, malaise/fatigue and weight loss.  HENT: Negative for congestion, ear discharge and nosebleeds.   Eyes: Negative for blurred vision.  Respiratory: Negative for cough, hemoptysis, sputum production, shortness of breath and wheezing.   Cardiovascular: Negative for chest pain, palpitations, orthopnea and claudication.  Gastrointestinal: Negative for abdominal pain, blood in stool, constipation, diarrhea, heartburn, melena, nausea and vomiting.  Genitourinary: Negative for dysuria, flank pain, frequency, hematuria and urgency.  Musculoskeletal: Negative for back pain, joint pain and myalgias.  Skin: Negative for rash.  Neurological: Negative for dizziness, tingling, focal weakness, seizures, weakness and headaches.  Endo/Heme/Allergies: Does not bruise/bleed easily.  Psychiatric/Behavioral: Negative for depression and suicidal ideas. The patient does not have insomnia.      No Known Allergies   Past Medical History:  Diagnosis Date  . BRCA gene mutation negative 07/10/2016   MyRisk neg  . Breast cancer ([place]) 2009   T1c (1.2 cm) N0 ER 90%, PR 90%, HER-2/neu not amplified. Oncotype DX score, low risk (14) 9% chance of recurrence with antiestrogen therapy alone.  . Breast cancer ([place]) 06-17-16   INVASIVE MAMMARY CARCINOMA . T1, ER positive, PR positive, HER-2/neu not overexpressed  . Diffuse large B-cell lymphoma ([place]) 11/2017   Right cervical adenopathy  .  Family history of adverse reaction to anesthesia     PTS MOM-HARD TO WAKE UP  . Heart murmur   . History of basal cell carcinoma 02/11/2011   right upper back  . History of dysplastic nevus 06/20/2011   left scapula  . Personal history of chemotherapy 2019   lymphoma  . Personal history of radiation therapy 2009   right breast cancer  . Personal history of radiation therapy 2017   right breast cancer  . Radiation 2009   BREAST CA  . Skin cancer      Past Surgical History:  Procedure Laterality Date  . APPENDECTOMY    . BREAST BIOPSY Right 06/17/2016   invasive. T1, ER positive, PR positive, HER-2/neu not overexpressed  . BREAST CYST ASPIRATION Left 2002  . BREAST EXCISIONAL BIOPSY Right 06/2016   lumpectomy with rad   . BREAST EXCISIONAL BIOPSY Right 2009   breast ca + mammo site radiation  . BREAST LUMPECTOMY Right 2017   invasive mammary NEG margins  . BREAST LUMPECTOMY Right 2009   mammosite  . BREAST LUMPECTOMY WITH NEEDLE LOCALIZATION Right 07/18/2016   Procedure: BREAST LUMPECTOMY WITH NEEDLE LOCALIZATION AND RIGHT AXILLARY EXPLORATION;  Surgeon: Robert Bellow, MD;  Location: [facility] ORS;  Service: General;  Laterality: Right;  . BREAST SURGERY Right July 2009   Wide excision, sentinel left node biopsy MammoSite. ( 06/21/2008  . CHOLECYSTECTOMY  1984  . COLONOSCOPY WITH PROPOFOL N/A 10/13/2018   Procedure: COLONOSCOPY WITH PROPOFOL;  Surgeon: Lucilla Lame, MD;  Location: [facility] ENDOSCOPY;  Service: Endoscopy;  Laterality: N/A;  . DILATION AND CURETTAGE OF UTERUS  2004  . ENDOMETRIAL ABLATION  2005  . IR FLUORO GUIDE PORT INSERTION RIGHT  01/15/2018  . TUMOR REMOVAL  12/2013   from uterus done by [facility]    Social History   Socioeconomic History  . Marital status: Married    Spouse name: [name]  . Number of children: 2  . Years of education: 46  . Highest education level: Not on file  Occupational History  . Occupation: unemployed  Tobacco Use  . Smoking status: Never Smoker  . Smokeless tobacco: Never Used   Vaping Use  . Vaping Use: Never used  Substance and Sexual Activity  . Alcohol use: No  . Drug use: No  . Sexual activity: Yes    Birth control/protection: Post-menopausal  Other Topics Concern  . Not on file  Social History Narrative  . Not on file   Social Determinants of Health   Financial Resource Strain: Not on file  Food Insecurity: Not on file  Transportation Needs: Not on file  Physical Activity: Not on file  Stress: Not on file  Social Connections: Not on file  Intimate Partner Violence: Not on file    Family History  Problem Relation Age of Onset  . CAD Mother   . Osteoporosis Mother   . Lung cancer Father   . Diabetes Father   . Hypertension Brother   . Ovarian cancer Maternal Grandmother   . Heart attack Paternal Grandmother   . Diabetes Paternal Grandfather   . Fibromyalgia Brother   . Hypertension Brother   . Hypertension Brother   . Sleep apnea Brother   . Healthy Brother   . Sleep apnea Brother   . Ovarian cancer Other   . Breast cancer Neg Hx      Current Outpatient Medications:  .  CALCIUM-MAGNESIUM-VITAMIN D ER PO, Take 1 tablet by mouth daily., Disp: ,  Rfl:  .  cetirizine (ZYRTEC) 10 MG tablet, Take 10 mg daily by mouth., Disp: , Rfl:  .  fluticasone (FLONASE) 50 MCG/ACT nasal spray, Place 2 sprays into both nostrils daily., Disp: 16 g, Rfl: 5 .  Multiple Vitamin (MULTIVITAMIN) capsule, Take 1 capsule by mouth daily., Disp: , Rfl:  .  Omega-3 Krill Oil 500 MG CAPS, Take 1,000 mg by mouth daily. , Disp: , Rfl:   Physical exam:  Vitals:   03/19/21 0920  BP: 109/75  Pulse: 84  Resp: 20  Temp: (!) 97.5 F (36.4 C)  TempSrc: Tympanic  Weight: 176 lb (79.8 kg)   Physical Exam Constitutional:      General: She is not in acute distress. Cardiovascular:     Rate and Rhythm: Normal rate and regular rhythm.     Heart sounds: Normal heart sounds.  Pulmonary:     Effort: Pulmonary effort is normal.     Breath sounds: Normal breath sounds.   Abdominal:     General: Bowel sounds are normal.     Palpations: Abdomen is soft.  Lymphadenopathy:     Comments: No palpable cervical, supraclavicular, axillary or inguinal adenopathy   Skin:    General: Skin is warm and dry.  Neurological:     Mental Status: She is alert and oriented to person, place, and time.      CMP Latest Ref Rng & Units 03/19/2021  Glucose 70 - 99 mg/dL 94  BUN 8 - 23 mg/dL 12  Creatinine 0.44 - 1.00 mg/dL 0.67  Sodium 135 - 145 mmol/L 139  Potassium 3.5 - 5.1 mmol/L 4.2  Chloride 98 - 111 mmol/L 101  CO2 22 - 32 mmol/L 28  Calcium 8.9 - 10.3 mg/dL 9.2  Total Protein 6.5 - 8.1 g/dL 7.0  Total Bilirubin 0.3 - 1.2 mg/dL 1.0  Alkaline Phos 38 - 126 U/L 78  AST 15 - 41 U/L 19  ALT 0 - 44 U/L 28   CBC Latest Ref Rng & Units 03/19/2021  WBC 4.0 - 10.5 K/uL 5.1  Hemoglobin 12.0 - 15.0 g/dL 14.6  Hematocrit 36.0 - 46.0 % 44.6  Platelets 150 - 400 K/uL 163      Assessment and plan- Patient is a 65 y.o. female with history of stage I diffuse large B-cell lymphoma s/p 3 cycles of R-CHOP chemotherapy and IMRT currently in CR 1.  This is a routine follow-up visit  Patient is now 3 years out for diagnosis of diffuse large B-cell lymphoma.  Clinically she is doing well with no concerning signs and symptoms of recurrence based on today's exam.  No role for routine surveillance imaging at this time.  I will see her back in 6 months with CBC with differential CMP and LDH.  Labs from today are unremarkable   Visit Diagnosis 1. Encounter for follow-up surveillance of diffuse large B-cell lymphoma      Dr. Randa Evens, MD, MPH [facility] [phone] 03/19/2021 12:45 PM

## 2021-03-19 NOTE — Progress Notes (Signed)
Patient here today for follow up regarding lymphoma. Patient denies any concerns today.

## 2021-03-28 ENCOUNTER — Other Ambulatory Visit: Payer: Self-pay | Admitting: Family Medicine

## 2021-04-03 ENCOUNTER — Other Ambulatory Visit: Payer: Self-pay

## 2021-04-03 ENCOUNTER — Emergency Department
Admission: EM | Admit: 2021-04-03 | Discharge: 2021-04-04 | Disposition: A | Discharge status: Home / Self Care | Payer: 59 | Attending: Emergency Medicine | Admitting: Emergency Medicine

## 2021-04-03 ENCOUNTER — Emergency Department: Payer: 59

## 2021-04-03 DIAGNOSIS — R61 Generalized hyperhidrosis: Secondary | ICD-10-CM | POA: Insufficient documentation

## 2021-04-03 DIAGNOSIS — Z853 Personal history of malignant neoplasm of breast: Secondary | ICD-10-CM | POA: Insufficient documentation

## 2021-04-03 DIAGNOSIS — R079 Chest pain, unspecified: Secondary | ICD-10-CM

## 2021-04-03 DIAGNOSIS — R0789 Other chest pain: Secondary | ICD-10-CM | POA: Insufficient documentation

## 2021-04-03 DIAGNOSIS — R0602 Shortness of breath: Secondary | ICD-10-CM | POA: Insufficient documentation

## 2021-04-03 LAB — CBC
HCT: 39 % (ref 36.0–46.0)
Hemoglobin: 12.9 g/dL (ref 12.0–15.0)
MCH: 29.6 pg (ref 26.0–34.0)
MCHC: 33.1 g/dL (ref 30.0–36.0)
MCV: 89.4 fL (ref 80.0–100.0)
Platelets: 163 10*3/uL (ref 150–400)
RBC: 4.36 MIL/uL (ref 3.87–5.11)
RDW: 12.7 % (ref 11.5–15.5)
WBC: 7 10*3/uL (ref 4.0–10.5)
nRBC: 0 % (ref 0.0–0.2)

## 2021-04-03 NOTE — ED Notes (Signed)
Family updated as to patient's status.

## 2021-04-03 NOTE — ED Provider Notes (Signed)
[facility] Emergency Department Provider Note  ____________________________________________   Event Date/Time   First MD Initiated Contact with Patient 04/03/21 2300     (approximate)  I have reviewed the triage vital signs and the nursing notes.   HISTORY  Chief Complaint Chest Pain    HPI Victoria Benson is a 65 y.o. female with history of previous breast cancer status post lumpectomy and radiation, previous lymphoma status post chemotherapy currently in remission from both who presents to the emergency department with complaints of right-sided chest burning, tightness and pressure that started tonight while talking to her son on the phone.  States pain radiated to the center of her chest.  She states symptoms lasted for about 5 minutes and she felt that she was going to pass out, felt short of breath and was diaphoretic.  She states she is now asymptomatic.  No recent fevers or cough.  No lower extremity swelling or pain.  No history of CAD, PE, DVT.  She denies any known aggravating or alleviating factors.        Past Medical History:  Diagnosis Date  . BRCA gene mutation negative 07/10/2016   MyRisk neg  . Breast cancer ([place]) 2009   T1c (1.2 cm) N0 ER 90%, PR 90%, HER-2/neu not amplified. Oncotype DX score, low risk (14) 9% chance of recurrence with antiestrogen therapy alone.  . Breast cancer ([place]) 06-17-16   INVASIVE MAMMARY CARCINOMA . T1, ER positive, PR positive, HER-2/neu not overexpressed  . Diffuse large B-cell lymphoma ([place]) 11/2017   Right cervical adenopathy  . Family history of adverse reaction to anesthesia    PTS MOM-HARD TO WAKE UP  . Heart murmur   . History of basal cell carcinoma 02/11/2011   right upper back  . History of dysplastic nevus 06/20/2011   left scapula  . Personal history of chemotherapy 2019   lymphoma  . Personal history of radiation therapy 2009   right breast cancer  . Personal history of radiation therapy  2017   right breast cancer  . Radiation 2009   BREAST CA  . Skin cancer     Patient Active Problem List   Diagnosis Date Noted  . Edema of left lower extremity 09/29/2020  . Varicose veins of left lower extremity with pain 09/29/2020  . Malodorous urine 09/29/2020  . Class 1 obesity without serious comorbidity with body mass index (BMI) of 33.0 to 33.9 in adult 09/28/2019  . Joint stiffness 09/28/2019  . Polyarthralgia 09/28/2019  . DLBCL (diffuse large B cell lymphoma) ([place]) 01/14/2018  . Chronic venous insufficiency 04/21/2017  . Leg pain 04/21/2017  . Malignant neoplasm of upper-inner quadrant of right breast in female, estrogen receptor positive ([place]) 07/11/2016  . Knee pain, left 05/14/2016  . Subclinical hypothyroidism 03/20/2016  . Allergic rhinitis 01/04/2016  . Varicose veins of both lower extremities with pain 01/04/2016  . Avitaminosis D 05/18/2009  . Fam hx-ischem heart disease 04/05/2009  . HLD (hyperlipidemia) 04/04/2009  . Adaptive colitis 04/04/2009    Past Surgical History:  Procedure Laterality Date  . APPENDECTOMY    . BREAST BIOPSY Right 06/17/2016   invasive. T1, ER positive, PR positive, HER-2/neu not overexpressed  . BREAST CYST ASPIRATION Left 2002  . BREAST EXCISIONAL BIOPSY Right 06/2016   lumpectomy with rad   . BREAST EXCISIONAL BIOPSY Right 2009   breast ca + mammo site radiation  . BREAST LUMPECTOMY Right 2017   invasive mammary NEG margins  .  BREAST LUMPECTOMY Right 2009   mammosite  . BREAST LUMPECTOMY WITH NEEDLE LOCALIZATION Right 07/18/2016   Procedure: BREAST LUMPECTOMY WITH NEEDLE LOCALIZATION AND RIGHT AXILLARY EXPLORATION;  Surgeon: Robert Bellow, MD;  Location: [facility] ORS;  Service: General;  Laterality: Right;  . BREAST SURGERY Right July 2009   Wide excision, sentinel left node biopsy MammoSite. ( 06/21/2008  . CHOLECYSTECTOMY  1984  . COLONOSCOPY WITH PROPOFOL N/A 10/13/2018   Procedure: COLONOSCOPY WITH PROPOFOL;  Surgeon:  Lucilla Lame, MD;  [facility] ENDOSCOPY;  Service: Endoscopy;  Laterality: N/A;  . DILATION AND CURETTAGE OF UTERUS  2004  . ENDOMETRIAL ABLATION  2005  . IR FLUORO GUIDE PORT INSERTION RIGHT  01/15/2018  . TUMOR REMOVAL  12/2013   from uterus done by [facility]    Prior to Admission medications   Medication Sig Start Date End Date Taking? Authorizing Provider  CALCIUM-MAGNESIUM-VITAMIN D ER PO Take 1 tablet by mouth daily.    [provider]  cetirizine (ZYRTEC) 10 MG tablet Take 10 mg daily by mouth.    [provider]  fluticasone (FLONASE) 50 MCG/ACT nasal spray USE 2 SPRAYS INTO BOTH NOSTRILS DAILY 03/28/21   Virginia Crews, MD  Multiple Vitamin (MULTIVITAMIN) capsule Take 1 capsule by mouth daily.    [provider]  Omega-3 Krill Oil 500 MG CAPS Take 1,000 mg by mouth daily.     [provider]    Allergies Patient has no known allergies.  Family History  Problem Relation Age of Onset  . CAD Mother   . Osteoporosis Mother   . Lung cancer Father   . Diabetes Father   . Hypertension Brother   . Ovarian cancer Maternal Grandmother   . Heart attack Paternal Grandmother   . Diabetes Paternal Grandfather   . Fibromyalgia Brother   . Hypertension Brother   . Hypertension Brother   . Sleep apnea Brother   . Healthy Brother   . Sleep apnea Brother   . Ovarian cancer Other   . Breast cancer Neg Hx     Social History Social History   Tobacco Use  . Smoking status: Never Smoker  . Smokeless tobacco: Never Used  Vaping Use  . Vaping Use: Never used  Substance Use Topics  . Alcohol use: No  . Drug use: No    Review of Systems Constitutional: No fever. Eyes: No visual changes. ENT: No sore throat. Cardiovascular: + chest pain. Respiratory: + shortness of breath. Gastrointestinal: No nausea, vomiting, diarrhea. Genitourinary: Negative for dysuria. Musculoskeletal: Negative for back pain. Skin: Negative for  rash. Neurological: Negative for focal weakness or numbness.  ____________________________________________   PHYSICAL EXAM:  VITAL SIGNS: ED Triage Vitals  Enc Vitals Group     BP 04/03/21 2230 122/73     Pulse Rate 04/03/21 2230 65     Resp 04/03/21 2230 18     Temp 04/03/21 2230 98.2 F (36.8 C)     Temp Source 04/03/21 2230 Oral     SpO2 04/03/21 2230 100 %     Weight 04/03/21 2231 176 lb (79.8 kg)     Height 04/03/21 2231 5' 2"  (1.575 m)     Head Circumference --      Peak Flow --      Pain Score 04/03/21 2231 1     Pain Loc --      Pain Edu? --      Excl. in [place]? --    CONSTITUTIONAL: Alert and  oriented and responds appropriately to questions. Well-appearing; well-nourished HEAD: Normocephalic EYES: Conjunctivae clear, pupils appear equal, EOM appear intact ENT: normal nose; moist mucous membranes NECK: Supple, normal ROM CARD: RRR; S1 and S2 appreciated; no murmurs, no clicks, no rubs, no gallops CHEST:  Chest wall is nontender to palpation.  No crepitus, ecchymosis, erythema, warmth, rash or other lesions present.   RESP: Normal chest excursion without splinting or tachypnea; breath sounds clear and equal bilaterally; no wheezes, no rhonchi, no rales, no hypoxia or respiratory distress, speaking full sentences ABD/GI: Normal bowel sounds; non-distended; soft, non-tender, no rebound, no guarding, no peritoneal signs, no hepatosplenomegaly BACK: The back appears normal EXT: Normal ROM in all joints; no deformity noted, no edema; no cyanosis, no calf tenderness or calf swelling SKIN: Normal color for age and race; warm; no rash on exposed skin NEURO: Moves all extremities equally PSYCH: The patient's mood and manner are appropriate.  ____________________________________________   LABS (all labs ordered are listed, but only abnormal results are displayed)  Labs Reviewed  BASIC METABOLIC PANEL  CBC  D-DIMER, QUANTITATIVE  TROPONIN I (HIGH SENSITIVITY)    ____________________________________________  EKG   EKG Interpretation  Date/Time:  Tuesday April 03 2021 22:28:27 EDT Ventricular Rate:  68 PR Interval:  126 QRS Duration: 106 QT Interval:  412 QTC Calculation: 438 R Axis:   -7 Text Interpretation: Normal sinus rhythm Low voltage QRS Incomplete right bundle branch block Borderline ECG Confirmed by Pryor Curia [phone]) on 04/03/2021 11:15:11 PM       ____________________________________________  RADIOLOGY Jessie Foot Johari Pinney, personally viewed and evaluated these images (plain radiographs) as part of my medical decision making, as well as reviewing the written report by the radiologist.  ED MD interpretation: Chest x-ray clear.  Official radiology report(s): DG Chest 2 View  Result Date: 04/03/2021 CLINICAL DATA:  Right side chest pain EXAM: CHEST - 2 VIEW COMPARISON:  05/08/2018 FINDINGS: Heart and mediastinal contours are within normal limits. No focal opacities or effusions. No acute bony abnormality. IMPRESSION: No active cardiopulmonary disease. Electronically Signed   By: Rolm Baptise M.D.   On: 04/03/2021 22:53    ____________________________________________   PROCEDURES  Procedure(s) performed (including Critical Care):  Procedures   ____________________________________________   INITIAL IMPRESSION / ASSESSMENT AND PLAN / ED COURSE  As part of my medical decision making, I reviewed the following data within the [place] History obtained from family, Nursing notes reviewed and incorporated, Labs reviewed , EKG interpreted , Old EKG reviewed, Old chart reviewed, Radiograph reviewed  and Notes from prior ED visits         Patient here with atypical right-sided chest pain.  EKG nonischemic.  Cardiac labs pending.  Will also obtain D-dimer given her previous history of breast cancer and lymphoma.  No infectious symptoms and chest x-ray clear.  Does not appear volume overloaded on exam.  Doubt  dissection.  ED PROGRESS  12:25 AM  Pt's troponin is negative.  D-dimer negative.  Chest x-ray clear.  Patient still asymptomatic.  Plan is to reassess after second troponin and if this is flat, will discharge home with outpatient follow-up.  1:45 AM  Pt's repeat troponin is flat.  Patient still asymptomatic.  Will discharge home with close outpatient follow-up.  Discussed return precautions.  Patient comfortable with plan.  At this time, I do not feel there is any life-threatening condition present. I have reviewed, interpreted and discussed all results (EKG, imaging, lab, urine as appropriate) and exam  findings with patient/family. I have reviewed nursing notes and appropriate previous records.  I feel the patient is safe to be discharged home without further emergent workup and can continue workup as an outpatient as needed. Discussed usual and customary return precautions. Patient/family verbalize understanding and are comfortable with this plan.  Outpatient follow-up has been provided as needed. All questions have been answered.  ____________________________________________   FINAL CLINICAL IMPRESSION(S) / ED DIAGNOSES  Final diagnoses:  Right-sided chest pain     ED Discharge Orders    None      *Please note:  CHERYLE DARK was evaluated in Emergency Department on 04/03/2021 for the symptoms described in the history of present illness. She was evaluated in the context of the global COVID-19 pandemic, which necessitated consideration that the patient might be at risk for infection with the SARS-CoV-2 virus that causes COVID-19. Institutional protocols and algorithms that pertain to the evaluation of patients at risk for COVID-19 are in a state of rapid change based on information released by regulatory bodies including the CDC and federal and state organizations. These policies and algorithms were followed during the patient's care in the ED.  Some ED evaluations and interventions may be  delayed as a result of limited staffing during and the pandemic.*   Note:  This document was prepared using Dragon voice recognition software and may include unintentional dictation errors.   Andreka Stucki, Delice Bison, DO 04/04/21 [phone]

## 2021-04-03 NOTE — ED Triage Notes (Addendum)
Pt is a 65 y/o f who presents with R sided chest pain radiating towards the center that began suddenly tonight at rest. Pt reports associated dizziness, SOB, and diaphoresis. Denies known cardiac hx. Hx of breast CA with lumpectomy on R side.

## 2021-04-04 LAB — BASIC METABOLIC PANEL
Anion gap: 8 (ref 5–15)
BUN: 23 mg/dL (ref 8–23)
CO2: 27 mmol/L (ref 22–32)
Calcium: 9.3 mg/dL (ref 8.9–10.3)
Chloride: 102 mmol/L (ref 98–111)
Creatinine, Ser: 0.81 mg/dL (ref 0.44–1.00)
GFR, Estimated: >60 mL/min (ref >60)
Glucose, Bld: 119 mg/dL — ABNORMAL HIGH (ref 70–99)
Potassium: 3.5 mmol/L (ref 3.5–5.1)
Sodium: 137 mmol/L (ref 135–145)

## 2021-04-04 LAB — D-DIMER, QUANTITATIVE: D-Dimer, Quant: 0.34 ug/mL-FEU (ref 0.00–0.50)

## 2021-04-04 LAB — TROPONIN I (HIGH SENSITIVITY)
Troponin I (High Sensitivity): 3 ng/L (ref <18)
Troponin I (High Sensitivity): <2 ng/L (ref <18)

## 2021-04-04 NOTE — Discharge Instructions (Addendum)
Your cardiac labs, chest x-ray, EKG today were reassuring.  Your D-dimer was also negative which rules out blood clots.  I do recommend close follow-up with your primary care physician.  If you have return of any of your symptoms, please return to the emergency department.

## 2021-05-15 ENCOUNTER — Other Ambulatory Visit: Payer: Self-pay

## 2021-05-15 ENCOUNTER — Ambulatory Visit: Payer: 59 | Admitting: Family Medicine

## 2021-05-15 ENCOUNTER — Encounter: Payer: Self-pay | Admitting: Family Medicine

## 2021-05-15 VITALS — BP 94/51 | HR 79 | Temp 98.3°F | Resp 16 | Ht 62.0 in | Wt 175.2 lb

## 2021-05-15 DIAGNOSIS — R0789 Other chest pain: Secondary | ICD-10-CM | POA: Diagnosis not present

## 2021-05-15 DIAGNOSIS — M2550 Pain in unspecified joint: Secondary | ICD-10-CM

## 2021-05-15 NOTE — Progress Notes (Signed)
Established patient visit   Patient: Victoria Benson   DOB: [birth year]   65 y.o. Female  MRN: [id number] Visit Date: 05/15/2021  Today's healthcare provider: Lavon Paganini, MD   Chief Complaint  Patient presents with  . Follow-up   Subjective    HPI  LE Bone Aches She is experiencing aches in her bones that radiate from her ankles up through her hips. Her pain is predominant at night before bed. She has taken Meloxicam that has provided some improvement.  She denies that her oncologist has not prescribed her any medication. She is currently not taking cholesterol medication. She had a spider bite that got infected and she started topical treatment of bacitracin and she was curious if this was related to the bone aches.   Follow up ER visit  Patient was seen in ER for chest pain on 04/03/21. She was treated for right-sided chest pain. Treatment for this included labs, ekg. Troponin-negative, D-dimer negative, Chest x-ray clear.  She reports excellent compliance with treatment. She reports this condition is Improved.  She says that her chest pain has resolved since her visit. The pain on the side of her right breast and radiate posterioly midline of her sternum. The pain was the same side that she had her breast cancer. She denies doing anything new outside of her normal activities.   -----------------------------------------------------------------------------------------  Patient Active Problem List   Diagnosis Date Noted  . Other chest pain 05/15/2021  . Edema of left lower extremity 09/29/2020  . Varicose veins of left lower extremity with pain 09/29/2020  . Malodorous urine 09/29/2020  . Class 1 obesity without serious comorbidity with body mass index (BMI) of 33.0 to 33.9 in adult 09/28/2019  . Joint stiffness 09/28/2019  . Polyarthralgia 09/28/2019  . DLBCL (diffuse large B cell lymphoma) ([place]) 01/14/2018  . Chronic venous insufficiency 04/21/2017  . Leg pain  04/21/2017  . Malignant neoplasm of upper-inner quadrant of right breast in female, estrogen receptor positive ([place]) 07/11/2016  . Knee pain, left 05/14/2016  . Subclinical hypothyroidism 03/20/2016  . Allergic rhinitis 01/04/2016  . Varicose veins of both lower extremities with pain 01/04/2016  . Avitaminosis D 05/18/2009  . Fam hx-ischem heart disease 04/05/2009  . HLD (hyperlipidemia) 04/04/2009  . Adaptive colitis 04/04/2009   Social History   Tobacco Use  . Smoking status: Never Smoker  . Smokeless tobacco: Never Used  Vaping Use  . Vaping Use: Never used  Substance Use Topics  . Alcohol use: No  . Drug use: No   No Known Allergies     Medications: Outpatient Medications Prior to Visit  Medication Sig  . CALCIUM-MAGNESIUM-VITAMIN D ER PO Take 1 tablet by mouth daily.  . cetirizine (ZYRTEC) 10 MG tablet Take 10 mg daily by mouth.  . cyclobenzaprine (FLEXERIL) 10 MG tablet Take 10 mg by mouth 3 (three) times daily as needed for muscle spasms.  . fluticasone (FLONASE) 50 MCG/ACT nasal spray USE 2 SPRAYS INTO BOTH NOSTRILS DAILY  . meloxicam (MOBIC) 15 MG tablet Take 15 mg by mouth daily.  . Multiple Vitamin (MULTIVITAMIN) capsule Take 1 capsule by mouth daily.  . Omega-3 Krill Oil 500 MG CAPS Take 1,000 mg by mouth daily.    No facility-administered medications prior to visit.    Review of Systems  Constitutional: Negative for appetite change, chills, fatigue and fever.  HENT: Negative for ear pain, nosebleeds, sinus pressure, sinus pain and sore throat.   Eyes: Negative for pain.  Respiratory: Negative for cough, chest tightness, shortness of breath and wheezing.   Cardiovascular: Positive for leg swelling (calf). Negative for chest pain and palpitations.  Gastrointestinal: Negative for abdominal pain, blood in stool, diarrhea, nausea and vomiting.  Genitourinary: Negative for dysuria, flank pain, frequency, pelvic pain and urgency.  Musculoskeletal: Positive for  myalgias. Negative for back pain, neck pain and neck stiffness.  Neurological: Negative for dizziness, syncope, weakness, light-headedness, numbness and headaches.    Last CBC Lab Results  Component Value Date   WBC 7.0 04/03/2021   HGB 12.9 04/03/2021   HCT 39.0 04/03/2021   MCV 89.4 04/03/2021   MCH 29.6 04/03/2021   RDW 12.7 04/03/2021   PLT 163 46/65/9935   Last metabolic panel Lab Results  Component Value Date   GLUCOSE 119 (H) 04/03/2021   NA 137 04/03/2021   K 3.5 04/03/2021   CL 102 04/03/2021   CO2 27 04/03/2021   BUN 23 04/03/2021   CREATININE 0.81 04/03/2021   GFRNONAA >60 04/03/2021   GFRAA >60 08/25/2020   CALCIUM 9.3 04/03/2021   PHOS 2.7 01/08/2018   PROT 7.0 03/19/2021   ALBUMIN 4.2 03/19/2021   LABGLOB 2.3 03/25/2017   AGRATIO 2.0 03/25/2017   BILITOT 1.0 03/19/2021   ALKPHOS 78 03/19/2021   AST 19 03/19/2021   ALT 28 03/19/2021   ANIONGAP 8 04/03/2021   Last lipids Lab Results  Component Value Date   CHOL 215 (H) 09/29/2020   HDL 49 09/29/2020   LDLCALC 128 (H) 09/29/2020   TRIG 217 (H) 09/29/2020   CHOLHDL 3.9 09/28/2019        Objective    BP (!) 94/51 (BP Location: Left Arm, Patient Position: Sitting, Cuff Size: Large)   Pulse 79   Temp 98.3 F (36.8 C) (Oral)   Resp 16   Ht 5' 2"  (1.575 m)   Wt 175 lb 3.2 oz (79.5 kg)   SpO2 99%   BMI 32.04 kg/m  BP Readings from Last 3 Encounters:  05/15/21 (!) 94/51  04/04/21 101/62  03/19/21 109/75   Wt Readings from Last 3 Encounters:  05/15/21 175 lb 3.2 oz (79.5 kg)  04/03/21 176 lb (79.8 kg)  03/19/21 176 lb (79.8 kg)      Physical Exam Vitals reviewed.  Constitutional:      General: She is not in acute distress.    Appearance: Normal appearance. She is well-developed. She is not diaphoretic.  HENT:     Head: Normocephalic and atraumatic.  Eyes:     General: No scleral icterus.    Conjunctiva/sclera: Conjunctivae normal.  Neck:     Thyroid: No thyromegaly.   Cardiovascular:     Rate and Rhythm: Normal rate and regular rhythm.     Pulses: Normal pulses.     Heart sounds: Normal heart sounds. No murmur heard.   Pulmonary:     Effort: Pulmonary effort is normal. No respiratory distress.     Breath sounds: Normal breath sounds. No wheezing, rhonchi or rales.  Musculoskeletal:     Cervical back: Neck supple.     Right lower leg: Edema present.     Left lower leg: Edema present.  Lymphadenopathy:     Cervical: No cervical adenopathy.  Skin:    General: Skin is warm and dry.     Findings: No rash.     Comments: Diffuse varicose veins and spider veins  Neurological:     Mental Status: She is alert and oriented to person, place, and  time. Mental status is at baseline.  Psychiatric:        Mood and Affect: Mood normal.        Behavior: Behavior normal.      No results found for any visits on 05/15/21.  Assessment & Plan     Problem List Items Addressed This Visit      Other   Polyarthralgia - Primary    New onset bilateral diffuse leg pain that is described as a deep deep ache that feels like bone pain Occurred after she had a spider bite/abscess, so could be postinfectious in nature Wonder if her varicose veins may be contributing and aching at the end of the day as they are diffuse over both legs Can consider vascular surgery referral in the future Will check ESR, CRP, CK to look for underlying conditions She is not on any medications that should be causing myalgias or bone pain Use Mobic as needed Return precautions discussed      Relevant Orders   Sed Rate (ESR)   C-reactive protein   CK   Other chest pain    Has now resolved Likely costochondritis versus intercostal muscle strain MSK in origin and Reassured patient that the testing that was done in the emergency room that ruled out PE and MI Return precautions/red flags discussed          Return in about 5 months (around 10/15/2021) for as scheduled.      Total  time spent on today's visit was greater than 30 minutes, including both face-to-face time and nonface-to-face time personally spent on review of chart (labs and imaging), discussing labs and goals, discussing further work-up, treatment options, referrals to specialist if needed, reviewing outside records of pertinent, answering patient's questions, and coordinating care.    I,Essence Turner,acting as a Education administrator for Lavon Paganini, MD.,have documented all relevant documentation on the behalf of Lavon Paganini, MD,as directed by  Lavon Paganini, MD while in the presence of Lavon Paganini, MD.  I, Lavon Paganini, MD, have reviewed all documentation for this visit. The documentation on 05/15/21 for the exam, diagnosis, procedures, and orders are all accurate and complete.   Parley Pidcock, Dionne Bucy, MD, MPH [facility]

## 2021-05-15 NOTE — Assessment & Plan Note (Signed)
Has now resolved Likely costochondritis versus intercostal muscle strain MSK in origin and Reassured patient that the testing that was done in the emergency room that ruled out PE and MI Return precautions/red flags discussed

## 2021-05-15 NOTE — Assessment & Plan Note (Signed)
New onset bilateral diffuse leg pain that is described as a deep deep ache that feels like bone pain Occurred after she had a spider bite/abscess, so could be postinfectious in nature Wonder if her varicose veins may be contributing and aching at the end of the day as they are diffuse over both legs Can consider vascular surgery referral in the future Will check ESR, CRP, CK to look for underlying conditions She is not on any medications that should be causing myalgias or bone pain Use Mobic as needed Return precautions discussed

## 2021-05-16 LAB — C-REACTIVE PROTEIN: CRP: <1 mg/L (ref 0–10)

## 2021-05-16 LAB — SEDIMENTATION RATE: Sed Rate: 10 mm/hr (ref 0–40)

## 2021-08-15 ENCOUNTER — Ambulatory Visit: Payer: Self-pay | Admitting: *Deleted

## 2021-08-15 NOTE — Telephone Encounter (Signed)
Patient shares that they've been experiencing dizziness this week   The patient shares that the sensation begins in the afternoons mostly   The patient would like to discuss this further when possible       Called patient to review symptoms. No answer, LVMTCB.

## 2021-08-15 NOTE — Telephone Encounter (Signed)
Please review.  It looks like you have four openings tomorrow but I'm not able to schedule Ms. Platter.  Are the openings blocked?  Thanks,   -Mickel Baas

## 2021-08-15 NOTE — Telephone Encounter (Signed)
Pt. Having dizziness since Sunday. Comes at different intervals. No vertigo, no other symptoms. BP is "good." "I thought it could be my blood sugar,I don't know." Would like to be worked in, no availability in the practice. Please advise.    Reason for Disposition  [1] MODERATE dizziness (e.g., interferes with normal activities) AND [2] has NOT been evaluated by physician for this  (Exception: dizziness caused by heat exposure, sudden standing, or poor fluid intake)  Answer Assessment - Initial Assessment Questions 1. DESCRIPTION: "Describe your dizziness."     Dizzy 2. LIGHTHEADED: "Do you feel lightheaded?" (e.g., somewhat faint, woozy, weak upon standing)     Woozy 3. VERTIGO: "Do you feel like either you or the room is spinning or tilting?" (i.e. vertigo)     No 4. SEVERITY: "How bad is it?"  "Do you feel like you are going to faint?" "Can you stand and walk?"   - MILD: Feels slightly dizzy, but walking normally.   - MODERATE: Feels unsteady when walking, but not falling; interferes with normal activities (e.g., school, work).   - SEVERE: Unable to walk without falling, or requires assistance to walk without falling; feels like passing out now.      Mild 5. ONSET:  "When did the dizziness begin?"     Sunday 6. AGGRAVATING FACTORS: "Does anything make it worse?" (e.g., standing, change in head position)     No 7. HEART RATE: "Can you tell me your heart rate?" "How many beats in 15 seconds?"  (Note: not all patients can do this)       No 8. CAUSE: "What do you think is causing the dizziness?"     Unsure 9. RECURRENT SYMPTOM: "Have you had dizziness before?" If Yes, ask: "When was the last time?" "What happened that time?"     No 10. OTHER SYMPTOMS: "Do you have any other symptoms?" (e.g., fever, chest pain, vomiting, diarrhea, bleeding)       No 11. PREGNANCY: "Is there any chance you are pregnant?" "When was your last menstrual period?"       No  Protocols used: Dizziness -  Lightheadedness-A-AH

## 2021-08-16 ENCOUNTER — Ambulatory Visit (INDEPENDENT_AMBULATORY_CARE_PROVIDER_SITE_OTHER): Payer: Managed Care, Other (non HMO) | Admitting: Family Medicine

## 2021-08-16 ENCOUNTER — Encounter: Payer: Self-pay | Admitting: Family Medicine

## 2021-08-16 ENCOUNTER — Other Ambulatory Visit: Payer: Self-pay

## 2021-08-16 VITALS — BP 102/64 | HR 82 | Temp 97.9°F | Resp 16 | Wt 175.3 lb

## 2021-08-16 DIAGNOSIS — I959 Hypotension, unspecified: Secondary | ICD-10-CM | POA: Diagnosis not present

## 2021-08-16 DIAGNOSIS — R739 Hyperglycemia, unspecified: Secondary | ICD-10-CM

## 2021-08-16 DIAGNOSIS — E038 Other specified hypothyroidism: Secondary | ICD-10-CM | POA: Diagnosis not present

## 2021-08-16 NOTE — Assessment & Plan Note (Signed)
-   Chronic, previously stable - Recheck TSH

## 2021-08-16 NOTE — Telephone Encounter (Signed)
No they are not. Maybe the admin staff can if you cannot schedule

## 2021-08-16 NOTE — Telephone Encounter (Signed)
Patient has appt schedule for 1:40 today.

## 2021-08-16 NOTE — Progress Notes (Signed)
Established patient visit   Patient: Victoria Benson   DOB: 1956/12/11   65 y.o. Female  MRN: [id number] Visit Date: 08/16/2021  Today's healthcare provider: Lavon Paganini, MD   Subjective     Lightheadedness - Pt. presents with 4 days of new-onset lightheadedness ("feels like I'm going to pass out") - Denies syncope, chest pain, n/v, fever, numbness, tachycardia, palpitations, vision changes, positional changes, & vertigo - Denies history of seizures, thyroid conditions, stroke, heart disease, & anemia - Pt. reports associated intermittent HA's & diaphoresis during the past 4 days - Recently stopped taking Meloxicam, but denies other health changes  Medications: Outpatient Medications Prior to Visit  Medication Sig   CALCIUM-MAGNESIUM-VITAMIN D ER PO Take 1 tablet by mouth daily.   cetirizine (ZYRTEC) 10 MG tablet Take 10 mg daily by mouth.   fluticasone (FLONASE) 50 MCG/ACT nasal spray USE 2 SPRAYS INTO BOTH NOSTRILS DAILY   meloxicam (MOBIC) 15 MG tablet Take 15 mg by mouth daily.   Multiple Vitamin (MULTIVITAMIN) capsule Take 1 capsule by mouth daily.   Omega-3 Krill Oil 500 MG CAPS Take 1,000 mg by mouth daily.    cyclobenzaprine (FLEXERIL) 10 MG tablet Take 10 mg by mouth 3 (three) times daily as needed for muscle spasms. (Patient not taking: Reported on 08/16/2021)   No facility-administered medications prior to visit.    Review of Systems  Constitutional:  Positive for diaphoresis. Negative for activity change, fatigue and fever.  HENT: Negative.    Eyes: Negative.   Respiratory:  Negative for chest tightness and shortness of breath.   Cardiovascular:  Negative for chest pain, palpitations and leg swelling.  Gastrointestinal: Negative.  Negative for nausea and vomiting.  Endocrine: Negative.   Genitourinary: Negative.   Musculoskeletal: Negative.   Skin: Negative.  Negative for pallor.  Neurological:  Positive for light-headedness and headaches. Negative for  syncope, speech difficulty, weakness and numbness.  Hematological: Negative.   Psychiatric/Behavioral: Negative.        Objective    BP 102/64   Pulse 82   Temp 97.9 F (36.6 C) (Oral)   Resp 16   Wt 175 lb 4.8 oz (79.5 kg)   SpO2 99%   BMI 32.06 kg/m     Physical Exam Constitutional:      General: She is not in acute distress.    Appearance: Normal appearance. She is obese.  HENT:     Head: Normocephalic and atraumatic.     Right Ear: External ear normal.     Left Ear: External ear normal.  Eyes:     Conjunctiva/sclera: Conjunctivae normal.  Cardiovascular:     Rate and Rhythm: Normal rate and regular rhythm.     Pulses: Normal pulses.     Heart sounds: Normal heart sounds.  Pulmonary:     Effort: Pulmonary effort is normal.     Breath sounds: Normal breath sounds.  Abdominal:     General: Abdomen is flat. Bowel sounds are normal.     Palpations: Abdomen is soft.  Skin:    General: Skin is warm and dry.     Comments: skin tenting of dorsal surfaces of both hands  Neurological:     Mental Status: She is alert.  Psychiatric:        Behavior: Behavior normal.        Thought Content: Thought content normal.     No results found for any visits on 08/16/21.  Assessment & Plan  Problem List Items Addressed This Visit       Endocrine   Subclinical hypothyroidism    - Chronic, previously stable - Recheck TSH, T4      Relevant Orders   TSH + free T4   Other Visit Diagnoses     Hypotension, unspecified hypotension type    -  Primary    - Acute on chronic hypotension, likely contributing to lightheadedness  - Normal orthostatics in office today with consistently low BPs of 102-111/62-64   - Mild dehydration on exam  - Recommended increasing oral hydration with electrolyte-rich beverages, including Gatorade & Pedialyte  - Recommended using compression stockings for venous stasis/lightheadedness  - Recheck CBC, CMP, A1c, & thyroid panel     Relevant  Orders   CBC   Comprehensive metabolic panel   Hemoglobin A1c   TSH + free T4   Hyperglycemia        - Chronic, previously stable  - Possibly contributing to recent dizziness, but of lower suspicion vs. hypotensive etiology  - Recheck A1c    Relevant Orders   Hemoglobin A1c        Return in about 2 months (around 10/16/2021) for as scheduled.        Percell Locus, MS3    Patient seen along with MS3 student Percell Locus. I personally evaluated this patient along with the student, and verified all aspects of the history, physical exam, and medical decision making as documented by the student. I agree with the student's documentation and have made all necessary edits.  Catrena Vari, Dionne Bucy, MD, MPH [facility]

## 2021-08-17 LAB — COMPREHENSIVE METABOLIC PANEL
ALT: 25 IU/L (ref 0–32)
AST: 18 IU/L (ref 0–40)
Albumin/Globulin Ratio: 2.2 (ref 1.2–2.2)
Albumin: 4.4 g/dL (ref 3.8–4.8)
Alkaline Phosphatase: 99 IU/L (ref 44–121)
BUN/Creatinine Ratio: 19 (ref 12–28)
BUN: 13 mg/dL (ref 8–27)
Bilirubin Total: 0.3 mg/dL (ref 0.0–1.2)
CO2: 27 mmol/L (ref 20–29)
Calcium: 10.2 mg/dL (ref 8.7–10.3)
Chloride: 102 mmol/L (ref 96–106)
Creatinine, Ser: 0.68 mg/dL (ref 0.57–1.00)
Globulin, Total: 2 g/dL (ref 1.5–4.5)
Glucose: 99 mg/dL (ref 65–99)
Potassium: 4.2 mmol/L (ref 3.5–5.2)
Sodium: 143 mmol/L (ref 134–144)
Total Protein: 6.4 g/dL (ref 6.0–8.5)
eGFR: 97 mL/min/{1.73_m2} (ref >59)

## 2021-08-17 LAB — CBC
Hematocrit: 44 % (ref 34.0–46.6)
Hemoglobin: 14.5 g/dL (ref 11.1–15.9)
MCH: 29.5 pg (ref 26.6–33.0)
MCHC: 33 g/dL (ref 31.5–35.7)
MCV: 89 fL (ref 79–97)
Platelets: 183 10*3/uL (ref 150–450)
RBC: 4.92 x10E6/uL (ref 3.77–5.28)
RDW: 12.6 % (ref 11.7–15.4)
WBC: 6 10*3/uL (ref 3.4–10.8)

## 2021-08-17 LAB — TSH+FREE T4
Free T4: 1.16 ng/dL (ref 0.82–1.77)
TSH: 4.97 u[IU]/mL — ABNORMAL HIGH (ref 0.450–4.500)

## 2021-08-17 LAB — HEMOGLOBIN A1C
Est. average glucose Bld gHb Est-mCnc: 100 mg/dL
Hgb A1c MFr Bld: 5.1 % (ref 4.8–5.6)

## 2021-09-18 ENCOUNTER — Inpatient Hospital Stay (HOSPITAL_BASED_OUTPATIENT_CLINIC_OR_DEPARTMENT_OTHER): Payer: Managed Care, Other (non HMO) | Admitting: Oncology

## 2021-09-18 ENCOUNTER — Inpatient Hospital Stay: Payer: Managed Care, Other (non HMO) | Attending: Oncology

## 2021-09-18 ENCOUNTER — Encounter: Payer: Self-pay | Admitting: Oncology

## 2021-09-18 VITALS — BP 97/64 | HR 77 | Temp 97.3°F | Resp 20 | Wt 177.6 lb

## 2021-09-18 DIAGNOSIS — Z8041 Family history of malignant neoplasm of ovary: Secondary | ICD-10-CM | POA: Insufficient documentation

## 2021-09-18 DIAGNOSIS — Z8579 Personal history of other malignant neoplasms of lymphoid, hematopoietic and related tissues: Secondary | ICD-10-CM

## 2021-09-18 DIAGNOSIS — Z9221 Personal history of antineoplastic chemotherapy: Secondary | ICD-10-CM | POA: Diagnosis not present

## 2021-09-18 DIAGNOSIS — C8338 Diffuse large B-cell lymphoma, lymph nodes of multiple sites: Secondary | ICD-10-CM | POA: Insufficient documentation

## 2021-09-18 DIAGNOSIS — C8331 Diffuse large B-cell lymphoma, lymph nodes of head, face, and neck: Secondary | ICD-10-CM | POA: Diagnosis not present

## 2021-09-18 DIAGNOSIS — Z791 Long term (current) use of non-steroidal anti-inflammatories (NSAID): Secondary | ICD-10-CM | POA: Diagnosis not present

## 2021-09-18 DIAGNOSIS — Z08 Encounter for follow-up examination after completed treatment for malignant neoplasm: Secondary | ICD-10-CM

## 2021-09-18 DIAGNOSIS — Z85828 Personal history of other malignant neoplasm of skin: Secondary | ICD-10-CM | POA: Diagnosis not present

## 2021-09-18 DIAGNOSIS — Z923 Personal history of irradiation: Secondary | ICD-10-CM | POA: Diagnosis not present

## 2021-09-18 DIAGNOSIS — Z853 Personal history of malignant neoplasm of breast: Secondary | ICD-10-CM | POA: Diagnosis not present

## 2021-09-18 LAB — COMPREHENSIVE METABOLIC PANEL
ALT: 29 U/L (ref 0–44)
AST: 22 U/L (ref 15–41)
Albumin: 4.4 g/dL (ref 3.5–5.0)
Alkaline Phosphatase: 82 U/L (ref 38–126)
Anion gap: 7 (ref 5–15)
BUN: 14 mg/dL (ref 8–23)
CO2: 31 mmol/L (ref 22–32)
Calcium: 9.7 mg/dL (ref 8.9–10.3)
Chloride: 100 mmol/L (ref 98–111)
Creatinine, Ser: 0.7 mg/dL (ref 0.44–1.00)
GFR, Estimated: >60 mL/min (ref >60)
Glucose, Bld: 109 mg/dL — ABNORMAL HIGH (ref 70–99)
Potassium: 4.6 mmol/L (ref 3.5–5.1)
Sodium: 138 mmol/L (ref 135–145)
Total Bilirubin: 0.6 mg/dL (ref 0.3–1.2)
Total Protein: 7.3 g/dL (ref 6.5–8.1)

## 2021-09-18 LAB — CBC WITH DIFFERENTIAL/PLATELET
Abs Immature Granulocytes: 0.02 10*3/uL (ref 0.00–0.07)
Basophils Absolute: 0 10*3/uL (ref 0.0–0.1)
Basophils Relative: 1 %
Eosinophils Absolute: 0.1 10*3/uL (ref 0.0–0.5)
Eosinophils Relative: 2 %
HCT: 43.9 % (ref 36.0–46.0)
Hemoglobin: 14 g/dL (ref 12.0–15.0)
Immature Granulocytes: 0 %
Lymphocytes Relative: 22 %
Lymphs Abs: 1.2 10*3/uL (ref 0.7–4.0)
MCH: 29.5 pg (ref 26.0–34.0)
MCHC: 31.9 g/dL (ref 30.0–36.0)
MCV: 92.4 fL (ref 80.0–100.0)
Monocytes Absolute: 0.4 10*3/uL (ref 0.1–1.0)
Monocytes Relative: 7 %
Neutro Abs: 3.8 10*3/uL (ref 1.7–7.7)
Neutrophils Relative %: 68 %
Platelets: 170 10*3/uL (ref 150–400)
RBC: 4.75 MIL/uL (ref 3.87–5.11)
RDW: 12.6 % (ref 11.5–15.5)
WBC: 5.5 10*3/uL (ref 4.0–10.5)
nRBC: 0 % (ref 0.0–0.2)

## 2021-09-18 LAB — LACTATE DEHYDROGENASE: LDH: 134 U/L (ref 98–192)

## 2021-09-18 NOTE — Addendum Note (Signed)
Addended by: Drue Dun on: 09/18/2021 02:36 PM   Modules accepted: Orders

## 2021-09-18 NOTE — Progress Notes (Signed)
Hematology/Oncology Consult note [facility]  Telephone:([phone] [phone]  Patient Care Team: Virginia Crews, MD as PCP - General (Family Medicine) Margarita Rana, MD as Referring Physician (Family Medicine) Bary Castilla, Forest Gleason, MD (General Surgery) Sindy Guadeloupe, MD as Consulting Physician (Oncology) Noreene Filbert, MD as Referring Physician (Radiation Oncology)   Name of the patient: Victoria Benson  [id number]  06/16/1956   Date of visit: 09/18/21  Diagnosis- stage I diffuse large B-cell lymphoma in CR 1  Chief complaint/ Reason for visit-routine follow-up of diffuse large B-cell lymphoma  Heme/Onc history: patient is a 65 year old female who developed symptoms of upper respiratory infection and nasal congestion sometime in November 2018.  Around the same time she also developed a right neck mass which was initially attributed to possible tonsillitis and she was given antibiotics for the same.  However her right neck mass did not decrease in size and she eventually saw Dr. Pryor Ochoa from ENT.  Patient was found to have significant right cervical adenopathy and underwent CT of the soft tissue neck which showed right level 2 and 3 adenopathy favoring metastatic carcinoma.  Jugulodigastric node measuring up to 3.7 cm.  Asymmetric fullness of the right tonsillar fossa possibly primary site.    Patient underwent core biopsy of the right cervical lymph node. Pathology showed diffuse large B-cell lymphoma of the ABC subtype.  IHC was positive for BCL-2 and BCL 6 see Mick IHC was not tested due to insufficiency of the testing sample.  Ki-67 was high 80-90%.   PET/CT scan showed hypermetabolism in the tonsillar fossa bilaterally right greater than left as well as hypermetabolic lymphadenopathy in the right neck at the level 2 and level 3.  No evidence of contralateral cervical adenopathy and no evidence of metastatic disease elsewhere   Bone marrow biopsy  showed no evidence of lymphoma. IPI score 0- low risk. Does not meet criteria for CNS prophylaxis   Patient finished 3 cycles of RCHOP on 02/27/18.PET showed no active disease. She then completed IFRT and is in CR1  Interval history-since her last visit she was evaluated for chest pain in the ED and work-up was unremarkable.  No additional episodes.  Today she reports feeling well.  Denies any new lumps or bumps, unintentional weight loss or night sweats.  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   ECOG PS- 0 Pain scale- 0   Review of systems- Review of Systems  Constitutional: Negative.  Negative for chills, fever, malaise/fatigue and weight loss.  HENT:  Negative for congestion, ear pain and tinnitus.   Eyes: Negative.  Negative for blurred vision and double vision.  Respiratory: Negative.  Negative for cough, sputum production and shortness of breath.   Cardiovascular: Negative.  Negative for chest pain, palpitations and leg swelling.  Gastrointestinal: Negative.  Negative for abdominal pain, constipation, diarrhea, nausea and vomiting.  Genitourinary:  Negative for dysuria, frequency and urgency.  Musculoskeletal:  Negative for back pain and falls.  Skin: Negative.  Negative for rash.  Neurological: Negative.  Negative for weakness and headaches.  Endo/Heme/Allergies: Negative.  Does not bruise/bleed easily.  Psychiatric/Behavioral: Negative.  Negative for depression. The patient is not nervous/anxious and does not have insomnia.     No Known Allergies   Past Medical History:  Diagnosis Date   BRCA gene mutation negative 07/10/2016   MyRisk neg   Breast cancer ([place]) 2009   T1c (1.2 cm) N0  ER 90%, PR 90%, HER-2/neu not amplified. Oncotype DX score, low risk (14) 9% chance of recurrence with antiestrogen therapy alone.   Breast cancer ([place]) 06-17-16   INVASIVE MAMMARY CARCINOMA . T1, ER positive, PR positive, HER-2/neu not overexpressed    Diffuse large B-cell lymphoma ([place]) 11/2017   Right cervical adenopathy   Family history of adverse reaction to anesthesia    PTS MOM-HARD TO WAKE UP   Heart murmur    History of basal cell carcinoma 02/11/2011   right upper back   History of dysplastic nevus 06/20/2011   left scapula   Personal history of chemotherapy 2019   lymphoma   Personal history of radiation therapy 2009   right breast cancer   Personal history of radiation therapy 2017   right breast cancer   Radiation 2009   BREAST CA   Skin cancer      Past Surgical History:  Procedure Laterality Date   APPENDECTOMY     BREAST BIOPSY Right 06/17/2016   invasive. T1, ER positive, PR positive, HER-2/neu not overexpressed   BREAST CYST ASPIRATION Left 2002   BREAST EXCISIONAL BIOPSY Right 06/2016   lumpectomy with rad    BREAST EXCISIONAL BIOPSY Right 2009   breast ca + mammo site radiation   BREAST LUMPECTOMY Right 2017   invasive mammary NEG margins   BREAST LUMPECTOMY Right 2009   mammosite   BREAST LUMPECTOMY WITH NEEDLE LOCALIZATION Right 07/18/2016   Procedure: BREAST LUMPECTOMY WITH NEEDLE LOCALIZATION AND RIGHT AXILLARY EXPLORATION;  Surgeon: Robert Bellow, MD;  Location: [facility] ORS;  Service: General;  Laterality: Right;   BREAST SURGERY Right July 2009   Wide excision, sentinel left node biopsy MammoSite. ( 06/21/2008   CHOLECYSTECTOMY  1984   COLONOSCOPY WITH PROPOFOL N/A 10/13/2018   Procedure: COLONOSCOPY WITH PROPOFOL;  Surgeon: Lucilla Lame, MD;  Location: [facility] ENDOSCOPY;  Service: Endoscopy;  Laterality: N/A;   DILATION AND CURETTAGE OF UTERUS  2004   ENDOMETRIAL ABLATION  2005   IR FLUORO GUIDE PORT INSERTION RIGHT  01/15/2018   TUMOR REMOVAL  12/2013   from uterus done by [facility]    Social History   Socioeconomic History   Marital status: Married    Spouse name: [name]   Number of children: 2   Years of education: 12   Highest education level: Not on file  Occupational History    Occupation: unemployed  Tobacco Use   Smoking status: Never   Smokeless tobacco: Never  Vaping Use   Vaping Use: Never used  Substance and Sexual Activity   Alcohol use: No   Drug use: No   Sexual activity: Yes    Birth control/protection: Post-menopausal  Other Topics Concern   Not on file  Social History Narrative   Not on file   Social Determinants of Health   Financial Resource Strain: Not on file  Food Insecurity: Not on file  Transportation Needs: Not on file  Physical Activity: Not on file  Stress: Not on file  Social Connections: Not on file  Intimate Partner Violence: Not on file    Family History  Problem Relation Age of Onset   CAD Mother    Osteoporosis Mother    Lung cancer Father    Diabetes Father    Hypertension Brother    Ovarian cancer Maternal Grandmother    Heart attack Paternal Grandmother    Diabetes Paternal Grandfather    Fibromyalgia Brother    Hypertension Brother    Hypertension  Brother    Sleep apnea Brother    Healthy Brother    Sleep apnea Brother    Ovarian cancer Other    Breast cancer Neg Hx      Current Outpatient Medications:    CALCIUM-MAGNESIUM-VITAMIN D ER PO, Take 1 tablet by mouth daily., Disp: , Rfl:    cetirizine (ZYRTEC) 10 MG tablet, Take 10 mg daily by mouth., Disp: , Rfl:    fluticasone (FLONASE) 50 MCG/ACT nasal spray, USE 2 SPRAYS INTO BOTH NOSTRILS DAILY, Disp: 16 g, Rfl: 5   meloxicam (MOBIC) 15 MG tablet, Take 15 mg by mouth daily., Disp: , Rfl:    Multiple Vitamin (MULTIVITAMIN) capsule, Take 1 capsule by mouth daily., Disp: , Rfl:    Omega-3 Krill Oil 500 MG CAPS, Take 1,000 mg by mouth daily. , Disp: , Rfl:    cyclobenzaprine (FLEXERIL) 10 MG tablet, Take 10 mg by mouth 3 (three) times daily as needed for muscle spasms. (Patient not taking: Reported on 08/16/2021), Disp: , Rfl:   Physical exam:  Vitals:   09/18/21 1411  BP: 97/64  Pulse: 77  Resp: 20  Temp: (!) 97.3 F (36.3 C)  TempSrc: Tympanic   Weight: 177 lb 9.6 oz (80.6 kg)   Physical Exam Constitutional:      Appearance: Normal appearance.  HENT:     Head: Normocephalic and atraumatic.  Eyes:     Pupils: Pupils are equal, round, and reactive to light.  Cardiovascular:     Rate and Rhythm: Normal rate and regular rhythm.     Heart sounds: Normal heart sounds. No murmur heard. Pulmonary:     Effort: Pulmonary effort is normal.     Breath sounds: Normal breath sounds. No wheezing.  Abdominal:     General: Bowel sounds are normal. There is no distension.     Palpations: Abdomen is soft.     Tenderness: There is no abdominal tenderness.  Musculoskeletal:        General: Normal range of motion.     Cervical back: Normal range of motion.  Skin:    General: Skin is warm and dry.     Findings: No rash.  Neurological:     Mental Status: She is alert and oriented to person, place, and time.  Psychiatric:        Judgment: Judgment normal.     CMP Latest Ref Rng & Units 09/18/2021  Glucose 70 - 99 mg/dL 109(H)  BUN 8 - 23 mg/dL 14  Creatinine 0.44 - 1.00 mg/dL 0.70  Sodium 135 - 145 mmol/L 138  Potassium 3.5 - 5.1 mmol/L 4.6  Chloride 98 - 111 mmol/L 100  CO2 22 - 32 mmol/L 31  Calcium 8.9 - 10.3 mg/dL 9.7  Total Protein 6.5 - 8.1 g/dL 7.3  Total Bilirubin 0.3 - 1.2 mg/dL 0.6  Alkaline Phos 38 - 126 U/L 82  AST 15 - 41 U/L 22  ALT 0 - 44 U/L 29   CBC Latest Ref Rng & Units 09/18/2021  WBC 4.0 - 10.5 K/uL 5.5  Hemoglobin 12.0 - 15.0 g/dL 14.0  Hematocrit 36.0 - 46.0 % 43.9  Platelets 150 - 400 K/uL 170      Assessment and plan- Patient is a 65 y.o. female with history of stage I diffuse large B-cell lymphoma s/p 3 cycles of R-CHOP chemotherapy and IMRT currently in CR 1.    Patient is now 3 years out from diagnosis of her diffuse large B-cell lymphoma.  Clinically she  is doing well without any concerning signs and symptoms of recurrence.  Labs from today are unremarkable.  No role for routine surveillance  imaging at this time.  Plan is for her to return to clinic in 6 months with repeat labs (CBC with differential, CMP and LDH) and assessment with Dr. Janese Banks.  I spent 25 minutes dedicated to the care of this patient (face-to-face and non-face-to-face) on the date of the encounter to include what is described in the assessment and plan.   Visit Diagnosis 1. Diffuse large B-cell lymphoma of lymph nodes of neck ([place])    Faythe Casa, NP 09/18/2021 2:33 PM

## 2021-09-18 NOTE — Progress Notes (Signed)
Patient here for follow up she has no concerns or questions today.

## 2021-10-04 ENCOUNTER — Encounter: Payer: Self-pay | Admitting: Family Medicine

## 2021-10-15 ENCOUNTER — Ambulatory Visit (INDEPENDENT_AMBULATORY_CARE_PROVIDER_SITE_OTHER): Payer: Managed Care, Other (non HMO) | Admitting: Family Medicine

## 2021-10-15 ENCOUNTER — Other Ambulatory Visit: Payer: Self-pay

## 2021-10-15 DIAGNOSIS — Z23 Encounter for immunization: Secondary | ICD-10-CM

## 2021-10-23 ENCOUNTER — Other Ambulatory Visit: Payer: Self-pay

## 2021-10-23 ENCOUNTER — Encounter: Payer: Self-pay | Admitting: Family Medicine

## 2021-10-23 ENCOUNTER — Ambulatory Visit (INDEPENDENT_AMBULATORY_CARE_PROVIDER_SITE_OTHER): Payer: Managed Care, Other (non HMO) | Admitting: Family Medicine

## 2021-10-23 VITALS — BP 113/67 | HR 77 | Temp 98.0°F | Ht 62.0 in | Wt 180.8 lb

## 2021-10-23 DIAGNOSIS — E038 Other specified hypothyroidism: Secondary | ICD-10-CM | POA: Diagnosis not present

## 2021-10-23 DIAGNOSIS — Z1231 Encounter for screening mammogram for malignant neoplasm of breast: Secondary | ICD-10-CM

## 2021-10-23 DIAGNOSIS — E782 Mixed hyperlipidemia: Secondary | ICD-10-CM

## 2021-10-23 DIAGNOSIS — Z0000 Encounter for general adult medical examination without abnormal findings: Secondary | ICD-10-CM | POA: Diagnosis not present

## 2021-10-23 DIAGNOSIS — Z6833 Body mass index (BMI) 33.0-33.9, adult: Secondary | ICD-10-CM

## 2021-10-23 DIAGNOSIS — E669 Obesity, unspecified: Secondary | ICD-10-CM | POA: Diagnosis not present

## 2021-10-23 NOTE — Progress Notes (Signed)
Complete physical exam   Patient: Victoria Benson   DOB: 11/29/1956   65 y.o. Female  MRN: [id number] Visit Date: 10/23/2021  Today's healthcare provider: Lavon Paganini, MD   Chief Complaint  Patient presents with   Annual Exam   Subjective    Victoria Benson is a 65 y.o. female who presents today for a complete physical exam.  She reports consuming a general and and watches sodium inake  diet. The patient does not participate in regular exercise at present. She generally feels well. She reports sleeping well. She does not have additional problems to discuss today.  HPI  Paitent reports she will be having pre-op testing done on the 2nd, Labs on the 11th and total knee replacement surgery on the 15th on her left knee  Past Medical History:  Diagnosis Date   BRCA gene mutation negative 07/10/2016   MyRisk neg   Breast cancer ([place]) 2009   T1c (1.2 cm) N0 ER 90%, PR 90%, HER-2/neu not amplified. Oncotype DX score, low risk (14) 9% chance of recurrence with antiestrogen therapy alone.   Breast cancer ([place]) 06-17-16   INVASIVE MAMMARY CARCINOMA . T1, ER positive, PR positive, HER-2/neu not overexpressed   Diffuse large B-cell lymphoma ([place]) 11/2017   Right cervical adenopathy   Family history of adverse reaction to anesthesia    PTS MOM-HARD TO WAKE UP   Heart murmur    History of basal cell carcinoma 02/11/2011   right upper back   History of dysplastic nevus 06/20/2011   left scapula   Personal history of chemotherapy 2019   lymphoma   Personal history of radiation therapy 2009   right breast cancer   Personal history of radiation therapy 2017   right breast cancer   Radiation 2009   BREAST CA   Skin cancer    Past Surgical History:  Procedure Laterality Date   APPENDECTOMY     BREAST BIOPSY Right 06/17/2016   invasive. T1, ER positive, PR positive, HER-2/neu not overexpressed   BREAST CYST ASPIRATION Left 2002   BREAST EXCISIONAL BIOPSY Right 06/2016   lumpectomy  with rad    BREAST EXCISIONAL BIOPSY Right 2009   breast ca + mammo site radiation   BREAST LUMPECTOMY Right 2017   invasive mammary NEG margins   BREAST LUMPECTOMY Right 2009   mammosite   BREAST LUMPECTOMY WITH NEEDLE LOCALIZATION Right 07/18/2016   Procedure: BREAST LUMPECTOMY WITH NEEDLE LOCALIZATION AND RIGHT AXILLARY EXPLORATION;  Surgeon: Robert Bellow, MD;  Location: [facility] ORS;  Service: General;  Laterality: Right;   BREAST SURGERY Right July 2009   Wide excision, sentinel left node biopsy MammoSite. ( 06/21/2008   CHOLECYSTECTOMY  1984   COLONOSCOPY WITH PROPOFOL N/A 10/13/2018   Procedure: COLONOSCOPY WITH PROPOFOL;  Surgeon: Lucilla Lame, MD;  Location: [facility] ENDOSCOPY;  Service: Endoscopy;  Laterality: N/A;   DILATION AND CURETTAGE OF UTERUS  2004   ENDOMETRIAL ABLATION  2005   IR FLUORO GUIDE PORT INSERTION RIGHT  01/15/2018   TUMOR REMOVAL  12/2013   from uterus done by [facility]   Social History   Socioeconomic History   Marital status: Married    Spouse name: [name]   Number of children: 2   Years of education: 12   Highest education level: Not on file  Occupational History   Occupation: unemployed  Tobacco Use   Smoking status: Never   Smokeless tobacco: Never  Vaping Use   Vaping Use: Never  used  Substance and Sexual Activity   Alcohol use: No   Drug use: No   Sexual activity: Yes    Birth control/protection: Post-menopausal  Other Topics Concern   Not on file  Social History Narrative   Not on file   Social Determinants of Health   Financial Resource Strain: Not on file  Food Insecurity: Not on file  Transportation Needs: Not on file  Physical Activity: Not on file  Stress: Not on file  Social Connections: Not on file  Intimate Partner Violence: Not on file   Family Status  Relation Name Status   Mother  Alive   Father  Deceased       CA   Brother 1 Alive   MGM  Deceased at age 64       CA   [place]  Deceased   PGF  Deceased   Brother 2  Alive   Brother 3 Alive   Brother 4 Alive   MGF  Deceased at age 42       old age   Other Maternal Great Grandmoth (Not Specified)   Neg Hx  (Not Specified)   Family History  Problem Relation Age of Onset   CAD Mother    Osteoporosis Mother    Lung cancer Father    Diabetes Father    Hypertension Brother    Ovarian cancer Maternal Grandmother    Heart attack Paternal Grandmother    Diabetes Paternal Grandfather    Fibromyalgia Brother    Hypertension Brother    Hypertension Brother    Sleep apnea Brother    Healthy Brother    Sleep apnea Brother    Ovarian cancer Other    Breast cancer Neg Hx    No Known Allergies  Patient Care Team: Rayfield Beem, Dionne Bucy, MD as PCP - General (Family Medicine) Margarita Rana, MD as Referring Physician (Family Medicine) Bary Castilla, Forest Gleason, MD (General Surgery) Sindy Guadeloupe, MD as Consulting Physician (Oncology) Noreene Filbert, MD as Referring Physician (Radiation Oncology)   Medications: Outpatient Medications Prior to Visit  Medication Sig   CALCIUM-MAGNESIUM-VITAMIN D ER PO Take 1 tablet by mouth daily.   cetirizine (ZYRTEC) 10 MG tablet Take 10 mg daily by mouth.   fluticasone (FLONASE) 50 MCG/ACT nasal spray USE 2 SPRAYS INTO BOTH NOSTRILS DAILY   meloxicam (MOBIC) 15 MG tablet Take 15 mg by mouth daily.   Multiple Vitamin (MULTIVITAMIN) capsule Take 1 capsule by mouth daily.   Omega-3 Krill Oil 500 MG CAPS Take 1,000 mg by mouth daily.    [DISCONTINUED] cyclobenzaprine (FLEXERIL) 10 MG tablet Take 10 mg by mouth 3 (three) times daily as needed for muscle spasms. (Patient not taking: Reported on 08/16/2021)   No facility-administered medications prior to visit.    Review of Systems  All other systems reviewed and are negative.  Last CBC Lab Results  Component Value Date   WBC 5.5 09/18/2021   HGB 14.0 09/18/2021   HCT 43.9 09/18/2021   MCV 92.4 09/18/2021   MCH 29.5 09/18/2021   RDW 12.6 09/18/2021   PLT 170 29/79/8921    Last metabolic panel Lab Results  Component Value Date   GLUCOSE 109 (H) 09/18/2021   NA 138 09/18/2021   K 4.6 09/18/2021   CL 100 09/18/2021   CO2 31 09/18/2021   BUN 14 09/18/2021   CREATININE 0.70 09/18/2021   GFRNONAA >60 09/18/2021   CALCIUM 9.7 09/18/2021   PHOS 2.7 01/08/2018   PROT 7.3 09/18/2021  ALBUMIN 4.4 09/18/2021   LABGLOB 2.0 08/16/2021   AGRATIO 2.2 08/16/2021   BILITOT 0.6 09/18/2021   ALKPHOS 82 09/18/2021   AST 22 09/18/2021   ALT 29 09/18/2021   ANIONGAP 7 09/18/2021   Last lipids Lab Results  Component Value Date   CHOL 215 (H) 09/29/2020   HDL 49 09/29/2020   LDLCALC 128 (H) 09/29/2020   TRIG 217 (H) 09/29/2020   CHOLHDL 3.9 09/28/2019   Last hemoglobin A1c Lab Results  Component Value Date   HGBA1C 5.1 08/16/2021   Last thyroid functions Lab Results  Component Value Date   TSH 4.970 (H) 08/16/2021   Last vitamin D Lab Results  Component Value Date   VD25OH 42.5 09/29/2020   Last vitamin B12 and Folate No results found for: VITAMINB12, FOLATE    Objective    BP 113/67 (BP Location: Right Arm, Patient Position: Sitting, Cuff Size: Normal)   Pulse 77   Temp 98 F (36.7 C) (Oral)   Ht 5' 2"  (1.575 m)   Wt 180 lb 12.8 oz (82 kg)   SpO2 100%   BMI 33.07 kg/m  BP Readings from Last 3 Encounters:  10/23/21 113/67  09/18/21 97/64  08/16/21 102/64   Wt Readings from Last 3 Encounters:  10/23/21 180 lb 12.8 oz (82 kg)  09/18/21 177 lb 9.6 oz (80.6 kg)  08/16/21 175 lb 4.8 oz (79.5 kg)      Physical Exam Vitals reviewed.  Constitutional:      General: She is not in acute distress.    Appearance: Normal appearance. She is well-developed. She is not diaphoretic.  HENT:     Head: Normocephalic and atraumatic.     Right Ear: Tympanic membrane, ear canal and external ear normal.     Left Ear: Tympanic membrane, ear canal and external ear normal.     Nose: Nose normal.     Mouth/Throat:     Mouth: Mucous membranes are  moist.     Pharynx: Oropharynx is clear. No oropharyngeal exudate.  Eyes:     General: No scleral icterus.    Conjunctiva/sclera: Conjunctivae normal.     Pupils: Pupils are equal, round, and reactive to light.  Neck:     Thyroid: No thyromegaly.  Cardiovascular:     Rate and Rhythm: Normal rate and regular rhythm.     Pulses: Normal pulses.     Heart sounds: Normal heart sounds. No murmur heard. Pulmonary:     Effort: Pulmonary effort is normal. No respiratory distress.     Breath sounds: Normal breath sounds. No wheezing or rales.  Abdominal:     General: There is no distension.     Palpations: Abdomen is soft.     Tenderness: There is no abdominal tenderness.  Musculoskeletal:        General: No deformity.     Cervical back: Neck supple.     Right lower leg: No edema.     Left lower leg: No edema.  Lymphadenopathy:     Cervical: No cervical adenopathy.  Skin:    General: Skin is warm and dry.     Findings: No rash.  Neurological:     Mental Status: She is alert and oriented to person, place, and time. Mental status is at baseline.     Sensory: No sensory deficit.     Motor: No weakness.     Gait: Gait normal.  Psychiatric:        Mood and Affect: Mood normal.  Behavior: Behavior normal.        Thought Content: Thought content normal.      Last depression screening scores PHQ 2/9 Scores 10/23/2021 05/15/2021 09/29/2020  PHQ - 2 Score 0 1 0  PHQ- 9 Score 0 3 0   Last fall risk screening Fall Risk  10/23/2021  Falls in the past year? 0  Number falls in past yr: 0  Injury with Fall? 0  Risk for fall due to : No Fall Risks  Follow up -   Last Audit-C alcohol use screening Alcohol Use Disorder Test (AUDIT) 10/23/2021  1. How often do you have a drink containing alcohol? 0  2. How many drinks containing alcohol do you have on a typical day when you are drinking? 0  3. How often do you have six or more drinks on one occasion? 0  AUDIT-C Score 0  Alcohol  Brief Interventions/Follow-up -   A score of 3 or more in women, and 4 or more in men indicates increased risk for alcohol abuse, EXCEPT if all of the points are from question 1   No results found for any visits on 10/23/21.  Assessment & Plan    Routine Health Maintenance and Physical Exam  Exercise Activities and Dietary recommendations  Goals   None     Immunization History  Administered Date(s) Administered   Influenza Split 09/10/2011, 12/11/2012   Influenza,inj,Quad PF,6+ Mos 09/25/2018, 09/28/2019, 09/29/2020, 10/15/2021   Influenza,inj,quad, With Preservative 12/19/2017   Influenza-Unspecified 12/04/2015   Moderna Sars-Covid-2 Vaccination 03/02/2020, 03/30/2020   Td 07/10/1999, 09/28/2019   Tdap 04/12/2009   Zoster Recombinat (Shingrix) 09/28/2019, 11/30/2019    Health Maintenance  Topic Date Due   Pneumococcal Vaccine 67-70 Years old (1 - PCV) Never done   COVID-19 Vaccine (3 - Moderna risk series) 04/27/2020   PAP SMEAR-Modifier  03/21/2022   MAMMOGRAM  08/03/2022   COLONOSCOPY (Pts 45-7yr Insurance coverage will need to be confirmed)  10/13/2028   TETANUS/TDAP  09/27/2029   INFLUENZA VACCINE  Completed   Hepatitis C Screening  Completed   HIV Screening  Completed   Zoster Vaccines- Shingrix  Completed   HPV VACCINES  Aged Out    Discussed health benefits of physical activity, and encouraged her to engage in regular exercise appropriate for her age and condition.  Problem List Items Addressed This Visit       Endocrine   Subclinical hypothyroidism    Reviewed last TSH - well controlled Not on medications        Other   HLD (hyperlipidemia)    Reviewed last lipid panel Not currently on a statin Recheck FLP and CMP at next visit Discussed diet and exercise       Class 1 obesity without serious comorbidity with body mass index (BMI) of 33.0 to 33.9 in adult    Discussed importance of healthy weight management Discussed diet and exercise        Other Visit Diagnoses     Encounter for annual physical exam    -  Primary   Screening mammogram for breast cancer       Relevant Orders   MM 3D SCREEN BREAST BILATERAL        Return in about 6 months (around 04/23/2022) for chronic disease f/u.     I, ALavon Paganini MD, have reviewed all documentation for this visit. The documentation on 10/23/21 for the exam, diagnosis, procedures, and orders are all accurate and complete.   BVirginia Crews  MD, MPH [facility]

## 2021-10-23 NOTE — Assessment & Plan Note (Signed)
Reviewed last TSH - well controlled Not on medications

## 2021-10-23 NOTE — Assessment & Plan Note (Signed)
Discussed importance of healthy weight management Discussed diet and exercise  

## 2021-10-23 NOTE — Assessment & Plan Note (Signed)
Reviewed last lipid panel Not currently on a statin Recheck FLP and CMP at next visit Discussed diet and exercise

## 2021-10-26 ENCOUNTER — Other Ambulatory Visit: Payer: Self-pay

## 2021-10-26 ENCOUNTER — Ambulatory Visit
Admission: RE | Admit: 2021-10-26 | Discharge: 2021-10-26 | Disposition: A | Discharge status: Home / Self Care | Payer: Managed Care, Other (non HMO) | Source: Ambulatory Visit | Attending: Family Medicine | Admitting: Family Medicine

## 2021-10-26 DIAGNOSIS — Z1231 Encounter for screening mammogram for malignant neoplasm of breast: Secondary | ICD-10-CM | POA: Insufficient documentation

## 2021-10-29 ENCOUNTER — Other Ambulatory Visit: Payer: Self-pay | Admitting: Orthopedic Surgery

## 2021-10-29 DIAGNOSIS — Z01818 Encounter for other preprocedural examination: Secondary | ICD-10-CM

## 2021-10-31 ENCOUNTER — Other Ambulatory Visit: Payer: Self-pay

## 2021-10-31 ENCOUNTER — Encounter
Admission: RE | Admit: 2021-10-31 | Discharge: 2021-10-31 | Disposition: A | Discharge status: Home / Self Care | Payer: Managed Care, Other (non HMO) | Source: Ambulatory Visit | Attending: Orthopedic Surgery | Admitting: Orthopedic Surgery

## 2021-10-31 DIAGNOSIS — Z01818 Encounter for other preprocedural examination: Secondary | ICD-10-CM | POA: Diagnosis present

## 2021-10-31 HISTORY — DX: Other specified postprocedural states: Z98.890

## 2021-10-31 HISTORY — DX: Unspecified osteoarthritis, unspecified site: M19.90

## 2021-10-31 HISTORY — DX: Nausea with vomiting, unspecified: R11.2

## 2021-10-31 LAB — URINALYSIS, COMPLETE (UACMP) WITH MICROSCOPIC
Bacteria, UA: NONE SEEN
Bilirubin Urine: NEGATIVE
Glucose, UA: NEGATIVE mg/dL
Hgb urine dipstick: NEGATIVE
Ketones, ur: NEGATIVE mg/dL
Leukocytes,Ua: NEGATIVE
Nitrite: NEGATIVE
Protein, ur: NEGATIVE mg/dL
Specific Gravity, Urine: 1.008 (ref 1.005–1.030)
Squamous Epithelial / HPF: NONE SEEN (ref 0–5)
pH: 7 (ref 5.0–8.0)

## 2021-10-31 LAB — PROTIME-INR
INR: 1 (ref 0.8–1.2)
Prothrombin Time: 13.4 seconds (ref 11.4–15.2)

## 2021-10-31 LAB — APTT: aPTT: 30 seconds (ref 24–36)

## 2021-10-31 LAB — SURGICAL PCR SCREEN
MRSA, PCR: NEGATIVE
Staphylococcus aureus: NEGATIVE

## 2021-10-31 LAB — TYPE AND SCREEN
ABO/RH(D): B POS
Antibody Screen: NEGATIVE

## 2021-10-31 NOTE — Patient Instructions (Addendum)
Your procedure is scheduled on: Tuesday 11/13/21 Report to the Registration Desk on the 1st floor of the [facility]. To find out your arrival time, please call [phone] between 1PM - 3PM on: Monday 11/12/21  REMEMBER: Instructions that are not followed completely may result in serious medical risk, up to and including death; or upon the discretion of your surgeon and anesthesiologist your surgery may need to be rescheduled.  Do not eat food/drink after midnight the night before surgery.  No gum chewing, lozengers or hard candies.  TAKE THESE MEDICATIONS THE MORNING OF SURGERY WITH A SIP OF WATER: None  One week prior to surgery: Stop (MOBIC) 7.5 MG tablet and any other meloxicam Anti-inflammatories (NSAIDS) such as  Advil, Aleve, Ibuprofen, Motrin, Naproxen, Naprosyn and Aspirin based products such as Excedrin, Goodys Powder, BC Powder.  Stop Calcium Carb-Cholecalciferol (CALCIUM 600/VITAMIN D PO), Multiple Vitamin (MULTIVITAMIN) capsule, Omega-3 Krill Oil 500 MG CAPS, ANY OVER THE COUNTER supplements until after surgery.  Stop cetirizine (ZYRTEC) 10 MG tablet and fluticasone (FLONASE) 50 MCG/ACT nasal spray if your not symptomatic.  You may however, continue to take Tylenol if needed for pain up until the day of surgery.  No Alcohol for 24 hours before or after surgery.  No Smoking including e-cigarettes for 24 hours prior to surgery.  No chewable tobacco products for at least 6 hours prior to surgery.  No nicotine patches on the day of surgery.  Do not use any "recreational" drugs for at least a week prior to your surgery.  Please be advised that the combination of cocaine and anesthesia may have negative outcomes, up to and including death. If you test positive for cocaine, your surgery will be cancelled.  On the morning of surgery brush your teeth with toothpaste and water, you may rinse your mouth with mouthwash if you wish. Do not swallow any toothpaste or  mouthwash.  Use CHG wipes as directed on instruction sheet.  Do not wear jewelry, make-up, hairpins, clips or nail polish.  Do not wear lotions, powders, or perfumes.   Do not shave body from the neck down 48 hours prior to surgery just in case you cut yourself which could leave a site for infection.  Also, freshly shaved skin may become irritated if using the CHG soap.  Hearing aids may not be worn into surgery.  Do not bring valuables to the hospital. [facility] is not responsible for any missing/lost belongings or valuables.   Notify your doctor if there is any change in your medical condition (cold, fever, infection).  Wear comfortable clothing (specific to your surgery type) to the hospital.  After surgery, you can help prevent lung complications by doing breathing exercises.  Take deep breaths and cough every 1-2 hours.   If you are being admitted to the hospital overnight, leave your suitcase in the car. After surgery it may be brought to your room.  You will need a responsible adult (18 years or older) to drive you home and stay with you that night.   If you are taking public transportation, you will need to have a responsible adult (18 years or older) with you. Please confirm with your physician that it is acceptable to use public transportation.   Please call the [facility] at [phone] if you have any questions about these instructions.  Surgery Visitation Policy:  Patients undergoing a surgery or procedure may have one family member or support person with them as long as that person  is not COVID-19 positive or experiencing its symptoms.  That person may remain in the waiting area during the procedure and may rotate out with other people.  Inpatient Visitation:    Visiting hours are 7 a.m. to 8 p.m. Up to two visitors ages 16+ are allowed at one time in a patient room. The visitors may rotate out with other people during the day. Visitors  must check out when they leave, or other visitors will not be allowed. One designated support person may remain overnight. The visitor must pass COVID-19 screenings, use hand sanitizer when entering and exiting the patient's room and wear a mask at all times, including in the patient's room. Patients must also wear a mask when staff or their visitor are in the room. Masking is required regardless of vaccination status.

## 2021-11-09 ENCOUNTER — Other Ambulatory Visit
Admission: RE | Admit: 2021-11-09 | Discharge: 2021-11-09 | Disposition: A | Discharge status: Home / Self Care | Payer: Managed Care, Other (non HMO) | Source: Ambulatory Visit | Attending: Orthopedic Surgery | Admitting: Orthopedic Surgery

## 2021-11-09 ENCOUNTER — Other Ambulatory Visit: Payer: Self-pay

## 2021-11-09 DIAGNOSIS — Z20822 Contact with and (suspected) exposure to covid-19: Secondary | ICD-10-CM | POA: Insufficient documentation

## 2021-11-09 DIAGNOSIS — Z01812 Encounter for preprocedural laboratory examination: Secondary | ICD-10-CM | POA: Insufficient documentation

## 2021-11-09 LAB — SARS CORONAVIRUS 2 (TAT 6-24 HRS): SARS Coronavirus 2: NEGATIVE

## 2021-11-13 ENCOUNTER — Other Ambulatory Visit: Payer: Self-pay

## 2021-11-13 ENCOUNTER — Inpatient Hospital Stay
Admission: AD | Admit: 2021-11-13 | Discharge: 2021-11-15 | DRG: 470 | Disposition: A | Discharge status: Home / Self Care | Payer: Managed Care, Other (non HMO) | Attending: Orthopedic Surgery | Admitting: Orthopedic Surgery

## 2021-11-13 ENCOUNTER — Ambulatory Visit: Payer: Managed Care, Other (non HMO) | Admitting: Anesthesiology

## 2021-11-13 ENCOUNTER — Encounter: Payer: Self-pay | Admitting: Orthopedic Surgery

## 2021-11-13 ENCOUNTER — Observation Stay: Payer: Managed Care, Other (non HMO)

## 2021-11-13 ENCOUNTER — Encounter
Admission: AD | Disposition: A | Discharge status: Home / Self Care | Payer: Self-pay | Source: Home / Self Care | Attending: Orthopedic Surgery

## 2021-11-13 DIAGNOSIS — Z923 Personal history of irradiation: Secondary | ICD-10-CM

## 2021-11-13 DIAGNOSIS — R0789 Other chest pain: Secondary | ICD-10-CM

## 2021-11-13 DIAGNOSIS — I959 Hypotension, unspecified: Secondary | ICD-10-CM | POA: Diagnosis not present

## 2021-11-13 DIAGNOSIS — Z79899 Other long term (current) drug therapy: Secondary | ICD-10-CM

## 2021-11-13 DIAGNOSIS — Z9221 Personal history of antineoplastic chemotherapy: Secondary | ICD-10-CM

## 2021-11-13 DIAGNOSIS — I4729 Other ventricular tachycardia: Secondary | ICD-10-CM | POA: Diagnosis not present

## 2021-11-13 DIAGNOSIS — I9581 Postprocedural hypotension: Secondary | ICD-10-CM | POA: Diagnosis not present

## 2021-11-13 DIAGNOSIS — I951 Orthostatic hypotension: Secondary | ICD-10-CM | POA: Diagnosis present

## 2021-11-13 DIAGNOSIS — Z96652 Presence of left artificial knee joint: Secondary | ICD-10-CM

## 2021-11-13 DIAGNOSIS — I472 Ventricular tachycardia, unspecified: Secondary | ICD-10-CM | POA: Diagnosis present

## 2021-11-13 DIAGNOSIS — Z791 Long term (current) use of non-steroidal anti-inflammatories (NSAID): Secondary | ICD-10-CM

## 2021-11-13 DIAGNOSIS — E038 Other specified hypothyroidism: Secondary | ICD-10-CM | POA: Diagnosis not present

## 2021-11-13 DIAGNOSIS — E669 Obesity, unspecified: Secondary | ICD-10-CM | POA: Diagnosis present

## 2021-11-13 DIAGNOSIS — Z8572 Personal history of non-Hodgkin lymphomas: Secondary | ICD-10-CM

## 2021-11-13 DIAGNOSIS — Z853 Personal history of malignant neoplasm of breast: Secondary | ICD-10-CM

## 2021-11-13 DIAGNOSIS — Z9049 Acquired absence of other specified parts of digestive tract: Secondary | ICD-10-CM

## 2021-11-13 DIAGNOSIS — Z6832 Body mass index (BMI) 32.0-32.9, adult: Secondary | ICD-10-CM

## 2021-11-13 DIAGNOSIS — Z85828 Personal history of other malignant neoplasm of skin: Secondary | ICD-10-CM

## 2021-11-13 DIAGNOSIS — M1712 Unilateral primary osteoarthritis, left knee: Principal | ICD-10-CM | POA: Diagnosis present

## 2021-11-13 HISTORY — PX: TOTAL KNEE ARTHROPLASTY: SHX125

## 2021-11-13 LAB — CBC
HCT: 35.5 % — ABNORMAL LOW (ref 36.0–46.0)
Hemoglobin: 11.5 g/dL — ABNORMAL LOW (ref 12.0–15.0)
MCH: 30.2 pg (ref 26.0–34.0)
MCHC: 32.4 g/dL (ref 30.0–36.0)
MCV: 93.2 fL (ref 80.0–100.0)
Platelets: 126 10*3/uL — ABNORMAL LOW (ref 150–400)
RBC: 3.81 MIL/uL — ABNORMAL LOW (ref 3.87–5.11)
RDW: 13 % (ref 11.5–15.5)
WBC: 9.1 10*3/uL (ref 4.0–10.5)
nRBC: 0 % (ref 0.0–0.2)

## 2021-11-13 LAB — COMPREHENSIVE METABOLIC PANEL
ALT: 19 U/L (ref 0–44)
AST: 20 U/L (ref 15–41)
Albumin: 3 g/dL — ABNORMAL LOW (ref 3.5–5.0)
Alkaline Phosphatase: 59 U/L (ref 38–126)
Anion gap: 7 (ref 5–15)
BUN: 7 mg/dL — ABNORMAL LOW (ref 8–23)
CO2: 23 mmol/L (ref 22–32)
Calcium: 8.4 mg/dL — ABNORMAL LOW (ref 8.9–10.3)
Chloride: 108 mmol/L (ref 98–111)
Creatinine, Ser: 0.47 mg/dL (ref 0.44–1.00)
GFR, Estimated: >60 mL/min (ref >60)
Glucose, Bld: 136 mg/dL — ABNORMAL HIGH (ref 70–99)
Potassium: 3.9 mmol/L (ref 3.5–5.1)
Sodium: 138 mmol/L (ref 135–145)
Total Bilirubin: 0.8 mg/dL (ref 0.3–1.2)
Total Protein: 5.2 g/dL — ABNORMAL LOW (ref 6.5–8.1)

## 2021-11-13 LAB — BRAIN NATRIURETIC PEPTIDE: B Natriuretic Peptide: 109 pg/mL — ABNORMAL HIGH (ref 0.0–100.0)

## 2021-11-13 LAB — ABO/RH: ABO/RH(D): B POS

## 2021-11-13 LAB — TSH: TSH: 2.184 u[IU]/mL (ref 0.350–4.500)

## 2021-11-13 LAB — TROPONIN I (HIGH SENSITIVITY): Troponin I (High Sensitivity): <2 ng/L (ref <18)

## 2021-11-13 LAB — HEMOGLOBIN AND HEMATOCRIT, BLOOD
HCT: 35 % — ABNORMAL LOW (ref 36.0–46.0)
Hemoglobin: 11.4 g/dL — ABNORMAL LOW (ref 12.0–15.0)

## 2021-11-13 LAB — GLUCOSE, CAPILLARY: Glucose-Capillary: 116 mg/dL — ABNORMAL HIGH (ref 70–99)

## 2021-11-13 LAB — T4, FREE: Free T4: 0.76 ng/dL (ref 0.61–1.12)

## 2021-11-13 SURGERY — ARTHROPLASTY, KNEE, TOTAL
Anesthesia: Spinal | Site: Knee | Laterality: Left

## 2021-11-13 MED ORDER — KETOROLAC TROMETHAMINE 30 MG/ML IJ SOLN
INTRAMUSCULAR | Status: AC
  Filled 2021-11-13: qty 1

## 2021-11-13 MED ORDER — CEFAZOLIN SODIUM-DEXTROSE 2-4 GM/100ML-% IV SOLN
2.0000 g | INTRAVENOUS | Status: AC
  Administered 2021-11-13: 2 g via INTRAVENOUS

## 2021-11-13 MED ORDER — LORATADINE 10 MG PO TABS
10.0000 mg | ORAL_TABLET | Freq: Every day | ORAL | Status: DC
  Administered 2021-11-13 – 2021-11-15 (×3): 10 mg via ORAL
  Filled 2021-11-13 (×4): qty 1

## 2021-11-13 MED ORDER — ACETAMINOPHEN 325 MG PO TABS
325.0000 mg | ORAL_TABLET | Freq: Four times a day (QID) | ORAL | Status: DC | PRN
  Administered 2021-11-14: 650 mg via ORAL
  Filled 2021-11-13: qty 2

## 2021-11-13 MED ORDER — PHENYLEPHRINE HCL (PRESSORS) 10 MG/ML IV SOLN
INTRAVENOUS | Status: DC | PRN
  Administered 2021-11-13 (×3): 100 ug via INTRAVENOUS

## 2021-11-13 MED ORDER — SODIUM CHLORIDE 0.9 % IV BOLUS
500.0000 mL | Freq: Once | INTRAVENOUS | Status: AC
  Administered 2021-11-13: 500 mL via INTRAVENOUS

## 2021-11-13 MED ORDER — OXYCODONE HCL 5 MG PO TABS
5.0000 mg | ORAL_TABLET | ORAL | Status: DC | PRN
  Administered 2021-11-13 (×2): 5 mg via ORAL
  Administered 2021-11-14 (×2): 10 mg via ORAL
  Filled 2021-11-13 (×2): qty 2

## 2021-11-13 MED ORDER — ONDANSETRON HCL 4 MG/2ML IJ SOLN
INTRAMUSCULAR | Status: DC | PRN
  Administered 2021-11-13: 4 mg via INTRAVENOUS

## 2021-11-13 MED ORDER — ONDANSETRON HCL 4 MG/2ML IJ SOLN: 4.0000 mg | Freq: Four times a day (QID) | INTRAMUSCULAR | Status: DC | PRN

## 2021-11-13 MED ORDER — CEFAZOLIN SODIUM-DEXTROSE 2-4 GM/100ML-% IV SOLN
INTRAVENOUS | Status: AC
  Filled 2021-11-13: qty 100

## 2021-11-13 MED ORDER — BISACODYL 5 MG PO TBEC
5.0000 mg | DELAYED_RELEASE_TABLET | Freq: Every day | ORAL | Status: DC | PRN
  Filled 2021-11-13: qty 1

## 2021-11-13 MED ORDER — ACETAMINOPHEN 500 MG PO TABS
1000.0000 mg | ORAL_TABLET | Freq: Four times a day (QID) | ORAL | Status: AC
  Administered 2021-11-14 (×2): 1000 mg via ORAL
  Filled 2021-11-13 (×3): qty 2

## 2021-11-13 MED ORDER — PROPOFOL 1000 MG/100ML IV EMUL
INTRAVENOUS | Status: AC
  Filled 2021-11-13: qty 100

## 2021-11-13 MED ORDER — NEOMYCIN-POLYMYXIN B GU 40-200000 IR SOLN
Status: AC
  Filled 2021-11-13: qty 20

## 2021-11-13 MED ORDER — BUPIVACAINE-EPINEPHRINE (PF) 0.25% -1:200000 IJ SOLN
INTRAMUSCULAR | Status: AC
  Filled 2021-11-13: qty 30

## 2021-11-13 MED ORDER — CLINDAMYCIN PHOSPHATE 600 MG/50ML IV SOLN
INTRAVENOUS | Status: AC
  Filled 2021-11-13: qty 50

## 2021-11-13 MED ORDER — PROPOFOL 500 MG/50ML IV EMUL
INTRAVENOUS | Status: DC | PRN
  Administered 2021-11-13: 100 ug/kg/min via INTRAVENOUS

## 2021-11-13 MED ORDER — TRANEXAMIC ACID 1000 MG/10ML IV SOLN
INTRAVENOUS | Status: AC
  Filled 2021-11-13: qty 10

## 2021-11-13 MED ORDER — FENTANYL CITRATE (PF) 100 MCG/2ML IJ SOLN
INTRAMUSCULAR | Status: DC | PRN
  Administered 2021-11-13: 25 ug via INTRAVENOUS
  Administered 2021-11-13: 50 ug via INTRAVENOUS
  Administered 2021-11-13: 25 ug via INTRAVENOUS

## 2021-11-13 MED ORDER — TRAMADOL HCL 50 MG PO TABS
50.0000 mg | ORAL_TABLET | Freq: Four times a day (QID) | ORAL | Status: DC
  Administered 2021-11-13: 50 mg via ORAL
  Filled 2021-11-13: qty 1

## 2021-11-13 MED ORDER — ONDANSETRON HCL 4 MG/2ML IJ SOLN
INTRAMUSCULAR | Status: AC
  Filled 2021-11-13: qty 2

## 2021-11-13 MED ORDER — CHLORHEXIDINE GLUCONATE CLOTH 2 % EX PADS: 6.0000 | MEDICATED_PAD | Freq: Once | CUTANEOUS | Status: DC

## 2021-11-13 MED ORDER — HYDROCODONE-ACETAMINOPHEN 5-325 MG PO TABS: 1.0000 | ORAL_TABLET | Freq: Once | ORAL | Status: DC | PRN

## 2021-11-13 MED ORDER — CEFAZOLIN SODIUM-DEXTROSE 2-4 GM/100ML-% IV SOLN: 2.0000 g | Freq: Four times a day (QID) | INTRAVENOUS | Status: AC

## 2021-11-13 MED ORDER — GLYCOPYRROLATE 0.2 MG/ML IJ SOLN
INTRAMUSCULAR | Status: DC | PRN
  Administered 2021-11-13: .2 mg via INTRAVENOUS

## 2021-11-13 MED ORDER — ENOXAPARIN SODIUM 30 MG/0.3ML IJ SOSY
30.0000 mg | PREFILLED_SYRINGE | INTRAMUSCULAR | Status: DC
  Filled 2021-11-13: qty 0.3

## 2021-11-13 MED ORDER — BUPIVACAINE LIPOSOME 1.3 % IJ SUSP
INTRAMUSCULAR | Status: AC
  Filled 2021-11-13: qty 20

## 2021-11-13 MED ORDER — MORPHINE SULFATE (PF) 4 MG/ML IV SOLN
INTRAVENOUS | Status: AC
  Filled 2021-11-13: qty 1

## 2021-11-13 MED ORDER — FLUTICASONE PROPIONATE 50 MCG/ACT NA SUSP
1.0000 | Freq: Every day | NASAL | Status: DC
  Administered 2021-11-13 – 2021-11-15 (×2): 1 via NASAL
  Filled 2021-11-13: qty 16

## 2021-11-13 MED ORDER — APREPITANT 40 MG PO CAPS
ORAL_CAPSULE | ORAL | Status: AC
  Administered 2021-11-13: 40 mg via ORAL
  Filled 2021-11-13: qty 1

## 2021-11-13 MED ORDER — PANTOPRAZOLE SODIUM 40 MG IV SOLR
40.0000 mg | Freq: Two times a day (BID) | INTRAVENOUS | Status: DC
  Administered 2021-11-13 – 2021-11-14 (×2): 40 mg via INTRAVENOUS
  Filled 2021-11-13 (×4): qty 40

## 2021-11-13 MED ORDER — TRANEXAMIC ACID-NACL 1000-0.7 MG/100ML-% IV SOLN
1000.0000 mg | INTRAVENOUS | Status: AC
  Administered 2021-11-13: 1000 mg via INTRAVENOUS

## 2021-11-13 MED ORDER — FLEET ENEMA 7-19 GM/118ML RE ENEM: 1.0000 | ENEMA | Freq: Once | RECTAL | Status: DC | PRN

## 2021-11-13 MED ORDER — SODIUM CHLORIDE 0.9 % IV SOLN
INTRAVENOUS | Status: DC | PRN
  Administered 2021-11-13: 60 mL

## 2021-11-13 MED ORDER — CHLORHEXIDINE GLUCONATE 0.12 % MT SOLN: 15.0000 mL | Freq: Once | OROMUCOSAL | Status: AC

## 2021-11-13 MED ORDER — ADULT MULTIVITAMIN W/MINERALS CH
1.0000 | ORAL_TABLET | Freq: Every day | ORAL | Status: DC
Start: 2021-11-13 — End: 2021-11-16
  Administered 2021-11-13 – 2021-11-15 (×3): 1 via ORAL
  Filled 2021-11-13 (×4): qty 1

## 2021-11-13 MED ORDER — MIDAZOLAM HCL 2 MG/2ML IJ SOLN
INTRAMUSCULAR | Status: AC
  Filled 2021-11-13: qty 2

## 2021-11-13 MED ORDER — ACETAMINOPHEN 500 MG PO TABS
ORAL_TABLET | ORAL | Status: AC
  Administered 2021-11-13: 1000 mg via ORAL
  Filled 2021-11-13: qty 2

## 2021-11-13 MED ORDER — FENTANYL CITRATE (PF) 100 MCG/2ML IJ SOLN
INTRAMUSCULAR | Status: AC
  Filled 2021-11-13: qty 2

## 2021-11-13 MED ORDER — PHENYLEPHRINE HCL-NACL 20-0.9 MG/250ML-% IV SOLN
INTRAVENOUS | Status: DC | PRN
  Administered 2021-11-13: 50 ug/min via INTRAVENOUS

## 2021-11-13 MED ORDER — ACETAMINOPHEN 500 MG PO TABS: 1000.0000 mg | ORAL_TABLET | ORAL | Status: AC

## 2021-11-13 MED ORDER — ONDANSETRON HCL 4 MG PO TABS: 4.0000 mg | ORAL_TABLET | Freq: Four times a day (QID) | ORAL | Status: DC | PRN

## 2021-11-13 MED ORDER — APREPITANT 40 MG PO CAPS: 40.0000 mg | ORAL_CAPSULE | Freq: Once | ORAL | Status: AC

## 2021-11-13 MED ORDER — PHENYLEPHRINE HCL (PRESSORS) 10 MG/ML IV SOLN
INTRAVENOUS | Status: AC
  Filled 2021-11-13: qty 1

## 2021-11-13 MED ORDER — MORPHINE SULFATE 4 MG/ML IJ SOLN
INTRAMUSCULAR | Status: DC | PRN
  Administered 2021-11-13: 62 mL

## 2021-11-13 MED ORDER — METHOCARBAMOL 500 MG PO TABS
500.0000 mg | ORAL_TABLET | Freq: Four times a day (QID) | ORAL | Status: DC | PRN
  Filled 2021-11-13: qty 1

## 2021-11-13 MED ORDER — ORAL CARE MOUTH RINSE: 15.0000 mL | Freq: Once | OROMUCOSAL | Status: AC

## 2021-11-13 MED ORDER — ALUM & MAG HYDROXIDE-SIMETH 200-200-20 MG/5ML PO SUSP: 30.0000 mL | ORAL | Status: DC | PRN

## 2021-11-13 MED ORDER — LACTATED RINGERS IV SOLN: INTRAVENOUS | Status: DC

## 2021-11-13 MED ORDER — SODIUM CHLORIDE 0.9 % IR SOLN
Status: DC | PRN
  Administered 2021-11-13: 1004 mL

## 2021-11-13 MED ORDER — CHLORHEXIDINE GLUCONATE 0.12 % MT SOLN
OROMUCOSAL | Status: AC
  Administered 2021-11-13: 15 mL via OROMUCOSAL
  Filled 2021-11-13: qty 15

## 2021-11-13 MED ORDER — ACETAMINOPHEN 500 MG PO TABS
ORAL_TABLET | ORAL | Status: AC
  Filled 2021-11-13: qty 1

## 2021-11-13 MED ORDER — DOCUSATE SODIUM 100 MG PO CAPS
100.0000 mg | ORAL_CAPSULE | Freq: Two times a day (BID) | ORAL | Status: DC
  Administered 2021-11-13 – 2021-11-15 (×4): 100 mg via ORAL
  Filled 2021-11-13 (×6): qty 1

## 2021-11-13 MED ORDER — SODIUM CHLORIDE FLUSH 0.9 % IV SOLN
INTRAVENOUS | Status: AC
  Filled 2021-11-13: qty 40

## 2021-11-13 MED ORDER — SENNOSIDES-DOCUSATE SODIUM 8.6-50 MG PO TABS
1.0000 | ORAL_TABLET | Freq: Every evening | ORAL | Status: DC | PRN
  Filled 2021-11-13: qty 1

## 2021-11-13 MED ORDER — ONDANSETRON HCL 4 MG/2ML IJ SOLN
INTRAMUSCULAR | Status: AC
  Administered 2021-11-13: 4 mg via INTRAVENOUS
  Filled 2021-11-13: qty 2

## 2021-11-13 MED ORDER — OXYCODONE HCL 5 MG PO TABS
ORAL_TABLET | ORAL | Status: AC
  Filled 2021-11-13: qty 1

## 2021-11-13 MED ORDER — CHLORHEXIDINE GLUCONATE CLOTH 2 % EX PADS
6.0000 | MEDICATED_PAD | Freq: Once | CUTANEOUS | Status: AC
  Administered 2021-11-13: 6 via TOPICAL

## 2021-11-13 MED ORDER — TRANEXAMIC ACID-NACL 1000-0.7 MG/100ML-% IV SOLN
INTRAVENOUS | Status: AC
  Filled 2021-11-13: qty 100

## 2021-11-13 MED ORDER — SODIUM CHLORIDE 0.9 % IR SOLN
Status: DC | PRN
  Administered 2021-11-13: 3012 mL

## 2021-11-13 MED ORDER — BUPIVACAINE HCL (PF) 0.5 % IJ SOLN
INTRAMUSCULAR | Status: DC | PRN
  Administered 2021-11-13: 3 mL

## 2021-11-13 MED ORDER — HYDROMORPHONE HCL 1 MG/ML IJ SOLN: 0.2500 mg | INTRAMUSCULAR | Status: DC | PRN

## 2021-11-13 MED ORDER — CLINDAMYCIN PHOSPHATE 600 MG/50ML IV SOLN
600.0000 mg | Freq: Once | INTRAVENOUS | Status: AC
  Administered 2021-11-13: 600 mg via INTRAVENOUS

## 2021-11-13 MED ORDER — CEFAZOLIN SODIUM-DEXTROSE 2-4 GM/100ML-% IV SOLN
INTRAVENOUS | Status: AC
  Administered 2021-11-13: 2 g via INTRAVENOUS
  Filled 2021-11-13: qty 100

## 2021-11-13 MED ORDER — SODIUM CHLORIDE 0.9 % IV BOLUS
250.0000 mL | Freq: Once | INTRAVENOUS | Status: AC
  Administered 2021-11-13: 250 mL via INTRAVENOUS

## 2021-11-13 MED ORDER — METHOCARBAMOL 1000 MG/10ML IJ SOLN
500.0000 mg | Freq: Four times a day (QID) | INTRAVENOUS | Status: DC | PRN
  Filled 2021-11-13: qty 5

## 2021-11-13 MED ORDER — DIPHENHYDRAMINE HCL 12.5 MG/5ML PO ELIX
12.5000 mg | ORAL_SOLUTION | ORAL | Status: DC | PRN
  Filled 2021-11-13: qty 10

## 2021-11-13 MED ORDER — MIDAZOLAM HCL 5 MG/5ML IJ SOLN
INTRAMUSCULAR | Status: DC | PRN
Start: 2021-11-13 — End: 2021-11-13
  Administered 2021-11-13 (×2): 1 mg via INTRAVENOUS

## 2021-11-13 MED ORDER — POLYVINYL ALCOHOL 1.4 % OP SOLN
1.0000 [drp] | OPHTHALMIC | Status: DC | PRN
  Administered 2021-11-13: 1 [drp] via OPHTHALMIC
  Filled 2021-11-13: qty 15

## 2021-11-13 MED ORDER — HYDROMORPHONE HCL 1 MG/ML IJ SOLN: 0.5000 mg | INTRAMUSCULAR | Status: DC | PRN

## 2021-11-13 MED ORDER — FAMOTIDINE 20 MG PO TABS: 20.0000 mg | ORAL_TABLET | Freq: Once | ORAL | Status: AC

## 2021-11-13 MED ORDER — FAMOTIDINE 20 MG PO TABS
ORAL_TABLET | ORAL | Status: AC
  Administered 2021-11-13: 20 mg via ORAL
  Filled 2021-11-13: qty 1

## 2021-11-13 MED ORDER — TRAMADOL HCL 50 MG PO TABS
ORAL_TABLET | ORAL | Status: AC
  Filled 2021-11-13: qty 1

## 2021-11-13 MED ORDER — SODIUM CHLORIDE 0.9 % IV SOLN: INTRAVENOUS | Status: DC

## 2021-11-13 MED ORDER — OYSTER SHELL CALCIUM/D3 500-5 MG-MCG PO TABS
1.0000 | ORAL_TABLET | Freq: Every day | ORAL | Status: DC
  Administered 2021-11-13 – 2021-11-15 (×3): 1 via ORAL
  Filled 2021-11-13 (×4): qty 1

## 2021-11-13 SURGICAL SUPPLY — 73 items
BLADE SAW 90X13X1.19 OSCILLAT (BLADE) ×3 IMPLANT
BLADE SAW 90X25X1.19 OSCILLAT (BLADE) ×3 IMPLANT
CEMENT HV SMART SET (Cement) ×6 IMPLANT
CEMENT TIBIA MBT SIZE 2.5 (Knees) ×1 IMPLANT
CNTNR SPEC 2.5X3XGRAD LEK (MISCELLANEOUS) ×1
CONT SPEC 4OZ STER OR WHT (MISCELLANEOUS) ×2
CONTAINER SPEC 2.5X3XGRAD LEK (MISCELLANEOUS) ×1 IMPLANT
COOLER POLAR GLACIER W/PUMP (MISCELLANEOUS) ×3 IMPLANT
CUFF TOURN SGL QUICK 24 (TOURNIQUET CUFF)
CUFF TOURN SGL QUICK 34 (TOURNIQUET CUFF)
CUFF TRNQT CYL 24X4X16.5-23 (TOURNIQUET CUFF) IMPLANT
CUFF TRNQT CYL 34X4.125X (TOURNIQUET CUFF) IMPLANT
DRAPE 3/4 80X56 (DRAPES) ×6 IMPLANT
DRAPE IMP U-DRAPE 54X76 (DRAPES) ×6 IMPLANT
DRAPE INCISE IOBAN 66X60 STRL (DRAPES) ×3 IMPLANT
DRAPE SURG 17X11 SM STRL (DRAPES) ×6 IMPLANT
DRSG AQUACEL AG ADV 3.5X10 (GAUZE/BANDAGES/DRESSINGS) ×3 IMPLANT
DRSG MEPILEX SACRM 8.7X9.8 (GAUZE/BANDAGES/DRESSINGS) ×3 IMPLANT
DURAPREP 26ML APPLICATOR (WOUND CARE) ×12 IMPLANT
ELECT REM PT RETURN 9FT ADLT (ELECTROSURGICAL) ×3
ELECTRODE REM PT RTRN 9FT ADLT (ELECTROSURGICAL) ×1 IMPLANT
GAUZE 4X4 16PLY ~~LOC~~+RFID DBL (SPONGE) ×3 IMPLANT
GAUZE SPONGE 4X4 12PLY STRL (GAUZE/BANDAGES/DRESSINGS) ×3 IMPLANT
GLOVE SRG 8 PF TXTR STRL LF DI (GLOVE) ×2 IMPLANT
GLOVE SURG ENC TEXT LTX SZ7.5 (GLOVE) ×3 IMPLANT
GLOVE SURG ORTHO LTX SZ9 (GLOVE) ×6 IMPLANT
GLOVE SURG SYN 7.5  E (GLOVE) ×2
GLOVE SURG SYN 7.5 E (GLOVE) ×1 IMPLANT
GLOVE SURG UNDER POLY LF SZ8 (GLOVE) ×4
GLOVE SURG UNDER POLY LF SZ9 (GLOVE) ×3 IMPLANT
GOWN STRL REUS TWL 2XL XL LVL4 (GOWN DISPOSABLE) ×3 IMPLANT
GOWN STRL REUS W/ TWL LRG LVL3 (GOWN DISPOSABLE) ×1 IMPLANT
GOWN STRL REUS W/ TWL LRG LVL4 (GOWN DISPOSABLE) ×1 IMPLANT
GOWN STRL REUS W/ TWL XL LVL3 (GOWN DISPOSABLE) ×1 IMPLANT
GOWN STRL REUS W/TWL LRG LVL3 (GOWN DISPOSABLE) ×2
GOWN STRL REUS W/TWL LRG LVL4 (GOWN DISPOSABLE) ×2
GOWN STRL REUS W/TWL XL LVL3 (GOWN DISPOSABLE) ×2
HOLDER FOLEY CATH W/STRAP (MISCELLANEOUS) ×3 IMPLANT
IMMBOLIZER KNEE 19 BLUE UNIV (SOFTGOODS) ×3 IMPLANT
IMPL FEMUR SIGMA LT PS SZ 3 (Knees) ×1 IMPLANT
IMPLANT FEMUR SIGMA LT PS SZ 3 (Knees) ×3 IMPLANT
INSERT TIBIAL PFC SIG SZ3 10MM (Knees) ×3 IMPLANT
IV NS IRRIG 3000ML ARTHROMATIC (IV SOLUTION) ×3 IMPLANT
KIT TURNOVER KIT A (KITS) ×3 IMPLANT
MANIFOLD NEPTUNE II (INSTRUMENTS) ×6 IMPLANT
NDL SAFETY ECLIPSE 18X1.5 (NEEDLE) ×1 IMPLANT
NEEDLE HYPO 18GX1.5 SHARP (NEEDLE) ×2
NEEDLE HYPO 22GX1.5 SAFETY (NEEDLE) ×3 IMPLANT
NEEDLE SPNL 20GX3.5 QUINCKE YW (NEEDLE) ×3 IMPLANT
NS IRRIG 1000ML POUR BTL (IV SOLUTION) ×3 IMPLANT
PACK TOTAL KNEE (MISCELLANEOUS) ×3 IMPLANT
PAD WRAPON POLAR KNEE (MISCELLANEOUS) ×1 IMPLANT
PATELLA DOME PFC 32MM (Knees) ×3 IMPLANT
PENCIL SMOKE EVACUATOR COATED (MISCELLANEOUS) ×3 IMPLANT
PIN DRILL FIX HALF THREAD (BIT) ×6 IMPLANT
PIN DRILL QUICK PACK ×24 IMPLANT
PULSAVAC PLUS IRRIG FAN TIP (DISPOSABLE) ×3
SPONGE T-LAP 18X18 ~~LOC~~+RFID (SPONGE) ×9 IMPLANT
STAPLER SKIN PROX 35W (STAPLE) ×3 IMPLANT
SUCTION FRAZIER HANDLE 10FR (MISCELLANEOUS) ×2
SUCTION TUBE FRAZIER 10FR DISP (MISCELLANEOUS) ×1 IMPLANT
SUT ETHIBOND NAB CT1 #1 30IN (SUTURE) ×6 IMPLANT
SUT VIC AB 0 CT1 36 (SUTURE) ×3 IMPLANT
SUT VIC AB 2-0 CT1 (SUTURE) ×6 IMPLANT
SYR 20ML LL LF (SYRINGE) ×3 IMPLANT
SYR 30ML LL (SYRINGE) ×6 IMPLANT
TIBIA MBT CEMENT SIZE 2.5 (Knees) ×3 IMPLANT
TIP FAN IRRIG PULSAVAC PLUS (DISPOSABLE) ×1 IMPLANT
TOWER CARTRIDGE SMART MIX (DISPOSABLE) ×3 IMPLANT
TRAY FOLEY MTR SLVR 16FR STAT (SET/KITS/TRAYS/PACK) ×3 IMPLANT
TUBE SUCT KAM VAC (TUBING) ×3 IMPLANT
WATER STERILE IRR 500ML POUR (IV SOLUTION) ×3 IMPLANT
WRAPON POLAR PAD KNEE (MISCELLANEOUS) ×3

## 2021-11-13 NOTE — Anesthesia Postprocedure Evaluation (Addendum)
Anesthesia Post Note  Patient: Victoria Benson  Procedure(s) Performed: LEFT TOTAL KNEE ARTHROPLASTY (Left: Knee)  Patient location during evaluation: PACU Anesthesia Type: Spinal Level of consciousness: oriented and awake and alert Pain management: pain level controlled Vital Signs Assessment: post-procedure vital signs reviewed and stable Respiratory status: spontaneous breathing, respiratory function stable and patient connected to nasal cannula oxygen Cardiovascular status: blood pressure returned to baseline and stable Postop Assessment: no headache, no backache and no apparent nausea or vomiting Anesthetic complications: no Comments: Update: Post-pacu discharge- Pt had a code event called due to a syncopal episode. Pt only complained of nausea before and after the loss of consciousness. Her BP was stable low around a map of 65. EKG was benign. She reports having a similar episode in march or April this year that was not linking with a continued diagnosis. Previous medications and Labs were reviewed. She was given 500cc IVF. This is being treated as a vagal event.    No notable events documented.   Last Vitals:  Vitals:   11/13/21 1145 11/13/21 1150  BP:    Pulse: 72 78  Resp: 13 13  Temp:    SpO2: 99% 96%    Last Pain:  Vitals:   11/13/21 1115  TempSrc:   PainSc: 0-No pain                 Margaree Mackintosh

## 2021-11-13 NOTE — Progress Notes (Signed)
  Subjective:  POST OP CHECK s/p left total knee arthroplasty.  A medical alert was called for the patient when she had a syncopal episode with physical therapy.  Patient was felt to have lost consciousness due to orthostatic hypotension.  I checked on the patient after [place] in the OR.  The patient was resting comfortably and was responsive.  Patient complained of mild nausea.   Objective:   VITALS:   Vitals:   11/13/21 1519 11/13/21 1523 11/13/21 1524 11/13/21 1537  BP: (!) 177/144 (!) 99/49 (!) 96/52 (!) 100/47  Pulse:    61  Resp:    15  Temp:      TempSrc:      SpO2: 93% 95% 98% 95%  Weight:      Height:        PHYSICAL EXAM: Left lower extremity: Patient is able to straight leg raise her left lower extremity while her Polar Care and knee immobilizer repositioned. Neurovascular intact Sensation intact distally Intact pulses distally Dorsiflexion/Plantar flexion intact Incision: dressing C/D/I No cellulitis present Compartment soft  LABS  Results for orders placed or performed during the hospital encounter of 11/13/21 (from the past 24 hour(s))  ABO/Rh     Status: None   Collection Time: 11/13/21  7:48 AM  Result Value Ref Range   ABO/RH(D)      B POS Performed at [facility], [address] [place], [place] [address]   Glucose, capillary     Status: Abnormal   Collection Time: 11/13/21  3:20 PM  Result Value Ref Range   Glucose-Capillary 116 (H) 70 - 99 mg/dL    DG Knee Left Port  Result Date: 11/13/2021 CLINICAL DATA:  Status post left knee replacement EXAM: PORTABLE LEFT KNEE - 1-2 VIEW COMPARISON:  None. FINDINGS: The patient is status post left total knee arthroplasty. Hardware alignment is within expected limits, without evidence of complication. There is expected surrounding soft tissue swelling and soft tissue gas. IMPRESSION: Status post left knee arthroplasty without evidence of complication. Electronically Signed   By: Valetta Mole M.D.    On: 11/13/2021 11:29    Assessment/Plan: Day of Surgery   Active Problems:   S/P total knee arthroplasty, left  Patient currently stable after vasovagal reaction/orthostatic hypotension.  Continue cardiovascular monitoring.  Continue physical therapy as the patient can tolerate.  Continue current pain management.   Thornton Park , MD 11/13/2021, 4:07 PM

## 2021-11-13 NOTE — Progress Notes (Signed)
  Subjective:  POST OP CHECK s/p left total knee arthroplasty.   Patient reports left knee pain as mild.  Patient's husband is at the bedside.  She has no other complaints.  Objective:   VITALS:   Vitals:   11/13/21 1300 11/13/21 1305 11/13/21 1310 11/13/21 1315  BP: (!) 88/48  (!) 91/49   Pulse: 65 68 70 63  Resp: 14 15 14 13   Temp:    97.6 F (36.4 C)  TempSrc:      SpO2: 95% 97% 100% 99%  Weight:      Height:        PHYSICAL EXAM: Left lower extremity: Neurovascular intact Sensation intact distally Intact pulses distally Dorsiflexion/Plantar flexion intact Incision: dressing C/D/I No cellulitis present Compartment soft  LABS  Results for orders placed or performed during the hospital encounter of 11/13/21 (from the past 24 hour(s))  ABO/Rh     Status: None   Collection Time: 11/13/21  7:48 AM  Result Value Ref Range   ABO/RH(D)      B POS Performed at [facility], [address], [place], [place] [address]     DG Knee Left Port  Result Date: 11/13/2021 CLINICAL DATA:  Status post left knee replacement EXAM: PORTABLE LEFT KNEE - 1-2 VIEW COMPARISON:  None. FINDINGS: The patient is status post left total knee arthroplasty. Hardware alignment is within expected limits, without evidence of complication. There is expected surrounding soft tissue swelling and soft tissue gas. IMPRESSION: Status post left knee arthroplasty without evidence of complication. Electronically Signed   By: Valetta Mole M.D.   On: 11/13/2021 11:29    Assessment/Plan: Day of Surgery   Active Problems:   S/P total knee arthroplasty, left  Patient is stable postop.  Her pain is currently controlled.  Postoperative x-rays demonstrate the total knee arthroplasty components are well-positioned and there is no evidence of postop complication.  Patient will continue 24 hours of postop antibiotics.  Her Foley catheter will be removed in the morning.  Patient will have a.m. labs.  She will  begin physical therapy this afternoon and continue tomorrow.  Patient may be discharged tomorrow if her pain is controlled and she clears physical therapy.    Thornton Park , MD 11/13/2021, 2:08 PM

## 2021-11-13 NOTE — Progress Notes (Signed)
Messaged on call surgeon again about low blood pressures. Awaiting response/orders

## 2021-11-13 NOTE — Progress Notes (Signed)
Patient having consistently low blood pressures. Currently laying in bed with eyes closed, NAD. 86/47 blood pressure. Messaged on call surgeon, awaiting response/orders.

## 2021-11-13 NOTE — Op Note (Addendum)
DATE OF SURGERY:  11/13/2021 TIME: 10:41 AM  PATIENT NAME:  Victoria Benson   AGE: 65 y.o.    PRE-OPERATIVE DIAGNOSIS:  Left Knee Osteoarthritis  POST-OPERATIVE DIAGNOSIS:  Same  PROCEDURE:  Procedure(s): LEFT TOTAL KNEE ARTHROPLASTY  SURGEON:  Thornton Park, MD   ASSISTANT:  Roland Rack, PA  OPERATIVE IMPLANTS: Depuy PFC Sigma, Posterior Stabilized Femural component size 3, Tibia size rotating platform component size 2.5, Patella polyethylene 3-peg oval button size 32, with a 10 mm polyethylene insert.  EBL:  50 cc  TOURNIQUET TIME:  77 minutes  PREOPERATIVE INDICATIONS:  Victoria Benson is an 65 y.o. female who has a diagnosis of  Left Knee Osteoarthritis and elected for a left total knee arthroplasty after failing nonoperative treatment.  Their knee pain significantly impacts their activity of daily living.  Radiographs have demonstrated tricompartmental osteoarthritis joint space narrowing, osteophytes and subchondral sclerosis.  The risks, benefits, and alternatives were discussed at length including but not limited to the risks of infection, bleeding, nerve or blood vessel injury, knee stiffness, fracture, dislocation, loosening or failure of the hardware and the need for further surgery. Medical risks include but not limited to DVT and pulmonary embolism, myocardial infarction, stroke, pneumonia, respiratory failure and death. I discussed these risks with the patient in my office prior to the date of surgery. They understood these risks and were willing to proceed.  OPERATIVE FINDINGS AND UNIQUE ASPECTS OF THE CASE: Tricompartmental osteoarthritis of the left knee  OPERATIVE DESCRIPTION:  The patient was brought to the operative room and placed in a supine position after undergoing placement of a spinal anesthetic.  A Foley catheter was placed.  IV antibiotics were given. Patient received 2 g of Ancef IV, clindamycin 600 mg IV and tranexamic acid IV prior to the incision. The  lower extremity was prepped and draped in the usual sterile fashion.  A time out was performed to verify the patient's name, date of birth, medical record number, correct site of surgery and correct procedure to be performed. The timeout was also used to confirm the patient received antibiotics and that appropriate instruments, implants and radiographs studies were available in the room.  The leg was elevated and exsanguinated with an Esmarch and the tourniquet was inflated to 275 mmHg for 77 minutes..  A midline incision was made over the left knee. Full-thickness skin flaps were developed. A medial parapatellar arthrotomy was then made and the patella everted and the knee was brought into 90 of flexion. Hoffa's fat pad along with the cruciate ligaments and medial and lateral menisci were resected.   The distal femoral intramedullary canal was opened with a drill and the intramedullary distal femoral cutting jig was inserted into the femoral canal pinned into position. It was set at 5 degrees resecting 10 mm off the distal femur.  Care was taken to protect the collateral ligaments during distal femoral resection.  The distal femoral resection was performed with an oscillating saw. The femoral cutting guide was then removed.  The extramedullary tibial cutting guide was then placed using the anterior tibial crest and second ray of the foot as a references.  The tibial cutting guide was adjusted to allow for appropriate posterior slope.  The tibial cutting block was pinned into position. The slotted stylus was used to measure the proximal tibial resection of 10 mm off the high lateral side.  The tibial long rod alignment guide was then used to confirm position of the cutting block. A third  cross pin through the tibial cutting block was then drilled into position to allow for rotational stability. Care was taken during the tibial resection to protect the medial and collateral ligaments.  The resected tibial bone  was removed along with the posterior horns of the menisci.  The PCL was sacrificed.  Extension gap was measured with a spacer block and alignment and extension was confirmed using a long alignment rod.  The attention was then turned back to the femur. The posterior referencing distal femoral sizing guide was applied to the distal femur.  The femur was sized to be a size 3. Rotation of the referencing guide was checked with the epicondylar axis and Whitesides line. Then the 4-in-1 cutting jig was then applied to the distal femur. A stylus was used to confirm that the anterior femur would not be notched.   Then the anterior, posterior and chamfer femoral cuts were then made with an oscillating saw.  The flexion gap was then measured with a flexion spacer block and long alignment rod and was found to be symmetric with the extension gap and perpendicular to mechanical axis of the tibia.  The distal femoral preparation was completed by performing the posterior stabilized box cut using the cutting block. The entry site for the intramedullary femoral guide was filled with autologous bone graft from bone previously resected earlier in the case.  The proximal tibia plateau was then sized with trial trays. The best coverage was achieved with a size 2.5. This tibial tray was then pinned into position. The proximal tibia was then prepared with the reamer and keel punch.  After tibial preparation was completed, all trial components were inserted with polyethylene trials.  The knee was found to have excellent balance and full motion with a size 10 mm tibial polyethylene insert..    The attention was then turned to preparation of the patella. The thickness of the patella was measured with a caliper, the diameter measured with the patella templates.  The patella resection was then made with an oscillating saw using the patella cutting guide.  The final patellar diameter was 32 mm.  3 peg holes for the patella component were  then drilled. The trial patella was then placed. Knee was taken through a full range of motion and deemed to be stable with the trial components. All trial components were then removed.  Left the knee capsule was then injected with Exparel.  The knee joint capsule was injected with a mixture of quarter percent Marcaine, Toradol and morphine to assist with postoperative pain relief.  The joint was copiously irrigated with pulse lavage.  The final total knee arthroplasty components were then cemented into place with a 10 mm trial polyethylene insert and all excess methylmethacrylate was removed.  The joint was again copiously irrigated. After the cement had hardened the knee was again taken through a full range of motion. It was felt to be most stable with the 10 mm tibial polyethylene insert.  The tourniquet was then let down at 77 minutes and all bleeding vessels were cauterized.  The actual tibial polyethylene insert was then placed. The knee was taken through a range of motion and the patella tracked well and the knee was again irrigated copiously.    The medial arthrotomy was closed with #1 Ethibond. The subcutaneous tissue closed with 0 and 2-0 vicryl, and skin approximated with staples.  A dry sterile and compressive dressing was applied.  A Polar Care was applied to the operative knee  along with a knee immobilizer.  The patient was awakened and brought to the PACU in stable and satisfactory condition.  All sharp, lap and instrument counts were correct at the conclusion the case. I spoke with the patient's husband postoperatively to let him know the case had been performed without complication and the patient was stable in recovery room.

## 2021-11-13 NOTE — Anesthesia Procedure Notes (Signed)
Spinal  Patient location during procedure: OR Start time: 11/13/2021 8:09 AM End time: 11/13/2021 8:09 AM Reason for block: surgical anesthesia Staffing Performed: anesthesiologist  Anesthesiologist: Margaree Mackintosh, DO Preanesthetic Checklist Completed: patient identified, IV checked, site marked, risks and benefits discussed, surgical consent, monitors and equipment checked, pre-op evaluation and timeout performed Spinal Block Patient position: sitting Prep: ChloraPrep and site prepped and draped Patient monitoring: heart rate, cardiac monitor, continuous pulse ox and blood pressure Approach: midline Location: L4-5 Injection technique: single-shot Needle Needle type: Pencan  Needle gauge: 24 G Needle length: 9 cm Assessment Sensory level: T6

## 2021-11-13 NOTE — Progress Notes (Addendum)
Per Orthopedic hypotension sidebar, on call physician has been notified without response. NS 250cc bolus given to patient. MEWS is yellow. Patient requesting pain medication with 5/10 pain explained to patient with blood pressure that low pain medication will drop it even lower.   I messaged Dr Mack Guise also at [phone]. No response.   At 2142 Dr Mack Guise responded he is putting an order for the hospitalist to come see patient.

## 2021-11-13 NOTE — H&P (Addendum)
PREOPERATIVE H&P  Chief Complaint: Left Knee Osteoarthritis  HPI: Victoria Benson is a 65 y.o. female who presents for preoperative history and physical with a diagnosis of Left Knee Osteoarthritis. Symptoms of pain and swelling are significantly impairing activities of daily living.  She has failed nonoperative management including corticosteroid and hyaluronic acid injections and physical therapy.  Patient is elected to proceed with a left total knee arthroplasty.  X-ray films demonstrate bone-on-bone contact the medial compartment, subchondral sclerosis and marginal osteophytes.  Past Medical History:  Diagnosis Date   BRCA gene mutation negative 07/10/2016   MyRisk neg   Breast cancer ([place]) 2009   T1c (1.2 cm) N0 ER 90%, PR 90%, HER-2/neu not amplified. Oncotype DX score, low risk (14) 9% chance of recurrence with antiestrogen therapy alone.   Breast cancer ([place]) 06/17/2016   INVASIVE MAMMARY CARCINOMA . T1, ER positive, PR positive, HER-2/neu not overexpressed   Diffuse large B-cell lymphoma ([place]) 11/2017   Right cervical adenopathy   Family history of adverse reaction to anesthesia    PTS MOM-HARD TO WAKE UP   Heart murmur    History of basal cell carcinoma 02/11/2011   right upper back   History of dysplastic nevus 06/20/2011   left scapula   Osteoarthritis    left knee   Personal history of chemotherapy 2019   lymphoma   Personal history of radiation therapy 2009   right breast cancer   Personal history of radiation therapy 2017   right breast cancer   PONV (postoperative nausea and vomiting)    Radiation 2009   BREAST CA   Skin cancer    Past Surgical History:  Procedure Laterality Date   APPENDECTOMY     BREAST BIOPSY Right 06/17/2016   invasive. T1, ER positive, PR positive, HER-2/neu not overexpressed   BREAST CYST ASPIRATION Left 2002   BREAST EXCISIONAL BIOPSY Right 06/2016   lumpectomy with rad    BREAST EXCISIONAL BIOPSY Right 2009   breast ca + mammo site  radiation   BREAST LUMPECTOMY Right 2017   invasive mammary NEG margins   BREAST LUMPECTOMY Right 2009   mammosite   BREAST LUMPECTOMY WITH NEEDLE LOCALIZATION Right 07/18/2016   Procedure: BREAST LUMPECTOMY WITH NEEDLE LOCALIZATION AND RIGHT AXILLARY EXPLORATION;  Surgeon: Robert Bellow, MD;  Location: [facility] ORS;  Service: General;  Laterality: Right;   BREAST SURGERY Right July 2009   Wide excision, sentinel left node biopsy MammoSite. ( 06/21/2008   CHOLECYSTECTOMY  1984   COLONOSCOPY WITH PROPOFOL N/A 10/13/2018   Procedure: COLONOSCOPY WITH PROPOFOL;  Surgeon: Lucilla Lame, MD;  Location: [facility] END[facility];  Service: Endoscopy;  Laterality: N/A;   DILATION AND CURETTAGE OF UTERUS  2004   ENDOMETRIAL ABLATION  2005   IR FLUORO GUIDE PORT INSERTION RIGHT  01/15/2018   TUMOR REMOVAL  12/2013   from uterus done by [facility]   Social History   Socioeconomic History   Marital status: Married    Spouse name: Victoria Benson   Number of children: 2   Years of education: 12   Highest education level: Not on file  Occupational History   Occupation: unemployed  Tobacco Use   Smoking status: Never   Smokeless tobacco: Never  Vaping Use   Vaping Use: Never used  Substance and Sexual Activity   Alcohol use: No   Drug use: No   Sexual activity: Yes    Birth control/protection: Post-menopausal  Other Topics Concern   Not on file  Social  History Narrative   Not on file   Social Determinants of Health   Financial Resource Strain: Not on file  Food Insecurity: Not on file  Transportation Needs: Not on file  Physical Activity: Not on file  Stress: Not on file  Social Connections: Not on file   Family History  Problem Relation Age of Onset   CAD Mother    Osteoporosis Mother    Lung cancer Father    Diabetes Father    Hypertension Brother    Ovarian cancer Maternal Grandmother    Heart attack Paternal Grandmother    Diabetes Paternal Grandfather    Fibromyalgia Brother     Hypertension Brother    Hypertension Brother    Sleep apnea Brother    Healthy Brother    Sleep apnea Brother    Ovarian cancer Other    Breast cancer Neg Hx    No Known Allergies Prior to Admission medications   Medication Sig Start Date End Date Taking? Authorizing Provider  Calcium Carb-Cholecalciferol (CALCIUM 600/VITAMIN D PO) Take 1 tablet by mouth daily.   Yes [provider]  cetirizine (ZYRTEC) 10 MG tablet Take 10 mg daily by mouth.   Yes [provider]  fluticasone (FLONASE) 50 MCG/ACT nasal spray USE 2 SPRAYS INTO BOTH NOSTRILS DAILY Patient taking differently: Place 1 spray into both nostrils daily. 03/28/21  Yes Bacigalupo, Dionne Bucy, MD  meloxicam (MOBIC) 7.5 MG tablet Take 7.5 mg by mouth in the morning and at bedtime.   Yes [provider]  Multiple Vitamin (MULTIVITAMIN) capsule Take 1 capsule by mouth daily.   Yes [provider]  Omega-3 Krill Oil 500 MG CAPS Take 500 mg by mouth daily.   Yes [provider]     Positive ROS: All other systems have been reviewed and were otherwise negative with the exception of those mentioned in the HPI and as above.  Physical Exam: General: Alert, no acute distress Cardiovascular: Regular rate and rhythm, no murmurs rubs or gallops.  No pedal edema Respiratory: Clear to auscultation bilaterally, no wheezes rales or rhonchi. No cyanosis, no use of accessory musculature GI: No organomegaly, abdomen is soft and non-tender nondistended with positive bowel sounds. Skin: Skin intact, no lesions within the operative field. Neurologic: Sensation intact distally Psychiatric: Patient is competent for consent with normal mood and affect Lymphatic: No cervical lymphadenopathy  MUSCULOSKELETAL: Left knee:    Left Knee: The patient's skin is intact. She has no erythema or ecchymosis, but a trace effusion. Her range of motion is from near full extension to approximately 115-120 degrees of flexion.  She has no ligamentous laxity, but has tenderness along the medial joint line and has tenderness over the superolateral knee above her patella. She has patellofemoral crepitus, but no grind or apprehension. There is no ligamentous laxity. Distally, she is neurovascularly intact.   Radiology: Left knee:  The patient has progression of medial joint space narrowing. She has bone-on-bone with subchondral sclerosis in the medial compartment. She has marginal osteophytes in the medial and lateral compartments and has degenerative changes in the patellofemoral joint as well. There is no fracture, dislocation or other osseous abnormality noted.   Assessment: Left Knee Osteoarthritis  Plan: Plan for Procedure(s): LEFT TOTAL KNEE ARTHROPLASTY  I reviewed the details of the operation as well as the postoperative course with the patient.  I signed the left knee according to hospital's correct site of surgery protocol.  A preop H&P was performed at the bedside this  morning.  I reviewed the patient's laboratory studies and x-rays in preparation for this case.  I discussed the risks and benefits of surgery with the patient.  She understands the risks include but are not limited to infection, bleeding requiring blood transfusion, nerve or blood vessel injury, joint stiffness or loss of motion, persistent pain, weakness or instability, failure or loosening of the hardware and the need for further surgery. Medical risks include but are not limited to DVT and pulmonary embolism, myocardial infarction, stroke, pneumonia, respiratory failure and death. Patient understood these risks and wished to proceed.     Thornton Park, MD   11/13/2021 7:52 AM

## 2021-11-13 NOTE — Addendum Note (Signed)
Addendum  created 11/13/21 1729 by Iran Ouch, MD   Clinical Note Signed

## 2021-11-13 NOTE — Anesthesia Preprocedure Evaluation (Signed)
Anesthesia Evaluation  Patient identified by MRN, date of birth, ID band Patient awake    Reviewed: Allergy & Precautions, NPO status , Patient's Chart, lab work & pertinent test results  Airway Mallampati: III  TM Distance: >3 FB Neck ROM: full    Dental   Pulmonary neg pulmonary ROS,    Pulmonary exam normal        Cardiovascular negative cardio ROS Normal cardiovascular exam     Neuro/Psych negative neurological ROS  negative psych ROS   GI/Hepatic negative GI ROS, Neg liver ROS,   Endo/Other  negative endocrine ROS  Renal/GU      Musculoskeletal   Abdominal (+) + obese,   Peds  Hematology negative hematology ROS (+)   Anesthesia Other Findings Past Medical History: 07/10/2016: BRCA gene mutation negative     Comment:  MyRisk neg 2009: Breast cancer ([place])     Comment:  T1c (1.2 cm) N0 ER 90%, PR 90%, HER-2/neu not amplified.              Oncotype DX score, low risk (14) 9% chance of recurrence               with antiestrogen therapy alone. 06/17/2016: Breast cancer ([place])     Comment:  INVASIVE MAMMARY CARCINOMA . T1, ER positive, PR               positive, HER-2/neu not overexpressed 11/2017: Diffuse large B-cell lymphoma ([facility])     Comment:  Right cervical adenopathy No date: Family history of adverse reaction to anesthesia     Comment:  PTS MOM-HARD TO WAKE UP No date: Heart murmur 02/11/2011: History of basal cell carcinoma     Comment:  right upper back 06/20/2011: History of dysplastic nevus     Comment:  left scapula No date: Osteoarthritis     Comment:  left knee 2019: Personal history of chemotherapy     Comment:  lymphoma 2009: Personal history of radiation therapy     Comment:  right breast cancer 2017: Personal history of radiation therapy     Comment:  right breast cancer No date: PONV (postoperative nausea and vomiting) 2009: Radiation     Comment:  BREAST CA No date: Skin  cancer  Past Surgical History: No date: APPENDECTOMY 06/17/2016: BREAST BIOPSY; Right     Comment:  invasive. T1, ER positive, PR positive, HER-2/neu not               overexpressed 2002: BREAST CYST ASPIRATION; Left 06/2016: BREAST EXCISIONAL BIOPSY; Right     Comment:  lumpectomy with rad  2009: BREAST EXCISIONAL BIOPSY; Right     Comment:  breast ca + mammo site radiation 2017: BREAST LUMPECTOMY; Right     Comment:  invasive mammary NEG margins 2009: BREAST LUMPECTOMY; Right     Comment:  mammosite 07/18/2016: BREAST LUMPECTOMY WITH NEEDLE LOCALIZATION; Right     Comment:  Procedure: BREAST LUMPECTOMY WITH NEEDLE LOCALIZATION               AND RIGHT AXILLARY EXPLORATION;  Surgeon: Robert Bellow, MD;  Location: [facility] ORS;  Service: General;                Laterality: Right; July 2009: BREAST SURGERY; Right     Comment:  Wide excision, sentinel left node biopsy MammoSite. (  06/21/2008 1984: CHOLECYSTECTOMY 10/13/2018: COLONOSCOPY WITH PROPOFOL; N/A     Comment:  Procedure: COLONOSCOPY WITH PROPOFOL;  Surgeon: Lucilla Lame, MD;  Location: [facility] END[facility];  Service:               Endoscopy;  Laterality: N/A; 2004: DILATION AND CURETTAGE OF UTERUS 2005: ENDOMETRIAL ABLATION 01/15/2018: IR FLUORO GUIDE PORT INSERTION RIGHT 12/2013: TUMOR REMOVAL     Comment:  from uterus done by [facility]  BMI    Body Mass Index: 32.37 kg/m      Reproductive/Obstetrics negative OB ROS                             Anesthesia Physical Anesthesia Plan  ASA: 3  Anesthesia Plan: Spinal   Post-op Pain Management:    Induction:   PONV Risk Score and Plan:   Airway Management Planned: Natural Airway and Nasal Cannula  Additional Equipment:   Intra-op Plan:   Post-operative Plan:   Informed Consent: I have reviewed the patients History and Physical, chart, labs and discussed the procedure including the risks,  benefits and alternatives for the proposed anesthesia with the patient or authorized representative who has indicated his/her understanding and acceptance.     Dental Advisory Given  Plan Discussed with: Anesthesiologist, CRNA and Surgeon  Anesthesia Plan Comments: (Patient reports no bleeding problems and no anticoagulant use.  Plan for spinal with backup GA  Patient consented for risks of anesthesia including but not limited to:  - adverse reactions to medications - damage to eyes, teeth, lips or other oral mucosa - nerve damage due to positioning  - risk of bleeding, infection and or nerve damage from spinal that could lead to paralysis - risk of headache or failed spinal - damage to teeth, lips or other oral mucosa - sore throat or hoarseness - damage to heart, brain, nerves, lungs, other parts of body or loss of life  Patient voiced understanding.)        Anesthesia Quick Evaluation

## 2021-11-13 NOTE — Transfer of Care (Signed)
Immediate Anesthesia Transfer of Care Note  Patient: Victoria Benson  Procedure(s) Performed: LEFT TOTAL KNEE ARTHROPLASTY (Left: Knee)  Patient Location: PACU  Anesthesia Type:Spinal  Level of Consciousness: awake, alert  and oriented  Airway & Oxygen Therapy: Patient Spontanous Breathing and Patient connected to face mask oxygen  Post-op Assessment: Report given to RN and Post -op Vital signs reviewed and stable  Post vital signs: stable  Last Vitals:  Vitals Value Taken Time  BP 104/48 11/13/21 1045  Temp 36.1 C 11/13/21 1043  Pulse 86 11/13/21 1047  Resp 14 11/13/21 1047  SpO2 100 % 11/13/21 1047  Vitals shown include unvalidated device data.  Last Pain:  Vitals:   11/13/21 0623  TempSrc: Temporal  PainSc: 1          Complications: No notable events documented.

## 2021-11-13 NOTE — Consult Note (Signed)
[facility] Medical Consultation  Victoria Benson YHC:[id number] DOB: September 12, 1956 DOA: 11/13/2021 PCP: Virginia Crews, MD   Requesting physician: Margarito Courser Date of consultation: 11/13/2021 Reason for consultation: Hypotension/ Medical management.   Impression/Recommendations Principal Problem:   Hypotension Active Problems:   Subclinical hypothyroidism   Other chest pain   S/P total knee arthroplasty, left    Hypotension/ medical management.  Patient states that she has had a history of low blood pressure since March of this year.  And primary care happens I started to take  electrolyte and keep hydrated. Manual bp is as well systolic.  Suspect worsening secondary to pain meds and n.p.o. state and anesthesia.  But from the history it sounds like patient's baseline blood pressure since March has been in systolics of 517 and 616'W.  Currently MAP of 64 we will continue with IV fluid management with normal saline at 100 cc/h overnight and monitor.  Low threshold for vasopressor therapy to prevent endorgan damage.  We will obtain a 2D echocardiogram.  2.  Subclinical hypothyroidism: Will obtain a free T4 and TSH.  3.  Other chest pain:  Patient reports that in March when this all started she started to have right-sided chest pain that she thought was musculoskeletal it did resolve from what she remembers for the most part she thought it was musculoskeletal.  We will obtain a CT angio to rule out PE.  4.  Left knee osteoarthritis: Patient is status post knee replacement today. .  Pain medication and PT per orthopedics. Advised patient to refrain from any NSAIDs.    I will followup again tomorrow. Please contact me if I can be of assistance in the meanwhile. Thank you for this consultation.  Chief Complaint: hypotension/medical management consult  HPI:  Patient is a 65 year old Caucasian female admitted for an elective left knee arthroplasty.  Her knee pain from  the arthritis was short-lived.  Patient had 5 arthritis decided to proceed with knee replacement.  Review of systems negative headaches blurred vision gait chest pain palpitation shortness of breath fever abdominal pain bladder issues.  Review of Systems:  ROS is otherwise negative.  Past Medical History:  Diagnosis Date   BRCA gene mutation negative 07/10/2016   MyRisk neg   Breast cancer ([facility]) 2009   T1c (1.2 cm) N0 ER 90%, PR 90%, HER-2/neu not amplified. Oncotype DX score, low risk (14) 9% chance of recurrence with antiestrogen therapy alone.   Breast cancer ([facility]) 06/17/2016   INVASIVE MAMMARY CARCINOMA . T1, ER positive, PR positive, HER-2/neu not overexpressed   Diffuse large B-cell lymphoma ([facility]) 11/2017   Right cervical adenopathy   Family history of adverse reaction to anesthesia    PTS MOM-HARD TO WAKE UP   Heart murmur    History of basal cell carcinoma 02/11/2011   right upper back   History of dysplastic nevus 06/20/2011   left scapula   Osteoarthritis    left knee   Personal history of chemotherapy 2019   lymphoma   Personal history of radiation therapy 2009   right breast cancer   Personal history of radiation therapy 2017   right breast cancer   PONV (postoperative nausea and vomiting)    Radiation 2009   BREAST CA   Skin cancer    Past Surgical History:  Procedure Laterality Date   APPENDECTOMY     BREAST BIOPSY Right 06/17/2016   invasive. T1, ER positive, PR positive, HER-2/neu not overexpressed   BREAST CYST ASPIRATION Left  2002   BREAST EXCISIONAL BIOPSY Right 06/2016   lumpectomy with rad    BREAST EXCISIONAL BIOPSY Right 2009   breast ca + mammo site radiation   BREAST LUMPECTOMY Right 2017   invasive mammary NEG margins   BREAST LUMPECTOMY Right 2009   mammosite   BREAST LUMPECTOMY WITH NEEDLE LOCALIZATION Right 07/18/2016   Procedure: BREAST LUMPECTOMY WITH NEEDLE LOCALIZATION AND RIGHT AXILLARY EXPLORATION;  Surgeon: Robert Bellow, MD;   Location: [facility] ORS;  Service: General;  Laterality: Right;   BREAST SURGERY Right July 2009   Wide excision, sentinel left node biopsy MammoSite. ( 06/21/2008   CHOLECYSTECTOMY  1984   COLONOSCOPY WITH PROPOFOL N/A 10/13/2018   Procedure: COLONOSCOPY WITH PROPOFOL;  Surgeon: Lucilla Lame, MD;  Location: [facility] ENDOSCOPY;  Service: Endoscopy;  Laterality: N/A;   DILATION AND CURETTAGE OF UTERUS  2004   ENDOMETRIAL ABLATION  2005   IR FLUORO GUIDE PORT INSERTION RIGHT  01/15/2018   TUMOR REMOVAL  12/2013   from uterus done by [facility]   Social History:  reports that she has never smoked. She has never used smokeless tobacco. She reports that she does not drink alcohol and does not use drugs.  No Known Allergies Family History  Problem Relation Age of Onset   CAD Mother    Osteoporosis Mother    Lung cancer Father    Diabetes Father    Hypertension Brother    Ovarian cancer Maternal Grandmother    Heart attack Paternal Grandmother    Diabetes Paternal Grandfather    Fibromyalgia Brother    Hypertension Brother    Hypertension Brother    Sleep apnea Brother    Healthy Brother    Sleep apnea Brother    Ovarian cancer Other    Breast cancer Neg Hx     Prior to Admission medications   Medication Sig Start Date End Date Taking? Authorizing Provider  Calcium Carb-Cholecalciferol (CALCIUM 600/VITAMIN D PO) Take 1 tablet by mouth daily.   Yes [provider]  cetirizine (ZYRTEC) 10 MG tablet Take 10 mg daily by mouth.   Yes [provider]  fluticasone (FLONASE) 50 MCG/ACT nasal spray USE 2 SPRAYS INTO BOTH NOSTRILS DAILY Patient taking differently: Place 1 spray into both nostrils daily. 03/28/21  Yes Bacigalupo, Dionne Bucy, MD  meloxicam (MOBIC) 7.5 MG tablet Take 7.5 mg by mouth in the morning and at bedtime.   Yes [provider]  Multiple Vitamin (MULTIVITAMIN) capsule Take 1 capsule by mouth daily.   Yes [provider]  Omega-3 Krill Oil 500 MG  CAPS Take 500 mg by mouth daily.   Yes [provider]   Physical Exam: Blood pressure (!) 84/57, pulse (!) 58, temperature (!) 97.4 F (36.3 C), temperature source Temporal, resp. rate 17, height _0  (1.575 m), weight 80.3 kg, SpO2 95 %. Vitals:   11/13/21 2223 11/13/21 2240  BP: (!) 89/48 (!) 84/57  Pulse: (!) 58 (!) 58  Resp: 18 17  Temp:    SpO2: 96% 95%    General: Alert awake and oriented Eyes: Pupils equally round reactive to light extraocular movements intact ENT: Ear nose and throat normal inspection tongue midline. Neck: No carotid bruits no lymphadenopathy. Cardiovascular: Regular rate and rhythm no murmurs. Respiratory: Clear to auscultation bilaterally. Abdomen: Soft nontender nondistended. Skin: Warm no rashes Psychiatric: Normal affect pleasant and oriented. Neurologic: No focal deficits and cranial nerves grossly intact.  Labs on Admission:  Basic Metabolic Panel: No results  for input(s): NA, K, CL, CO2, GLUCOSE, BUN, CREATININE, CALCIUM, MG, PHOS in the last 168 hours. Liver Function Tests: No results for input(s): AST, ALT, ALKPHOS, BILITOT, PROT, ALBUMIN in the last 168 hours. No results for input(s): LIPASE, AMYLASE in the last 168 hours. No results for input(s): AMMONIA in the last 168 hours. CBC: Recent Labs  Lab 11/13/21 2308  WBC 9.1  HGB 11.5*  11.4*  HCT 35.5*  35.0*  MCV 93.2  PLT 126*   Cardiac Enzymes: No results for input(s): CKTOTAL, CKMB, CKMBINDEX, TROPONINI in the last 168 hours. BNP: Invalid input(s): POCBNP CBG: Recent Labs  Lab 11/13/21 1520  GLUCAP 116*    Radiological Exams on Admission: DG Knee Left Port  Result Date: 11/13/2021 CLINICAL DATA:  Status post left knee replacement EXAM: PORTABLE LEFT KNEE - 1-2 VIEW COMPARISON:  None. FINDINGS: The patient is status post left total knee arthroplasty. Hardware alignment is within expected limits, without evidence of complication. There is expected surrounding  soft tissue swelling and soft tissue gas. IMPRESSION: Status post left knee arthroplasty without evidence of complication. Electronically Signed   By: Valetta Mole M.D.   On: 11/13/2021 11:29    EKG: Independently reviewed.  Sinus rhythm 64  Time spent: 45 minutes  Lazlo Tunney V Gildo Crisco 6 PM- 2AM. [facility] If 7PM-7AM, please contact night-coverage [email or web address] Password [facility] 11/13/2021, 11:54 PM

## 2021-11-13 NOTE — Progress Notes (Signed)
Chaplain Maggie responded to code blue to rm 23. Offered support to pt's husband in consult room. Social support and prayer were central to this visit.

## 2021-11-13 NOTE — Evaluation (Signed)
Physical Therapy Evaluation Patient Details Name: Victoria Benson MRN: [id number] DOB: 1956/07/28 Today's Date: 11/13/2021  History of Present Illness  Pt is a 65 y.o. female who presents with history of breast cancer, lymphoma, and skin cancer with a diagnosis of left knee osteoarthritis s/p L TKA.   Clinical Impression  Pt was pleasant and motivated to participate during the session and put forth good effort throughout. Pt was able to complete all ther ex in bed. Pt reported feeling nauseous and felt like she was going to "pass out" but did not report any chest pain. PT and nursing suspected BP was the issue therefore were about to take BP, but at that moment pt's eyes rolled to the back of her head and she began breathing very heavily. Pt then became unresponsive with significant reduction in RR and a code blue was called. Pt came-to shortly after being unresponsive. PT to reassess pt in the morning. Expect that pt will make good progress while in acute care based of limited assessment this date and at this time will recommend HHPT at discharge from acute care.    Recommendations for follow up therapy are one component of a multi-disciplinary discharge planning process, led by the attending physician.  Recommendations may be updated based on patient status, additional functional criteria and insurance authorization.  Follow Up Recommendations Home health PT    Assistance Recommended at Discharge Intermittent Supervision/Assistance  Functional Status Assessment    Equipment Recommendations  None recommended by PT    Recommendations for Other Services       Precautions / Restrictions Precautions Precautions: Knee Precaution Booklet Issued: No Required Braces or Orthoses: Knee Immobilizer - Left Knee Immobilizer - Left: Other (comment) (Per conversation with surgeon, knee immobilizer to be left on when in bed only to encourgae left knee extension; may be off with  ambulation/therapy) Restrictions Weight Bearing Restrictions: Yes LLE Weight Bearing: Weight bearing as tolerated      Mobility  Bed Mobility               General bed mobility comments: NT    Transfers                        Ambulation/Gait                  Stairs            Wheelchair Mobility    Modified Rankin (Stroke Patients Only)       Balance                                             Pertinent Vitals/Pain Pain Assessment: 0-10 Pain Score: 3  Pain Location: L knee Pain Descriptors / Indicators: Aching;Sore Pain Intervention(s): Repositioned    Home Living Family/patient expects to be discharged to:: Private residence Living Arrangements: Spouse/significant other (Lives with husband) Available Help at Discharge: Family;Available 24 hours/day (husband) Type of Home: House Home Access: Level entry ([place] garage)     Alternate Level Stairs-Number of Steps: Flight; ~15 steps Home Layout: Two level Home Equipment: Conservation officer, nature (2 wheels);BSC/3in1;Grab bars - toilet Additional Comments: Michela Pitcher they have access to additional equipment through their church    Prior Function Prior Level of Function : Independent/Modified Independent             Mobility Comments: Did  not require AD, no history of falls ADLs Comments: Did not require assistance     Hand Dominance        Extremity/Trunk Assessment   Upper Extremity Assessment Upper Extremity Assessment: Overall WFL for tasks assessed    Lower Extremity Assessment Lower Extremity Assessment: LLE deficits/detail LLE Deficits / Details: BLE sensation to light touch and ankle strength and AROM all grossly WNL LLE: Unable to fully assess due to pain LLE Sensation: WNL LLE Coordination: WNL       Communication   Communication: No difficulties  Cognition Arousal/Alertness: Awake/alert Behavior During Therapy: WFL for tasks  assessed/performed Overall Cognitive Status: Within Functional Limits for tasks assessed                                          General Comments      Exercises Total Joint Exercises Ankle Circles/Pumps: Strengthening;10 reps;Supine;Both;AROM Quad Sets: Strengthening;10 reps;Both;Supine;AROM Gluteal Sets: AROM;Strengthening;Both;10 reps;Supine Other Exercises Other Exercises: Pt education on knee immobilizer   Assessment/Plan    PT Assessment Patient needs continued PT services  PT Problem List Decreased strength;Decreased activity tolerance;Decreased balance;Decreased mobility;Decreased knowledge of precautions;Decreased knowledge of use of DME;Pain;Decreased range of motion       PT Treatment Interventions DME instruction;Gait training;Stair training;Functional mobility training;Therapeutic activities;Therapeutic exercise;Balance training;Patient/family education    PT Goals (Current goals can be found in the Care Plan section)  Acute Rehab PT Goals Patient Stated Goal: Get upstairs to bed PT Goal Formulation: With patient Time For Goal Achievement: 11/26/21 Potential to Achieve Goals: Good    Frequency BID   Barriers to discharge        Co-evaluation               AM-PAC PT "6 Clicks" Mobility  Outcome Measure Help needed turning from your back to your side while in a flat bed without using bedrails?: A Little Help needed moving from lying on your back to sitting on the side of a flat bed without using bedrails?: A Little Help needed moving to and from a bed to a chair (including a wheelchair)?: A Little Help needed standing up from a chair using your arms (e.g., wheelchair or bedside chair)?: A Little Help needed to walk in hospital room?: A Little Help needed climbing 3-5 steps with a railing? : A Little 6 Click Score: 18    End of Session Equipment Utilized During Treatment: Left knee immobilizer Activity Tolerance: Other (comment)  (unable to assess) Patient left: in bed;with nursing/sitter in room Nurse Communication: Other (comment) (will dicuss at next session) PT Visit Diagnosis: Other abnormalities of gait and mobility (R26.89);Pain;Muscle weakness (generalized) (M62.81) Pain - Right/Left: Left Pain - part of body: Knee    Time: 8032-1224 PT Time Calculation (min) (ACUTE ONLY): 31 min   Charges:              Sheldon Silvan SPT 11/13/21, 5:12 PM

## 2021-11-13 NOTE — Progress Notes (Signed)
PT working with patient lifting surgical leg, patient complains of nausea and that she is going to pass out. Patient's eyes roll back and she passes out. Patient taking shallow breaths, Code Blue called over head in hospital. HOB inverted, Patient's blood pressure, oxygen level, heart rate obtained. Pt responding, able to move extremities, follow commands, Alert and oriented x4 . EKG ordered by Dr. Wynetta Emery, EKG Normal,  Patient continues to complain of nausea, medication given Spouse updated and present at bedside.

## 2021-11-14 ENCOUNTER — Inpatient Hospital Stay (HOSPITAL_COMMUNITY)
Admission: RE | Admit: 2021-11-14 | Discharge: 2021-11-14 | Disposition: A | Discharge status: Home / Self Care | Payer: Managed Care, Other (non HMO) | Source: Home / Self Care | Attending: Internal Medicine | Admitting: Internal Medicine

## 2021-11-14 ENCOUNTER — Encounter: Payer: Self-pay | Admitting: Orthopedic Surgery

## 2021-11-14 ENCOUNTER — Observation Stay
Admission: RE | Admit: 2021-11-14 | Discharge: 2021-11-14 | Disposition: A | Discharge status: Home / Self Care | Payer: Managed Care, Other (non HMO) | Source: Home / Self Care | Attending: Internal Medicine | Admitting: Internal Medicine

## 2021-11-14 ENCOUNTER — Observation Stay: Payer: Managed Care, Other (non HMO)

## 2021-11-14 DIAGNOSIS — I9581 Postprocedural hypotension: Secondary | ICD-10-CM | POA: Diagnosis not present

## 2021-11-14 DIAGNOSIS — Z96652 Presence of left artificial knee joint: Secondary | ICD-10-CM | POA: Diagnosis not present

## 2021-11-14 DIAGNOSIS — R079 Chest pain, unspecified: Secondary | ICD-10-CM | POA: Diagnosis not present

## 2021-11-14 DIAGNOSIS — Z85828 Personal history of other malignant neoplasm of skin: Secondary | ICD-10-CM | POA: Diagnosis not present

## 2021-11-14 DIAGNOSIS — Z923 Personal history of irradiation: Secondary | ICD-10-CM | POA: Diagnosis not present

## 2021-11-14 DIAGNOSIS — I4729 Other ventricular tachycardia: Secondary | ICD-10-CM | POA: Diagnosis not present

## 2021-11-14 DIAGNOSIS — E669 Obesity, unspecified: Secondary | ICD-10-CM | POA: Diagnosis present

## 2021-11-14 DIAGNOSIS — Z791 Long term (current) use of non-steroidal anti-inflammatories (NSAID): Secondary | ICD-10-CM | POA: Diagnosis not present

## 2021-11-14 DIAGNOSIS — I959 Hypotension, unspecified: Secondary | ICD-10-CM | POA: Diagnosis not present

## 2021-11-14 DIAGNOSIS — I472 Ventricular tachycardia, unspecified: Secondary | ICD-10-CM | POA: Diagnosis present

## 2021-11-14 DIAGNOSIS — Z853 Personal history of malignant neoplasm of breast: Secondary | ICD-10-CM | POA: Diagnosis not present

## 2021-11-14 DIAGNOSIS — Z79899 Other long term (current) drug therapy: Secondary | ICD-10-CM | POA: Diagnosis not present

## 2021-11-14 DIAGNOSIS — M1712 Unilateral primary osteoarthritis, left knee: Secondary | ICD-10-CM | POA: Diagnosis present

## 2021-11-14 DIAGNOSIS — Z8572 Personal history of non-Hodgkin lymphomas: Secondary | ICD-10-CM | POA: Diagnosis not present

## 2021-11-14 DIAGNOSIS — E038 Other specified hypothyroidism: Secondary | ICD-10-CM | POA: Diagnosis present

## 2021-11-14 DIAGNOSIS — Z6832 Body mass index (BMI) 32.0-32.9, adult: Secondary | ICD-10-CM | POA: Diagnosis not present

## 2021-11-14 DIAGNOSIS — Z9221 Personal history of antineoplastic chemotherapy: Secondary | ICD-10-CM | POA: Diagnosis not present

## 2021-11-14 DIAGNOSIS — I951 Orthostatic hypotension: Secondary | ICD-10-CM | POA: Diagnosis present

## 2021-11-14 DIAGNOSIS — M25562 Pain in left knee: Secondary | ICD-10-CM | POA: Diagnosis present

## 2021-11-14 DIAGNOSIS — Z9049 Acquired absence of other specified parts of digestive tract: Secondary | ICD-10-CM | POA: Diagnosis not present

## 2021-11-14 LAB — CBC
HCT: 36.4 % (ref 36.0–46.0)
Hemoglobin: 12 g/dL (ref 12.0–15.0)
MCH: 30.7 pg (ref 26.0–34.0)
MCHC: 33 g/dL (ref 30.0–36.0)
MCV: 93.1 fL (ref 80.0–100.0)
Platelets: 129 10*3/uL — ABNORMAL LOW (ref 150–400)
RBC: 3.91 MIL/uL (ref 3.87–5.11)
RDW: 13 % (ref 11.5–15.5)
WBC: 8.8 10*3/uL (ref 4.0–10.5)
nRBC: 0 % (ref 0.0–0.2)

## 2021-11-14 LAB — BASIC METABOLIC PANEL
Anion gap: 5 (ref 5–15)
BUN: 7 mg/dL — ABNORMAL LOW (ref 8–23)
CO2: 25 mmol/L (ref 22–32)
Calcium: 8.4 mg/dL — ABNORMAL LOW (ref 8.9–10.3)
Chloride: 107 mmol/L (ref 98–111)
Creatinine, Ser: 0.55 mg/dL (ref 0.44–1.00)
GFR, Estimated: >60 mL/min (ref >60)
Glucose, Bld: 120 mg/dL — ABNORMAL HIGH (ref 70–99)
Potassium: 3.7 mmol/L (ref 3.5–5.1)
Sodium: 137 mmol/L (ref 135–145)

## 2021-11-14 LAB — GLUCOSE, CAPILLARY
Glucose-Capillary: 117 mg/dL — ABNORMAL HIGH (ref 70–99)
Glucose-Capillary: 106 mg/dL — ABNORMAL HIGH (ref 70–99)

## 2021-11-14 LAB — TROPONIN I (HIGH SENSITIVITY)
Troponin I (High Sensitivity): 3 ng/L (ref <18)
Troponin I (High Sensitivity): <2 ng/L (ref <18)

## 2021-11-14 LAB — VITAMIN B12: Vitamin B-12: 253 pg/mL (ref 180–914)

## 2021-11-14 MED ORDER — PANTOPRAZOLE SODIUM 40 MG PO TBEC
40.0000 mg | DELAYED_RELEASE_TABLET | Freq: Two times a day (BID) | ORAL | Status: DC
  Administered 2021-11-14 – 2021-11-15 (×2): 40 mg via ORAL
  Filled 2021-11-14 (×2): qty 1

## 2021-11-14 MED ORDER — OXYCODONE HCL 5 MG PO TABS
5.0000 mg | ORAL_TABLET | Freq: Four times a day (QID) | ORAL | Status: DC | PRN
Start: 2021-11-14 — End: 2021-11-16
  Administered 2021-11-15: 01:00:00 10 mg via ORAL
  Administered 2021-11-15 (×2): 5 mg via ORAL
  Filled 2021-11-14: qty 2
  Filled 2021-11-14 (×2): qty 1

## 2021-11-14 MED ORDER — ENOXAPARIN SODIUM 40 MG/0.4ML IJ SOSY
40.0000 mg | PREFILLED_SYRINGE | INTRAMUSCULAR | Status: DC
  Administered 2021-11-14 – 2021-11-15 (×2): 40 mg via SUBCUTANEOUS
  Filled 2021-11-14 (×2): qty 0.4

## 2021-11-14 MED ORDER — CHLORHEXIDINE GLUCONATE CLOTH 2 % EX PADS
6.0000 | MEDICATED_PAD | Freq: Every day | CUTANEOUS | Status: DC
  Administered 2021-11-14 – 2021-11-15 (×2): 6 via TOPICAL

## 2021-11-14 MED ORDER — IOHEXOL 350 MG/ML SOLN
75.0000 mL | Freq: Once | INTRAVENOUS | Status: AC | PRN
  Administered 2021-11-14: 75 mL via INTRAVENOUS

## 2021-11-14 MED ORDER — HYDROMORPHONE HCL 1 MG/ML IJ SOLN
0.5000 mg | Freq: Four times a day (QID) | INTRAMUSCULAR | Status: DC | PRN
Start: 2021-11-14 — End: 2021-11-16

## 2021-11-14 NOTE — Progress Notes (Signed)
PT Cancellation Note  Patient Details Name: Victoria Benson MRN: [id number] DOB: 03-18-1956   Cancelled Treatment:    Reason Eval/Treat Not Completed: Medical issues which prohibited therapy: Pt with active bedrest order. Per secure chat from Dr. Mack Guise, awaiting input from care team in CCU as to whether or not pt stable/appropriate for OOB/functional mobility. PT services to be held until a plan is established per MD. Of note, pt transferred to higher level of care since initial PT order was placed. Will require continue at transfer order or new PT order to resume PT services.   Linus Salmons PT, DPT 11/14/21, 10:26 AM

## 2021-11-14 NOTE — Progress Notes (Signed)
Labs results are back, Patient being transported to CT with RN and NT at bedside then to take patient to [facility]. ICU aware of incoming patient.

## 2021-11-14 NOTE — Progress Notes (Signed)
PHARMACIST - PHYSICIAN COMMUNICATION  CONCERNING: IV to Oral Route Change Policy  RECOMMENDATION: This patient is receiving pantoprazole by the intravenous route.  Based on criteria approved by the Pharmacy and Therapeutics Committee, the intravenous medication(s) is/are being converted to the equivalent oral dose form(s).   DESCRIPTION: These criteria include: The patient is eating (either orally or via tube) and/or has been taking other orally administered medications for a least 24 hours The patient has no evidence of active gastrointestinal bleeding or impaired GI absorption (gastrectomy, short bowel, patient on TNA or NPO).  If you have questions about this conversion, please contact the [facility] 11/14/2021 10:51 AM

## 2021-11-14 NOTE — Progress Notes (Signed)
Physical Therapy Treatment Patient Details Name: Victoria Benson MRN: [id number] DOB: 1956-04-25 Today's Date: 11/14/2021   History of Present Illness Pt is a 65 y.o. female who presents with history of breast cancer, lymphoma, and skin cancer with a diagnosis of left knee osteoarthritis and is now s/p L TKA.   PT Comments    Pt was pleasant and motivated to participate during the session and put forth good effort throughout. Supine BP was 106/58 and HR was 73. Pt was able to complete all ther ex in bed. Pt required supervision with bed mobility for safety and cuing to continue breathing. Seated BP was 104/57 and HR was 75. Pt required min guard for safety when performing STS with min cuing for proper sequencing. Standing BP was 74/48 and HR 79 but pt was asymptomatic. Due to orthostatic BP, PT decided to end the session. Final BP in supine was 92/56 and HR was 63. Anticipate pt will make good progress toward goals once BP issues are resolved. Pt will benefit from HHPT upon discharge to safely address deficits listed in patient problem list for decreased caregiver assistance and eventual return to PLOF.   Recommendations for follow up therapy are one component of a multi-disciplinary discharge planning process, led by the attending physician.  Recommendations may be updated based on patient status, additional functional criteria and insurance authorization.  Follow Up Recommendations  Home health PT     Assistance Recommended at Discharge Frequent or constant Supervision/Assistance  Equipment Recommendations  None recommended by PT    Recommendations for Other Services       Precautions / Restrictions Precautions Precautions: Knee Required Braces or Orthoses: Knee Immobilizer - Left Knee Immobilizer - Left: Other (comment) Restrictions Weight Bearing Restrictions: Yes LLE Weight Bearing: Weight bearing as tolerated Other Position/Activity Restrictions: KI on in bed only to promote knee  ext per Dr. Mack Guise     Mobility  Bed Mobility Overal bed mobility: Needs Assistance Bed Mobility: Supine to Sit;Sit to Supine     Supine to sit: Supervision Sit to supine: Supervision   General bed mobility comments: Extra time and effort as well as use of bedrail    Transfers Overall transfer level: Needs assistance Equipment used: Rolling walker (2 wheels) Transfers: Sit to/from Stand Sit to Stand: Min guard           General transfer comment: Min verbal cues for sequencing    Ambulation/Gait         Gait velocity: decreased     General Gait Details: deferred secondary to drop in BP per above   Stairs             Wheelchair Mobility    Modified Rankin (Stroke Patients Only)       Balance Overall balance assessment: Needs assistance   Sitting balance-Leahy Scale: Good     Standing balance support: Bilateral upper extremity supported;During functional activity Standing balance-Leahy Scale: Fair Standing balance comment: Min to mod lean on the RW for support                            Cognition Arousal/Alertness: Awake/alert Behavior During Therapy: WFL for tasks assessed/performed Overall Cognitive Status: Within Functional Limits for tasks assessed  Exercises Total Joint Exercises Ankle Circles/Pumps: Strengthening;10 reps;Both;AROM Quad Sets: Strengthening;Left;5 reps;10 reps Gluteal Sets: Strengthening;Both;10 reps Straight Leg Raises: AAROM;Strengthening;Left;5 reps Long Arc Quad: AROM;Strengthening;Left;10 reps;15 reps Knee Flexion: AROM;Strengthening;Left;10 reps;15 reps Goniometric ROM: L knee AROM: 8-74 deg Other Exercises Other Exercises: LLE positioning education to prevent heel pressure and promote L knee ext PROM    General Comments        Pertinent Vitals/Pain Pain Assessment: 0-10 Pain Score: 3  Pain Location: L knee Pain Descriptors /  Indicators: Sore Pain Intervention(s): Premedicated before session;Monitored during session;Ice applied    Home Living                          Prior Function            PT Goals (current goals can now be found in the care plan section) Progress towards PT goals: Progressing toward goals    Frequency    BID      PT Plan Current plan remains appropriate    Co-evaluation              AM-PAC PT "6 Clicks" Mobility   Outcome Measure  Help needed turning from your back to your side while in a flat bed without using bedrails?: A Little Help needed moving from lying on your back to sitting on the side of a flat bed without using bedrails?: A Little Help needed moving to and from a bed to a chair (including a wheelchair)?: A Little Help needed standing up from a chair using your arms (e.g., wheelchair or bedside chair)?: A Little Help needed to walk in hospital room?: A Little Help needed climbing 3-5 steps with a railing? : A Lot 6 Click Score: 17    End of Session Equipment Utilized During Treatment: Gait belt Activity Tolerance: Other (comment) (limited by drop in BP in standing) Patient left: in bed;with call bell/phone within reach;with SCD's reapplied;Other (comment);with family/visitor present (KI donned to L knee) Nurse Communication: Mobility status;Other (comment) (BP in standing per above) PT Visit Diagnosis: Other abnormalities of gait and mobility (R26.89);Pain;Muscle weakness (generalized) (M62.81) Pain - Right/Left: Left Pain - part of body: Knee     Time: 0454-0981 PT Time Calculation (min) (ACUTE ONLY): 39 min  Charges:                        Sheldon Silvan SPT 11/14/21, 5:34 PM

## 2021-11-14 NOTE — Progress Notes (Signed)
OT Cancellation Note  Patient Details Name: Victoria Benson MRN: [id number] DOB: May 24, 1956   Cancelled Treatment:    Reason Eval/Treat Not Completed: Active bedrest order;Other (comment). OT order received, chart reviewed. Pt noted with active bedrest order. Per secure chat from MD, awaiting input from care team in CCU as to whether or not pt stable/appropriate for OOB/functional mobility. Will hold at this time. Of note, pt transferred to higher level of care since initial OT order was placed. Will require continue upon t/f or new OT order to evaluate in CCU. Thank you.   Shara Blazing, M.S., OTR/L [facility] - [facility] Ascom: [phone] 11/14/21, 9:40 AM

## 2021-11-14 NOTE — TOC Initial Note (Signed)
Transition of Care [facility]) - Initial/Assessment Note    Patient Details  Name: Victoria Benson MRN: [id number] Date of Birth: Jun 12, 1956  Transition of Care [facility]) CM/SW Contact:    Kerin Salen, RN Phone Number: 11/14/2021, 2:37 PM  Clinical Narrative: TOCRN spoke with Husband Mark patient asleep, NMS. Husband states patient was independent prior to hospital visit able to drive, do cooking, shopping and take care of 64yo  mother who lives in the home. [facility], do not use assisted devices. Discussed discharge recommendation for HHPT, husband was receptive voices never using [facility] services before did not have a preference and if Insurance will cover they can come to the home or he can do outpatient PT. TOC to continue to track.               Expected Discharge Plan: [place] Barriers to Discharge: Continued Medical Work up   Patient Goals and CMS Choice Patient states their goals for this hospitalization and ongoing recovery are:: To return home per husband.   Choice offered to / list presented to : Spouse  Expected Discharge Plan and Services Expected Discharge Plan: [place] Choice: [place] arrangements for the past 2 months: Single Family Home                                      Prior Living Arrangements/Services Living arrangements for the past 2 months: Single Family Home Lives with:: Spouse, Parents (Patients mother lives in the home.)          Need for Family Participation in Patient Care: Yes (Comment) Care giver support system in place?: Yes (comment)   Criminal Activity/Legal Involvement Pertinent to Current Situation/Hospitalization: No - Comment as needed  Activities of Daily Living Home Assistive Devices/Equipment: Eyeglasses, Environmental consultant (specify type), Cane (specify quad or straight), Bedside commode/3-in-1, Shower chair with back, Scales, Hearing aid ADL Screening (condition  at time of admission) Patient's cognitive ability adequate to safely complete daily activities?: Yes Is the patient deaf or have difficulty hearing?: No Does the patient have difficulty seeing, even when wearing glasses/contacts?: No Does the patient have difficulty concentrating, remembering, or making decisions?: No Patient able to express need for assistance with ADLs?: Yes Does the patient have difficulty dressing or bathing?: No Independently performs ADLs?: Yes (appropriate for developmental age) Does the patient have difficulty walking or climbing stairs?: Yes Weakness of Legs: Left Weakness of Arms/Hands: None  Permission Sought/Granted                  Emotional Assessment   Attitude/Demeanor/Rapport: Unable to Assess Affect (typically observed): Unable to Assess   Alcohol / Substance Use: Not Applicable Psych Involvement: No (comment)  Admission diagnosis:  S/P total knee arthroplasty, left [Z96.652] Patient Active Problem List   Diagnosis Date Noted   S/P total knee arthroplasty, left 11/13/2021   Hypotension 11/13/2021   Other chest pain 05/15/2021   Edema of left lower extremity 09/29/2020   Varicose veins of left lower extremity with pain 09/29/2020   Malodorous urine 09/29/2020   Class 1 obesity without serious comorbidity with body mass index (BMI) of 33.0 to 33.9 in adult 09/28/2019   Joint stiffness 09/28/2019   Polyarthralgia 09/28/2019   DLBCL (diffuse large B cell lymphoma) ([place]) 01/14/2018   Chronic venous insufficiency 04/21/2017  Leg pain 04/21/2017   Malignant neoplasm of upper-inner quadrant of right breast in female, estrogen receptor positive ([place]) 07/11/2016   Subclinical hypothyroidism 03/20/2016   Allergic rhinitis 01/04/2016   Varicose veins of both lower extremities with pain 01/04/2016   Avitaminosis D 05/18/2009   Fam hx-ischem heart disease 04/05/2009   HLD (hyperlipidemia) 04/04/2009   Adaptive colitis 04/04/2009   PCP:   Virginia Crews, MD Pharmacy:   [place], [place] [address] [place] [address] Phone: [phone] Fax: [phone]     Social Determinants of Health (SDOH) Interventions    Readmission Risk Interventions No flowsheet data found.

## 2021-11-14 NOTE — Progress Notes (Addendum)
Subjective:  POD #1 s/p left total knee arthroplasty.   Patient reports left knee pain as moderate.  Patient transferred to the ICU overnight for hypotension.  Patient is not on pressors.  Her blood pressure is improving.  Patient's husband is at the bedside.  Objective:   VITALS:   Vitals:   11/14/21 0400 11/14/21 0600 11/14/21 0800 11/14/21 1000  BP: (!) 81/49 (!) 85/48 (!) 96/54 (!) 96/56  Pulse: (!) 59 (!) 57 61 61  Resp: 15 16 14 14   Temp: 99 F (37.2 C)  97.6 F (36.4 C)   TempSrc: Oral  Oral   SpO2: 95% 93% 93% 96%  Weight:      Height:        PHYSICAL EXAM: General: Patient alert oriented and in no acute distress.  Left lower extremity: Neurovascular intact Sensation intact distally Intact pulses distally Dorsiflexion/Plantar flexion intact Incision: dressing C/D/I No cellulitis present Compartment soft  LABS  Results for orders placed or performed during the hospital encounter of 11/13/21 (from the past 24 hour(s))  Glucose, capillary     Status: Abnormal   Collection Time: 11/13/21  3:20 PM  Result Value Ref Range   Glucose-Capillary 116 (H) 70 - 99 mg/dL  Brain natriuretic peptide     Status: Abnormal   Collection Time: 11/13/21 11:08 PM  Result Value Ref Range   B Natriuretic Peptide 109.0 (H) 0.0 - 100.0 pg/mL  T4, free     Status: None   Collection Time: 11/13/21 11:08 PM  Result Value Ref Range   Free T4 0.76 0.61 - 1.12 ng/dL  TSH     Status: None   Collection Time: 11/13/21 11:08 PM  Result Value Ref Range   TSH 2.184 0.350 - 4.500 uIU/mL  Troponin I (High Sensitivity)     Status: None   Collection Time: 11/13/21 11:08 PM  Result Value Ref Range   Troponin I (High Sensitivity) <2 <18 ng/L  Vitamin B12     Status: None   Collection Time: 11/13/21 11:08 PM  Result Value Ref Range   Vitamin B-12 253 180 - 914 pg/mL  Hemoglobin and hematocrit, blood     Status: Abnormal   Collection Time: 11/13/21 11:08 PM  Result Value Ref Range    Hemoglobin 11.4 (L) 12.0 - 15.0 g/dL   HCT 35.0 (L) 36.0 - 46.0 %  Comprehensive metabolic panel     Status: Abnormal   Collection Time: 11/13/21 11:08 PM  Result Value Ref Range   Sodium 138 135 - 145 mmol/L   Potassium 3.9 3.5 - 5.1 mmol/L   Chloride 108 98 - 111 mmol/L   CO2 23 22 - 32 mmol/L   Glucose, Bld 136 (H) 70 - 99 mg/dL   BUN 7 (L) 8 - 23 mg/dL   Creatinine, Ser 0.47 0.44 - 1.00 mg/dL   Calcium 8.4 (L) 8.9 - 10.3 mg/dL   Total Protein 5.2 (L) 6.5 - 8.1 g/dL   Albumin 3.0 (L) 3.5 - 5.0 g/dL   AST 20 15 - 41 U/L   ALT 19 0 - 44 U/L   Alkaline Phosphatase 59 38 - 126 U/L   Total Bilirubin 0.8 0.3 - 1.2 mg/dL   GFR, Estimated >60 >60 mL/min   Anion gap 7 5 - 15  CBC     Status: Abnormal   Collection Time: 11/13/21 11:08 PM  Result Value Ref Range   WBC 9.1 4.0 - 10.5 K/uL   RBC 3.81 (L) 3.87 -  5.11 MIL/uL   Hemoglobin 11.5 (L) 12.0 - 15.0 g/dL   HCT 35.5 (L) 36.0 - 46.0 %   MCV 93.2 80.0 - 100.0 fL   MCH 30.2 26.0 - 34.0 pg   MCHC 32.4 30.0 - 36.0 g/dL   RDW 13.0 11.5 - 15.5 %   Platelets 126 (L) 150 - 400 K/uL   nRBC 0.0 0.0 - 0.2 %  Glucose, capillary     Status: Abnormal   Collection Time: 11/14/21 12:27 AM  Result Value Ref Range   Glucose-Capillary 117 (H) 70 - 99 mg/dL  CBC     Status: Abnormal   Collection Time: 11/14/21 12:39 AM  Result Value Ref Range   WBC 8.8 4.0 - 10.5 K/uL   RBC 3.91 3.87 - 5.11 MIL/uL   Hemoglobin 12.0 12.0 - 15.0 g/dL   HCT 36.4 36.0 - 46.0 %   MCV 93.1 80.0 - 100.0 fL   MCH 30.7 26.0 - 34.0 pg   MCHC 33.0 30.0 - 36.0 g/dL   RDW 13.0 11.5 - 15.5 %   Platelets 129 (L) 150 - 400 K/uL   nRBC 0.0 0.0 - 0.2 %  Basic metabolic panel     Status: Abnormal   Collection Time: 11/14/21 12:39 AM  Result Value Ref Range   Sodium 137 135 - 145 mmol/L   Potassium 3.7 3.5 - 5.1 mmol/L   Chloride 107 98 - 111 mmol/L   CO2 25 22 - 32 mmol/L   Glucose, Bld 120 (H) 70 - 99 mg/dL   BUN 7 (L) 8 - 23 mg/dL   Creatinine, Ser 0.55 0.44 - 1.00  mg/dL   Calcium 8.4 (L) 8.9 - 10.3 mg/dL   GFR, Estimated >60 >60 mL/min   Anion gap 5 5 - 15  Troponin I (High Sensitivity)     Status: None   Collection Time: 11/14/21 12:39 AM  Result Value Ref Range   Troponin I (High Sensitivity) 3 <18 ng/L  Troponin I (High Sensitivity)     Status: None   Collection Time: 11/14/21  2:45 AM  Result Value Ref Range   Troponin I (High Sensitivity) <2 <18 ng/L    CT Angio Chest Pulmonary Embolism (PE) W or WO Contrast  Result Date: 11/14/2021 CLINICAL DATA:  Right-sided chest pain and shortness of breath, initial encounter EXAM: CT ANGIOGRAPHY CHEST WITH CONTRAST TECHNIQUE: Multidetector CT imaging of the chest was performed using the standard protocol during bolus administration of intravenous contrast. Multiplanar CT image reconstructions and MIPs were obtained to evaluate the vascular anatomy. CONTRAST:  15mL OMNIPAQUE IOHEXOL 350 MG/ML SOLN COMPARISON:  04/03/2021 chest x-ray FINDINGS: Cardiovascular: Atherosclerotic calcifications of the thoracic aorta are noted. No aneurysmal dilatation or dissection is noted. No cardiac enlargement is seen. Pulmonary artery demonstrates a normal branching pattern bilaterally. No intraluminal filling defect to suggest pulmonary embolism is seen. No coronary calcifications are noted. Mediastinum/Nodes: Thoracic inlet is within normal limits. No sizable hilar or mediastinal adenopathy is noted. The esophagus as visualized is within normal limits. Lungs/Pleura: The lungs are well aerated bilaterally with mild dependent atelectatic changes. No focal confluent infiltrate is seen. No sizable effusion or pneumothorax is noted. No parenchymal nodules are seen. Upper Abdomen: Visualized upper abdomen appears within normal limits. Musculoskeletal: Degenerative changes of the thoracic spine are noted. No definitive rib abnormality is seen. Review of the MIP images confirms the above findings. IMPRESSION: No evidence of pulmonary  emboli. Mild dependent atelectatic changes. Electronically Signed   By: [name]  Lukens M.D.   On: 11/14/2021 00:26   DG Knee Left Port  Result Date: 11/13/2021 CLINICAL DATA:  Status post left knee replacement EXAM: PORTABLE LEFT KNEE - 1-2 VIEW COMPARISON:  None. FINDINGS: The patient is status post left total knee arthroplasty. Hardware alignment is within expected limits, without evidence of complication. There is expected surrounding soft tissue swelling and soft tissue gas. IMPRESSION: Status post left knee arthroplasty without evidence of complication. Electronically Signed   By: Valetta Mole M.D.   On: 11/13/2021 11:29    Assessment/Plan: 1 Day Post-Op   Principal Problem:   Hypotension Active Problems:   Subclinical hypothyroidism   Other chest pain   S/P total knee arthroplasty, left  Patient is currently stable.  She is not requiring any pressors.  Her blood pressure is slowly improving with fluid support.  Patient will restart physical therapy.  Patient will remain hospitalized to ensure she remains neurovascular intact over the next 24 hours before she can be considered for discharge.  Lovenox for DVT prophylaxis.    Thornton Park , MD 11/14/2021, 2:13 PM

## 2021-11-14 NOTE — Progress Notes (Signed)
PROGRESS NOTE    Victoria Benson   VOZ:[id number]  DOB: 09-30-1956  PCP: Virginia Crews, MD    DOA: 11/13/2021 LOS: 0    Brief Narrative / Hospital Course to Date:   [facility] consulted by Orthopedic surgery for management of hypotension.  65 year old female admitted for an elective left total knee arthroplasty.  In the post-op setting, patient had significant hypotension and was admitted to stepdown for close monitoring and further management.  BP improved, responded well to IV fluids without need for vasopressors.  Assessment & Plan   Principal Problem:   Hypotension Active Problems:   Subclinical hypothyroidism   Other chest pain   S/P total knee arthroplasty, left   Hypotension -patient reports BP on the low side since March of this year, advised by PCP to stay well-hydrated and consume fluid with electrolytes.  BP improved with this.  Suspect postop hypotension secondary to anesthesia and pain medications, possibly hypovolemia while n.p.o. Patient reports baseline systolic BPs since March in the 100-110's. CTA chest negative for PE, showed mild atelectasis. -- Stop IV fluids and monitor BP --Maintain MAP 60-65 or higher --Will resume fluids if needed --Follow-up pending echo --Orthostatics when up with PT   Left knee osteoarthritis - POD #1 from TKA with Dr. Seth Bake.   --PT --Pain control as needed --Mgmt per Ortho   Hx of Atypical chest pain - pt reported noticing onset of right-sided chest pain around March when BP issues started.  CTA chest ruled out PE as above. Suspected MSK etiology. --Monitor   Subclinical hypothyroidism - TSH and free T4 normal. --PCP follow up   Obesity: Body mass index is 32.37 kg/m.  Complicates overall care and prognosis.  Recommend lifestyle modifications including physical activity and diet for weight loss and overall long-term health.   DVT prophylaxis: enoxaparin (LOVENOX) injection 40 mg Start: 11/14/21 0800 SCDs  Start: 11/13/21 1100   Diet:  Diet Orders (From admission, onward)     Start     Ordered   11/13/21 1300  Diet regular Room service appropriate? Yes; Fluid consistency: Thin  Diet effective now       Question Answer Comment  Room service appropriate? Yes   Fluid consistency: Thin      11/13/21 1259              Code Status: Full Code   Subjective 11/14/21    Pt seen in ICU this AM, husband at bedside.  She reports feeling well overall today.  Knee pain controlled.  She became very lightheaded and appeared to pass out, per husband, when attempted to mobilize yesterday after surgery.  Pt denies CP, SOB, or other acute complaints.    Disposition Plan & Communication   Status is: Inpatient  Remains inpatient appropriate because: BP remains soft this AM, monitor off IV fluids, PT evaluation pending.   Family Communication: husband at bedside on rounds today 11/16.   Consults, Procedures, Significant Events   Consultants:  [place] / Hospitalist service  Procedures:  Left total knee arthroplasty 11/15  Antimicrobials:  Anti-infectives (From admission, onward)    Start     Dose/Rate Route Frequency Ordered Stop   11/13/21 1400  ceFAZolin (ANCEF) IVPB 2g/100 mL premix        2 g 200 mL/hr over 30 Minutes Intravenous Every 6 hours 11/13/21 1259 11/13/21 2008   11/13/21 0614  clindamycin (CLEOCIN) 600 MG/50ML IVPB       Note to Pharmacy: Maryagnes Amos   :  cabinet override      11/13/21 0614 11/13/21 0954   11/13/21 0614  ceFAZolin (ANCEF) 2-4 GM/100ML-% IVPB       Note to Pharmacy: Maryagnes Amos   : cabinet override      11/13/21 0614 11/13/21 0829   11/13/21 0600  ceFAZolin (ANCEF) IVPB 2g/100 mL premix        2 g 200 mL/hr over 30 Minutes Intravenous On call to O.R. 11/13/21 0350 11/13/21 0859   11/13/21 0400  clindamycin (CLEOCIN) IVPB 600 mg        600 mg 100 mL/hr over 30 Minutes Intravenous  Once 11/13/21 0350 11/13/21 0820         Micro     Objective   Vitals:   11/14/21 1400 11/14/21 1500 11/14/21 1600 11/14/21 1700  BP: (!) 101/54 (!) 99/56 (!) 106/58 (!) 92/56  Pulse: 69 75 74 64  Resp: 20 16 17 15   Temp:    99.1 F (37.3 C)  TempSrc:    Oral  SpO2: 95% 96% 95% 95%  Weight:      Height:        Intake/Output Summary (Last 24 hours) at 11/14/2021 1819 Last data filed at 11/14/2021 1718 Gross per 24 hour  Intake 1555.9 ml  Output 2450 ml  Net -894.1 ml   Filed Weights   11/13/21 0623  Weight: 80.3 kg    Physical Exam:  General exam: awake, alert, no acute distress HEENT: atraumatic, clear conjunctiva, anicteric sclera, moist mucus membranes, hearing grossly normal  Respiratory system: CTAB with diminished bases, no wheezes, rales or rhonchi, normal respiratory effort. Cardiovascular system: normal S1/S2, RRR, no JVD, murmurs, rubs, gallops, no pedal edema.   Central nervous system: A&O x3. no gross focal neurologic deficits, normal speech Skin: dry, intact, normal temperature, normal color Psychiatry: normal mood, congruent affect, judgement and insight appear normal  Labs   Data Reviewed: I have personally reviewed following labs and imaging studies  CBC: Recent Labs  Lab 11/13/21 2308 11/14/21 0039  WBC 9.1 8.8  HGB 11.5*  11.4* 12.0  HCT 35.5*  35.0* 36.4  MCV 93.2 93.1  PLT 126* 203*   Basic Metabolic Panel: Recent Labs  Lab 11/13/21 2308 11/14/21 0039  NA 138 137  K 3.9 3.7  CL 108 107  CO2 23 25  GLUCOSE 136* 120*  BUN 7* 7*  CREATININE 0.47 0.55  CALCIUM 8.4* 8.4*   GFR: Estimated Creatinine Clearance: 69.8 mL/min (by C-G formula based on SCr of 0.55 mg/dL). Liver Function Tests: Recent Labs  Lab 11/13/21 2308  AST 20  ALT 19  ALKPHOS 59  BILITOT 0.8  PROT 5.2*  ALBUMIN 3.0*   No results for input(s): LIPASE, AMYLASE in the last 168 hours. No results for input(s): AMMONIA in the last 168 hours. Coagulation Profile: No results for input(s): INR, PROTIME in  the last 168 hours. Cardiac Enzymes: No results for input(s): CKTOTAL, CKMB, CKMBINDEX, TROPONINI in the last 168 hours. BNP (last 3 results) No results for input(s): PROBNP in the last 8760 hours. HbA1C: No results for input(s): HGBA1C in the last 72 hours. CBG: Recent Labs  Lab 11/13/21 1520 11/14/21 0027  GLUCAP 116* 117*   Lipid Profile: No results for input(s): CHOL, HDL, LDLCALC, TRIG, CHOLHDL, LDLDIRECT in the last 72 hours. Thyroid Function Tests: Recent Labs    11/13/21 2308  TSH 2.184  FREET4 0.76   Anemia Panel: Recent Labs    11/13/21 2308  VITAMINB12 253  Sepsis Labs: No results for input(s): PROCALCITON, LATICACIDVEN in the last 168 hours.  Recent Results (from the past 240 hour(s))  SARS CORONAVIRUS 2 (TAT 6-24 HRS) Nasopharyngeal Nasopharyngeal Swab     Status: None   Collection Time: 11/09/21  8:15 AM   Specimen: Nasopharyngeal Swab  Result Value Ref Range Status   SARS Coronavirus 2 NEGATIVE NEGATIVE Final    Comment: (NOTE) SARS-CoV-2 target nucleic acids are NOT DETECTED.  The SARS-CoV-2 RNA is generally detectable in upper and lower respiratory specimens during the acute phase of infection. Negative results do not preclude SARS-CoV-2 infection, do not rule out co-infections with other pathogens, and should not be used as the sole basis for treatment or other patient management decisions. Negative results must be combined with clinical observations, patient history, and epidemiological information. The expected result is Negative.  Fact Sheet for Patients: SugarRoll.be  Fact Sheet for Healthcare Providers: https://www.woods-mathews.com/  This test is not yet approved or cleared by the [place] FDA and  has been authorized for detection and/or diagnosis of SARS-CoV-2 by FDA under an Emergency Use Authorization (EUA). This EUA will remain  in effect (meaning this test can be used) for the  duration of the COVID-19 declaration under Se ction 564(b)(1) of the Act, 21 U.S.C. section 360bbb-3(b)(1), unless the authorization is terminated or revoked sooner.  Performed at [facility], [place] [address], [place], [place] [address]       Imaging Studies   CT Angio Chest Pulmonary Embolism (PE) W or WO Contrast  Result Date: 11/14/2021 CLINICAL DATA:  Right-sided chest pain and shortness of breath, initial encounter EXAM: CT ANGIOGRAPHY CHEST WITH CONTRAST TECHNIQUE: Multidetector CT imaging of the chest was performed using the standard protocol during bolus administration of intravenous contrast. Multiplanar CT image reconstructions and MIPs were obtained to evaluate the vascular anatomy. CONTRAST:  21mL OMNIPAQUE IOHEXOL 350 MG/ML SOLN COMPARISON:  04/03/2021 chest x-ray FINDINGS: Cardiovascular: Atherosclerotic calcifications of the thoracic aorta are noted. No aneurysmal dilatation or dissection is noted. No cardiac enlargement is seen. Pulmonary artery demonstrates a normal branching pattern bilaterally. No intraluminal filling defect to suggest pulmonary embolism is seen. No coronary calcifications are noted. Mediastinum/Nodes: Thoracic inlet is within normal limits. No sizable hilar or mediastinal adenopathy is noted. The esophagus as visualized is within normal limits. Lungs/Pleura: The lungs are well aerated bilaterally with mild dependent atelectatic changes. No focal confluent infiltrate is seen. No sizable effusion or pneumothorax is noted. No parenchymal nodules are seen. Upper Abdomen: Visualized upper abdomen appears within normal limits. Musculoskeletal: Degenerative changes of the thoracic spine are noted. No definitive rib abnormality is seen. Review of the MIP images confirms the above findings. IMPRESSION: No evidence of pulmonary emboli. Mild dependent atelectatic changes. Electronically Signed   By: Inez Catalina M.D.   On: 11/14/2021 00:26   DG Knee Left  Port  Result Date: 11/13/2021 CLINICAL DATA:  Status post left knee replacement EXAM: PORTABLE LEFT KNEE - 1-2 VIEW COMPARISON:  None. FINDINGS: The patient is status post left total knee arthroplasty. Hardware alignment is within expected limits, without evidence of complication. There is expected surrounding soft tissue swelling and soft tissue gas. IMPRESSION: Status post left knee arthroplasty without evidence of complication. Electronically Signed   By: Valetta Mole M.D.   On: 11/13/2021 11:29     Medications   Scheduled Meds:  calcium-vitamin D  1 tablet Oral Daily   Chlorhexidine Gluconate Cloth  6 each Topical Daily   docusate  sodium  100 mg Oral BID   enoxaparin (LOVENOX) injection  40 mg Subcutaneous Q24H   fluticasone  1 spray Each Nare Daily   loratadine  10 mg Oral Daily   multivitamin with minerals  1 tablet Oral Daily   pantoprazole  40 mg Oral BID   traMADol  50 mg Oral Q6H   Continuous Infusions:  methocarbamol (ROBAXIN) IV         LOS: 0 days    Time spent: 30 minutes    Ezekiel Slocumb, DO [facility]  11/14/2021, 6:19 PM      If 7PM-7AM, please contact night-coverage. How to contact the [facility] Attending or Consulting provider [place] or covering provider during after hours [place], for this patient?    Check the care team in [facility] and look for a) attending/consulting [facility] provider listed and b) the [facility] team listed Log into [email or web address] and use [place] universal password to access. If you do not have the password, please contact the hospital operator. Locate the [facility] provider you are looking for under [facility] and page to a number that you can be directly reached. If you still have difficulty reaching the provider, please page the [facility] (Director on Call) for the Hospitalists listed on amion for assistance.

## 2021-11-15 ENCOUNTER — Telehealth: Payer: Self-pay | Admitting: Cardiology

## 2021-11-15 DIAGNOSIS — I4729 Other ventricular tachycardia: Secondary | ICD-10-CM

## 2021-11-15 LAB — CBC
HCT: 34.8 % — ABNORMAL LOW (ref 36.0–46.0)
Hemoglobin: 11.4 g/dL — ABNORMAL LOW (ref 12.0–15.0)
MCH: 29.7 pg (ref 26.0–34.0)
MCHC: 32.8 g/dL (ref 30.0–36.0)
MCV: 90.6 fL (ref 80.0–100.0)
Platelets: 125 10*3/uL — ABNORMAL LOW (ref 150–400)
RBC: 3.84 MIL/uL — ABNORMAL LOW (ref 3.87–5.11)
RDW: 12.8 % (ref 11.5–15.5)
WBC: 8.8 10*3/uL (ref 4.0–10.5)
nRBC: 0 % (ref 0.0–0.2)

## 2021-11-15 LAB — ECHOCARDIOGRAM COMPLETE
Area-P 1/2: 3.72 cm2
Height: 62 in
S' Lateral: 3.3 cm
Weight: 2832 oz

## 2021-11-15 LAB — MAGNESIUM: Magnesium: 1.9 mg/dL (ref 1.7–2.4)

## 2021-11-15 MED ORDER — POTASSIUM CHLORIDE CRYS ER 20 MEQ PO TBCR
40.0000 meq | EXTENDED_RELEASE_TABLET | Freq: Once | ORAL | Status: AC
  Administered 2021-11-15: 15:00:00 40 meq via ORAL
  Filled 2021-11-15: qty 2

## 2021-11-15 MED ORDER — OXYCODONE HCL 5 MG PO TABS: 5.0000 mg | ORAL_TABLET | ORAL | 0 refills | Status: DC | PRN

## 2021-11-15 MED ORDER — ASPIRIN EC 325 MG PO TBEC: 325.0000 mg | DELAYED_RELEASE_TABLET | Freq: Two times a day (BID) | ORAL | 0 refills | Status: DC

## 2021-11-15 MED ORDER — MAGNESIUM SULFATE IN D5W 1-5 GM/100ML-% IV SOLN
1.0000 g | Freq: Once | INTRAVENOUS | Status: AC
  Administered 2021-11-15: 15:00:00 1 g via INTRAVENOUS
  Filled 2021-11-15: qty 100

## 2021-11-15 NOTE — Telephone Encounter (Signed)
LMOV  

## 2021-11-15 NOTE — Progress Notes (Signed)
Physical Therapy Treatment Patient Details Name: Victoria Benson MRN: [id number] DOB: March 15, 1956 Today's Date: 11/15/2021   History of Present Illness Pt is a 65 y.o. female who presents with history of breast cancer, lymphoma, and skin cancer with a diagnosis of left knee osteoarthritis and is now s/p L TKA.    PT Comments    Pt was sitting in recliner upon arriving. She agrees to session an is cooperative and motivate throughout. BP continued to be stable without symptoms of orthostatic hypotension. She stood to RW and ambulated > 200 ft. Performed stairs without safety concern prior to performing HEP exercises. Pt is progressing well. Recommend DC home with outpatient PT to continue to progress ROM, strength, and overall safety/ independence with ADLs.    Recommendations for follow up therapy are one component of a multi-disciplinary discharge planning process, led by the attending physician.  Recommendations may be updated based on patient status, additional functional criteria and insurance authorization.  Follow Up Recommendations  Outpatient PT (pt does not have HHPT benefits but plans to go to outpatient)     Assistance Recommended at Discharge Frequent or constant Supervision/Assistance  Equipment Recommendations  None recommended by PT       Precautions / Restrictions Precautions Precautions: Knee Precaution Booklet Issued: Yes (comment) Required Braces or Orthoses: Knee Immobilizer - Left Restrictions Weight Bearing Restrictions: Yes LLE Weight Bearing: Weight bearing as tolerated Other Position/Activity Restrictions: in bed only     Mobility  Bed Mobility Overal bed mobility: Needs Assistance Bed Mobility: Supine to Sit;Sit to Supine     Supine to sit: Supervision Sit to supine: Supervision   General bed mobility comments: seated in recliner chair    Transfers Overall transfer level: Needs assistance Equipment used: Rolling walker (2 wheels) Transfers: Sit  to/from Stand Sit to Stand: Min guard           General transfer comment: Min verbal cues for sequencing    Ambulation/Gait Ambulation/Gait assistance: Supervision Gait Distance (Feet): 200 Feet Assistive device: Rolling walker (2 wheels) Gait Pattern/deviations: Step-to pattern;Antalgic;Decreased step length - right;Decreased stride length Gait velocity: decreased     General Gait Details: Pt was easily and safely able to ambulate 200 ft with RW wihtout LOB or safety concern   Stairs Stairs: Yes Stairs assistance: Min guard;Supervision Stair Management: One rail Left;Sideways Number of Stairs: 8 General stair comments: pt demonstrrated safe ability to ascend/descend 8 steps wihtou LOB or saftey concern     Balance Overall balance assessment: Needs assistance Sitting-balance support: Bilateral upper extremity supported;Feet supported Sitting balance-Leahy Scale: Good     Standing balance support: Bilateral upper extremity supported;During functional activity Standing balance-Leahy Scale: Good Standing balance comment: Min to mod lean on the RW for support      Cognition Arousal/Alertness: Awake/alert Behavior During Therapy: WFL for tasks assessed/performed Overall Cognitive Status: Within Functional Limits for tasks assessed      General Comments: Pt is A and O x 4        Exercises Total Joint Exercises Ankle Circles/Pumps: AROM;10 reps Quad Sets: AROM;10 reps Gluteal Sets: AROM;10 reps Short Arc Quad: AROM;10 reps Heel Slides: AROM;10 reps Hip ABduction/ADduction: AAROM;10 reps Straight Leg Raises: AAROM;10 reps        Pertinent Vitals/Pain Pain Assessment: 0-10 Pain Score: 3  Breathing: normal Pain Location: L knee Pain Descriptors / Indicators: Discomfort;Grimacing Pain Intervention(s): Limited activity within patient's tolerance;Repositioned;Monitored during session;Premedicated before session    Home Living Family/patient expects to be  discharged to::  Private residence Living Arrangements: Spouse/significant other Available Help at Discharge: Family;Available 24 hours/day Type of Home: House Home Access: Level entry     Alternate Level Stairs-Number of Steps: Flight; ~15 steps Home Layout: Two level Home Equipment: Conservation officer, nature (2 wheels);BSC/3in1;Grab bars - toilet Additional Comments: Said they have access to additional equipment through their church    Prior Function            PT Goals (current goals can now be found in the care plan section) Acute Rehab PT Goals Patient Stated Goal: go home when ready Progress towards PT goals: Progressing toward goals    Frequency    BID      PT Plan Current plan remains appropriate       AM-PAC PT "6 Clicks" Mobility   Outcome Measure  Help needed turning from your back to your side while in a flat bed without using bedrails?: A Little Help needed moving from lying on your back to sitting on the side of a flat bed without using bedrails?: A Little Help needed moving to and from a bed to a chair (including a wheelchair)?: A Little Help needed standing up from a chair using your arms (e.g., wheelchair or bedside chair)?: A Little Help needed to walk in hospital room?: A Little Help needed climbing 3-5 steps with a railing? : A Little 6 Click Score: 18    End of Session Equipment Utilized During Treatment: Gait belt Activity Tolerance: Patient tolerated treatment well Patient left: in bed;with call bell/phone within reach;with bed alarm set;with family/visitor present;with SCD's reapplied Nurse Communication: Mobility status PT Visit Diagnosis: Other abnormalities of gait and mobility (R26.89);Pain;Muscle weakness (generalized) (M62.81) Pain - Right/Left: Left Pain - part of body: Knee     Time: 3295-1884 PT Time Calculation (min) (ACUTE ONLY): 32 min  Charges:  $Gait Training: 8-22 mins $Therapeutic Exercise: 8-22 mins                     Julaine Fusi PTA 11/15/21, 5:01 PM

## 2021-11-15 NOTE — Discharge Summary (Signed)
Physician Discharge Summary  Patient ID: Victoria Benson MRN: [id number] DOB/AGE: 64-24-1957 64 y.o.  Admit date: 11/13/2021 Discharge date: 11/15/2021  Admission Diagnoses:  Left Knee Osteoarthritis Hypotension  Discharge Diagnoses:  Left Knee Osteoarthritis Principal Problem:   Hypotension Active Problems:   Subclinical hypothyroidism   Other chest pain   S/P total knee arthroplasty, left   NSVT (nonsustained ventricular tachycardia)   Past Medical History:  Diagnosis Date   BRCA gene mutation negative 07/10/2016   MyRisk neg   Breast cancer ([place]) 2009   T1c (1.2 cm) N0 ER 90%, PR 90%, HER-2/neu not amplified. Oncotype DX score, low risk (14) 9% chance of recurrence with antiestrogen therapy alone.   Breast cancer ([place]) 06/17/2016   INVASIVE MAMMARY CARCINOMA . T1, ER positive, PR positive, HER-2/neu not overexpressed   Diffuse large B-cell lymphoma ([place]) 11/2017   Right cervical adenopathy   Family history of adverse reaction to anesthesia    PTS MOM-HARD TO WAKE UP   Heart murmur    History of basal cell carcinoma 02/11/2011   right upper back   History of dysplastic nevus 06/20/2011   left scapula   Osteoarthritis    left knee   Personal history of chemotherapy 2019   lymphoma   Personal history of radiation therapy 2009   right breast cancer   Personal history of radiation therapy 2017   right breast cancer   PONV (postoperative nausea and vomiting)    Radiation 2009   BREAST CA   Skin cancer     Surgeries: Procedure(s): LEFT TOTAL KNEE ARTHROPLASTY on 11/13/2021   Consultants (if any):   Discharged Condition: Improved  Hospital Course: Victoria Benson is an 65 y.o. female who was admitted 11/13/2021 with a diagnosis of  Left Knee Osteoarthritis Hypotension and went to the operating room on 11/13/2021 and underwent an uncomplicated left total knee arthroplasty.    She was given perioperative antibiotics:  Anti-infectives (From admission, onward)     Start     Dose/Rate Route Frequency Ordered Stop   11/13/21 1400  ceFAZolin (ANCEF) IVPB 2g/100 mL premix        2 g 200 mL/hr over 30 Minutes Intravenous Every 6 hours 11/13/21 1259 11/13/21 2008   11/13/21 0614  clindamycin (CLEOCIN) 600 MG/50ML IVPB       Note to Pharmacy: Maryagnes Amos   : cabinet override      11/13/21 0614 11/13/21 0954   11/13/21 0614  ceFAZolin (ANCEF) 2-4 GM/100ML-% IVPB       Note to Pharmacy: Maryagnes Amos   : cabinet override      11/13/21 0614 11/13/21 0829   11/13/21 0600  ceFAZolin (ANCEF) IVPB 2g/100 mL premix        2 g 200 mL/hr over 30 Minutes Intravenous On call to O.R. 11/13/21 0350 11/13/21 0859   11/13/21 0400  clindamycin (CLEOCIN) IVPB 600 mg        600 mg 100 mL/hr over 30 Minutes Intravenous  Once 11/13/21 0350 11/13/21 0820     .  She was given sequential compression devices, early ambulation, and Lovenox for DVT prophylaxis.  The afternoon of surgery the patient had a syncopal episode when first getting up with PT.  Later that night patient had hypotension.  A hospitalist consult was ordered and the patient was transferred to ICU stepdown for monitoring of her blood pressure.  Patient did not require pressors while an inpatient.  Patient improved with fluid support.  On postop day #  2 patient had a 16 beat run of V. tach.  Dr. Rockey Situ from cardiology was curb sided by Dr. Arbutus Ped, the hospitalist.  Dr. Rockey Situ recommended repletion of magnesium and potassium.  Patient had no symptoms of shortness of breath or chest pain during her hospitalization.  Given the patient's clinical improvement she was prepared for discharge after she was medically cleared.  She benefited maximally from the hospital stay and there were no complications.    Recent vital signs:  Vitals:   11/15/21 0748 11/15/21 1524  BP: (!) 103/59 (!) 99/58  Pulse: 82 88  Resp: 19 16  Temp: 98.5 F (36.9 C) 99.7 F (37.6 C)  SpO2: 100% 98%    Recent laboratory  studies:  Lab Results  Component Value Date   HGB 11.4 (L) 11/15/2021   HGB 12.0 11/14/2021   HGB 11.4 (L) 11/13/2021   HGB 11.5 (L) 11/13/2021   Lab Results  Component Value Date   WBC 8.8 11/15/2021   PLT 125 (L) 11/15/2021   Lab Results  Component Value Date   INR 1.0 10/31/2021   Lab Results  Component Value Date   NA 137 11/14/2021   K 3.7 11/14/2021   CL 107 11/14/2021   CO2 25 11/14/2021   BUN 7 (L) 11/14/2021   CREATININE 0.55 11/14/2021   GLUCOSE 120 (H) 11/14/2021    Discharge Medications:   Allergies as of 11/15/2021   No Known Allergies      Medication List     STOP taking these medications    meloxicam 7.5 MG tablet Commonly known as: MOBIC       TAKE these medications    aspirin EC 325 MG tablet Take 1 tablet (325 mg total) by mouth 2 (two) times daily.   CALCIUM 600/VITAMIN D PO Take 1 tablet by mouth daily.   cetirizine 10 MG tablet Commonly known as: ZYRTEC Take 10 mg daily by mouth.   fluticasone 50 MCG/ACT nasal spray Commonly known as: FLONASE USE 2 SPRAYS INTO BOTH NOSTRILS DAILY What changed: See the new instructions.   multivitamin capsule Take 1 capsule by mouth daily.   Omega-3 Krill Oil 500 MG Caps Take 500 mg by mouth daily.   oxyCODONE 5 MG immediate release tablet Commonly known as: Oxy IR/ROXICODONE Take 1-2 tablets (5-10 mg total) by mouth every 4 (four) hours as needed.        Diagnostic Studies: CT Angio Chest Pulmonary Embolism (PE) W or WO Contrast  Result Date: 11/14/2021 CLINICAL DATA:  Right-sided chest pain and shortness of breath, initial encounter EXAM: CT ANGIOGRAPHY CHEST WITH CONTRAST TECHNIQUE: Multidetector CT imaging of the chest was performed using the standard protocol during bolus administration of intravenous contrast. Multiplanar CT image reconstructions and MIPs were obtained to evaluate the vascular anatomy. CONTRAST:  62m OMNIPAQUE IOHEXOL 350 MG/ML SOLN COMPARISON:  04/03/2021 chest  x-ray FINDINGS: Cardiovascular: Atherosclerotic calcifications of the thoracic aorta are noted. No aneurysmal dilatation or dissection is noted. No cardiac enlargement is seen. Pulmonary artery demonstrates a normal branching pattern bilaterally. No intraluminal filling defect to suggest pulmonary embolism is seen. No coronary calcifications are noted. Mediastinum/Nodes: Thoracic inlet is within normal limits. No sizable hilar or mediastinal adenopathy is noted. The esophagus as visualized is within normal limits. Lungs/Pleura: The lungs are well aerated bilaterally with mild dependent atelectatic changes. No focal confluent infiltrate is seen. No sizable effusion or pneumothorax is noted. No parenchymal nodules are seen. Upper Abdomen: Visualized upper abdomen appears within normal limits.  Musculoskeletal: Degenerative changes of the thoracic spine are noted. No definitive rib abnormality is seen. Review of the MIP images confirms the above findings. IMPRESSION: No evidence of pulmonary emboli. Mild dependent atelectatic changes. Electronically Signed   By: Inez Catalina M.D.   On: 11/14/2021 00:26   DG Knee Left Port  Result Date: 11/13/2021 CLINICAL DATA:  Status post left knee replacement EXAM: PORTABLE LEFT KNEE - 1-2 VIEW COMPARISON:  None. FINDINGS: The patient is status post left total knee arthroplasty. Hardware alignment is within expected limits, without evidence of complication. There is expected surrounding soft tissue swelling and soft tissue gas. IMPRESSION: Status post left knee arthroplasty without evidence of complication. Electronically Signed   By: Valetta Mole M.D.   On: 11/13/2021 11:29   MM 3D SCREEN BREAST BILATERAL  Result Date: 10/29/2021 CLINICAL DATA:  Screening. EXAM: DIGITAL SCREENING BILATERAL MAMMOGRAM WITH TOMOSYNTHESIS AND CAD TECHNIQUE: Bilateral screening digital craniocaudal and mediolateral oblique mammograms were obtained. Bilateral screening digital breast  tomosynthesis was performed. The images were evaluated with computer-aided detection. COMPARISON:  Previous exam(s). ACR Breast Density Category b: There are scattered areas of fibroglandular density. FINDINGS: There are no findings suspicious for malignancy. IMPRESSION: No mammographic evidence of malignancy. A result letter of this screening mammogram will be mailed directly to the patient. RECOMMENDATION: Screening mammogram in one year. (Code:SM-B-01Y) BI-RADS CATEGORY  1: Negative. Electronically Signed   By: Ammie Ferrier M.D.   On: 10/29/2021 09:19  ECHOCARDIOGRAM COMPLETE  Result Date: 11/15/2021    ECHOCARDIOGRAM REPORT   Patient Name:   Victoria Benson [facility] Date of Exam: 11/14/2021 Medical Rec #:  [id number]    Height:       62.0 in Accession #:    [id number]   Weight:       177.0 lb Date of Birth:  June 17, 1956    BSA:          1.815 m Patient Age:    68 years     BP:           108/64 mmHg Patient Gender: F            HR:           73 bpm. Exam Location:  [facility] Procedure: 2D Echo, Cardiac Doppler and Color Doppler Indications:     R07.9 Chest pain  History:         Patient has no prior history of Echocardiogram examinations.                  Signs/Symptoms:Murmur. Remote history of breast cancer.  Sonographer:     Cresenciano Lick RDCS Referring Phys:  [place] Diagnosing Phys: Ida Rogue MD IMPRESSIONS  1. Left ventricular ejection fraction, by estimation, is 60 to 65%. The left ventricle has normal function. The left ventricle has no regional wall motion abnormalities. Left ventricular diastolic parameters are consistent with Grade II diastolic dysfunction (pseudonormalization).  2. Right ventricular systolic function is normal. The right ventricular size is normal.  3. Left atrial size was mildly dilated.  4. The mitral valve is normal in structure. Mild mitral valve regurgitation. No evidence of mitral stenosis.  5. The aortic valve is normal in structure. Aortic valve regurgitation  is not visualized. Aortic valve sclerosis is present, with no evidence of aortic valve stenosis.  6. The inferior vena cava is normal in size with greater than 50% respiratory variability, suggesting right atrial pressure of 3 mmHg. FINDINGS  Left Ventricle: Left ventricular  ejection fraction, by estimation, is 60 to 65%. The left ventricle has normal function. The left ventricle has no regional wall motion abnormalities. The left ventricular internal cavity size was normal in size. There is  no left ventricular hypertrophy. Left ventricular diastolic parameters are consistent with Grade II diastolic dysfunction (pseudonormalization). Right Ventricle: The right ventricular size is normal. No increase in right ventricular wall thickness. Right ventricular systolic function is normal. Left Atrium: Left atrial size was mildly dilated. Right Atrium: Right atrial size was normal in size. Pericardium: There is no evidence of pericardial effusion. Mitral Valve: The mitral valve is normal in structure. Mild mitral valve regurgitation. No evidence of mitral valve stenosis. Tricuspid Valve: The tricuspid valve is normal in structure. Tricuspid valve regurgitation is mild . No evidence of tricuspid stenosis. Aortic Valve: The aortic valve is normal in structure. Aortic valve regurgitation is not visualized. Aortic valve sclerosis is present, with no evidence of aortic valve stenosis. Pulmonic Valve: The pulmonic valve was normal in structure. Pulmonic valve regurgitation is not visualized. No evidence of pulmonic stenosis. Aorta: The aortic root is normal in size and structure. Venous: The inferior vena cava is normal in size with greater than 50% respiratory variability, suggesting right atrial pressure of 3 mmHg. IAS/Shunts: No atrial level shunt detected by color flow Doppler.  LEFT VENTRICLE PLAX 2D LVIDd:         4.80 cm   Diastology LVIDs:         3.30 cm   LV e' medial:    9.46 cm/s LV PW:         0.80 cm   LV E/e'  medial:  12.5 LV IVS:        0.80 cm   LV e' lateral:   11.00 cm/s LVOT diam:     2.20 cm   LV E/e' lateral: 10.7 LV SV:         107 LV SV Index:   59 LVOT Area:     3.80 cm  RIGHT VENTRICLE             IVC RV Basal diam:  2.70 cm     IVC diam: 1.20 cm RV S prime:     13.30 cm/s TAPSE (M-mode): 2.2 cm LEFT ATRIUM             Index        RIGHT ATRIUM           Index LA diam:        4.00 cm 2.20 cm/m   RA Area:     11.90 cm LA Vol (A2C):   31.1 ml 17.14 ml/m  RA Volume:   28.20 ml  15.54 ml/m LA Vol (A4C):   32.6 ml 17.96 ml/m LA Biplane Vol: 31.9 ml 17.58 ml/m  AORTIC VALVE LVOT Vmax:   144.00 cm/s LVOT Vmean:  96.800 cm/s LVOT VTI:    0.281 m  AORTA Ao Root diam: 2.80 cm Ao Asc diam:  3.10 cm MITRAL VALVE MV Area (PHT): 3.72 cm     SHUNTS MV Decel Time: 204 msec     Systemic VTI:  0.28 m MV E velocity: 118.00 cm/s  Systemic Diam: 2.20 cm MV A velocity: 99.20 cm/s MV E/A ratio:  1.19 Ida Rogue MD Electronically signed by Ida Rogue MD Signature Date/Time: 11/15/2021/7:55:02 AM    Final     Disposition: Discharge disposition: 01-Home or Self Care       Discharge Instructions  Call MD / Call 911   Complete by: As directed    If you experience chest pain or shortness of breath, CALL 911 and be transported to the hospital emergency room.  If you develope a fever above 101 F, pus (white drainage) or increased drainage or redness at the wound, or calf pain, call your surgeon's office.   Constipation Prevention   Complete by: As directed    Drink plenty of fluids.  Prune juice may be helpful.  You may use a stool softener, such as Colace (over the counter) 100 mg twice a day.  Use MiraLax (over the counter) for constipation as needed.   Diet - low sodium heart healthy   Complete by: As directed    Discharge instructions   Complete by: As directed    Follow-up with Dr. Mack Guise in the office on 11-26-2021 @ 11:30 AM  The patient may continue to bear weight on the left lower  extremity with use of a walker. The patient should continue to use TED stockings until follow-up. Patient should remove the TED stockings at night for sleep. The patient needs to continue to elevate the lef lower extremity whenever possible. The knee immobilizer should be used at night. The patient may remove the knee immobilizer to perform exercises or sit in a chair during the day.  Patient should not place a pillow under their knee. The Polar Care may be used by the patient for comfort.  The dressing should remain on until follow up in the office.   The patient must cover the left knee dressing/incision during showers with a plastic bag or Saran wrap.  The patient will take aspirin 325 mg twice a day for blood clot prevention and continue to work on knee range of motion exercises at home as instructed by physical therapy until follow-up in the office.   Increase activity slowly as tolerated   Complete by: As directed    Post-operative opioid taper instructions:   Complete by: As directed    POST-OPERATIVE OPIOID TAPER INSTRUCTIONS: It is important to wean off of your opioid medication as soon as possible. If you do not need pain medication after your surgery it is ok to stop day one. Opioids include: Codeine, Hydrocodone(Norco, Vicodin), Oxycodone(Percocet, oxycontin) and hydromorphone amongst others.  Long term and even short term use of opiods can cause: Increased pain response Dependence Constipation Depression Respiratory depression And more.  Withdrawal symptoms can include Flu like symptoms Nausea, vomiting And more Techniques to manage these symptoms Hydrate well Eat regular healthy meals Stay active Use relaxation techniques(deep breathing, meditating, yoga) Do Not substitute Alcohol to help with tapering If you have been on opioids for less than two weeks and do not have pain than it is ok to stop all together.  Plan to wean off of opioids This plan should start within one  week post op of your joint replacement. Maintain the same interval or time between taking each dose and first decrease the dose.  Cut the total daily intake of opioids by one tablet each day Next start to increase the time between doses. The last dose that should be eliminated is the evening dose.             Signed: Thornton Park ,MD 11/15/2021, 4:04 PM

## 2021-11-15 NOTE — Telephone Encounter (Signed)
-----   Message from Minna Merritts, MD sent at 11/15/2021 12:15 PM EST ----- Needs new patient appt, has not been seen by [place] Zio being put on, so appt in 4 weeks Thx TG

## 2021-11-15 NOTE — Evaluation (Signed)
Occupational Therapy Evaluation Patient Details Name: Victoria Benson MRN: [id number] DOB: 02/15/1956 Today's Date: 11/15/2021   History of Present Illness Pt is a 65 y.o. female who presents with history of breast cancer, lymphoma, and skin cancer with a diagnosis of left knee osteoarthritis and is now s/p L TKA.   Clinical Impression   Patient presenting with decreased Ind in self care, balance, functional mobility/transfers, endurance, and safety awareness.Patient reports being independent at baseline without use of AD. 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 Patient currently functioning at supervision - min A for functional transfers and LB self care. OT educated pt and husband on polar care use and how to increase independence in LB self care. Paper handout provided as well. Biggest barrier at home seems to be her bedroom being on 2nd level but pt will be attempting steps during next session with PT. Pt very motivated and pleasant. Patient will benefit from acute OT to increase overall independence in the areas of ADLs, functional mobility, and safety awareness in order to safely discharge home with family.      Recommendations for follow up therapy are one component of a multi-disciplinary discharge planning process, led by the attending physician.  Recommendations may be updated based on patient status, additional functional criteria and insurance authorization.   Follow Up Recommendations  No OT follow up    Assistance Recommended at Discharge Intermittent Supervision/Assistance  Functional Status Assessment  Patient has had a recent decline in their functional status and demonstrates the ability to make significant improvements in function in a reasonable and predictable amount of time.  Equipment Recommendations  None recommended by OT       Precautions / Restrictions Precautions Precautions: Knee Precaution Booklet Issued: Yes (comment) Required Braces or  Orthoses: Knee Immobilizer - Left Restrictions Weight Bearing Restrictions: Yes LLE Weight Bearing: Weight bearing as tolerated Other Position/Activity Restrictions: KI on in bed only to promote knee ext per Dr. Mack Guise      Mobility Bed Mobility Overal bed mobility: Needs Assistance Bed Mobility: Supine to Sit;Sit to Supine     Supine to sit: Supervision Sit to supine: Supervision   General bed mobility comments: seated in recliner chair    Transfers Overall transfer level: Needs assistance Equipment used: Rolling walker (2 wheels) Transfers: Sit to/from Stand Sit to Stand: Min assist           General transfer comment: Min verbal cues for sequencing      Balance Overall balance assessment: Needs assistance Sitting-balance support: Bilateral upper extremity supported;Feet supported Sitting balance-Leahy Scale: Good     Standing balance support: Bilateral upper extremity supported;During functional activity Standing balance-Leahy Scale: Good Standing balance comment: Min to mod lean on the RW for support                           ADL either performed or assessed with clinical judgement   ADL Overall ADL's : Needs assistance/impaired                     Lower Body Dressing: Moderate assistance;Sit to/from stand Lower Body Dressing Details (indicate cue type and reason): assist to thread onto L LE Toilet Transfer: Minimal assistance;Rolling walker (2 wheels) Toilet Transfer Details (indicate cue type and reason): simulated                 Vision Baseline Vision/History: 1 Wears glasses Patient Visual Report:  No change from baseline              Pertinent Vitals/Pain Pain Assessment: 0-10 Pain Score: 5  Pain Location: L knee Pain Descriptors / Indicators: Discomfort;Grimacing Pain Intervention(s): Limited activity within patient's tolerance;Monitored during session;Premedicated before session;Repositioned;Ice applied      Hand Dominance Right   Extremity/Trunk Assessment Upper Extremity Assessment Upper Extremity Assessment: Overall WFL for tasks assessed   Lower Extremity Assessment Lower Extremity Assessment: LLE deficits/detail LLE Deficits / Details: BLE sensation to light touch and ankle strength and AROM all grossly WNL LLE: Unable to fully assess due to pain LLE Sensation: WNL LLE Coordination: WNL       Communication Communication Communication: No difficulties   Cognition Arousal/Alertness: Awake/alert Behavior During Therapy: WFL for tasks assessed/performed Overall Cognitive Status: Within Functional Limits for tasks assessed                                 General Comments: Pt is A and O x 4                Home Living Family/patient expects to be discharged to:: Private residence Living Arrangements: Spouse/significant other Available Help at Discharge: Family;Available 24 hours/day Type of Home: House Home Access: Level entry     Home Layout: Two level Alternate Level Stairs-Number of Steps: Flight; ~15 steps   Bathroom Shower/Tub: Teacher, early years/pre: Handicapped height Bathroom Accessibility: Yes   Home Equipment: Conservation officer, nature (2 wheels);BSC/3in1;Grab bars - toilet   Additional Comments: Michela Pitcher they have access to additional equipment through their church      Prior Functioning/Environment Prior Level of Function : Independent/Modified Independent             Mobility Comments: Did not require AD, no history of falls ADLs Comments: Did not require assistance. She reports being a caregiver for her mother living with her.        OT Problem List: Decreased strength;Decreased activity tolerance;Impaired balance (sitting and/or standing);Decreased safety awareness;Decreased knowledge of use of DME or AE;Decreased knowledge of precautions;Cardiopulmonary status limiting activity;Pain;Decreased range of motion      OT  Treatment/Interventions: Self-care/ADL training;Therapeutic exercise;Therapeutic activities;DME and/or AE instruction;Patient/family education;Balance training;Manual therapy;Energy conservation    OT Goals(Current goals can be found in the care plan section) Acute Rehab OT Goals Patient Stated Goal: to go home OT Goal Formulation: With patient Time For Goal Achievement: 11/29/21 Potential to Achieve Goals: Good ADL Goals Pt Will Perform Grooming: with modified independence;sitting Pt Will Perform Lower Body Dressing: with modified independence;sit to/from stand Pt Will Transfer to Toilet: with modified independence;ambulating Pt Will Perform Toileting - Clothing Manipulation and hygiene: with modified independence;sit to/from stand  OT Frequency: Min 2X/week   Barriers to D/C:    none known at this time          AM-PAC OT "6 Clicks" Daily Activity     Outcome Measure Help from another person eating meals?: None Help from another person taking care of personal grooming?: None Help from another person toileting, which includes using toliet, bedpan, or urinal?: A Little Help from another person bathing (including washing, rinsing, drying)?: A Little Help from another person to put on and taking off regular upper body clothing?: None Help from another person to put on and taking off regular lower body clothing?: A Little 6 Click Score: 21   End of Session Equipment Utilized During Treatment: Rolling walker (  2 wheels) Nurse Communication: Mobility status  Activity Tolerance: Patient limited by pain Patient left: in chair;with call bell/phone within reach;with chair alarm set;with family/visitor present  OT Visit Diagnosis: Unsteadiness on feet (R26.81);Muscle weakness (generalized) (M62.81)                Time: 3709-6438 OT Time Calculation (min): 23 min Charges:  OT General Charges $OT Visit: 1 Visit OT Evaluation $OT Eval Low Complexity: 1 Low OT Treatments $Self Care/Home  Management : 8-22 mins  Darleen Crocker, MS, OTR/L , CBIS ascom [phone]  11/15/21, 1:53 PM

## 2021-11-15 NOTE — Progress Notes (Signed)
Subjective:  POD #2 s/p left TKA.   Patient reports left pain as moderate.  Patient states her pain does respond to oxycodone.  Patient has had stable blood pressure since yesterday.  Patient was reported to have a short run of V. tach on her telemetry this morning.  Patient denies any chest pain, shortness of breath, abdominal pain or nausea and vomiting.  She denies any dizziness.  Dr. Arbutus Ped, the hospitalist was notified of the V. tach.  Dr. Rockey Situ from cardiology was curb sided and recommended that potassium and magnesium are repleted.  Patient received potassium and magnesium supplements today.  Patient has been cleared medically now for discharge.  Objective:   VITALS:   Vitals:   11/14/21 2129 11/15/21 0406 11/15/21 0748 11/15/21 1524  BP: 108/64 111/60 (!) 103/59 (!) 99/58  Pulse: 80 77 82 88  Resp: 17 20 19 16   Temp: 99 F (37.2 C) 98.9 F (37.2 C) 98.5 F (36.9 C) 99.7 F (37.6 C)  TempSrc: Oral     SpO2: 100% 99% 100% 98%  Weight:      Height:        PHYSICAL EXAM: Left lower extremity: I personally removed the patient's outer dressings today.  Aquacel dressing was completely clean and therefore was not changed.  Patient's knee has no erythema or significant effusion.  She has minimal ecchymosis.  Patient's compartments in the thigh and leg are soft and compressible.  She has palpable pedal pulses, intact station light touch and intact motor function distally.   LABS  Results for orders placed or performed during the hospital encounter of 11/13/21 (from the past 24 hour(s))  Glucose, capillary     Status: Abnormal   Collection Time: 11/14/21  7:16 PM  Result Value Ref Range   Glucose-Capillary 106 (H) 70 - 99 mg/dL  CBC     Status: Abnormal   Collection Time: 11/15/21  4:52 AM  Result Value Ref Range   WBC 8.8 4.0 - 10.5 K/uL   RBC 3.84 (L) 3.87 - 5.11 MIL/uL   Hemoglobin 11.4 (L) 12.0 - 15.0 g/dL   HCT 34.8 (L) 36.0 - 46.0 %   MCV 90.6 80.0 - 100.0 fL   MCH  29.7 26.0 - 34.0 pg   MCHC 32.8 30.0 - 36.0 g/dL   RDW 12.8 11.5 - 15.5 %   Platelets 125 (L) 150 - 400 K/uL   nRBC 0.0 0.0 - 0.2 %  Magnesium     Status: None   Collection Time: 11/15/21  4:52 AM  Result Value Ref Range   Magnesium 1.9 1.7 - 2.4 mg/dL    CT Angio Chest Pulmonary Embolism (PE) W or WO Contrast  Result Date: 11/14/2021 CLINICAL DATA:  Right-sided chest pain and shortness of breath, initial encounter EXAM: CT ANGIOGRAPHY CHEST WITH CONTRAST TECHNIQUE: Multidetector CT imaging of the chest was performed using the standard protocol during bolus administration of intravenous contrast. Multiplanar CT image reconstructions and MIPs were obtained to evaluate the vascular anatomy. CONTRAST:  16mL OMNIPAQUE IOHEXOL 350 MG/ML SOLN COMPARISON:  04/03/2021 chest x-ray FINDINGS: Cardiovascular: Atherosclerotic calcifications of the thoracic aorta are noted. No aneurysmal dilatation or dissection is noted. No cardiac enlargement is seen. Pulmonary artery demonstrates a normal branching pattern bilaterally. No intraluminal filling defect to suggest pulmonary embolism is seen. No coronary calcifications are noted. Mediastinum/Nodes: Thoracic inlet is within normal limits. No sizable hilar or mediastinal adenopathy is noted. The esophagus as visualized is within normal limits. Lungs/Pleura: The lungs  are well aerated bilaterally with mild dependent atelectatic changes. No focal confluent infiltrate is seen. No sizable effusion or pneumothorax is noted. No parenchymal nodules are seen. Upper Abdomen: Visualized upper abdomen appears within normal limits. Musculoskeletal: Degenerative changes of the thoracic spine are noted. No definitive rib abnormality is seen. Review of the MIP images confirms the above findings. IMPRESSION: No evidence of pulmonary emboli. Mild dependent atelectatic changes. Electronically Signed   By: Inez Catalina M.D.   On: 11/14/2021 00:26   ECHOCARDIOGRAM COMPLETE  Result  Date: 11/15/2021    ECHOCARDIOGRAM REPORT   Patient Name:   Victoria Benson [facility] Date of Exam: 11/14/2021 Medical Rec #:  [id number]    Height:       62.0 in Accession #:    [id number]   Weight:       177.0 lb Date of Birth:  09/12/1956    BSA:          1.815 m Patient Age:    66 years     BP:           108/64 mmHg Patient Gender: F            HR:           73 bpm. Exam Location:  [facility] Procedure: 2D Echo, Cardiac Doppler and Color Doppler Indications:     R07.9 Chest pain  History:         Patient has no prior history of Echocardiogram examinations.                  Signs/Symptoms:Murmur. Remote history of breast cancer.  Sonographer:     Cresenciano Lick RDCS Referring Phys:  [place] Diagnosing Phys: Ida Rogue MD IMPRESSIONS  1. Left ventricular ejection fraction, by estimation, is 60 to 65%. The left ventricle has normal function. The left ventricle has no regional wall motion abnormalities. Left ventricular diastolic parameters are consistent with Grade II diastolic dysfunction (pseudonormalization).  2. Right ventricular systolic function is normal. The right ventricular size is normal.  3. Left atrial size was mildly dilated.  4. The mitral valve is normal in structure. Mild mitral valve regurgitation. No evidence of mitral stenosis.  5. The aortic valve is normal in structure. Aortic valve regurgitation is not visualized. Aortic valve sclerosis is present, with no evidence of aortic valve stenosis.  6. The inferior vena cava is normal in size with greater than 50% respiratory variability, suggesting right atrial pressure of 3 mmHg. FINDINGS  Left Ventricle: Left ventricular ejection fraction, by estimation, is 60 to 65%. The left ventricle has normal function. The left ventricle has no regional wall motion abnormalities. The left ventricular internal cavity size was normal in size. There is  no left ventricular hypertrophy. Left ventricular diastolic parameters are consistent with Grade II  diastolic dysfunction (pseudonormalization). Right Ventricle: The right ventricular size is normal. No increase in right ventricular wall thickness. Right ventricular systolic function is normal. Left Atrium: Left atrial size was mildly dilated. Right Atrium: Right atrial size was normal in size. Pericardium: There is no evidence of pericardial effusion. Mitral Valve: The mitral valve is normal in structure. Mild mitral valve regurgitation. No evidence of mitral valve stenosis. Tricuspid Valve: The tricuspid valve is normal in structure. Tricuspid valve regurgitation is mild . No evidence of tricuspid stenosis. Aortic Valve: The aortic valve is normal in structure. Aortic valve regurgitation is not visualized. Aortic valve sclerosis is present, with no evidence of aortic valve stenosis. Pulmonic  Valve: The pulmonic valve was normal in structure. Pulmonic valve regurgitation is not visualized. No evidence of pulmonic stenosis. Aorta: The aortic root is normal in size and structure. Venous: The inferior vena cava is normal in size with greater than 50% respiratory variability, suggesting right atrial pressure of 3 mmHg. IAS/Shunts: No atrial level shunt detected by color flow Doppler.  LEFT VENTRICLE PLAX 2D LVIDd:         4.80 cm   Diastology LVIDs:         3.30 cm   LV e' medial:    9.46 cm/s LV PW:         0.80 cm   LV E/e' medial:  12.5 LV IVS:        0.80 cm   LV e' lateral:   11.00 cm/s LVOT diam:     2.20 cm   LV E/e' lateral: 10.7 LV SV:         107 LV SV Index:   59 LVOT Area:     3.80 cm  RIGHT VENTRICLE             IVC RV Basal diam:  2.70 cm     IVC diam: 1.20 cm RV S prime:     13.30 cm/s TAPSE (M-mode): 2.2 cm LEFT ATRIUM             Index        RIGHT ATRIUM           Index LA diam:        4.00 cm 2.20 cm/m   RA Area:     11.90 cm LA Vol (A2C):   31.1 ml 17.14 ml/m  RA Volume:   28.20 ml  15.54 ml/m LA Vol (A4C):   32.6 ml 17.96 ml/m LA Biplane Vol: 31.9 ml 17.58 ml/m  AORTIC VALVE LVOT Vmax:    144.00 cm/s LVOT Vmean:  96.800 cm/s LVOT VTI:    0.281 m  AORTA Ao Root diam: 2.80 cm Ao Asc diam:  3.10 cm MITRAL VALVE MV Area (PHT): 3.72 cm     SHUNTS MV Decel Time: 204 msec     Systemic VTI:  0.28 m MV E velocity: 118.00 cm/s  Systemic Diam: 2.20 cm MV A velocity: 99.20 cm/s MV E/A ratio:  1.19 Ida Rogue MD Electronically signed by Ida Rogue MD Signature Date/Time: 11/15/2021/7:55:02 AM    Final     Assessment/Plan: 2 Days Post-Op   Principal Problem:   Hypotension Active Problems:   Subclinical hypothyroidism   Other chest pain   S/P total knee arthroplasty, left   NSVT (nonsustained ventricular tachycardia)  Patient currently stable after postoperative hypotension with syncope on the day of surgery.  Patient was transferred to the ICU stepdown for blood pressure monitoring.  She did not require pressors.  Patient is remained stable and was transferred to the floor.  She has done well with physical therapy.  Given the patient's clinical improvement she was prepared for discharge home.  She is medically for discharge by the hospitalist service.  Patient will follow up with her primary care physician in approximately 1 week for follow-up after her hypotension and syncope.    [place] , MD 11/15/2021, 3:48 PM

## 2021-11-15 NOTE — TOC Progression Note (Signed)
Transition of Care [facility]) - Progression Note    Patient Details  Name: Victoria Benson MRN: [id number] Date of Birth: February 18, 1956  Transition of Care [facility] CM/SW [place], RN Phone Number: 11/15/2021, 10:39 AM  Clinical Narrative:   Met with the patient and her husband in the room, She is working with PT She is [facility], She needs aq 3 in 1, I notified Rhonda with [employer], it will be delivered to her room prior to DC She has transportation, She can afford her medication She lives at home with her husband and mother    Expected Discharge Plan: [place] Barriers to Discharge: Continued Medical Work up  Expected Discharge Plan and Services Expected Discharge Plan: [place] Choice: [place] arrangements for the past 2 months: Single Family Home                                       Social Determinants of Health (SDOH) Interventions    Readmission Risk Interventions No flowsheet data found.

## 2021-11-15 NOTE — Progress Notes (Signed)
PROGRESS NOTE    Victoria Benson   YJE:[id number]  DOB: [birth year]  PCP: Virginia Crews, MD    DOA: 11/13/2021 LOS: 1    Brief Narrative / Hospital Course to Date:   [facility] consulted by Orthopedic surgery for management of hypotension.  65 year old female admitted for an elective left total knee arthroplasty.  In the post-op setting, patient had significant hypotension and was admitted to stepdown for close monitoring and further management.  BP improved, responded well to IV fluids without need for vasopressors.  11/17: Patient has stable blood pressure since being off IV fluids early yesterday morning.   Blood pressure is improved patient is stable for discharge from medical standpoint.  Assessment & Plan   Principal Problem:   Hypotension Active Problems:   Subclinical hypothyroidism   Other chest pain   S/P total knee arthroplasty, left   Hypotension -resolved.  Baseline blood pressures are on the soft side.   Maintaining MAP adequately off of IV fluids since morning of 11/16. Patient reports BP on the low side since March of this year, advised by PCP to stay well-hydrated and consume fluid with electrolytes.  BP improved with this.  Suspect postop hypotension secondary to anesthesia and pain medications, possibly hypovolemia while n.p.o. Patient reports baseline systolic BPs since March in the 100-110's. CTA chest negative for PE, showed mild atelectasis. Echocardiogram showed preserved EF of 60 to 63%, grade 2 diastolic dysfunction with mild MR and TR. Patient was initially orthostatic with physical therapy early on postop day 1, but this was improved today.   Nonsustained V. Tach -patient was noted to have a 15 beat run of V. tach on telemetry this morning, 11/17.  Patient was reportedly asymptomatic, per RN.  Discussed with on-call cardiologist.  Recommended checking electrolytes and replace potassium and magnesium if necessary, placement of Zio patch ambulatory heart  monitor x2 weeks and outpatient follow-up in [facility] cardiology clinic.   --Potassium 3.7 on labs this morning, 40 mEq given --Magnesium level 1.9, 1 g IV mag sulfate given prior to discharge --Recommend repeat BMP and magnesium level within 1 week as outpatient --Goal K>4 and Mg>2   Left knee osteoarthritis - POD #1 from TKA with Dr. Seth Bake.   Management per orthopedic surgery, see their recommendations --PT recommending home health --Pain control as needed -patient and husband advised that pain medications can lower blood pressure and to keep an eye on this if she is requiring.  They express agreement and understanding. --Recommend bowel regimen with use of pain meds   Hx of Atypical chest pain - pt reported noticing onset of right-sided chest pain around March when BP issues started.  CTA chest ruled out PE as above. Suspected MSK etiology. --Monitor   Subclinical hypothyroidism - TSH and free T4 normal. --PCP follow up   Obesity: Body mass index is 32.37 kg/m.  Complicates overall care and prognosis.  Recommend lifestyle modifications including physical activity and diet for weight loss and overall long-term health.   DVT prophylaxis: enoxaparin (LOVENOX) injection 40 mg Start: 11/14/21 0800 SCDs Start: 11/13/21 1100   Diet:  Diet Orders (From admission, onward)     Start     Ordered   11/13/21 1300  Diet regular Room service appropriate? Yes; Fluid consistency: Thin  Diet effective now       Question Answer Comment  Room service appropriate? Yes   Fluid consistency: Thin      11/13/21 1259  Code Status: Full Code   Subjective 11/15/21    Pt seen awake sitting up in bed with husband at bedside this morning.  She reports overall feeling well.  Denies significant knee pain postoperatively.  She says her knee just feels very stiff.  She denies any chest pain, shortness of breath, palpitations, dizziness or lightheadedness when upright.  No other acute  complaints.   Disposition Plan & Communication   Status is: Inpatient  Remains inpatient appropriate because: BP remains soft this AM, monitor off IV fluids, PT evaluation pending.   Family Communication: husband at bedside on rounds today 11/17.   Consults, Procedures, Significant Events   Consultants:  [place] / Hospitalist service  Procedures:  Left total knee arthroplasty 11/15  Antimicrobials:  Anti-infectives (From admission, onward)    Start     Dose/Rate Route Frequency Ordered Stop   11/13/21 1400  ceFAZolin (ANCEF) IVPB 2g/100 mL premix        2 g 200 mL/hr over 30 Minutes Intravenous Every 6 hours 11/13/21 1259 11/13/21 2008   11/13/21 0614  clindamycin (CLEOCIN) 600 MG/50ML IVPB       Note to Pharmacy: Maryagnes Amos   : cabinet override      11/13/21 0614 11/13/21 0954   11/13/21 0614  ceFAZolin (ANCEF) 2-4 GM/100ML-% IVPB       Note to Pharmacy: Maryagnes Amos   : cabinet override      11/13/21 0614 11/13/21 0829   11/13/21 0600  ceFAZolin (ANCEF) IVPB 2g/100 mL premix        2 g 200 mL/hr over 30 Minutes Intravenous On call to O.R. 11/13/21 0350 11/13/21 0859   11/13/21 0400  clindamycin (CLEOCIN) IVPB 600 mg        600 mg 100 mL/hr over 30 Minutes Intravenous  Once 11/13/21 0350 11/13/21 0820         Micro    Objective   Vitals:   11/14/21 2025 11/14/21 2129 11/15/21 0406 11/15/21 0748  BP:  108/64 111/60 (!) 103/59  Pulse: 76 80 77 82  Resp: 20 17 20 19   Temp:  99 F (37.2 C) 98.9 F (37.2 C) 98.5 F (36.9 C)  TempSrc:  Oral    SpO2: 95% 100% 99% 100%  Weight:      Height:        Intake/Output Summary (Last 24 hours) at 11/15/2021 1418 Last data filed at 11/15/2021 1417 Gross per 24 hour  Intake 750 ml  Output 2500 ml  Net -1750 ml   Filed Weights   11/13/21 0623  Weight: 80.3 kg    Physical Exam:  General exam: awake, alert, no acute distress HEENT: moist mucus membranes, hearing grossly normal  Respiratory system:  CTAB with diminished bases, no wheezes, rales or rhonchi, normal respiratory effort.  On room air. Cardiovascular system: normal S1/S2, RRR, no JVD, murmurs, rubs, gallops, no pedal edema.   Central nervous system: A&O x3. no gross focal neurologic deficits, normal speech Skin: dry, intact, normal temperature, normal color Psychiatry: normal mood, congruent affect, judgement and insight appear normal  Labs   Data Reviewed: I have personally reviewed following labs and imaging studies  CBC: Recent Labs  Lab 11/13/21 2308 11/14/21 0039 11/15/21 0452  WBC 9.1 8.8 8.8  HGB 11.5*  11.4* 12.0 11.4*  HCT 35.5*  35.0* 36.4 34.8*  MCV 93.2 93.1 90.6  PLT 126* 129* 161*   Basic Metabolic Panel: Recent Labs  Lab 11/13/21 2308 11/14/21 0039 11/15/21 0452  NA 138 137  --   K 3.9 3.7  --   CL 108 107  --   CO2 23 25  --   GLUCOSE 136* 120*  --   BUN 7* 7*  --   CREATININE 0.47 0.55  --   CALCIUM 8.4* 8.4*  --   MG  --   --  1.9   GFR: Estimated Creatinine Clearance: 69.8 mL/min (by C-G formula based on SCr of 0.55 mg/dL). Liver Function Tests: Recent Labs  Lab 11/13/21 2308  AST 20  ALT 19  ALKPHOS 59  BILITOT 0.8  PROT 5.2*  ALBUMIN 3.0*   No results for input(s): LIPASE, AMYLASE in the last 168 hours. No results for input(s): AMMONIA in the last 168 hours. Coagulation Profile: No results for input(s): INR, PROTIME in the last 168 hours. Cardiac Enzymes: No results for input(s): CKTOTAL, CKMB, CKMBINDEX, TROPONINI in the last 168 hours. BNP (last 3 results) No results for input(s): PROBNP in the last 8760 hours. HbA1C: No results for input(s): HGBA1C in the last 72 hours. CBG: Recent Labs  Lab 11/13/21 1520 11/14/21 0027 11/14/21 1916  GLUCAP 116* 117* 106*   Lipid Profile: No results for input(s): CHOL, HDL, LDLCALC, TRIG, CHOLHDL, LDLDIRECT in the last 72 hours. Thyroid Function Tests: Recent Labs    11/13/21 2308  TSH 2.184  FREET4 0.76   Anemia  Panel: Recent Labs    11/13/21 2308  VITAMINB12 253   Sepsis Labs: No results for input(s): PROCALCITON, LATICACIDVEN in the last 168 hours.  Recent Results (from the past 240 hour(s))  SARS CORONAVIRUS 2 (TAT 6-24 HRS) Nasopharyngeal Nasopharyngeal Swab     Status: None   Collection Time: 11/09/21  8:15 AM   Specimen: Nasopharyngeal Swab  Result Value Ref Range Status   SARS Coronavirus 2 NEGATIVE NEGATIVE Final    Comment: (NOTE) SARS-CoV-2 target nucleic acids are NOT DETECTED.  The SARS-CoV-2 RNA is generally detectable in upper and lower respiratory specimens during the acute phase of infection. Negative results do not preclude SARS-CoV-2 infection, do not rule out co-infections with other pathogens, and should not be used as the sole basis for treatment or other patient management decisions. Negative results must be combined with clinical observations, patient history, and epidemiological information. The expected result is Negative.  Fact Sheet for Patients: SugarRoll.be  Fact Sheet for Healthcare Providers: https://www.woods-mathews.com/  This test is not yet approved or cleared by the [place] FDA and  has been authorized for detection and/or diagnosis of SARS-CoV-2 by FDA under an Emergency Use Authorization (EUA). This EUA will remain  in effect (meaning this test can be used) for the duration of the COVID-19 declaration under Se ction 564(b)(1) of the Act, 21 U.S.C. section 360bbb-3(b)(1), unless the authorization is terminated or revoked sooner.  Performed at [facility], [place] [address] [place], [place] [address]       Imaging Studies   CT Angio Chest Pulmonary Embolism (PE) W or WO Contrast  Result Date: 11/14/2021 CLINICAL DATA:  Right-sided chest pain and shortness of breath, initial encounter EXAM: CT ANGIOGRAPHY CHEST WITH CONTRAST TECHNIQUE: Multidetector CT imaging of the chest was performed  using the standard protocol during bolus administration of intravenous contrast. Multiplanar CT image reconstructions and MIPs were obtained to evaluate the vascular anatomy. CONTRAST:  41mL OMNIPAQUE IOHEXOL 350 MG/ML SOLN COMPARISON:  04/03/2021 chest x-ray FINDINGS: Cardiovascular: Atherosclerotic calcifications of the thoracic aorta are noted. No aneurysmal dilatation or dissection is noted.  No cardiac enlargement is seen. Pulmonary artery demonstrates a normal branching pattern bilaterally. No intraluminal filling defect to suggest pulmonary embolism is seen. No coronary calcifications are noted. Mediastinum/Nodes: Thoracic inlet is within normal limits. No sizable hilar or mediastinal adenopathy is noted. The esophagus as visualized is within normal limits. Lungs/Pleura: The lungs are well aerated bilaterally with mild dependent atelectatic changes. No focal confluent infiltrate is seen. No sizable effusion or pneumothorax is noted. No parenchymal nodules are seen. Upper Abdomen: Visualized upper abdomen appears within normal limits. Musculoskeletal: Degenerative changes of the thoracic spine are noted. No definitive rib abnormality is seen. Review of the MIP images confirms the above findings. IMPRESSION: No evidence of pulmonary emboli. Mild dependent atelectatic changes. Electronically Signed   By: Inez Catalina M.D.   On: 11/14/2021 00:26   ECHOCARDIOGRAM COMPLETE  Result Date: 11/15/2021    ECHOCARDIOGRAM REPORT   Patient Name:   LILINOE ACKLIN [facility] Date of Exam: 11/14/2021 Medical Rec #:  [id number]    Height:       62.0 in Accession #:    [id number]   Weight:       177.0 lb Date of Birth:  04/01/1956    BSA:          1.815 m Patient Age:    74 years     BP:           108/64 mmHg Patient Gender: F            HR:           73 bpm. Exam Location:  [facility] Procedure: 2D Echo, Cardiac Doppler and Color Doppler Indications:     R07.9 Chest pain  History:         Patient has no prior history of Echocardiogram  examinations.                  Signs/Symptoms:Murmur. Remote history of breast cancer.  Sonographer:     Cresenciano Lick RDCS Referring Phys:  [place] Diagnosing Phys: Ida Rogue MD IMPRESSIONS  1. Left ventricular ejection fraction, by estimation, is 60 to 65%. The left ventricle has normal function. The left ventricle has no regional wall motion abnormalities. Left ventricular diastolic parameters are consistent with Grade II diastolic dysfunction (pseudonormalization).  2. Right ventricular systolic function is normal. The right ventricular size is normal.  3. Left atrial size was mildly dilated.  4. The mitral valve is normal in structure. Mild mitral valve regurgitation. No evidence of mitral stenosis.  5. The aortic valve is normal in structure. Aortic valve regurgitation is not visualized. Aortic valve sclerosis is present, with no evidence of aortic valve stenosis.  6. The inferior vena cava is normal in size with greater than 50% respiratory variability, suggesting right atrial pressure of 3 mmHg. FINDINGS  Left Ventricle: Left ventricular ejection fraction, by estimation, is 60 to 65%. The left ventricle has normal function. The left ventricle has no regional wall motion abnormalities. The left ventricular internal cavity size was normal in size. There is  no left ventricular hypertrophy. Left ventricular diastolic parameters are consistent with Grade II diastolic dysfunction (pseudonormalization). Right Ventricle: The right ventricular size is normal. No increase in right ventricular wall thickness. Right ventricular systolic function is normal. Left Atrium: Left atrial size was mildly dilated. Right Atrium: Right atrial size was normal in size. Pericardium: There is no evidence of pericardial effusion. Mitral Valve: The mitral valve is normal in structure. Mild mitral valve regurgitation. No  evidence of mitral valve stenosis. Tricuspid Valve: The tricuspid valve is normal in  structure. Tricuspid valve regurgitation is mild . No evidence of tricuspid stenosis. Aortic Valve: The aortic valve is normal in structure. Aortic valve regurgitation is not visualized. Aortic valve sclerosis is present, with no evidence of aortic valve stenosis. Pulmonic Valve: The pulmonic valve was normal in structure. Pulmonic valve regurgitation is not visualized. No evidence of pulmonic stenosis. Aorta: The aortic root is normal in size and structure. Venous: The inferior vena cava is normal in size with greater than 50% respiratory variability, suggesting right atrial pressure of 3 mmHg. IAS/Shunts: No atrial level shunt detected by color flow Doppler.  LEFT VENTRICLE PLAX 2D LVIDd:         4.80 cm   Diastology LVIDs:         3.30 cm   LV e' medial:    9.46 cm/s LV PW:         0.80 cm   LV E/e' medial:  12.5 LV IVS:        0.80 cm   LV e' lateral:   11.00 cm/s LVOT diam:     2.20 cm   LV E/e' lateral: 10.7 LV SV:         107 LV SV Index:   59 LVOT Area:     3.80 cm  RIGHT VENTRICLE             IVC RV Basal diam:  2.70 cm     IVC diam: 1.20 cm RV S prime:     13.30 cm/s TAPSE (M-mode): 2.2 cm LEFT ATRIUM             Index        RIGHT ATRIUM           Index LA diam:        4.00 cm 2.20 cm/m   RA Area:     11.90 cm LA Vol (A2C):   31.1 ml 17.14 ml/m  RA Volume:   28.20 ml  15.54 ml/m LA Vol (A4C):   32.6 ml 17.96 ml/m LA Biplane Vol: 31.9 ml 17.58 ml/m  AORTIC VALVE LVOT Vmax:   144.00 cm/s LVOT Vmean:  96.800 cm/s LVOT VTI:    0.281 m  AORTA Ao Root diam: 2.80 cm Ao Asc diam:  3.10 cm MITRAL VALVE MV Area (PHT): 3.72 cm     SHUNTS MV Decel Time: 204 msec     Systemic VTI:  0.28 m MV E velocity: 118.00 cm/s  Systemic Diam: 2.20 cm MV A velocity: 99.20 cm/s MV E/A ratio:  1.19 Ida Rogue MD Electronically signed by Ida Rogue MD Signature Date/Time: 11/15/2021/7:55:02 AM    Final      Medications   Scheduled Meds:  calcium-vitamin D  1 tablet Oral Daily   Chlorhexidine Gluconate Cloth  6  each Topical Daily   docusate sodium  100 mg Oral BID   enoxaparin (LOVENOX) injection  40 mg Subcutaneous Q24H   fluticasone  1 spray Each Nare Daily   loratadine  10 mg Oral Daily   multivitamin with minerals  1 tablet Oral Daily   pantoprazole  40 mg Oral BID   potassium chloride  40 mEq Oral Once   traMADol  50 mg Oral Q6H   Continuous Infusions:  methocarbamol (ROBAXIN) IV         LOS: 1 day    Time spent: 30 minutes    Ezekiel Slocumb, DO [facility]  11/15/2021, 2:18 PM      If 7PM-7AM, please contact night-coverage. How to contact the [facility] Attending or Consulting provider [place] or covering provider during after hours [place], for this patient?    Check the care team in [facility] and look for a) attending/consulting [facility] provider listed and b) the [facility] team listed Log into [email or web address] and use [facility]'s universal password to access. If you do not have the password, please contact the hospital operator. Locate the [facility] provider you are looking for under [facility] and page to a number that you can be directly reached. If you still have difficulty reaching the provider, please page the [facility] (Director on Call) for the Hospitalists listed on amion for assistance.

## 2021-11-15 NOTE — Progress Notes (Signed)
Patient and husband was given discharge instructions, patient acknowledge understanding. Patient state she will comply with instructions and medications and keep all appointments. Patient IV's  was removed and a dry dressing was applied.

## 2021-11-15 NOTE — Progress Notes (Signed)
Physical Therapy Treatment Patient Details Name: Victoria Benson MRN: [id number] DOB: 1956-02-09 Today's Date: 11/15/2021   History of Present Illness Pt is a 65 y.o. female who presents with history of breast cancer, lymphoma, and skin cancer with a diagnosis of left knee osteoarthritis and is now s/p L TKA.    PT Comments    Pt was long sitting in bed with RN and support spouse in room. She agrees to PT session and is cooperative throughout. See RN for orthostatic vitals during session. Pt was not hypotension. She was able to exit L side of bed with min assist. Stood to RW with CGA and ambulated with RW 120 ft without LOB or unsteadiness. Supportive spouse was present throughout and will be available to assist pt at DC. Acute PT recommends DC home with HHPT to follow. Author will return this afternoon for stair training.    Recommendations for follow up therapy are one component of a multi-disciplinary discharge planning process, led by the attending physician.  Recommendations may be updated based on patient status, additional functional criteria and insurance authorization.  Follow Up Recommendations  Home health PT     Assistance Recommended at Discharge Frequent or constant Supervision/Assistance  Equipment Recommendations  None recommended by PT       Precautions / Restrictions Precautions Precautions: Knee Precaution Booklet Issued: Yes (comment) Required Braces or Orthoses: Knee Immobilizer - Left Restrictions Weight Bearing Restrictions: Yes LLE Weight Bearing: Weight bearing as tolerated     Mobility  Bed Mobility Overal bed mobility: Needs Assistance Bed Mobility: Supine to Sit;Sit to Supine      Min assist to exit L side of bed        Transfers Overall transfer level: Needs assistance Equipment used: Rolling walker (2 wheels) Transfers: Sit to/from Stand Sit to Stand: Supervision     Ambulation/Gait Ambulation/Gait assistance: Supervision Gait Distance  (Feet): 120 Feet Assistive device: Rolling walker (2 wheels) Gait Pattern/deviations: Step-to pattern;Antalgic;Decreased step length - right;Decreased stride length Gait velocity: decreased     General Gait Details: Pt was easily and safely able to ambulate 120 ft with RW. no LOB or safety concern.    Balance Overall balance assessment: Needs assistance Sitting-balance support: Bilateral upper extremity supported;Feet supported Sitting balance-Leahy Scale: Good     Standing balance support: Bilateral upper extremity supported;During functional activity Standing balance-Leahy Scale: Good      Cognition Arousal/Alertness: Awake/alert Behavior During Therapy: WFL for tasks assessed/performed Overall Cognitive Status: Within Functional Limits for tasks assessed      General Comments: Pt is A and O x 4        Exercises Total Joint Exercises Goniometric ROM: 3-87        Pertinent Vitals/Pain Pain Assessment: 0-10 Pain Score: 4  Pain Intervention(s): RN gave pain meds during session;Monitored during session;Limited activity within patient's tolerance;Repositioned;Ice applied     PT Goals (current goals can now be found in the care plan section) Acute Rehab PT Goals Patient Stated Goal: go home when ready Progress towards PT goals: Progressing toward goals    Frequency    BID      PT Plan Current plan remains appropriate       AM-PAC PT "6 Clicks" Mobility   Outcome Measure  Help needed turning from your back to your side while in a flat bed without using bedrails?: A Little Help needed moving from lying on your back to sitting on the side of a flat bed without using bedrails?: A  Little Help needed moving to and from a bed to a chair (including a wheelchair)?: A Little Help needed standing up from a chair using your arms (e.g., wheelchair or bedside chair)?: A Little Help needed to walk in hospital room?: A Little Help needed climbing 3-5 steps with a railing?  : A Little 6 Click Score: 18    End of Session Equipment Utilized During Treatment: Gait belt Activity Tolerance: Patient tolerated treatment well;Patient limited by pain Patient left: in chair;with call bell/phone within reach;with chair alarm set;with family/visitor present Nurse Communication: Mobility status PT Visit Diagnosis: Other abnormalities of gait and mobility (R26.89);Pain;Muscle weakness (generalized) (M62.81) Pain - Right/Left: Left Pain - part of body: Knee     Time: 3545-6256 PT Time Calculation (min) (ACUTE ONLY): 40 min  Charges:  $Gait Training: 8-22 mins $Therapeutic Exercise: 8-22 mins $Therapeutic Activity: 8-22 mins                    Julaine Fusi PTA 11/15/21, 1:06 PM

## 2021-11-16 ENCOUNTER — Ambulatory Visit (INDEPENDENT_AMBULATORY_CARE_PROVIDER_SITE_OTHER): Payer: Managed Care, Other (non HMO)

## 2021-11-16 DIAGNOSIS — I4729 Other ventricular tachycardia: Secondary | ICD-10-CM

## 2021-11-16 NOTE — Telephone Encounter (Addendum)
RT from hospital contacted our office to notify that zio monitor was not placed and will need to be mailed to pt.  Per discharge summary - will order zio monitor 2 weeks for NSVT.  Called and spoke to pt. Discussed zio monitor instructions and that I will have mailed to her home.  Confirmed home address with pt. Zio XT ordered for home enrollment.  Pt has been scheduled with Dr. Rockey Situ 12/17/21 at 1120.  Pt has no further questions at this time.

## 2021-11-16 NOTE — Telephone Encounter (Signed)
Monitor was not placed in hospital  Will need a ZIO mailed to patient before appt

## 2021-11-20 DIAGNOSIS — I4729 Other ventricular tachycardia: Secondary | ICD-10-CM

## 2021-12-16 NOTE — Progress Notes (Signed)
Cardiology Office Note  Date:  12/17/2021   ID:  Sharma, Lawrance 01-15-1956, MRN [id number]  PCP:  Virginia Crews, MD   Chief Complaint  Patient presents with   Follow up Zio monitor    "Doing well." Medications reviewed by the patient verbally.     HPI:  Ms Victoria Benson is a 50 woman with past medical history of Non hodgkins lymphoma symptomatic varicose vein nonsmoker Who presents for new patient evaluation of her nonsustained VT seen on hospital telemetry  Recent hospitalization for left knee osteoarthritis, underwent an uncomplicated left total knee arthroplasty.   Date of procedure November 13, 2021  In recovery on telemetry 15 beat run of V. tach 11/17.    reportedly asymptomatic, per RN.    CTA chest negative for PE, showed mild atelectasis. No aortic athero, no significant coronary calcification  Echocardiogram showed preserved EF of 60 to 76%, grade 2 diastolic dysfunction with mild MR and TR.  Presenting for discussion of zio monitor Ordered for NSVT  EKG personally reviewed by myself on todays visit Normal sinus rhythm rate 80 bpm no significant ST-T wave changes  Zio monitor reviewed on today's visit Patient had a min HR of 53 bpm, max HR of 167 bpm, and avg HR of 81 bpm. Predominant underlying rhythm was Sinus Rhythm. Slight P wave morphology changes were noted. 9 Supraventricular Tachycardia runs occurred, the run with the fastest interval lasting  10 beats with a max rate of 167 bpm, the longest lasting 11 beats with an avg rate of 117 bpm. Isolated SVEs were rare (<1.0%), SVE Couplets were rare (<1.0%), and SVE Triplets were rare (<1.0%). Isolated VEs were rare (<1.0%), VE Couplets were rare  (<1.0%), and no VE Triplets were present.   PMH:   has a past medical history of BRCA gene mutation negative (07/10/2016), Breast cancer ([place]) (2009), Breast cancer ([place]) (06/17/2016), Diffuse large B-cell lymphoma ([place]) (11/2017), Family history of adverse  reaction to anesthesia, Heart murmur, History of basal cell carcinoma (02/11/2011), History of dysplastic nevus (06/20/2011), Osteoarthritis, Personal history of chemotherapy (2019), Personal history of radiation therapy (2009), Personal history of radiation therapy (2017), PONV (postoperative nausea and vomiting), Radiation (2009), and Skin cancer.  PSH:    Past Surgical History:  Procedure Laterality Date   APPENDECTOMY     BREAST BIOPSY Right 06/17/2016   invasive. T1, ER positive, PR positive, HER-2/neu not overexpressed   BREAST CYST ASPIRATION Left 2002   BREAST EXCISIONAL BIOPSY Right 06/2016   lumpectomy with rad    BREAST EXCISIONAL BIOPSY Right 2009   breast ca + mammo site radiation   BREAST LUMPECTOMY Right 2017   invasive mammary NEG margins   BREAST LUMPECTOMY Right 2009   mammosite   BREAST LUMPECTOMY WITH NEEDLE LOCALIZATION Right 07/18/2016   Procedure: BREAST LUMPECTOMY WITH NEEDLE LOCALIZATION AND RIGHT AXILLARY EXPLORATION;  Surgeon: Robert Bellow, MD;  Location: [facility] ORS;  Service: General;  Laterality: Right;   BREAST SURGERY Right July 2009   Wide excision, sentinel left node biopsy MammoSite. ( 06/21/2008   CHOLECYSTECTOMY  1984   COLONOSCOPY WITH PROPOFOL N/A 10/13/2018   Procedure: COLONOSCOPY WITH PROPOFOL;  Surgeon: Lucilla Lame, MD;  Location: [facility] ENDOSCOPY;  Service: Endoscopy;  Laterality: N/A;   DILATION AND CURETTAGE OF UTERUS  2004   ENDOMETRIAL ABLATION  2005   IR FLUORO GUIDE PORT INSERTION RIGHT  01/15/2018   TOTAL KNEE ARTHROPLASTY Left 11/13/2021   Procedure: LEFT TOTAL KNEE ARTHROPLASTY;  Surgeon:  Thornton Park, MD;  Location: [facility] ORS;  Service: Orthopedics;  Laterality: Left;   TUMOR REMOVAL  12/2013   from uterus done by [facility]    Current Outpatient Medications  Medication Sig Dispense Refill   acetaminophen (TYLENOL) 325 MG tablet Take 650 mg by mouth every 6 (six) hours as needed.     aspirin EC 325 MG tablet Take 1 tablet  (325 mg total) by mouth 2 (two) times daily. 90 tablet 0   Calcium Carb-Cholecalciferol (CALCIUM 600/VITAMIN D PO) Take 1 tablet by mouth daily.     cetirizine (ZYRTEC) 10 MG tablet Take 10 mg daily by mouth.     fluticasone (FLONASE) 50 MCG/ACT nasal spray USE 2 SPRAYS INTO BOTH NOSTRILS DAILY (Patient taking differently: Place 1 spray into both nostrils daily.) 16 g 5   Multiple Vitamin (MULTIVITAMIN) capsule Take 1 capsule by mouth daily.     Omega-3 Krill Oil 500 MG CAPS Take 500 mg by mouth daily. (Patient not taking: Reported on 12/17/2021)     oxyCODONE (OXY IR/ROXICODONE) 5 MG immediate release tablet Take 1-2 tablets (5-10 mg total) by mouth every 4 (four) hours as needed. (Patient not taking: Reported on 12/17/2021) 40 tablet 0   No current facility-administered medications for this visit.    Allergies:   Patient has no known allergies.   Social History:  The patient  reports that she has never smoked. She has never used smokeless tobacco. She reports that she does not drink alcohol and does not use drugs.   Family History:   family history includes CAD in her mother; Diabetes in her father and paternal grandfather; Fibromyalgia in her brother; Healthy in her brother; Heart attack in her paternal grandmother; Hypertension in her brother, brother, and brother; Lung cancer in her father; Osteoporosis in her mother; Ovarian cancer in her maternal grandmother and another family member; Sleep apnea in her brother and brother.    Review of Systems: Review of Systems  Constitutional: Negative.   HENT: Negative.    Respiratory: Negative.    Cardiovascular: Negative.   Gastrointestinal: Negative.   Musculoskeletal: Negative.   Neurological: Negative.   Psychiatric/Behavioral: Negative.    All other systems reviewed and are negative.   PHYSICAL EXAM: VS:  BP 112/60 (BP Location: Left Arm, Patient Position: Sitting, Cuff Size: Normal)    Pulse 80    Ht _0  (1.575 m)    Wt 167 lb  (75.8 kg)    SpO2 99%    BMI 30.54 kg/m  , BMI Body mass index is 30.54 kg/m. GEN: Well nourished, well developed, in no acute distress HEENT: normal Neck: no JVD, carotid bruits, or masses Cardiac: RRR; no murmurs, rubs, or gallops,no edema  Respiratory:  clear to auscultation bilaterally, normal work of breathing GI: soft, nontender, nondistended, + BS MS: no deformity or atrophy Skin: warm and dry, no rash Neuro:  Strength and sensation are intact Psych: euthymic mood, full affect   Recent Labs: 11/13/2021: ALT 19; B Natriuretic Peptide 109.0; TSH 2.184 11/14/2021: BUN 7; Creatinine, Ser 0.55; Potassium 3.7; Sodium 137 11/15/2021: Hemoglobin 11.4; Magnesium 1.9; Platelets 125    Lipid Panel Lab Results  Component Value Date   CHOL 215 (H) 09/29/2020   HDL 49 09/29/2020   LDLCALC 128 (H) 09/29/2020   TRIG 217 (H) 09/29/2020    Wt Readings from Last 3 Encounters:  12/17/21 167 lb (75.8 kg)  11/13/21 177 lb (80.3 kg)  10/31/21 178 lb 4.8 oz (80.9 kg)  ASSESSMENT AND PLAN:  Problem List Items Addressed This Visit       Cardiology Problems   NSVT (nonsustained ventricular tachycardia) - Primary   HLD (hyperlipidemia)   Chronic venous insufficiency   Nonsustained VT Noted by report in the hospital following total knee replacement surgery Zio monitor with no significant arrhythmia, results pulled up and discussed Normal echocardiogram CT scan images pulled up and reviewed, no significant coronary calcification or aortic atherosclerosis Normal EKG, no symptoms concerning for angina No further testing at this time Denies any symptoms concerning for arrhythmia Recommended she call us for any near-syncope or syncope episodes  Hyperlipidemia No plaque in the carotids or aorta on CT No notable coronary calcification noted She prefers no statin at this time  History of non-Hodgkin's lymphoma/breast cancer Prior radiation to neck, right chest Fortunately on the  right not the left  SVT Rare very short episodes noted on telemetry/ZIO monitor Asymptomatic, Blood pressure is too low to add beta-blocker   Total encounter time more than 60 minutes  Greater than 50% was spent in counseling and coordination of care with the patient    Signed, Esmond Plants, M.D., Ph.D. [place], [place]

## 2021-12-17 ENCOUNTER — Other Ambulatory Visit: Payer: Self-pay

## 2021-12-17 ENCOUNTER — Ambulatory Visit (INDEPENDENT_AMBULATORY_CARE_PROVIDER_SITE_OTHER): Payer: Managed Care, Other (non HMO) | Admitting: Cardiovascular Disease

## 2021-12-17 ENCOUNTER — Encounter: Payer: Self-pay | Admitting: Cardiovascular Disease

## 2021-12-17 VITALS — BP 112/60 | HR 80 | Ht 62.0 in | Wt 167.0 lb

## 2021-12-17 DIAGNOSIS — I4729 Other ventricular tachycardia: Secondary | ICD-10-CM | POA: Diagnosis not present

## 2021-12-17 DIAGNOSIS — E782 Mixed hyperlipidemia: Secondary | ICD-10-CM | POA: Diagnosis not present

## 2021-12-17 DIAGNOSIS — I471 Supraventricular tachycardia: Secondary | ICD-10-CM

## 2021-12-17 DIAGNOSIS — I872 Venous insufficiency (chronic) (peripheral): Secondary | ICD-10-CM | POA: Diagnosis not present

## 2021-12-17 NOTE — Patient Instructions (Addendum)
Medication Instructions:  No changes  If you need a refill on your cardiac medications before your next appointment, please call your pharmacy.   Lab work: No new labs needed  Testing/Procedures: No new testing needed  Follow-Up: At [facility], you and your health needs are our priority.  As part of our continuing mission to provide you with exceptional heart care, we have created designated Provider Care Teams.  These Care Teams include your primary Cardiologist (physician) and Advanced Practice Providers (APPs -  Physician Assistants and Nurse Practitioners) who all work together to provide you with the care you need, when you need it.  You will need a follow up appointment as needed  Providers on your designated Care Team:   Christopher Berge, NP Ryan Dunn, PA-C Cadence Furth, PA-C  COVID-19 Vaccine Information can be found at: [email or web address] For questions related to vaccine distribution or appointments, please email [email or web address] or call [phone].    

## 2021-12-19 NOTE — Addendum Note (Signed)
Addended by: Ronaldo Miyamoto on: 12/19/2021 01:36 PM   Modules accepted: Orders

## 2022-01-07 ENCOUNTER — Telehealth: Payer: Self-pay

## 2022-01-07 NOTE — Telephone Encounter (Signed)
Left detail message on VM of pt's recent results okay by DPR, Dr. Rockey Situ advised   "Zio monitor  Triggered events not recorded  No significant arrhythmia noted "  At this time, no further recommendations or medications changes, advised to call office for any concerns or questions, otherwise will see at next visit.   Results released to MyChart by Dr. Rockey Situ with results comments

## 2022-03-15 ENCOUNTER — Encounter
Admission: RE | Admit: 2022-03-15 | Discharge: 2022-03-15 | Disposition: A | Discharge status: Home / Self Care | Payer: Managed Care, Other (non HMO) | Source: Ambulatory Visit | Attending: Orthopedic Surgery | Admitting: Orthopedic Surgery

## 2022-03-15 ENCOUNTER — Other Ambulatory Visit: Payer: Self-pay

## 2022-03-15 NOTE — Patient Instructions (Addendum)
Your procedure is scheduled on: Tuesday 03/19/22 ?Report to the Registration Desk on the 1st floor of the [facility]. ?To find out your arrival time, please call [phone] between 1PM - 3PM on: Monday 03/18/22 ? ?REMEMBER: ?Instructions that are not followed completely may result in serious medical risk, up to and including death; or upon the discretion of your surgeon and anesthesiologist your surgery may need to be rescheduled. ? ?Do not eat or drink after midnight the night before surgery.  ?No gum chewing, lozengers or hard candies. ? ?TAKE THESE MEDICATIONS THE MORNING OF SURGERY WITH A SIP OF WATER: ?None ? ?One week prior to surgery: ?Stop Anti-inflammatories (NSAIDS) such as Advil, Aleve, Ibuprofen, Motrin, Naproxen, Naprosyn and Aspirin based products such as Excedrin, Goodys Powder, BC Powder. ? ?Stop taking your Calcium Carb-Cholecalciferol (CALCIUM 600/VITAMIN D PO), Multiple Vitamin (MULTIVITAMIN) capsule, OMEGA-3 KRILL OIL PO, TART CHERRY PO and ANY other OVER THE COUNTER supplements until after surgery. ? ?You may however, continue to take Tylenol if needed for pain up until the day of surgery. ? ?No Alcohol for 24 hours before or after surgery. ? ?No Smoking including e-cigarettes for 24 hours prior to surgery.  ?No chewable tobacco products for at least 6 hours prior to surgery.  ?No nicotine patches on the day of surgery. ? ?Do not use any "recreational" drugs for at least a week prior to your surgery.  ?Please be advised that the combination of cocaine and anesthesia may have negative outcomes, up to and including death. ?If you test positive for cocaine, your surgery will be cancelled. ? ?On the morning of surgery brush your teeth with toothpaste and water, you may rinse your mouth with mouthwash if you wish. ?Do not swallow any toothpaste or mouthwash. ? ?Do not wear jewelry, make-up, hairpins, clips or nail polish. ? ?Do not wear lotions, powders, or perfumes.  ? ?Do not shave body from  the neck down 48 hours prior to surgery just in case you cut yourself which could leave a site for infection.  ? ?Do not bring valuables to the hospital. [facility] is not responsible for any missing/lost belongings or valuables.  ? ?Notify your doctor if there is any change in your medical condition (cold, fever, infection). ? ?Wear comfortable clothing (specific to your surgery type) to the hospital. ? ?If you are being discharged the day of surgery, you will not be allowed to drive home. ?You will need a responsible adult (18 years or older) to drive you home and stay with you that night.  ? ?If you are taking public transportation, you will need to have a responsible adult (18 years or older) with you. ?Please confirm with your physician that it is acceptable to use public transportation.  ? ?Please call the [facility] at [phone] if you have any questions about these instructions. ? ?Surgery Visitation Policy: ? ?Patients undergoing a surgery or procedure may have one family member or support person with them as long as that person is not COVID-19 positive or experiencing its symptoms.  ?That person may remain in the waiting area during the procedure and may rotate out with other people. ? ?Inpatient Visitation:   ? ?Visiting hours are 7 a.m. to 8 p.m. ?Up to two visitors ages 16+ are allowed at one time in a patient room. The visitors may rotate out with other people during the day. Visitors must check out when they leave, or other visitors will not be allowed. One  designated support person may remain overnight. ?The visitor must pass COVID-19 screenings, use hand sanitizer when entering and exiting the patient?s room and wear a mask at all times, including in the patient?s room. ?Patients must also wear a mask when staff or their visitor are in the room. ?Masking is required regardless of vaccination status.  ?

## 2022-03-18 ENCOUNTER — Other Ambulatory Visit: Payer: Self-pay | Admitting: Orthopedic Surgery

## 2022-03-19 ENCOUNTER — Ambulatory Visit: Payer: Managed Care, Other (non HMO) | Admitting: Oncology

## 2022-03-19 ENCOUNTER — Other Ambulatory Visit: Payer: Managed Care, Other (non HMO)

## 2022-03-25 MED ORDER — CHLORHEXIDINE GLUCONATE 0.12 % MT SOLN: 15.0000 mL | Freq: Once | OROMUCOSAL | Status: AC

## 2022-03-25 MED ORDER — ACETAMINOPHEN 500 MG PO TABS: 1000.0000 mg | ORAL_TABLET | ORAL | Status: DC

## 2022-03-25 MED ORDER — APREPITANT 40 MG PO CAPS: 40.0000 mg | ORAL_CAPSULE | Freq: Once | ORAL | Status: AC

## 2022-03-25 MED ORDER — CHLORHEXIDINE GLUCONATE CLOTH 2 % EX PADS
6.0000 | MEDICATED_PAD | Freq: Once | CUTANEOUS | Status: AC
  Administered 2022-03-26: 6 via TOPICAL

## 2022-03-25 MED ORDER — ORAL CARE MOUTH RINSE: 15.0000 mL | Freq: Once | OROMUCOSAL | Status: AC

## 2022-03-25 MED ORDER — LACTATED RINGERS IV SOLN: INTRAVENOUS | Status: DC

## 2022-03-25 MED ORDER — FAMOTIDINE 20 MG PO TABS: 20.0000 mg | ORAL_TABLET | Freq: Once | ORAL | Status: AC

## 2022-03-26 ENCOUNTER — Ambulatory Visit: Payer: Managed Care, Other (non HMO) | Admitting: Anesthesiology

## 2022-03-26 ENCOUNTER — Encounter
Admission: RE | Disposition: A | Discharge status: Home / Self Care | Payer: Self-pay | Source: Home / Self Care | Attending: Orthopedic Surgery

## 2022-03-26 ENCOUNTER — Ambulatory Visit
Admission: RE | Admit: 2022-03-26 | Discharge: 2022-03-26 | Disposition: A | Discharge status: Home / Self Care | Payer: Managed Care, Other (non HMO) | Attending: Orthopedic Surgery | Admitting: Orthopedic Surgery

## 2022-03-26 ENCOUNTER — Other Ambulatory Visit: Payer: Self-pay

## 2022-03-26 ENCOUNTER — Encounter: Payer: Self-pay | Admitting: Orthopedic Surgery

## 2022-03-26 DIAGNOSIS — Z9221 Personal history of antineoplastic chemotherapy: Secondary | ICD-10-CM | POA: Diagnosis not present

## 2022-03-26 DIAGNOSIS — Z96652 Presence of left artificial knee joint: Secondary | ICD-10-CM | POA: Insufficient documentation

## 2022-03-26 DIAGNOSIS — Z853 Personal history of malignant neoplasm of breast: Secondary | ICD-10-CM | POA: Diagnosis not present

## 2022-03-26 DIAGNOSIS — I509 Heart failure, unspecified: Secondary | ICD-10-CM | POA: Insufficient documentation

## 2022-03-26 DIAGNOSIS — M24662 Ankylosis, left knee: Secondary | ICD-10-CM | POA: Insufficient documentation

## 2022-03-26 DIAGNOSIS — Z85828 Personal history of other malignant neoplasm of skin: Secondary | ICD-10-CM | POA: Diagnosis not present

## 2022-03-26 DIAGNOSIS — Z923 Personal history of irradiation: Secondary | ICD-10-CM | POA: Diagnosis not present

## 2022-03-26 DIAGNOSIS — Z86018 Personal history of other benign neoplasm: Secondary | ICD-10-CM | POA: Diagnosis not present

## 2022-03-26 DIAGNOSIS — C8330 Diffuse large B-cell lymphoma, unspecified site: Secondary | ICD-10-CM | POA: Diagnosis not present

## 2022-03-26 DIAGNOSIS — I110 Hypertensive heart disease with heart failure: Secondary | ICD-10-CM | POA: Insufficient documentation

## 2022-03-26 HISTORY — PX: KNEE ARTHROSCOPY: SHX127

## 2022-03-26 HISTORY — PX: KNEE CLOSED REDUCTION: SHX995

## 2022-03-26 SURGERY — MANIPULATION, KNEE, CLOSED
Anesthesia: General | Site: Knee | Laterality: Left

## 2022-03-26 MED ORDER — BUPIVACAINE-EPINEPHRINE (PF) 0.25% -1:200000 IJ SOLN
INTRAMUSCULAR | Status: AC
  Filled 2022-03-26: qty 30

## 2022-03-26 MED ORDER — LIDOCAINE HCL (PF) 2 % IJ SOLN
INTRAMUSCULAR | Status: AC
  Filled 2022-03-26: qty 5

## 2022-03-26 MED ORDER — PROPOFOL 10 MG/ML IV BOLUS
INTRAVENOUS | Status: DC | PRN
  Administered 2022-03-26: 100 mg via INTRAVENOUS

## 2022-03-26 MED ORDER — ACETAMINOPHEN 500 MG PO TABS
ORAL_TABLET | ORAL | Status: AC
  Filled 2022-03-26: qty 2

## 2022-03-26 MED ORDER — ONDANSETRON HCL 4 MG/2ML IJ SOLN: 4.0000 mg | Freq: Once | INTRAMUSCULAR | Status: DC | PRN

## 2022-03-26 MED ORDER — PROPOFOL 10 MG/ML IV BOLUS
INTRAVENOUS | Status: AC
  Filled 2022-03-26: qty 20

## 2022-03-26 MED ORDER — OXYCODONE HCL 5 MG/5ML PO SOLN: 5.0000 mg | Freq: Once | ORAL | Status: DC | PRN

## 2022-03-26 MED ORDER — FENTANYL CITRATE (PF) 100 MCG/2ML IJ SOLN
INTRAMUSCULAR | Status: AC
  Filled 2022-03-26: qty 2

## 2022-03-26 MED ORDER — SUCCINYLCHOLINE CHLORIDE 200 MG/10ML IV SOSY
PREFILLED_SYRINGE | INTRAVENOUS | Status: DC | PRN
Start: 2022-03-26 — End: 2022-03-26
  Administered 2022-03-26: 100 mg via INTRAVENOUS

## 2022-03-26 MED ORDER — MIDAZOLAM HCL 2 MG/2ML IJ SOLN
INTRAMUSCULAR | Status: DC | PRN
  Administered 2022-03-26: 1 mg via INTRAVENOUS

## 2022-03-26 MED ORDER — PHENYLEPHRINE HCL-NACL 20-0.9 MG/250ML-% IV SOLN
INTRAVENOUS | Status: AC
  Filled 2022-03-26: qty 250

## 2022-03-26 MED ORDER — ACETAMINOPHEN 10 MG/ML IV SOLN: 1000.0000 mg | Freq: Once | INTRAVENOUS | Status: DC | PRN

## 2022-03-26 MED ORDER — DEXAMETHASONE SODIUM PHOSPHATE 10 MG/ML IJ SOLN
INTRAMUSCULAR | Status: DC | PRN
  Administered 2022-03-26: 10 mg via INTRAVENOUS

## 2022-03-26 MED ORDER — CHLORHEXIDINE GLUCONATE 0.12 % MT SOLN
OROMUCOSAL | Status: AC
  Administered 2022-03-26: 15 mL via OROMUCOSAL
  Filled 2022-03-26: qty 15

## 2022-03-26 MED ORDER — OXYCODONE HCL 5 MG PO TABS: 5.0000 mg | ORAL_TABLET | Freq: Once | ORAL | Status: DC | PRN

## 2022-03-26 MED ORDER — PHENYLEPHRINE HCL (PRESSORS) 10 MG/ML IV SOLN
INTRAVENOUS | Status: DC | PRN
  Administered 2022-03-26: 100 ug via INTRAVENOUS

## 2022-03-26 MED ORDER — ONDANSETRON HCL 4 MG/2ML IJ SOLN
INTRAMUSCULAR | Status: DC | PRN
  Administered 2022-03-26: 4 mg via INTRAVENOUS

## 2022-03-26 MED ORDER — MIDAZOLAM HCL 2 MG/2ML IJ SOLN
INTRAMUSCULAR | Status: AC
  Filled 2022-03-26: qty 2

## 2022-03-26 MED ORDER — FAMOTIDINE 20 MG PO TABS
ORAL_TABLET | ORAL | Status: AC
  Administered 2022-03-26: 20 mg via ORAL
  Filled 2022-03-26: qty 1

## 2022-03-26 MED ORDER — SEVOFLURANE IN SOLN
RESPIRATORY_TRACT | Status: AC
  Filled 2022-03-26: qty 250

## 2022-03-26 MED ORDER — APREPITANT 40 MG PO CAPS
ORAL_CAPSULE | ORAL | Status: AC
  Administered 2022-03-26: 40 mg via ORAL
  Filled 2022-03-26: qty 1

## 2022-03-26 MED ORDER — FENTANYL CITRATE (PF) 100 MCG/2ML IJ SOLN: 25.0000 ug | INTRAMUSCULAR | Status: DC | PRN

## 2022-03-26 MED ORDER — ONDANSETRON HCL 4 MG PO TABS: 4.0000 mg | ORAL_TABLET | Freq: Three times a day (TID) | ORAL | 0 refills | Status: DC | PRN

## 2022-03-26 MED ORDER — DEXAMETHASONE SODIUM PHOSPHATE 10 MG/ML IJ SOLN
INTRAMUSCULAR | Status: AC
  Filled 2022-03-26: qty 1

## 2022-03-26 MED ORDER — KETOROLAC TROMETHAMINE 30 MG/ML IJ SOLN
INTRAMUSCULAR | Status: DC | PRN
  Administered 2022-03-26: 30 mg via INTRAVENOUS

## 2022-03-26 MED ORDER — KETOROLAC TROMETHAMINE 30 MG/ML IJ SOLN
INTRAMUSCULAR | Status: AC
  Filled 2022-03-26: qty 1

## 2022-03-26 MED ORDER — LIDOCAINE HCL (PF) 1 % IJ SOLN
INTRAMUSCULAR | Status: AC
  Filled 2022-03-26: qty 30

## 2022-03-26 MED ORDER — EPINEPHRINE PF 1 MG/ML IJ SOLN
INTRAMUSCULAR | Status: AC
  Filled 2022-03-26: qty 4

## 2022-03-26 MED ORDER — LIDOCAINE HCL (CARDIAC) PF 100 MG/5ML IV SOSY
PREFILLED_SYRINGE | INTRAVENOUS | Status: DC | PRN
  Administered 2022-03-26: 100 mg via INTRAVENOUS

## 2022-03-26 MED ORDER — HYDROCODONE-ACETAMINOPHEN 5-325 MG PO TABS: 1.0000 | ORAL_TABLET | ORAL | 0 refills | Status: DC | PRN

## 2022-03-26 MED ORDER — ONDANSETRON HCL 4 MG/2ML IJ SOLN
INTRAMUSCULAR | Status: AC
  Filled 2022-03-26: qty 2

## 2022-03-26 MED ORDER — FENTANYL CITRATE (PF) 100 MCG/2ML IJ SOLN
INTRAMUSCULAR | Status: DC | PRN
  Administered 2022-03-26: 50 ug via INTRAVENOUS

## 2022-03-26 NOTE — Op Note (Signed)
03/26/2022 ? ?8:39 AM ? ?PATIENT:  Victoria Benson   ? ?PRE-OPERATIVE DIAGNOSIS:  Left Knee Arthrofibrosis ? ?POST-OPERATIVE DIAGNOSIS:  Same ? ?PROCEDURE:  CLOSED MANIPULATION LEFT KNEE ? ?SURGEON:  Thornton Park, MD ? ?ANESTHESIA:   General ? ?PREOPERATIVE INDICATIONS:  Victoria Benson is a  66 y.o. female with a diagnosis of Left Knee Arthrofibrosis who failed conservative measures and elected for a closed manipulation of the left knee under anesthesia versus arthroscopic debridement.   ? ?I discussed the risks and benefits of surgery with the patient and her husband of both closed manipulation and arthroscopic lysis of adhesions. The risks include but are not limited to infection if any scope was performed, bleeding, nerve or blood vessel injury, recurrent or persistent joint stiffness or loss of motion, persistent pain, weakness or instability, fracture, dislocation, tendon or ligament rupture and hardware failure and the need for further surgery.  Patient understood these risks and wished to proceed.  ? ?OPERATIVE FINDINGS: Range of motion from -5 to 85 degrees ? ?OPERATIVE PROCEDURE: Patient was met in the preoperative area with her husband by the bedside.  I marked the left knee according to hospital's correct site of surgery protocol.  Preop history and physical was performed.  I explained the details of the procedure as well as the postoperative course with the patient and her husband.  We discussed the risk and benefits as noted above and I answered all their questions. ? ?Patient was brought to the operating room where she was placed supine on the operative table.  Patient underwent general anesthesia with a facemask.  The patient was not intubated.   ? ?Once the anesthetic had taken effect, the patient was gently flexed with pressure over the proximal tibia and distal femur.  Slow gentle pressure was applied.  Audible and palpable crepitus was felt as peripatellar scar tissue gave way.  Patient was flexed to  approximately 115 to 120 degrees.  Patient was then brought through gentle ranges of motion.  Her patella was manipulated with the knee fully extended to increase patella mobility. ? ?At the conclusion of the case the patient had range of motion within a few degrees of full extension and flexion to approximately 115 to 120 degrees.  There were no complications during the case.  The patient was awoken and brought to the PACU in stable condition.  I was present for the entire procedure. ? ? ?

## 2022-03-26 NOTE — Transfer of Care (Addendum)
Immediate Anesthesia Transfer of Care Note ? ?Patient: Victoria Benson ? ?Procedure(s) Performed: CLOSED MANIPULATION KNEE (Left: Knee) ?ARTHROSCOPY KNEE (Left: Knee) ? ?Patient Location: PACU ? ?Anesthesia Type:General ? ?Level of Consciousness: awake ? ?Airway & Oxygen Therapy: Patient Spontanous Breathing and Patient connected to face mask ? ?Post-op Assessment: Report given to RN and Post -op Vital signs reviewed and stable ? ?Post vital signs: Reviewed and stable ? ?Last Vitals:  ?Vitals Value Taken Time  ?BP 100/62 03/26/22 0817  ?Temp    ?Pulse 87 03/26/22 0817  ?Resp 19 03/26/22 0817  ?SpO2 100 % 03/26/22 0817  ? ? ?Last Pain:  ?Vitals:  ? 03/26/22 0630  ?TempSrc: Temporal  ?PainSc: 0-No pain  ?   ? ?  ? ?Complications: No notable events documented. ?

## 2022-03-26 NOTE — Discharge Instructions (Addendum)
AMBULATORY SURGERY  ?DISCHARGE INSTRUCTIONS ? ? ?The drugs that you were given will stay in your system until tomorrow so for the next 24 hours you should not: ? ?Drive an automobile ?Make any legal decisions ?Drink any alcoholic beverage ? ? ?You may resume regular meals tomorrow.  Today it is better to start with liquids and gradually work up to solid foods. ? ?You may eat anything you prefer, but it is better to start with liquids, then soup and crackers, and gradually work up to solid foods. ? ? ?Please notify your doctor immediately if you have any unusual bleeding, trouble breathing, redness and pain at the surgery site, drainage, fever, or pain not relieved by medication. ? ? ? ?Additional Instructions: May take Ibuprofen anytime after 2:00 pm today if needed for pain.  May take up to 800 mg every 8 hours or 600 mg every 6 hours maximum as needed for pain. ? ? ?Please contact your physician with any problems or Same Day Surgery at [phone], Monday through Friday 6 am to 4 pm, or [place] at [facility] number at [phone].  ?

## 2022-03-26 NOTE — H&P (Signed)
PREOPERATIVE H&P ? ?Chief Complaint: Left Knee Arthrofibrosis s/p left TKA on 11/13/21 ? ?HPI: ?Victoria Benson is a 66 y.o. female who presents for preoperative history and physical with a diagnosis of Left Knee Arthrofibrosis s/p left TKA on 11/13/21. Symptoms of left knee stiffness are significantly impairing activities of daily living.  She has failed 4 months of postoperative physical therapy and wished to proceed with close manipulation versus arthroscopic lysis of adhesions. ? ?Past Medical History:  ?Diagnosis Date  ? BRCA gene mutation negative 07/10/2016  ? MyRisk neg  ? Breast cancer (Victoria Benson) 2009  ? T1c (1.2 cm) N0 ER 90%, PR 90%, HER-2/neu not amplified. Oncotype DX score, low risk (14) 9% chance of recurrence with antiestrogen therapy alone.  ? Breast cancer (Victoria Benson) 06/17/2016  ? INVASIVE MAMMARY CARCINOMA . T1, ER positive, PR positive, HER-2/neu not overexpressed  ? Diffuse large B-cell lymphoma (Victoria Benson) 11/2017  ? Right cervical adenopathy  ? Family history of adverse reaction to anesthesia   ? PTS MOM-HARD TO WAKE UP  ? Heart murmur   ? History of basal cell carcinoma 02/11/2011  ? right upper back  ? History of dysplastic nevus 06/20/2011  ? left scapula  ? Osteoarthritis   ? left knee  ? Personal history of chemotherapy 2019  ? lymphoma  ? Personal history of radiation therapy 2009  ? right breast cancer  ? Personal history of radiation therapy 2017  ? right breast cancer  ? PONV (postoperative nausea and vomiting)   ? Radiation 2009  ? BREAST CA  ? Skin cancer   ? ?Past Surgical History:  ?Procedure Laterality Date  ? APPENDECTOMY    ? BREAST BIOPSY Right 06/17/2016  ? invasive. T1, ER positive, PR positive, HER-2/neu not overexpressed  ? BREAST CYST ASPIRATION Left 2002  ? BREAST EXCISIONAL BIOPSY Right 06/2016  ? lumpectomy with rad   ? BREAST EXCISIONAL BIOPSY Right 2009  ? breast ca + mammo site radiation  ? BREAST LUMPECTOMY Right 2017  ? invasive mammary NEG margins  ? BREAST LUMPECTOMY Right 2009  ?  mammosite  ? BREAST LUMPECTOMY WITH NEEDLE LOCALIZATION Right 07/18/2016  ? Procedure: BREAST LUMPECTOMY WITH NEEDLE LOCALIZATION AND RIGHT AXILLARY EXPLORATION;  Surgeon: Robert Bellow, MD;  Location: [facility] ORS;  Service: General;  Laterality: Right;  ? BREAST SURGERY Right July 2009  ? Wide excision, sentinel left node biopsy MammoSite. ( 06/21/2008  ? CHOLECYSTECTOMY  1984  ? COLONOSCOPY WITH PROPOFOL N/A 10/13/2018  ? Procedure: COLONOSCOPY WITH PROPOFOL;  Surgeon: Lucilla Lame, MD;  Location: [facility] ENDOSCOPY;  Service: Endoscopy;  Laterality: N/A;  ? [place] OF UTERUS  2004  ? ENDOMETRIAL ABLATION  2005  ? IR FLUORO GUIDE PORT INSERTION RIGHT  01/15/2018  ? TOTAL KNEE ARTHROPLASTY Left 11/13/2021  ? Procedure: LEFT TOTAL KNEE ARTHROPLASTY;  Surgeon: Thornton Park, MD;  Location: [facility] ORS;  Service: Orthopedics;  Laterality: Left;  ? TUMOR REMOVAL  12/2013  ? from uterus done by [facility]  ? ?Social History  ? ?Socioeconomic History  ? Marital status: Married  ?  Spouse name: [name]  ? Number of children: 2  ? Years of education: 68  ? Highest education level: Not on file  ?Occupational History  ? Occupation: unemployed  ?Tobacco Use  ? Smoking status: Never  ? Smokeless tobacco: Never  ?Vaping Use  ? Vaping Use: Never used  ?Substance and Sexual Activity  ? Alcohol use: No  ? Drug use: No  ?  Sexual activity: Yes  ?  Birth control/protection: Post-menopausal  ?Other Topics Concern  ? Not on file  ?Social History Narrative  ? Not on file  ? ?Social Determinants of Health  ? ?Financial Resource Strain: Not on file  ?Food Insecurity: Not on file  ?Transportation Needs: Not on file  ?Physical Activity: Not on file  ?Stress: Not on file  ?Social Connections: Not on file  ? ?Family History  ?Problem Relation Age of Onset  ? CAD Mother   ? Osteoporosis Mother   ? Lung cancer Father   ? Diabetes Father   ? Hypertension Brother   ? Ovarian cancer Maternal Grandmother   ? Heart attack Paternal  Grandmother   ? Diabetes Paternal Grandfather   ? Fibromyalgia Brother   ? Hypertension Brother   ? Hypertension Brother   ? Sleep apnea Brother   ? Healthy Brother   ? Sleep apnea Brother   ? Ovarian cancer Other   ? Breast cancer Neg Hx   ? ?No Known Allergies ?Prior to Admission medications   ?Medication Sig Start Date End Date Taking? Authorizing Provider  ?Calcium Carb-Cholecalciferol (CALCIUM 600/VITAMIN D PO) Take 1 tablet by mouth daily.   Yes [provider]  ?HYDROcodone-acetaminophen (NORCO) 5-325 MG tablet Take 1 tablet by mouth every 4 (four) hours as needed for moderate pain. 03/26/22  Yes Thornton Park, MD  ?Multiple Vitamin (MULTIVITAMIN) capsule Take 1 capsule by mouth daily.   Yes [provider]  ?OMEGA-3 KRILL OIL PO Take 600 mg by mouth daily.   Yes [provider]  ?ondansetron (ZOFRAN) 4 MG tablet Take 1 tablet (4 mg total) by mouth every 8 (eight) hours as needed for nausea or vomiting. 03/26/22  Yes Thornton Park, MD  ?TART CHERRY PO Take 300 mg by mouth daily.   Yes [provider]  ?acetaminophen (TYLENOL) 325 MG tablet Take 325-650 mg by mouth every 6 (six) hours as needed for mild pain or moderate pain.    [provider]  ?cetirizine (ZYRTEC) 10 MG tablet Take 10 mg daily by mouth.    [provider]  ?fluticasone (FLONASE) 50 MCG/ACT nasal spray USE 2 SPRAYS INTO BOTH NOSTRILS DAILY 03/28/21   Virginia Crews, MD  ? ? ? ?Positive ROS: All other systems have been reviewed and were otherwise negative with the exception of those mentioned in the HPI and as above. ? ?Physical Exam: ?General: Alert, no acute distress ?Cardiovascular: Regular rate and rhythm. No pedal edema ?Respiratory: No distress.  Regular rate.  no cyanosis, no use of accessory musculature ?Skin: Skin intact, no lesions within the operative field. ?Neurologic: Sensation intact distally ?Psychiatric: Patient is competent for consent with normal mood and  affect ? ?MUSCULOSKELETAL:  ? ?Assessment: ?Left Knee Arthrofibrosis ? ?Plan: ?Plan for Procedure(s): ?CLOSED MANIPULATION OF LEFT KNEE VS. ARTHROSCOPIC LYSIS OF ADHESIONS ? ?I reviewed the details of the operation as well as the postoperative course with the patient and her husband.  I signed the left knee according to hospital prescription site of surgery protocol.  Answered all her questions. ? ?I discussed the risks and benefits of the procedure with them.  The risks include but are not limited to infection, bleeding, nerve or blood vessel injury, persistent or recurrent joint stiffness or loss of motion, persistent pain, weakness or instability, fracture, dislocation, tendon or ligament rupture and hardware failure or loosening and the need for further surgery.  Patient understood these risks and wished to proceed.  ? ? ?  Thornton Park, MD ? ? ?03/26/2022 ?8:47 AM ?  ?

## 2022-03-26 NOTE — Anesthesia Preprocedure Evaluation (Addendum)
Anesthesia Evaluation  ?Patient identified by MRN, date of birth, ID band ?Patient awake ? ? ? ?Reviewed: ?Allergy & Precautions, NPO status , Patient's Chart, lab work & pertinent test results ? ?History of Anesthesia Complications ?(+) PONV and history of anesthetic complications ? ?Airway ?Mallampati: II ? ?TM Distance: >3 FB ?Neck ROM: Full ? ? ? Dental ?no notable dental hx. ?(+) Teeth Intact ?  ?Pulmonary ?neg pulmonary ROS, neg sleep apnea, neg COPD, Patient abstained from smoking.Not current smoker,  ?  ?Pulmonary exam normal ?breath sounds clear to auscultation ? ? ? ? ? ? Cardiovascular ?Exercise Tolerance: Good ?METS(-) hypertension+CHF  ?(-) CAD and (-) Past MI (-) dysrhythmias  ?Rhythm:Regular Rate:Normal ?- Systolic murmurs ?TTE 2022: ?1. Left ventricular ejection fraction, by estimation, is 60 to 65%. The  ?left ventricle has normal function. The left ventricle has no regional  ?wall motion abnormalities. Left ventricular diastolic parameters are  ?consistent with Grade II diastolic  ?dysfunction (pseudonormalization).  ??2. Right ventricular systolic function is normal. The right ventricular  ?size is normal.  ??3. Left atrial size was mildly dilated.  ??4. The mitral valve is normal in structure. Mild mitral valve  ?regurgitation. No evidence of mitral stenosis.  ??5. The aortic valve is normal in structure. Aortic valve regurgitation is  ?not visualized. Aortic valve sclerosis is present, with no evidence of  ?aortic valve stenosis.  ??6. The inferior vena cava is normal in size with greater than 50%  ?respiratory variability, suggesting right atrial pressure of 3 mmHg.  ?  ?Neuro/Psych ?negative neurological ROS ? negative psych ROS  ? GI/Hepatic ?neg GERD  ,(+)  ?  ? (-) substance abuse ? ,   ?Endo/Other  ?neg diabetesHypothyroidism  ? Renal/GU ?negative Renal ROS  ? ?  ?Musculoskeletal ? ?(+) Arthritis ,  ? Abdominal ?  ?Peds ? Hematology ?  ?Anesthesia Other  Findings ?Past Medical History: ?07/10/2016: BRCA gene mutation negative ?    Comment:  MyRisk neg ?2009: Breast cancer ([place]) ?    Comment:  T1c (1.2 cm) N0 ER 90%, PR 90%, HER-2/neu not amplified. ?             Oncotype DX score, low risk (14) 9% chance of recurrence  ?             with antiestrogen therapy alone. ?06/17/2016: Breast cancer ([place]) ?    Comment:  INVASIVE MAMMARY CARCINOMA . T1, ER positive, PR  ?             positive, HER-2/neu not overexpressed ?11/2017: Diffuse large B-cell lymphoma ([place]) ?    Comment:  Right cervical adenopathy ?No date: Family history of adverse reaction to anesthesia ?    Comment:  PTS MOM-HARD TO WAKE UP ?No date: Heart murmur ?02/11/2011: History of basal cell carcinoma ?    Comment:  right upper back ?06/20/2011: History of dysplastic nevus ?    Comment:  left scapula ?No date: Osteoarthritis ?    Comment:  left knee ?2019: Personal history of chemotherapy ?    Comment:  lymphoma ?2009: Personal history of radiation therapy ?    Comment:  right breast cancer ?2017: Personal history of radiation therapy ?    Comment:  right breast cancer ?No date: PONV (postoperative nausea and vomiting) ?2009: Radiation ?    Comment:  BREAST CA ?No date: Skin cancer ? Reproductive/Obstetrics ? ?  ? ? ? ? ? ? ? ? ? ? ? ? ? ?  ?  ? ? ? ? ? ? ? ?  Anesthesia Physical ?Anesthesia Plan ? ?ASA: 2 ? ?Anesthesia Plan: General  ? ?Post-op Pain Management: Ofirmev IV (intra-op)*  ? ?Induction: Intravenous ? ?PONV Risk Score and Plan: 4 or greater and Ondansetron, Dexamethasone and Midazolam ? ?Airway Management Planned: Natural Airway and LMA ? ?Additional Equipment: None ? ?Intra-op Plan:  ? ?Post-operative Plan: Extubation in OR ? ?Informed Consent: I have reviewed the patients History and Physical, chart, labs and discussed the procedure including the risks, benefits and alternatives for the proposed anesthesia with the patient or authorized representative who has indicated his/her understanding and  acceptance.  ? ? ? ?Dental advisory given ? ?Plan Discussed with: CRNA and Surgeon ? ?Anesthesia Plan Comments: (Discussed risks of anesthesia with patient, including PONV, sore throat, lip/dental/eye damage. Rare risks discussed as well, such as cardiorespiratory and neurological sequelae, and allergic reactions. Discussed the role of CRNA in patient's perioperative care. Patient understands. ?Natural airway vs LMA depending on likelihood of converting to arthroscopy.)  ? ? ? ? ? ? ?Anesthesia Quick Evaluation ? ?

## 2022-03-26 NOTE — Anesthesia Postprocedure Evaluation (Signed)
Anesthesia Post Note ? ?Patient: Victoria Benson ? ?Procedure(s) Performed: CLOSED MANIPULATION KNEE (Left: Knee) ?ARTHROSCOPY KNEE (Left: Knee) ? ?Patient location during evaluation: PACU ?Anesthesia Type: General ?Level of consciousness: awake and alert ?Pain management: pain level controlled ?Vital Signs Assessment: post-procedure vital signs reviewed and stable ?Respiratory status: spontaneous breathing, nonlabored ventilation, respiratory function stable and patient connected to nasal cannula oxygen ?Cardiovascular status: blood pressure returned to baseline and stable ?Postop Assessment: no apparent nausea or vomiting ?Anesthetic complications: no ? ? ?No notable events documented. ? ? ?Last Vitals:  ?Vitals:  ? 03/26/22 0830 03/26/22 0841  ?BP: 100/65 110/64  ?Pulse: 83 75  ?Resp: (!) 21 18  ?Temp:  36.5 ?C  ?SpO2: 97% 100%  ?  ?Last Pain:  ?Vitals:  ? 03/26/22 0841  ?TempSrc: Temporal  ?PainSc: 2   ? ? ?  ?  ?  ?  ?  ?  ? ?Arita Miss ? ? ? ? ?

## 2022-03-27 ENCOUNTER — Encounter: Payer: Self-pay | Admitting: Orthopedic Surgery

## 2022-04-22 ENCOUNTER — Inpatient Hospital Stay (HOSPITAL_BASED_OUTPATIENT_CLINIC_OR_DEPARTMENT_OTHER): Payer: Managed Care, Other (non HMO) | Admitting: Oncology

## 2022-04-22 ENCOUNTER — Inpatient Hospital Stay: Payer: Managed Care, Other (non HMO) | Attending: Oncology

## 2022-04-22 ENCOUNTER — Encounter: Payer: Self-pay | Admitting: Oncology

## 2022-04-22 VITALS — BP 119/68 | HR 75 | Temp 97.2°F | Resp 16 | Wt 165.4 lb

## 2022-04-22 DIAGNOSIS — Z801 Family history of malignant neoplasm of trachea, bronchus and lung: Secondary | ICD-10-CM | POA: Insufficient documentation

## 2022-04-22 DIAGNOSIS — Z08 Encounter for follow-up examination after completed treatment for malignant neoplasm: Secondary | ICD-10-CM | POA: Diagnosis not present

## 2022-04-22 DIAGNOSIS — Z8579 Personal history of other malignant neoplasms of lymphoid, hematopoietic and related tissues: Secondary | ICD-10-CM

## 2022-04-22 DIAGNOSIS — Z8041 Family history of malignant neoplasm of ovary: Secondary | ICD-10-CM | POA: Diagnosis not present

## 2022-04-22 DIAGNOSIS — C8330 Diffuse large B-cell lymphoma, unspecified site: Secondary | ICD-10-CM | POA: Insufficient documentation

## 2022-04-22 DIAGNOSIS — C8331 Diffuse large B-cell lymphoma, lymph nodes of head, face, and neck: Secondary | ICD-10-CM

## 2022-04-22 LAB — COMPREHENSIVE METABOLIC PANEL
ALT: 18 U/L (ref 0–44)
AST: 16 U/L (ref 15–41)
Albumin: 4.2 g/dL (ref 3.5–5.0)
Alkaline Phosphatase: 72 U/L (ref 38–126)
Anion gap: 10 (ref 5–15)
BUN: 12 mg/dL (ref 8–23)
CO2: 29 mmol/L (ref 22–32)
Calcium: 9.6 mg/dL (ref 8.9–10.3)
Chloride: 102 mmol/L (ref 98–111)
Creatinine, Ser: 0.51 mg/dL (ref 0.44–1.00)
GFR, Estimated: >60 mL/min (ref >60)
Glucose, Bld: 119 mg/dL — ABNORMAL HIGH (ref 70–99)
Potassium: 4.1 mmol/L (ref 3.5–5.1)
Sodium: 141 mmol/L (ref 135–145)
Total Bilirubin: 0.7 mg/dL (ref 0.3–1.2)
Total Protein: 6.8 g/dL (ref 6.5–8.1)

## 2022-04-22 LAB — CBC WITH DIFFERENTIAL/PLATELET
Abs Immature Granulocytes: 0.01 10*3/uL (ref 0.00–0.07)
Basophils Absolute: 0 10*3/uL (ref 0.0–0.1)
Basophils Relative: 1 %
Eosinophils Absolute: 0.1 10*3/uL (ref 0.0–0.5)
Eosinophils Relative: 1 %
HCT: 42.6 % (ref 36.0–46.0)
Hemoglobin: 14 g/dL (ref 12.0–15.0)
Immature Granulocytes: 0 %
Lymphocytes Relative: 24 %
Lymphs Abs: 1.4 10*3/uL (ref 0.7–4.0)
MCH: 29.5 pg (ref 26.0–34.0)
MCHC: 32.9 g/dL (ref 30.0–36.0)
MCV: 89.7 fL (ref 80.0–100.0)
Monocytes Absolute: 0.4 10*3/uL (ref 0.1–1.0)
Monocytes Relative: 8 %
Neutro Abs: 3.9 10*3/uL (ref 1.7–7.7)
Neutrophils Relative %: 66 %
Platelets: 175 10*3/uL (ref 150–400)
RBC: 4.75 MIL/uL (ref 3.87–5.11)
RDW: 13.1 % (ref 11.5–15.5)
WBC: 5.8 10*3/uL (ref 4.0–10.5)
nRBC: 0 % (ref 0.0–0.2)

## 2022-04-22 LAB — LACTATE DEHYDROGENASE: LDH: 114 U/L (ref 98–192)

## 2022-04-22 NOTE — Progress Notes (Signed)
? ? ? ?Hematology/Oncology Consult note ?[place]  ?[phone] [phone] ? ?Patient Care Team: ?Virginia Crews, MD as PCP - General (Family Medicine) ?Margarita Rana, MD as Referring Physician (Family Medicine) ?Robert Bellow, MD (General Surgery) ?Sindy Guadeloupe, MD as Consulting Physician (Oncology) ?Noreene Filbert, MD as Referring Physician (Radiation Oncology)  ? ?Name of the patient: Victoria Benson  ?[id number]  ?1956/01/15  ? ?Date of visit: 04/22/22 ? ?Diagnosis- stage I diffuse large B-cell lymphoma in CR 1 ? ?Chief complaint/ Reason for visit-routine follow-up of diffuse large B-cell lymphoma ? ?Heme/Onc history: patient is a 66 year old female who developed symptoms of upper respiratory infection and nasal congestion sometime in November 2018.  Around the same time she also developed a right neck mass which was initially attributed to possible tonsillitis and she was given antibiotics for the same.  However her right neck mass did not decrease in size and she eventually saw Dr. Pryor Ochoa from ENT.  Patient was found to have significant right cervical adenopathy and underwent CT of the soft tissue neck which showed right level 2 and 3 adenopathy favoring metastatic carcinoma.  Jugulodigastric node measuring up to 3.7 cm.  Asymmetric fullness of the right tonsillar fossa possibly primary site.  ?  ?Patient underwent core biopsy of the right cervical lymph node. Pathology showed diffuse large B-cell lymphoma of the ABC subtype.  IHC was positive for BCL-2 and BCL 6 see Mick IHC was not tested due to insufficiency of the testing sample.  Ki-67 was high 80-90%. ?  ?PET/CT scan showed hypermetabolism in the tonsillar fossa bilaterally right greater than left as well as hypermetabolic lymphadenopathy in the right neck at the level 2 and level 3.  No evidence of contralateral cervical adenopathy and no evidence of metastatic disease elsewhere ?  ?Bone marrow biopsy  showed no evidence of lymphoma. IPI score 0- low risk. Does not meet criteria for CNS prophylaxis ?  ?Patient finished 3 cycles of RCHOP on 02/27/18.PET showed no active disease. She then completed IFRT and is in CR1 ? ?Interval history-she underwent left knee replacement surgery about 5 months ago and still recovering from the same.  Has left knee pain on ambulation.  Denies other complaints at this time ? ?ECOG PS- 1 ?Pain scale- 0 ? ? ?Review of systems- Review of Systems  ?Constitutional:  Negative for chills, fever, malaise/fatigue and weight loss.  ?HENT:  Negative for congestion, ear discharge and nosebleeds.   ?Eyes:  Negative for blurred vision.  ?Respiratory:  Negative for cough, hemoptysis, sputum production, shortness of breath and wheezing.   ?Cardiovascular:  Negative for chest pain, palpitations, orthopnea and claudication.  ?Gastrointestinal:  Negative for abdominal pain, blood in stool, constipation, diarrhea, heartburn, melena, nausea and vomiting.  ?Genitourinary:  Negative for dysuria, flank pain, frequency, hematuria and urgency.  ?Musculoskeletal:  Negative for back pain, joint pain and myalgias.  ?Skin:  Negative for rash.  ?Neurological:  Negative for dizziness, tingling, focal weakness, seizures, weakness and headaches.  ?Endo/Heme/Allergies:  Does not bruise/bleed easily.  ?Psychiatric/Behavioral:  Negative for depression and suicidal ideas. The patient does not have insomnia.    ? ? ? ?No Known Allergies ? ? ?Past Medical History:  ?Diagnosis Date  ? BRCA gene mutation negative 07/10/2016  ? MyRisk neg  ? Breast cancer ([place]) 2009  ? T1c (1.2 cm) N0 ER 90%, PR 90%, HER-2/neu not amplified. Oncotype DX score, low risk (14) 9% chance of recurrence with antiestrogen therapy alone.  ?  Breast cancer ([place]) 06/17/2016  ? INVASIVE MAMMARY CARCINOMA . T1, ER positive, PR positive, HER-2/neu not overexpressed  ? Diffuse large B-cell lymphoma ([place]) 11/2017  ? Right cervical adenopathy  ? Family history  of adverse reaction to anesthesia   ? PTS MOM-HARD TO WAKE UP  ? Heart murmur   ? History of basal cell carcinoma 02/11/2011  ? right upper back  ? History of dysplastic nevus 06/20/2011  ? left scapula  ? Osteoarthritis   ? left knee  ? Personal history of chemotherapy 2019  ? lymphoma  ? Personal history of radiation therapy 2009  ? right breast cancer  ? Personal history of radiation therapy 2017  ? right breast cancer  ? PONV (postoperative nausea and vomiting)   ? Radiation 2009  ? BREAST CA  ? Skin cancer   ? ? ? ?Past Surgical History:  ?Procedure Laterality Date  ? APPENDECTOMY    ? BREAST BIOPSY Right 06/17/2016  ? invasive. T1, ER positive, PR positive, HER-2/neu not overexpressed  ? BREAST CYST ASPIRATION Left 2002  ? BREAST EXCISIONAL BIOPSY Right 06/2016  ? lumpectomy with rad   ? BREAST EXCISIONAL BIOPSY Right 2009  ? breast ca + mammo site radiation  ? BREAST LUMPECTOMY Right 2017  ? invasive mammary NEG margins  ? BREAST LUMPECTOMY Right 2009  ? mammosite  ? BREAST LUMPECTOMY WITH NEEDLE LOCALIZATION Right 07/18/2016  ? Procedure: BREAST LUMPECTOMY WITH NEEDLE LOCALIZATION AND RIGHT AXILLARY EXPLORATION;  Surgeon: Robert Bellow, MD;  Location: [facility] ORS;  Service: General;  Laterality: Right;  ? BREAST SURGERY Right July 2009  ? Wide excision, sentinel left node biopsy MammoSite. ( 06/21/2008  ? CHOLECYSTECTOMY  1984  ? COLONOSCOPY WITH PROPOFOL N/A 10/13/2018  ? Procedure: COLONOSCOPY WITH PROPOFOL;  Surgeon: Lucilla Lame, MD;  Location[facility] ENDOSCOPY;  Service: Endoscopy;  Laterality: N/A;  ? [place] OF UTERUS  2004  ? ENDOMETRIAL ABLATION  2005  ? IR FLUORO GUIDE PORT INSERTION RIGHT  01/15/2018  ? KNEE ARTHROSCOPY Left 03/26/2022  ? Procedure: ARTHROSCOPY KNEE;  Surgeon: Thornton Park, MD;  Location: [facility] ORS;  Service: Orthopedics;  Laterality: Left;  ? KNEE CLOSED REDUCTION Left 03/26/2022  ? Procedure: CLOSED MANIPULATION KNEE;  Surgeon: Thornton Park, MD;  Location: [facility]  ORS;  Service: Orthopedics;  Laterality: Left;  ? TOTAL KNEE ARTHROPLASTY Left 11/13/2021  ? Procedure: LEFT TOTAL KNEE ARTHROPLASTY;  Surgeon: Thornton Park, MD;  Location: [facility] ORS;  Service: Orthopedics;  Laterality: Left;  ? TUMOR REMOVAL  12/2013  ? from uterus done by [facility]  ? ? ?Social History  ? ?Socioeconomic History  ? Marital status: Married  ?  Spouse name: [name]  ? Number of children: 2  ? Years of education: 31  ? Highest education level: Not on file  ?Occupational History  ? Occupation: unemployed  ?Tobacco Use  ? Smoking status: Never  ? Smokeless tobacco: Never  ?Vaping Use  ? Vaping Use: Never used  ?Substance and Sexual Activity  ? Alcohol use: No  ? Drug use: No  ? Sexual activity: Yes  ?  Birth control/protection: Post-menopausal  ?Other Topics Concern  ? Not on file  ?Social History Narrative  ? Not on file  ? ?Social Determinants of Health  ? ?Financial Resource Strain: Not on file  ?Food Insecurity: Not on file  ?Transportation Needs: Not on file  ?Physical Activity: Not on file  ?Stress: Not on file  ?Social Connections: Not on file  ?  Intimate Partner Violence: Not on file  ? ? ?Family History  ?Problem Relation Age of Onset  ? CAD Mother   ? Osteoporosis Mother   ? Lung cancer Father   ? Diabetes Father   ? Hypertension Brother   ? Ovarian cancer Maternal Grandmother   ? Heart attack Paternal Grandmother   ? Diabetes Paternal Grandfather   ? Fibromyalgia Brother   ? Hypertension Brother   ? Hypertension Brother   ? Sleep apnea Brother   ? Healthy Brother   ? Sleep apnea Brother   ? Ovarian cancer Other   ? Breast cancer Neg Hx   ? ? ? ?Current Outpatient Medications:  ?  Calcium Carb-Cholecalciferol (CALCIUM 600/VITAMIN D PO), Take 1 tablet by mouth daily., Disp: , Rfl:  ?  fluticasone (FLONASE) 50 MCG/ACT nasal spray, USE 2 SPRAYS INTO BOTH NOSTRILS DAILY, Disp: 16 g, Rfl: 5 ?  Multiple Vitamin (MULTIVITAMIN) capsule, Take 1 capsule by mouth daily., Disp: , Rfl:  ?  OMEGA-3 KRILL  OIL PO, Take 600 mg by mouth daily., Disp: , Rfl:  ?  TART CHERRY PO, Take 300 mg by mouth daily., Disp: , Rfl:  ?  acetaminophen (TYLENOL) 325 MG tablet, Take 325-650 mg by mouth every 6 (six) hours a

## 2022-10-21 ENCOUNTER — Telehealth: Payer: Self-pay

## 2022-10-21 DIAGNOSIS — C50211 Malignant neoplasm of upper-inner quadrant of right female breast: Secondary | ICD-10-CM

## 2022-10-21 DIAGNOSIS — Z1231 Encounter for screening mammogram for malignant neoplasm of breast: Secondary | ICD-10-CM

## 2022-10-21 NOTE — Telephone Encounter (Signed)
Okay to order screening mammogram prior to physical

## 2022-10-21 NOTE — Telephone Encounter (Signed)
Copied from [phone]. Topic: General - Inquiry >> Oct 21, 2022 12:50 PM Marcellus Scott wrote: Reason for CRM: Pt stated she is due to the mammogram, pt is requesting an order to be sent by PCP. Requesting it be sent to [place] at [facility].   Please advise. >> Oct 21, 2022 12:53 PM Marcellus Scott wrote: Pt stated she is due for a mammogram, pt is requesting an order to be sent by PCP. Requesting it be sent to [place] at [facility].   Please advise.

## 2022-10-21 NOTE — Telephone Encounter (Signed)
Patient scheduled for physical Nov 6. Would like to have mammogram schedule before. Is this ok to put orders in?

## 2022-10-22 ENCOUNTER — Inpatient Hospital Stay: Payer: Managed Care, Other (non HMO) | Attending: Oncology

## 2022-10-22 ENCOUNTER — Inpatient Hospital Stay (HOSPITAL_BASED_OUTPATIENT_CLINIC_OR_DEPARTMENT_OTHER): Payer: Managed Care, Other (non HMO) | Admitting: Nurse Practitioner

## 2022-10-22 ENCOUNTER — Encounter: Payer: Self-pay | Admitting: Nurse Practitioner

## 2022-10-22 DIAGNOSIS — Z08 Encounter for follow-up examination after completed treatment for malignant neoplasm: Secondary | ICD-10-CM

## 2022-10-22 DIAGNOSIS — Z85828 Personal history of other malignant neoplasm of skin: Secondary | ICD-10-CM | POA: Insufficient documentation

## 2022-10-22 DIAGNOSIS — Z8579 Personal history of other malignant neoplasms of lymphoid, hematopoietic and related tissues: Secondary | ICD-10-CM

## 2022-10-22 DIAGNOSIS — Z8041 Family history of malignant neoplasm of ovary: Secondary | ICD-10-CM | POA: Diagnosis not present

## 2022-10-22 DIAGNOSIS — Z801 Family history of malignant neoplasm of trachea, bronchus and lung: Secondary | ICD-10-CM | POA: Insufficient documentation

## 2022-10-22 DIAGNOSIS — C8331 Diffuse large B-cell lymphoma, lymph nodes of head, face, and neck: Secondary | ICD-10-CM | POA: Diagnosis present

## 2022-10-22 DIAGNOSIS — Z853 Personal history of malignant neoplasm of breast: Secondary | ICD-10-CM | POA: Diagnosis not present

## 2022-10-22 DIAGNOSIS — Z923 Personal history of irradiation: Secondary | ICD-10-CM | POA: Diagnosis not present

## 2022-10-22 LAB — COMPREHENSIVE METABOLIC PANEL
ALT: 24 U/L (ref 0–44)
AST: 22 U/L (ref 15–41)
Albumin: 4.2 g/dL (ref 3.5–5.0)
Alkaline Phosphatase: 88 U/L (ref 38–126)
Anion gap: 7 (ref 5–15)
BUN: 10 mg/dL (ref 8–23)
CO2: 29 mmol/L (ref 22–32)
Calcium: 9.4 mg/dL (ref 8.9–10.3)
Chloride: 104 mmol/L (ref 98–111)
Creatinine, Ser: 0.65 mg/dL (ref 0.44–1.00)
GFR, Estimated: >60 mL/min (ref >60)
Glucose, Bld: 100 mg/dL — ABNORMAL HIGH (ref 70–99)
Potassium: 4.5 mmol/L (ref 3.5–5.1)
Sodium: 140 mmol/L (ref 135–145)
Total Bilirubin: 0.8 mg/dL (ref 0.3–1.2)
Total Protein: 7.3 g/dL (ref 6.5–8.1)

## 2022-10-22 LAB — CBC WITH DIFFERENTIAL/PLATELET
Abs Immature Granulocytes: 0 10*3/uL (ref 0.00–0.07)
Basophils Absolute: 0 10*3/uL (ref 0.0–0.1)
Basophils Relative: 1 %
Eosinophils Absolute: 0.1 10*3/uL (ref 0.0–0.5)
Eosinophils Relative: 1 %
HCT: 42.8 % (ref 36.0–46.0)
Hemoglobin: 14.2 g/dL (ref 12.0–15.0)
Immature Granulocytes: 0 %
Lymphocytes Relative: 22 %
Lymphs Abs: 1.3 10*3/uL (ref 0.7–4.0)
MCH: 29.5 pg (ref 26.0–34.0)
MCHC: 33.2 g/dL (ref 30.0–36.0)
MCV: 88.8 fL (ref 80.0–100.0)
Monocytes Absolute: 0.6 10*3/uL (ref 0.1–1.0)
Monocytes Relative: 10 %
Neutro Abs: 4 10*3/uL (ref 1.7–7.7)
Neutrophils Relative %: 66 %
Platelets: 174 10*3/uL (ref 150–400)
RBC: 4.82 MIL/uL (ref 3.87–5.11)
RDW: 12.7 % (ref 11.5–15.5)
WBC: 6.1 10*3/uL (ref 4.0–10.5)
nRBC: 0 % (ref 0.0–0.2)

## 2022-10-22 LAB — LACTATE DEHYDROGENASE: LDH: 129 U/L (ref 98–192)

## 2022-10-22 NOTE — Progress Notes (Signed)
Hematology/Oncology Consult Note [facility]  Telephone:([phone] [phone]  Patient Care Team: Virginia Crews, MD as PCP - General (Family Medicine) Margarita Rana, MD as Referring Physician (Family Medicine) Bary Castilla, Forest Gleason, MD (General Surgery) Sindy Guadeloupe, MD as Consulting Physician (Oncology) Noreene Filbert, MD as Referring Physician (Radiation Oncology)   Name of the patient: Victoria Benson  [id number]  Mar 02, 1956   Date of visit: 10/22/22  Diagnosis- stage I diffuse large B-cell lymphoma in CR 1  Chief complaint/ Reason for visit-routine follow-up of diffuse large B-cell lymphoma  Heme/Onc history: patient is a 66 year old female who developed symptoms of upper respiratory infection and nasal congestion sometime in November 2018.  Around the same time she also developed a right neck mass which was initially attributed to possible tonsillitis and she was given antibiotics for the same.  However her right neck mass did not decrease in size and she eventually saw Dr. Pryor Ochoa from ENT.  Patient was found to have significant right cervical adenopathy and underwent CT of the soft tissue neck which showed right level 2 and 3 adenopathy favoring metastatic carcinoma.  Jugulodigastric node measuring up to 3.7 cm.  Asymmetric fullness of the right tonsillar fossa possibly primary site.    Patient underwent core biopsy of the right cervical lymph node. Pathology showed diffuse large B-cell lymphoma of the ABC subtype.  IHC was positive for BCL-2 and BCL 6 see Mick IHC was not tested due to insufficiency of the testing sample.  Ki-67 was high 80-90%.   PET/CT scan showed hypermetabolism in the tonsillar fossa bilaterally right greater than left as well as hypermetabolic lymphadenopathy in the right neck at the level 2 and level 3.  No evidence of contralateral cervical adenopathy and no evidence of metastatic disease elsewhere   Bone marrow biopsy  showed no evidence of lymphoma. IPI score 0- low risk. Does not meet criteria for CNS prophylaxis   Patient finished 3 cycles of RCHOP on 02/27/18.PET showed no active disease. She then completed IFRT and is in CR1  Interval history- Patient is 66 year old female with history of stage I diffuse large B-cell lymphoma status post 3 cycles of R-CHOP chemotherapy and IMRT currently on surveillance who returns for follow-up visit.  She continues to feel well and denies complaints.  ECOG PS- 0 Pain scale- 0  Review of systems- Review of Systems  Constitutional:  Negative for chills, fever, malaise/fatigue and weight loss.  HENT:  Negative for congestion, ear discharge and nosebleeds.   Eyes:  Negative for blurred vision.  Respiratory:  Negative for cough, hemoptysis, sputum production, shortness of breath and wheezing.   Cardiovascular:  Negative for chest pain, palpitations, orthopnea and claudication.  Gastrointestinal:  Negative for abdominal pain, blood in stool, constipation, diarrhea, heartburn, melena, nausea and vomiting.  Genitourinary:  Negative for dysuria, flank pain, frequency, hematuria and urgency.  Musculoskeletal:  Negative for back pain, joint pain and myalgias.  Skin:  Negative for rash.  Neurological:  Negative for dizziness, tingling, focal weakness, seizures, weakness and headaches.  Endo/Heme/Allergies:  Does not bruise/bleed easily.  Psychiatric/Behavioral:  Negative for depression and suicidal ideas. The patient does not have insomnia.      No Known Allergies   Past Medical History:  Diagnosis Date   BRCA gene mutation negative 07/10/2016   MyRisk neg   Breast cancer ([place]) 2009   T1c (1.2 cm) N0 ER 90%, PR 90%, HER-2/neu not amplified. Oncotype DX score, low risk (14) 9%  chance of recurrence with antiestrogen therapy alone.   Breast cancer ([place]) 06/17/2016   INVASIVE MAMMARY CARCINOMA . T1, ER positive, PR positive, HER-2/neu not overexpressed   Diffuse large  B-cell lymphoma ([place]) 11/2017   Right cervical adenopathy   Family history of adverse reaction to anesthesia    PTS MOM-HARD TO WAKE UP   Heart murmur    History of basal cell carcinoma 02/11/2011   right upper back   History of dysplastic nevus 06/20/2011   left scapula   Osteoarthritis    left knee   Personal history of chemotherapy 2019   lymphoma   Personal history of radiation therapy 2009   right breast cancer   Personal history of radiation therapy 2017   right breast cancer   PONV (postoperative nausea and vomiting)    Radiation 2009   BREAST CA   Skin cancer      Past Surgical History:  Procedure Laterality Date   APPENDECTOMY     BREAST BIOPSY Right 06/17/2016   invasive. T1, ER positive, PR positive, HER-2/neu not overexpressed   BREAST CYST ASPIRATION Left 2002   BREAST EXCISIONAL BIOPSY Right 06/2016   lumpectomy with rad    BREAST EXCISIONAL BIOPSY Right 2009   breast ca + mammo site radiation   BREAST LUMPECTOMY Right 2017   invasive mammary NEG margins   BREAST LUMPECTOMY Right 2009   mammosite   BREAST LUMPECTOMY WITH NEEDLE LOCALIZATION Right 07/18/2016   Procedure: BREAST LUMPECTOMY WITH NEEDLE LOCALIZATION AND RIGHT AXILLARY EXPLORATION;  Surgeon: Robert Bellow, MD;  Location: [facility] ORS;  Service: General;  Laterality: Right;   BREAST SURGERY Right July 2009   Wide excision, sentinel left node biopsy MammoSite. ( 06/21/2008   CHOLECYSTECTOMY  1984   COLONOSCOPY WITH PROPOFOL N/A 10/13/2018   Procedure: COLONOSCOPY WITH PROPOFOL;  Surgeon: Lucilla Lame, MD;  Location: [facility] END[facility];  Service: Endoscopy;  Laterality: N/A;   DILATION AND CURETTAGE OF UTERUS  2004   ENDOMETRIAL ABLATION  2005   IR FLUORO GUIDE PORT INSERTION RIGHT  01/15/2018   KNEE ARTHROSCOPY Left 03/26/2022   Procedure: ARTHROSCOPY KNEE;  Surgeon: Thornton Park, MD;  Location: [facility] OR[facility];  Service: Orthopedics;  Laterality: Left;   KNEE CLOSED REDUCTION Left 03/26/2022    Procedure: CLOSED MANIPULATION KNEE;  Surgeon: Thornton Park, MD;  Location: [facility] ORS;  Service: Orthopedics;  Laterality: Left;   TOTAL KNEE ARTHROPLASTY Left 11/13/2021   Procedure: LEFT TOTAL KNEE ARTHROPLASTY;  Surgeon: Thornton Park, MD;  Location: [facility] OR[facility];  Service: Orthopedics;  Laterality: Left;   TUMOR REMOVAL  12/2013   from uterus done by [facility]    Social History   Socioeconomic History   Marital status: Married    Spouse name: [name]   Number of children: 2   Years of education: 12   Highest education level: Not on file  Occupational History   Occupation: unemployed  Tobacco Use   Smoking status: Never   Smokeless tobacco: Never  Vaping Use   Vaping Use: Never used  Substance and Sexual Activity   Alcohol use: No   Drug use: No   Sexual activity: Yes    Birth control/protection: Post-menopausal  Other Topics Concern   Not on file  Social History Narrative   Not on file   Social Determinants of Health   Financial Resource Strain: Not on file  Food Insecurity: Not on file  Transportation Needs: Not on file  Physical Activity: Not on file  Stress: Not  on file  Social Connections: Not on file  Intimate Partner Violence: Not on file    Family History  Problem Relation Age of Onset   CAD Mother    Osteoporosis Mother    Lung cancer Father    Diabetes Father    Hypertension Brother    Ovarian cancer Maternal Grandmother    Heart attack Paternal Grandmother    Diabetes Paternal Grandfather    Fibromyalgia Brother    Hypertension Brother    Hypertension Brother    Sleep apnea Brother    Healthy Brother    Sleep apnea Brother    Ovarian cancer Other    Breast cancer Neg Hx      Current Outpatient Medications:    Calcium Carb-Cholecalciferol (CALCIUM 600/VITAMIN D PO), Take 1 tablet by mouth daily., Disp: , Rfl:    cetirizine (ZYRTEC) 10 MG tablet, Take 10 mg by mouth daily., Disp: , Rfl:    fluticasone (FLONASE) 50 MCG/ACT nasal spray,  USE 2 SPRAYS INTO BOTH NOSTRILS DAILY, Disp: 16 g, Rfl: 5   Multiple Vitamin (MULTIVITAMIN) capsule, Take 1 capsule by mouth daily., Disp: , Rfl:    OMEGA-3 KRILL OIL PO, Take 600 mg by mouth daily., Disp: , Rfl:   Physical exam:  There were no vitals filed for this visit.  Physical Exam Constitutional:      Appearance: She is well-developed. She is not ill-appearing.  Cardiovascular:     Rate and Rhythm: Normal rate and regular rhythm.  Pulmonary:     Effort: Pulmonary effort is normal.  Abdominal:     General: There is no distension.     Palpations: Abdomen is soft.     Tenderness: There is no abdominal tenderness.  Musculoskeletal:        General: No deformity.     Cervical back: Normal range of motion and neck supple.  Lymphadenopathy:     Cervical: No cervical adenopathy.     Upper Body:     Right upper body: No supraclavicular or axillary adenopathy.     Left upper body: No supraclavicular or axillary adenopathy.     Lower Body: No right inguinal adenopathy. No left inguinal adenopathy.  Skin:    General: Skin is warm.     Coloration: Skin is not pale.  Neurological:     Mental Status: She is alert and oriented to person, place, and time.  Psychiatric:        Mood and Affect: Mood normal.        Behavior: Behavior normal.         Latest Ref Rng & Units 10/22/2022    1:01 PM  CMP  Glucose 70 - 99 mg/dL 100   BUN 8 - 23 mg/dL 10   Creatinine 0.44 - 1.00 mg/dL 0.65   Sodium 135 - 145 mmol/L 140   Potassium 3.5 - 5.1 mmol/L 4.5   Chloride 98 - 111 mmol/L 104   CO2 22 - 32 mmol/L 29   Calcium 8.9 - 10.3 mg/dL 9.4   Total Protein 6.5 - 8.1 g/dL 7.3   Total Bilirubin 0.3 - 1.2 mg/dL 0.8   Alkaline Phos 38 - 126 U/L 88   AST 15 - 41 U/L 22   ALT 0 - 44 U/L 24       Latest Ref Rng & Units 10/22/2022    1:01 PM  CBC  WBC 4.0 - 10.5 K/uL 6.1   Hemoglobin 12.0 - 15.0 g/dL 14.2   Hematocrit 36.0 -  46.0 % 42.8   Platelets 150 - 400 K/uL 174      Assessment  and plan- Patient is a 66 y.o. female  with   History of stage I diffuse large B-cell lymphoma - s/p 3 cycles of R-CHOP chemotherapy completed 02/27/2018 and IMRT, completed May 2019, currently in CR 1. Returns to surveillance.  Labs reviewed and are overall unremarkable.  Clinically, she is asymptomatic.  No lymphadenopathy on exam.  Plan to continue surveillance for up to 5 years from completion of treatment.  Disposition:  6 mo- lab (cbc, cmp), Dr Janese Banks   Visit Diagnosis 1. Encounter for follow-up surveillance of diffuse large B-cell lymphoma    Beckey Rutter, DNP, AGNP-C [place] at [facility] [phone] (clinic) 10/22/2022

## 2022-10-22 NOTE — Telephone Encounter (Signed)
Patient advised to call [facility] to schedule appt.

## 2022-10-28 ENCOUNTER — Encounter (INDEPENDENT_AMBULATORY_CARE_PROVIDER_SITE_OTHER): Payer: Self-pay

## 2022-11-04 ENCOUNTER — Ambulatory Visit (INDEPENDENT_AMBULATORY_CARE_PROVIDER_SITE_OTHER): Payer: Managed Care, Other (non HMO) | Admitting: Family Medicine

## 2022-11-04 ENCOUNTER — Encounter: Payer: Self-pay | Admitting: Family Medicine

## 2022-11-04 VITALS — BP 116/72 | HR 82 | Temp 97.6°F | Resp 16 | Ht 62.0 in | Wt 177.8 lb

## 2022-11-04 DIAGNOSIS — Z23 Encounter for immunization: Secondary | ICD-10-CM | POA: Diagnosis not present

## 2022-11-04 DIAGNOSIS — E782 Mixed hyperlipidemia: Secondary | ICD-10-CM

## 2022-11-04 DIAGNOSIS — Z6832 Body mass index (BMI) 32.0-32.9, adult: Secondary | ICD-10-CM

## 2022-11-04 DIAGNOSIS — E038 Other specified hypothyroidism: Secondary | ICD-10-CM | POA: Diagnosis not present

## 2022-11-04 DIAGNOSIS — E669 Obesity, unspecified: Secondary | ICD-10-CM

## 2022-11-04 DIAGNOSIS — R739 Hyperglycemia, unspecified: Secondary | ICD-10-CM | POA: Diagnosis not present

## 2022-11-04 DIAGNOSIS — E559 Vitamin D deficiency, unspecified: Secondary | ICD-10-CM

## 2022-11-04 DIAGNOSIS — C8331 Diffuse large B-cell lymphoma, lymph nodes of head, face, and neck: Secondary | ICD-10-CM | POA: Diagnosis not present

## 2022-11-04 DIAGNOSIS — R195 Other fecal abnormalities: Secondary | ICD-10-CM

## 2022-11-04 DIAGNOSIS — Z124 Encounter for screening for malignant neoplasm of cervix: Secondary | ICD-10-CM | POA: Diagnosis not present

## 2022-11-04 DIAGNOSIS — Z0000 Encounter for general adult medical examination without abnormal findings: Secondary | ICD-10-CM | POA: Diagnosis not present

## 2022-11-04 NOTE — Progress Notes (Signed)
I,Sulibeya S Dimas,acting as a Education administrator for Lavon Paganini, MD.,have documented all relevant documentation on the behalf of Lavon Paganini, MD,as directed by  Lavon Paganini, MD while in the presence of Lavon Paganini, MD.    Complete physical exam   Patient: Victoria Benson   DOB: April 05, 1956   66 y.o. Female  MRN: [id number] Visit Date: 11/04/2022  Today's healthcare provider: Lavon Paganini, MD   Chief Complaint  Patient presents with   Annual Exam   Subjective    Victoria Benson is a 66 y.o. female who presents today for a complete physical exam.  She reports consuming a general diet. The patient does not participate in regular exercise at present. She generally feels well. She reports sleeping well. She does not have additional problems to discuss today.  HPI   Decreased stool caliber for a few months. Pencil-like. Seems to float more than usual.  Past Medical History:  Diagnosis Date   BRCA gene mutation negative 07/10/2016   MyRisk neg   Breast cancer ([place]) 2009   T1c (1.2 cm) N0 ER 90%, PR 90%, HER-2/neu not amplified. Oncotype DX score, low risk (14) 9% chance of recurrence with antiestrogen therapy alone.   Breast cancer ([place]) 06/17/2016   INVASIVE MAMMARY CARCINOMA . T1, ER positive, PR positive, HER-2/neu not overexpressed   Diffuse large B-cell lymphoma ([place]) 11/2017   Right cervical adenopathy   Family history of adverse reaction to anesthesia    PTS MOM-HARD TO WAKE UP   Heart murmur    History of basal cell carcinoma 02/11/2011   right upper back   History of dysplastic nevus 06/20/2011   left scapula   Osteoarthritis    left knee   Personal history of chemotherapy 2019   lymphoma   Personal history of radiation therapy 2009   right breast cancer   Personal history of radiation therapy 2017   right breast cancer   PONV (postoperative nausea and vomiting)    Radiation 2009   BREAST CA   Skin cancer    Past Surgical History:  Procedure  Laterality Date   APPENDECTOMY     BREAST BIOPSY Right 06/17/2016   invasive. T1, ER positive, PR positive, HER-2/neu not overexpressed   BREAST CYST ASPIRATION Left 2002   BREAST EXCISIONAL BIOPSY Right 06/2016   lumpectomy with rad    BREAST EXCISIONAL BIOPSY Right 2009   breast ca + mammo site radiation   BREAST LUMPECTOMY Right 2017   invasive mammary NEG margins   BREAST LUMPECTOMY Right 2009   mammosite   BREAST LUMPECTOMY WITH NEEDLE LOCALIZATION Right 07/18/2016   Procedure: BREAST LUMPECTOMY WITH NEEDLE LOCALIZATION AND RIGHT AXILLARY EXPLORATION;  Surgeon: Robert Bellow, MD;  Location: [facility] OR[facility];  Service: General;  Laterality: Right;   BREAST SURGERY Right July 2009   Wide excision, sentinel left node biopsy MammoSite. ( 06/21/2008   CHOLECYSTECTOMY  1984   COLONOSCOPY WITH PROPOFOL N/A 10/13/2018   Procedure: COLONOSCOPY WITH PROPOFOL;  Surgeon: Lucilla Lame, MD;  Location: [facility];  Service: Endoscopy;  Laterality: N/A;   DILATION AND CURETTAGE OF UTERUS  2004   ENDOMETRIAL ABLATION  2005   IR FLUORO GUIDE PORT INSERTION RIGHT  01/15/2018   KNEE ARTHROSCOPY Left 03/26/2022   Procedure: ARTHROSCOPY KNEE;  Surgeon: Thornton Park, MD;  Location: [facility];  Service: Orthopedics;  Laterality: Left;   KNEE CLOSED REDUCTION Left 03/26/2022   Procedure: CLOSED MANIPULATION KNEE;  Surgeon: Thornton Park, MD;  Location: [facility];  Service: Orthopedics;  Laterality: Left;   TOTAL KNEE ARTHROPLASTY Left 11/13/2021   Procedure: LEFT TOTAL KNEE ARTHROPLASTY;  Surgeon: Thornton Park, MD;  Location: [facility];  Service: Orthopedics;  Laterality: Left;   TUMOR REMOVAL  12/2013   from uterus done by [facility]   Social History   Socioeconomic History   Marital status: Married    Spouse name: [name]   Number of children: 2   Years of education: 12   Highest education level: Not on file  Occupational History   Occupation: unemployed  Tobacco Use   Smoking status: Never    Smokeless tobacco: Never  Vaping Use   Vaping Use: Never used  Substance and Sexual Activity   Alcohol use: No   Drug use: No   Sexual activity: Yes    Birth control/protection: Post-menopausal  Other Topics Concern   Not on file  Social History Narrative   Not on file   Social Determinants of Health   Financial Resource Strain: Not on file  Food Insecurity: Not on file  Transportation Needs: Not on file  Physical Activity: Not on file  Stress: Not on file  Social Connections: Not on file  Intimate Partner Violence: Not on file   Family Status  Relation Name Status   Mother  Alive   Father  Deceased       CA   Brother 1 Alive   MGM  Deceased at age 60       CA   [place]  Deceased   PGF  Deceased   Brother 2 Alive   Brother 3 Alive   Brother 4 Alive   MGF  Deceased at age 79       old age   Other Maternal Great Grandmoth (Not Specified)   Neg Hx  (Not Specified)   Family History  Problem Relation Age of Onset   CAD Mother    Osteoporosis Mother    Lung cancer Father    Diabetes Father    Hypertension Brother    Ovarian cancer Maternal Grandmother    Heart attack Paternal Grandmother    Diabetes Paternal Grandfather    Fibromyalgia Brother    Hypertension Brother    Hypertension Brother    Sleep apnea Brother    Healthy Brother    Sleep apnea Brother    Ovarian cancer Other    Breast cancer Neg Hx    No Known Allergies  Patient Care Team: Leeona Mccardle, Dionne Bucy, MD as PCP - General (Family Medicine) Margarita Rana, MD as Referring Physician (Family Medicine) Bary Castilla, Forest Gleason, MD (General Surgery) Sindy Guadeloupe, MD as Consulting Physician (Oncology) Noreene Filbert, MD as Referring Physician (Radiation Oncology)   Medications: Outpatient Medications Prior to Visit  Medication Sig   Calcium Carb-Cholecalciferol (CALCIUM 600/VITAMIN D PO) Take 1 tablet by mouth daily.   cetirizine (ZYRTEC) 10 MG tablet Take 10 mg by mouth daily.   fluticasone  (FLONASE) 50 MCG/ACT nasal spray USE 2 SPRAYS INTO BOTH NOSTRILS DAILY   Multiple Vitamin (MULTIVITAMIN) capsule Take 1 capsule by mouth daily.   OMEGA-3 KRILL OIL PO Take 600 mg by mouth daily.   No facility-administered medications prior to visit.    Review of Systems  HENT:  Positive for nosebleeds and tinnitus.   Gastrointestinal:  Positive for diarrhea.  All other systems reviewed and are negative.   Last CBC Lab Results  Component Value Date   WBC 6.1 10/22/2022   HGB 14.2 10/22/2022   HCT  42.8 10/22/2022   MCV 88.8 10/22/2022   MCH 29.5 10/22/2022   RDW 12.7 10/22/2022   PLT 174 48/27/0786   Last metabolic panel Lab Results  Component Value Date   GLUCOSE 100 (H) 10/22/2022   NA 140 10/22/2022   K 4.5 10/22/2022   CL 104 10/22/2022   CO2 29 10/22/2022   BUN 10 10/22/2022   CREATININE 0.65 10/22/2022   GFRNONAA >60 10/22/2022   CALCIUM 9.4 10/22/2022   PHOS 2.7 01/08/2018   PROT 7.3 10/22/2022   ALBUMIN 4.2 10/22/2022   LABGLOB 2.0 08/16/2021   AGRATIO 2.2 08/16/2021   BILITOT 0.8 10/22/2022   ALKPHOS 88 10/22/2022   AST 22 10/22/2022   ALT 24 10/22/2022   ANIONGAP 7 10/22/2022   Last lipids Lab Results  Component Value Date   CHOL 215 (H) 09/29/2020   HDL 49 09/29/2020   LDLCALC 128 (H) 09/29/2020   TRIG 217 (H) 09/29/2020   CHOLHDL 3.9 09/28/2019   Last hemoglobin A1c Lab Results  Component Value Date   HGBA1C 5.1 08/16/2021   Last thyroid functions Lab Results  Component Value Date   TSH 2.184 11/13/2021   Last vitamin D Lab Results  Component Value Date   VD25OH 42.5 09/29/2020   Last vitamin B12 and Folate Lab Results  Component Value Date   VITAMINB12 253 11/13/2021      Objective    BP 116/72 (BP Location: Left Arm, Patient Position: Sitting, Cuff Size: Large)   Pulse 82   Temp 97.6 F (36.4 C) (Oral)   Resp 16   Ht _0  (1.575 m)   Wt 177 lb 12.8 oz (80.6 kg)   BMI 32.52 kg/m  BP Readings from Last 3 Encounters:   11/04/22 116/72  04/22/22 119/68  03/26/22 (!) 98/52   Wt Readings from Last 3 Encounters:  11/04/22 177 lb 12.8 oz (80.6 kg)  04/22/22 165 lb 6.4 oz (75 kg)  03/15/22 167 lb (75.8 kg)      Physical Exam Vitals reviewed.  Constitutional:      General: She is not in acute distress.    Appearance: Normal appearance. She is well-developed. She is not diaphoretic.  HENT:     Head: Normocephalic and atraumatic.     Right Ear: Tympanic membrane, ear canal and external ear normal.     Left Ear: Tympanic membrane, ear canal and external ear normal.     Nose: Nose normal.     Mouth/Throat:     Mouth: Mucous membranes are moist.     Pharynx: Oropharynx is clear. No oropharyngeal exudate.  Eyes:     General: No scleral icterus.    Conjunctiva/sclera: Conjunctivae normal.     Pupils: Pupils are equal, round, and reactive to light.  Neck:     Thyroid: No thyromegaly.  Cardiovascular:     Rate and Rhythm: Normal rate and regular rhythm.     Pulses: Normal pulses.     Heart sounds: Normal heart sounds. No murmur heard. Pulmonary:     Effort: Pulmonary effort is normal. No respiratory distress.     Breath sounds: Normal breath sounds. No wheezing or rales.  Abdominal:     General: There is no distension.     Palpations: Abdomen is soft.     Tenderness: There is no abdominal tenderness.  Genitourinary:    Comments: GYN:  External genitalia within normal limits.  Vaginal mucosa pink, moist, normal rugae.  Nonfriable cervix without lesions, no discharge or bleeding  noted on speculum exam.   Musculoskeletal:        General: No deformity.     Cervical back: Neck supple.     Right lower leg: No edema.     Left lower leg: No edema.  Lymphadenopathy:     Cervical: No cervical adenopathy.  Skin:    General: Skin is warm and dry.     Findings: No rash.  Neurological:     Mental Status: She is alert and oriented to person, place, and time. Mental status is at baseline.     Sensory: No  sensory deficit.     Motor: No weakness.     Gait: Gait normal.  Psychiatric:        Mood and Affect: Mood normal.        Behavior: Behavior normal.        Thought Content: Thought content normal.       Last depression screening scores    11/04/2022    2:41 PM 10/23/2021    9:04 AM 05/15/2021    1:14 PM  PHQ 2/9 Scores  PHQ - 2 Score 1 0 1  PHQ- 9 Score 2 0 3   Last fall risk screening    11/04/2022    2:41 PM  [place] in the past year? 1  Number falls in past yr: 0  Injury with Fall? 0  Risk for fall due to : No Fall Risks  Follow up Falls evaluation completed   Last Audit-C alcohol use screening    11/04/2022    2:41 PM  Alcohol Use Disorder Test (AUDIT)  1. How often do you have a drink containing alcohol? 1  2. How many drinks containing alcohol do you have on a typical day when you are drinking? 0  3. How often do you have six or more drinks on one occasion? 0  AUDIT-C Score 1   A score of 3 or more in women, and 4 or more in men indicates increased risk for alcohol abuse, EXCEPT if all of the points are from question 1   No results found for any visits on 11/04/22.  Assessment & Plan    Routine Health Maintenance and Physical Exam  Exercise Activities and Dietary recommendations  Goals   None     Immunization History  Administered Date(s) Administered   Hepatitis A 07/20/2003   Influenza Split 09/10/2011, 12/11/2012   Influenza,inj,Quad PF,6+ Mos 09/25/2018, 09/28/2019, 09/29/2020, 10/15/2021   Influenza,inj,quad, With Preservative 12/19/2017   Influenza-Unspecified 12/04/2015, 10/21/2022   Moderna Sars-Covid-2 Vaccination 03/02/2020, 03/30/2020, 11/14/2020   PNEUMOCOCCAL CONJUGATE-20 11/04/2022   Td 07/10/1999, 09/28/2019   Tdap 04/12/2009   Zoster Recombinat (Shingrix) 09/28/2019, 11/30/2019    Health Maintenance  Topic Date Due   COVID-19 Vaccine (4 - Moderna risk series) 01/09/2021   PAP SMEAR-Modifier  03/21/2022   MAMMOGRAM   10/27/2023   COLONOSCOPY (Pts 45-2yr Insurance coverage will need to be confirmed)  10/13/2028   TETANUS/TDAP  09/27/2029   Pneumonia Vaccine 65 Years old  Completed   INFLUENZA VACCINE  Completed   DEXA SCAN  Completed   Hepatitis C Screening  Completed   HIV Screening  Completed   Zoster Vaccines- Shingrix  Completed   HPV VACCINES  Aged Out    Discussed health benefits of physical activity, and encouraged her to engage in regular exercise appropriate for her age and condition.  Problem List Items Addressed This Visit       Endocrine  Subclinical hypothyroidism    Asymptomatic and not on medications Was well-controlled on last check Continue to monitor TSH and free T4      Relevant Orders   TSH + free T4     Other   HLD (hyperlipidemia)    Reviewed last lipid panel Not currently on a statin Recheck FLP and CMP Discussed diet and exercise       Relevant Orders   Lipid Panel With LDL/HDL Ratio   Avitaminosis D    Continue supplement Recheck level       Relevant Orders   VITAMIN D 25 Hydroxy (Vit-D Deficiency, Fractures)   DLBCL (diffuse large B cell lymphoma) ([place])    F/b Oncology Doing well      Obesity    Discussed importance of healthy weight management Discussed diet and exercise       Decreased stool caliber   Relevant Orders   Ambulatory referral to Gastroenterology   Other Visit Diagnoses     Encounter for annual physical exam    -  Primary   Relevant Orders   Lipid Panel With LDL/HDL Ratio   Pneumococcal conjugate vaccine 20-valent (Completed)   Cytology - PAP   TSH + free T4   VITAMIN D 25 Hydroxy (Vit-D Deficiency, Fractures)   Cervical cancer screening       Relevant Orders   Cytology - PAP   Need for pneumococcal 20-valent conjugate vaccination       Relevant Orders   Pneumococcal conjugate vaccine 20-valent (Completed)   Hyperglycemia       Relevant Orders   Hemoglobin A1c        Return in about 1 year (around 11/05/2023)  for CPE.     I, Lavon Paganini, MD, have reviewed all documentation for this visit. The documentation on 11/04/22 for the exam, diagnosis, procedures, and orders are all accurate and complete.   Izeah Vossler, Dionne Bucy, MD, MPH [facility]

## 2022-11-04 NOTE — Assessment & Plan Note (Signed)
Reviewed last lipid panel Not currently on a statin Recheck FLP and CMP Discussed diet and exercise  

## 2022-11-04 NOTE — Assessment & Plan Note (Signed)
Discussed importance of healthy weight management Discussed diet and exercise  

## 2022-11-04 NOTE — Assessment & Plan Note (Signed)
Continue supplement Recheck level 

## 2022-11-04 NOTE — Assessment & Plan Note (Signed)
New problem x few months No blood in stool, weight loss, B symptoms UTD on screening colonoscopy, but this new and consistent symptoms warrants evaluation  Referral ot GI placed today

## 2022-11-04 NOTE — Assessment & Plan Note (Signed)
F/b Oncology Doing well

## 2022-11-04 NOTE — Assessment & Plan Note (Signed)
Asymptomatic and not on medications Was well-controlled on last check Continue to monitor TSH and free T4

## 2022-11-08 LAB — CYTOLOGY - PAP
Adequacy: ABNORMAL
Comment: NEGATIVE

## 2022-11-27 ENCOUNTER — Ambulatory Visit
Admission: RE | Admit: 2022-11-27 | Discharge: 2022-11-27 | Disposition: A | Discharge status: Home / Self Care | Payer: Managed Care, Other (non HMO) | Source: Ambulatory Visit | Attending: Family Medicine | Admitting: Family Medicine

## 2022-11-27 DIAGNOSIS — Z170 Estrogen receptor positive status [ER+]: Secondary | ICD-10-CM | POA: Insufficient documentation

## 2022-11-27 DIAGNOSIS — Z1231 Encounter for screening mammogram for malignant neoplasm of breast: Secondary | ICD-10-CM | POA: Diagnosis not present

## 2022-11-27 DIAGNOSIS — C50211 Malignant neoplasm of upper-inner quadrant of right female breast: Secondary | ICD-10-CM | POA: Diagnosis present

## 2022-11-28 LAB — LIPID PANEL WITH LDL/HDL RATIO
Cholesterol, Total: 206 mg/dL — ABNORMAL HIGH (ref 100–199)
HDL: 62 mg/dL (ref >39)
LDL Chol Calc (NIH): 118 mg/dL — ABNORMAL HIGH (ref 0–99)
LDL/HDL Ratio: 1.9 ratio (ref 0.0–3.2)
Triglycerides: 147 mg/dL (ref 0–149)
VLDL Cholesterol Cal: 26 mg/dL (ref 5–40)

## 2022-11-28 LAB — TSH+FREE T4
Free T4: 1.09 ng/dL (ref 0.82–1.77)
TSH: 7.09 u[IU]/mL — ABNORMAL HIGH (ref 0.450–4.500)

## 2022-11-28 LAB — VITAMIN D 25 HYDROXY (VIT D DEFICIENCY, FRACTURES): Vit D, 25-Hydroxy: 29 ng/mL — ABNORMAL LOW (ref 30.0–100.0)

## 2022-11-28 LAB — HEMOGLOBIN A1C
Est. average glucose Bld gHb Est-mCnc: 103 mg/dL
Hgb A1c MFr Bld: 5.2 % (ref 4.8–5.6)

## 2022-11-29 NOTE — Progress Notes (Signed)
Please, let pt know that her cholesterol levels and A1C level improved but her Vit D level was 29, recommended OTC vit D up to 1,000-2,000IU daily.  Her TSH level increased but T4 was the same. Suggested if she is symptomatic , start medication if not, monitor her TSH level

## 2022-11-29 NOTE — Progress Notes (Unsigned)
I,Joseline E Rosas,acting as a scribe for Lavon Paganini, MD.,have documented all relevant documentation on the behalf of Lavon Paganini, MD,as directed by  Lavon Paganini, MD while in the presence of Lavon Paganini, MD.   Established patient visit   Patient: Victoria Benson   DOB: Nov 09, 1956   66 y.o. Female  MRN: [id number] Visit Date: 12/02/2022  Today's healthcare provider: Lavon Paganini, MD   Chief Complaint  Patient presents with   Pap Only   Subjective    HPI  Follow up for repeat Pap  The patient was last seen for this 1 months ago. Changes made at last visit include no change.  Number of cells that was collected on the Pap is too small for processing.  HPV testing is negative, but it is recommended to recollect the Pap smear.   -----------------------------------------------------------------------------------------   Medications: Outpatient Medications Prior to Visit  Medication Sig   Calcium Carb-Cholecalciferol (CALCIUM 600/VITAMIN D PO) Take 1 tablet by mouth daily.   cetirizine (ZYRTEC) 10 MG tablet Take 10 mg by mouth daily.   fluticasone (FLONASE) 50 MCG/ACT nasal spray USE 2 SPRAYS INTO BOTH NOSTRILS DAILY   Multiple Vitamin (MULTIVITAMIN) capsule Take 1 capsule by mouth daily.   OMEGA-3 KRILL OIL PO Take 600 mg by mouth daily.   No facility-administered medications prior to visit.    Review of Systems per HPI     Objective    BP 97/61 (BP Location: Left Arm, Patient Position: Sitting, Cuff Size: Normal)   Pulse 87   Temp 98.6 F (37 C) (Oral)   Resp 16   Ht '5\' 2"'$  (1.575 m)   Wt 177 lb 6.4 oz (80.5 kg)   BMI 32.45 kg/m    Physical Exam Vitals reviewed.  Constitutional:      General: She is not in acute distress.    Appearance: She is well-developed.  HENT:     Head: Normocephalic and atraumatic.  Eyes:     General: No scleral icterus.    Conjunctiva/sclera: Conjunctivae normal.  Cardiovascular:     Rate and Rhythm:  Normal rate and regular rhythm.  Pulmonary:     Effort: Pulmonary effort is normal. No respiratory distress.  Genitourinary:    Comments: GYN:  External genitalia within normal limits.  Vaginal mucosa pink, moist, normal rugae.  Nonfriable cervix without lesions, no discharge or bleeding noted on speculum exam.   Skin:    General: Skin is warm and dry.     Findings: No rash.  Neurological:     Mental Status: She is alert and oriented to person, place, and time.  Psychiatric:        Behavior: Behavior normal.       No results found for any visits on 12/02/22.  Assessment & Plan     Problem List Items Addressed This Visit       Endocrine   Subclinical hypothyroidism    Asymptomatic No need for meds at this time Is going to f/u with ENT about her thyroid nodule May be burning out due to h/o radiation Will recheck TSH and free T4 in 6 months      Relevant Orders   TSH + free T4   Other Visit Diagnoses     Cervical cancer screening    -  Primary   Relevant Orders   Cytology - PAP      Separate pap spatula and endocevical brush used instead of broom  Return in about 1 year (  around 12/03/2023) for CPE, as scheduled.      I, Lavon Paganini, MD, have reviewed all documentation for this visit. The documentation on 12/02/22 for the exam, diagnosis, procedures, and orders are all accurate and complete.   Bacigalupo, Dionne Bucy, MD, MPH [facility]

## 2022-12-02 ENCOUNTER — Ambulatory Visit (INDEPENDENT_AMBULATORY_CARE_PROVIDER_SITE_OTHER): Payer: Managed Care, Other (non HMO) | Admitting: Family Medicine

## 2022-12-02 ENCOUNTER — Encounter: Payer: Self-pay | Admitting: Family Medicine

## 2022-12-02 VITALS — BP 97/61 | HR 87 | Temp 98.6°F | Resp 16 | Ht 62.0 in | Wt 177.4 lb

## 2022-12-02 DIAGNOSIS — Z124 Encounter for screening for malignant neoplasm of cervix: Secondary | ICD-10-CM | POA: Diagnosis not present

## 2022-12-02 DIAGNOSIS — E038 Other specified hypothyroidism: Secondary | ICD-10-CM | POA: Diagnosis not present

## 2022-12-02 NOTE — Assessment & Plan Note (Signed)
Asymptomatic No need for meds at this time Is going to f/u with ENT about her thyroid nodule May be burning out due to h/o radiation Will recheck TSH and free T4 in 6 months

## 2022-12-09 LAB — CYTOLOGY - PAP: Diagnosis: NEGATIVE

## 2023-02-13 ENCOUNTER — Telehealth: Payer: Managed Care, Other (non HMO) | Admitting: Gastroenterology

## 2023-04-22 ENCOUNTER — Other Ambulatory Visit: Payer: Self-pay | Admitting: *Deleted

## 2023-04-22 DIAGNOSIS — C8331 Diffuse large B-cell lymphoma, lymph nodes of head, face, and neck: Secondary | ICD-10-CM

## 2023-04-23 ENCOUNTER — Inpatient Hospital Stay (HOSPITAL_BASED_OUTPATIENT_CLINIC_OR_DEPARTMENT_OTHER): Payer: 59 | Admitting: Oncology

## 2023-04-23 ENCOUNTER — Inpatient Hospital Stay: Payer: 59 | Attending: Oncology

## 2023-04-23 ENCOUNTER — Encounter: Payer: Self-pay | Admitting: Oncology

## 2023-04-23 VITALS — BP 113/71 | HR 76 | Temp 98.2°F | Resp 18 | Ht 62.0 in | Wt 192.0 lb

## 2023-04-23 DIAGNOSIS — Z8572 Personal history of non-Hodgkin lymphomas: Secondary | ICD-10-CM | POA: Insufficient documentation

## 2023-04-23 DIAGNOSIS — C8331 Diffuse large B-cell lymphoma, lymph nodes of head, face, and neck: Secondary | ICD-10-CM

## 2023-04-23 DIAGNOSIS — Z853 Personal history of malignant neoplasm of breast: Secondary | ICD-10-CM | POA: Insufficient documentation

## 2023-04-23 DIAGNOSIS — Z9221 Personal history of antineoplastic chemotherapy: Secondary | ICD-10-CM | POA: Diagnosis not present

## 2023-04-23 DIAGNOSIS — Z923 Personal history of irradiation: Secondary | ICD-10-CM | POA: Diagnosis not present

## 2023-04-23 DIAGNOSIS — Z8579 Personal history of other malignant neoplasms of lymphoid, hematopoietic and related tissues: Secondary | ICD-10-CM

## 2023-04-23 DIAGNOSIS — Z08 Encounter for follow-up examination after completed treatment for malignant neoplasm: Secondary | ICD-10-CM

## 2023-04-23 LAB — COMPREHENSIVE METABOLIC PANEL
ALT: 20 U/L (ref 0–44)
AST: 18 U/L (ref 15–41)
Albumin: 4.2 g/dL (ref 3.5–5.0)
Alkaline Phosphatase: 93 U/L (ref 38–126)
Anion gap: 9 (ref 5–15)
BUN: 9 mg/dL (ref 8–23)
CO2: 29 mmol/L (ref 22–32)
Calcium: 9.5 mg/dL (ref 8.9–10.3)
Chloride: 100 mmol/L (ref 98–111)
Creatinine, Ser: 0.63 mg/dL (ref 0.44–1.00)
GFR, Estimated: >60 mL/min (ref >60)
Glucose, Bld: 101 mg/dL — ABNORMAL HIGH (ref 70–99)
Potassium: 4.2 mmol/L (ref 3.5–5.1)
Sodium: 138 mmol/L (ref 135–145)
Total Bilirubin: 0.6 mg/dL (ref 0.3–1.2)
Total Protein: 7.2 g/dL (ref 6.5–8.1)

## 2023-04-23 LAB — CBC WITH DIFFERENTIAL/PLATELET
Abs Immature Granulocytes: 0.02 10*3/uL (ref 0.00–0.07)
Basophils Absolute: 0 10*3/uL (ref 0.0–0.1)
Basophils Relative: 1 %
Eosinophils Absolute: 0.1 10*3/uL (ref 0.0–0.5)
Eosinophils Relative: 2 %
HCT: 42.8 % (ref 36.0–46.0)
Hemoglobin: 14.3 g/dL (ref 12.0–15.0)
Immature Granulocytes: 0 %
Lymphocytes Relative: 25 %
Lymphs Abs: 1.7 10*3/uL (ref 0.7–4.0)
MCH: 29.8 pg (ref 26.0–34.0)
MCHC: 33.4 g/dL (ref 30.0–36.0)
MCV: 89.2 fL (ref 80.0–100.0)
Monocytes Absolute: 0.6 10*3/uL (ref 0.1–1.0)
Monocytes Relative: 9 %
Neutro Abs: 4.2 10*3/uL (ref 1.7–7.7)
Neutrophils Relative %: 63 %
Platelets: 205 10*3/uL (ref 150–400)
RBC: 4.8 MIL/uL (ref 3.87–5.11)
RDW: 12.8 % (ref 11.5–15.5)
WBC: 6.7 10*3/uL (ref 4.0–10.5)
nRBC: 0 % (ref 0.0–0.2)

## 2023-04-23 NOTE — Progress Notes (Signed)
No concerns. 

## 2023-04-23 NOTE — Progress Notes (Signed)
Hematology/Oncology Consult note [facility]  [phone] [phone]  Patient Care Team: Erasmo Downer, MD as PCP - General (Family Medicine) Lorie Phenix, MD as Referring Physician (Family Medicine) Lemar Livings, Merrily Pew, MD (General Surgery) Creig Hines, MD as Consulting Physician (Oncology) Carmina Miller, MD as Referring Physician (Radiation Oncology)   Name of the patient: Victoria Benson  [id number]  12-Oct-1956   Date of visit: 04/23/23  Diagnosis- stage I diffuse large B-cell lymphoma in CR 1   Chief complaint/ Reason for visit-routine follow-up of diffuse large B-cell lymphoma  Heme/Onc history: patient is a 67 year old female who developed symptoms of upper respiratory infection and nasal congestion sometime in November 2018.  Around the same time she also developed a right neck mass which was initially attributed to possible tonsillitis and she was given antibiotics for the same.  However her right neck mass did not decrease in size and she eventually saw Dr. Andee Poles from ENT.  Patient was found to have significant right cervical adenopathy and underwent CT of the soft tissue neck which showed right level 2 and 3 adenopathy favoring metastatic carcinoma.  Jugulodigastric node measuring up to 3.7 cm.  Asymmetric fullness of the right tonsillar fossa possibly primary site.    Patient underwent core biopsy of the right cervical lymph node. Pathology showed diffuse large B-cell lymphoma of the ABC subtype.  IHC was positive for BCL-2 and BCL 6 see Mick IHC was not tested due to insufficiency of the testing sample.  Ki-67 was high 80-90%.   PET/CT scan showed hypermetabolism in the tonsillar fossa bilaterally right greater than left as well as hypermetabolic lymphadenopathy in the right neck at the level 2 and level 3.  No evidence of contralateral cervical adenopathy and no evidence of metastatic disease elsewhere   Bone marrow  biopsy showed no evidence of lymphoma. IPI score 0- low risk. Does not meet criteria for CNS prophylaxis   Patient finished 3 cycles of RCHOP on 02/27/18.PET showed no active disease. She then completed IFRT and is in CR1  Interval history-patient has been following up with ENT with regards to her thyroid nodule.  She was also started on low-dose levothyroxine 50 mcg given her abnormal thyroid functions as well as weight gain and changes in her bowel habits.  She denies other complaints at this time ECOG PS- 0 Pain scale- 0   Review of systems- Review of Systems  Constitutional:  Negative for chills, fever, malaise/fatigue and weight loss.       Weight gain  HENT:  Negative for congestion, ear discharge and nosebleeds.   Eyes:  Negative for blurred vision.  Respiratory:  Negative for cough, hemoptysis, sputum production, shortness of breath and wheezing.   Cardiovascular:  Negative for chest pain, palpitations, orthopnea and claudication.  Gastrointestinal:  Negative for abdominal pain, blood in stool, constipation, diarrhea, heartburn, melena, nausea and vomiting.  Genitourinary:  Negative for dysuria, flank pain, frequency, hematuria and urgency.  Musculoskeletal:  Negative for back pain, joint pain and myalgias.  Skin:  Negative for rash.  Neurological:  Negative for dizziness, tingling, focal weakness, seizures, weakness and headaches.  Endo/Heme/Allergies:  Does not bruise/bleed easily.  Psychiatric/Behavioral:  Negative for depression and suicidal ideas. The patient does not have insomnia.       No Known Allergies   Past Medical History:  Diagnosis Date   BRCA gene mutation negative 07/10/2016   MyRisk neg   Breast cancer 2009   T1c (  1.2 cm) N0 ER 90%, PR 90%, HER-2/neu not amplified. Oncotype DX score, low risk (14) 9% chance of recurrence with antiestrogen therapy alone.   Breast cancer 06/17/2016   INVASIVE MAMMARY CARCINOMA . T1, ER positive, PR positive, HER-2/neu not  overexpressed   Diffuse large B-cell lymphoma 11/2017   Right cervical adenopathy   Family history of adverse reaction to anesthesia    PTS MOM-HARD TO WAKE UP   Heart murmur    History of basal cell carcinoma 02/11/2011   right upper back   History of dysplastic nevus 06/20/2011   left scapula   Osteoarthritis    left knee   Personal history of chemotherapy 2019   lymphoma   Personal history of radiation therapy 2009   right breast cancer   Personal history of radiation therapy 2017   right breast cancer   PONV (postoperative nausea and vomiting)    Radiation 2009   BREAST CA   Skin cancer      Past Surgical History:  Procedure Laterality Date   APPENDECTOMY     BREAST BIOPSY Right 06/17/2016   invasive. T1, ER positive, PR positive, HER-2/neu not overexpressed   BREAST CYST ASPIRATION Left 2002   BREAST EXCISIONAL BIOPSY Right 06/2016   lumpectomy with rad    BREAST EXCISIONAL BIOPSY Right 2009   breast ca + mammo site radiation   BREAST LUMPECTOMY Right 2017   invasive mammary NEG margins   BREAST LUMPECTOMY Right 2009   mammosite   BREAST LUMPECTOMY WITH NEEDLE LOCALIZATION Right 07/18/2016   Procedure: BREAST LUMPECTOMY WITH NEEDLE LOCALIZATION AND RIGHT AXILLARY EXPLORATION;  Surgeon: Earline Mayotte, MD;  Location: [facility];  Service: General;  Laterality: Right;   BREAST SURGERY Right July 2009   Wide excision, sentinel left node biopsy MammoSite. ( 06/21/2008   CHOLECYSTECTOMY  1984   COLONOSCOPY WITH PROPOFOL N/A 10/13/2018   Procedure: COLONOSCOPY WITH PROPOFOL;  Surgeon: Midge Minium, MD;  Location: [facility];  Service: Endoscopy;  Laterality: N/A;   DILATION AND CURETTAGE OF UTERUS  2004   ENDOMETRIAL ABLATION  2005   IR FLUORO GUIDE PORT INSERTION RIGHT  01/15/2018   KNEE ARTHROSCOPY Left 03/26/2022   Procedure: ARTHROSCOPY KNEE;  Surgeon: Juanell Fairly, MD;  Location: [facility];  Service: Orthopedics;  Laterality: Left;   KNEE CLOSED REDUCTION  Left 03/26/2022   Procedure: CLOSED MANIPULATION KNEE;  Surgeon: Juanell Fairly, MD;  Location: [facility];  Service: Orthopedics;  Laterality: Left;   TOTAL KNEE ARTHROPLASTY Left 11/13/2021   Procedure: LEFT TOTAL KNEE ARTHROPLASTY;  Surgeon: Juanell Fairly, MD;  Location: [facility];  Service: Orthopedics;  Laterality: Left;   TUMOR REMOVAL  12/2013   from uterus done by [facility]    Social History   Socioeconomic History   Marital status: Married    Spouse name: Loraine Leriche   Number of children: 2   Years of education: 12   Highest education level: Not on file  Occupational History   Occupation: unemployed  Tobacco Use   Smoking status: Never   Smokeless tobacco: Never  Vaping Use   Vaping Use: Never used  Substance and Sexual Activity   Alcohol use: No   Drug use: No   Sexual activity: Yes    Birth control/protection: Post-menopausal  Other Topics Concern   Not on file  Social History Narrative   Not on file   Social Determinants of Health   Financial Resource Strain: Not on file  Food Insecurity: Not on file  Transportation Needs: Not on file  Physical Activity: Not on file  Stress: Not on file  Social Connections: Not on file  Intimate Partner Violence: Not on file    Family History  Problem Relation Age of Onset   CAD Mother    Osteoporosis Mother    Lung cancer Father    Diabetes Father    Hypertension Brother    Ovarian cancer Maternal Grandmother    Heart attack Paternal Grandmother    Diabetes Paternal Grandfather    Fibromyalgia Brother    Hypertension Brother    Hypertension Brother    Sleep apnea Brother    Healthy Brother    Sleep apnea Brother    Ovarian cancer Other    Breast cancer Neg Hx      Current Outpatient Medications:    Calcium Carb-Cholecalciferol (CALCIUM 600/VITAMIN D PO), Take 1 tablet by mouth daily., Disp: , Rfl:    cetirizine (ZYRTEC) 10 MG tablet, Take 10 mg by mouth daily., Disp: , Rfl:    cyclobenzaprine (FLEXERIL) 10  MG tablet, Take 1 tablet by mouth 2 (two) times daily., Disp: , Rfl:    fluticasone (FLONASE) 50 MCG/ACT nasal spray, USE 2 SPRAYS INTO BOTH NOSTRILS DAILY, Disp: 16 g, Rfl: 5   levothyroxine (SYNTHROID) 50 MCG tablet, Take 50 mcg by mouth daily., Disp: , Rfl:    OMEGA-3 KRILL OIL PO, Take 600 mg by mouth daily., Disp: , Rfl:    Multiple Vitamin (MULTIVITAMIN) capsule, Take 1 capsule by mouth daily. (Patient not taking: Reported on 04/23/2023), Disp: , Rfl:   Physical exam:  Vitals:   04/23/23 1307  BP: 113/71  Pulse: 76  Resp: 18  Temp: 98.2 F (36.8 C)  TempSrc: Tympanic  SpO2: 100%  Weight: 192 lb (87.1 kg)  Height:  (1.575 m)   Physical Exam Cardiovascular:     Rate and Rhythm: Normal rate and regular rhythm.     Heart sounds: Normal heart sounds.  Pulmonary:     Effort: Pulmonary effort is normal.     Breath sounds: Normal breath sounds.  Abdominal:     General: Bowel sounds are normal.     Palpations: Abdomen is soft.  Lymphadenopathy:     Comments: No palpable cervical, supraclavicular, axillary or inguinal adenopathy    Skin:    General: Skin is warm and dry.  Neurological:     Mental Status: She is alert and oriented to person, place, and time.         Latest Ref Rng & Units 04/23/2023   12:54 PM  CMP  Glucose 70 - 99 mg/dL 161   BUN 8 - 23 mg/dL 9   Creatinine 0.96 - 0.45 mg/dL 4.09   Sodium 811 - 914 mmol/L 138   Potassium 3.5 - 5.1 mmol/L 4.2   Chloride 98 - 111 mmol/L 100   CO2 22 - 32 mmol/L 29   Calcium 8.9 - 10.3 mg/dL 9.5   Total Protein 6.5 - 8.1 g/dL 7.2   Total Bilirubin 0.3 - 1.2 mg/dL 0.6   Alkaline Phos 38 - 126 U/L 93   AST 15 - 41 U/L 18   ALT 0 - 44 U/L 20       Latest Ref Rng & Units 04/23/2023   12:54 PM  CBC  WBC 4.0 - 10.5 K/uL 6.7   Hemoglobin 12.0 - 15.0 g/dL 78.2   Hematocrit 95.6 - 46.0 % 42.8   Platelets 150 - 400 K/uL 205  No images are attached to the encounter.  No results found.   Assessment and plan-  Patient is a 67 y.o. female  with history of stage I diffuse large B-cell lymphoma s/p 3 cycles of R-CHOP chemotherapy and IMRT currently in CR 1.  This is a routine surveillance visit  Patient is now 5 years out of diagnosis of diffuse large B-cell lymphoma and completing her chemotherapy.  Clinically she is doing well with no concerning signs and symptoms of recurrence based on today's exam.  Labs are overall unremarkable.  She can continue to follow-up with Dr. Beryle Flock at this time and does not require any surveillance imaging either.  She can be referred to [place] in the future if questions or concerns arise   Visit Diagnosis 1. Encounter for follow-up surveillance of diffuse large B-cell lymphoma      Dr. Owens Shark, MD, MPH [facility] [phone] 04/23/2023 3:04 PM

## 2023-11-06 ENCOUNTER — Ambulatory Visit: Payer: 59 | Admitting: Family Medicine

## 2023-11-06 ENCOUNTER — Encounter: Payer: Self-pay | Admitting: Family Medicine

## 2023-11-06 VITALS — BP 116/53 | HR 83 | Ht 62.0 in | Wt 194.4 lb

## 2023-11-06 DIAGNOSIS — Z0000 Encounter for general adult medical examination without abnormal findings: Secondary | ICD-10-CM

## 2023-11-06 DIAGNOSIS — E038 Other specified hypothyroidism: Secondary | ICD-10-CM

## 2023-11-06 DIAGNOSIS — Z6835 Body mass index (BMI) 35.0-35.9, adult: Secondary | ICD-10-CM

## 2023-11-06 DIAGNOSIS — Z0001 Encounter for general adult medical examination with abnormal findings: Secondary | ICD-10-CM | POA: Diagnosis not present

## 2023-11-06 DIAGNOSIS — E66812 Obesity, class 2: Secondary | ICD-10-CM

## 2023-11-06 DIAGNOSIS — Z1231 Encounter for screening mammogram for malignant neoplasm of breast: Secondary | ICD-10-CM

## 2023-11-06 DIAGNOSIS — E782 Mixed hyperlipidemia: Secondary | ICD-10-CM

## 2023-11-06 DIAGNOSIS — R739 Hyperglycemia, unspecified: Secondary | ICD-10-CM

## 2023-11-06 DIAGNOSIS — E559 Vitamin D deficiency, unspecified: Secondary | ICD-10-CM

## 2023-11-06 DIAGNOSIS — Z23 Encounter for immunization: Secondary | ICD-10-CM | POA: Diagnosis not present

## 2023-11-06 NOTE — Assessment & Plan Note (Signed)
F/b ENT On synthroid 50 mcg daily - will continue Repeat TSH today

## 2023-11-06 NOTE — Progress Notes (Signed)
Complete physical exam  Patient: Victoria Benson   DOB: 1956-04-04   67 y.o. Female  MRN: [id number]  Subjective:    Chief Complaint  Patient presents with   Annual Exam    Patient reports consuming a general well balanced diet and participates in walking a little bit every day for at least 10- 15 minutes. She reports feeling well and sleeping well with no concerns to report. Patient last physical was 11/04/22.     Victoria Benson is a 67 y.o. female who presents today for a complete physical exam.      Most recent fall risk assessment:    11/06/2023    1:54 PM  Fall Risk   Falls in the past year? 0  Number falls in past yr: 0  Injury with Fall? 0  Risk for fall due to : No Fall Risks  Follow up Falls evaluation completed     Most recent depression screenings:    11/06/2023    1:54 PM 11/04/2022    2:41 PM  PHQ 2/9 Scores  PHQ - 2 Score 0 1  PHQ- 9 Score  2        Patient Care Team: Erasmo Downer, MD as PCP - General (Family Medicine) Lorie Phenix, MD as Referring Physician (Family Medicine) Lemar Livings, Merrily Pew, MD (General Surgery) Creig Hines, MD as Consulting Physician (Oncology) Carmina Miller, MD as Referring Physician (Radiation Oncology)   Outpatient Medications Prior to Visit  Medication Sig   Calcium Carb-Cholecalciferol (CALCIUM 600/VITAMIN D PO) Take 1 tablet by mouth daily.   cetirizine (ZYRTEC) 10 MG tablet Take 10 mg by mouth daily.   levothyroxine (SYNTHROID) 50 MCG tablet Take 50 mcg by mouth daily.   Multiple Vitamin (MULTIVITAMIN) capsule Take 1 capsule by mouth daily.   OMEGA-3 KRILL OIL PO Take 600 mg by mouth daily.   cyclobenzaprine (FLEXERIL) 10 MG tablet Take 1 tablet by mouth 2 (two) times daily.   fluticasone (FLONASE) 50 MCG/ACT nasal spray USE 2 SPRAYS INTO BOTH NOSTRILS DAILY   No facility-administered medications prior to visit.    ROS        Objective:     BP (!) 116/53 (BP Location: Left Arm, Patient  Position: Sitting, Cuff Size: Large)   Pulse 83   Ht 5\' 2"  (1.575 m)   Wt 194 lb 6.4 oz (88.2 kg)   SpO2 96%   BMI 35.56 kg/m    Physical Exam Vitals reviewed.  Constitutional:      General: She is not in acute distress.    Appearance: Normal appearance. She is well-developed. She is not diaphoretic.  HENT:     Head: Normocephalic and atraumatic.     Right Ear: Tympanic membrane, ear canal and external ear normal.     Left Ear: Tympanic membrane, ear canal and external ear normal.     Nose: Nose normal.     Mouth/Throat:     Mouth: Mucous membranes are moist.     Pharynx: Oropharynx is clear. No oropharyngeal exudate.  Eyes:     General: No scleral icterus.    Conjunctiva/sclera: Conjunctivae normal.     Pupils: Pupils are equal, round, and reactive to light.  Neck:     Thyroid: No thyromegaly.  Cardiovascular:     Rate and Rhythm: Normal rate and regular rhythm.     Heart sounds: Normal heart sounds. No murmur heard. Pulmonary:     Effort: Pulmonary effort is normal. No respiratory distress.  Breath sounds: Normal breath sounds. No wheezing or rales.  Abdominal:     General: There is no distension.     Palpations: Abdomen is soft.     Tenderness: There is no abdominal tenderness.  Musculoskeletal:        General: No deformity.     Cervical back: Neck supple.     Right lower leg: No edema.     Left lower leg: No edema.  Lymphadenopathy:     Cervical: No cervical adenopathy.  Skin:    General: Skin is warm and dry.     Findings: No rash.  Neurological:     Mental Status: She is alert and oriented to person, place, and time. Mental status is at baseline.     Gait: Gait normal.  Psychiatric:        Mood and Affect: Mood normal.        Behavior: Behavior normal.        Thought Content: Thought content normal.      No results found for any visits on 11/06/23.     Assessment & Plan:    Routine Health Maintenance and Physical Exam  Immunization History   Administered Date(s) Administered   Fluad Trivalent(High Dose 65+) 11/06/2023   Hepatitis A 07/20/2003   Influenza Split 09/10/2011, 12/11/2012   Influenza,inj,Quad PF,6+ Mos 09/25/2018, 09/28/2019, 09/29/2020, 10/15/2021   Influenza,inj,quad, With Preservative 12/19/2017   Influenza-Unspecified 12/04/2015, 10/21/2022   Moderna Sars-Covid-2 Vaccination 03/02/2020, 03/30/2020, 11/14/2020   PNEUMOCOCCAL CONJUGATE-20 11/04/2022   Td 07/10/1999, 09/28/2019   Tdap 04/12/2009   Zoster Recombinant(Shingrix) 09/28/2019, 11/30/2019    Health Maintenance  Topic Date Due   COVID-19 Vaccine (4 - 2023-24 season) 08/31/2023   MAMMOGRAM  11/27/2024   Colonoscopy  10/13/2028   DTaP/Tdap/Td (4 - Td or Tdap) 09/27/2029   Pneumonia Vaccine 62+ Years old  Completed   INFLUENZA VACCINE  Completed   DEXA SCAN  Completed   Hepatitis C Screening  Completed   Zoster Vaccines- Shingrix  Completed   HPV VACCINES  Aged Out    Discussed health benefits of physical activity, and encouraged her to engage in regular exercise appropriate for her age and condition.  Problem List Items Addressed This Visit       Endocrine   Subclinical hypothyroidism    F/b ENT On synthroid 50 mcg daily - will continue Repeat TSH today      Relevant Orders   TSH + free T4     Other   HLD (hyperlipidemia)    Reviewed last lipid panel Not currently on a statin Recheck FLP and CMP Discussed diet and exercise       Relevant Orders   Lipid Panel With LDL/HDL Ratio   Comprehensive metabolic panel   Avitaminosis D    Continue supplement Recheck level       Relevant Orders   VITAMIN D 25 Hydroxy (Vit-D Deficiency, Fractures)   Obesity    Discussed importance of healthy weight management Discussed diet and exercise       Other Visit Diagnoses     Encounter for annual physical exam    -  Primary   Relevant Orders   Lipid Panel With LDL/HDL Ratio   VITAMIN D 25 Hydroxy (Vit-D Deficiency, Fractures)    Comprehensive metabolic panel   MM 3D SCREENING MAMMOGRAM BILATERAL BREAST   TSH + free T4   Hemoglobin A1c   Immunization due       Relevant Orders   Flu Vaccine Trivalent  High Dose (Fluad) (Completed)   Influenza vaccine needed       Relevant Orders   Flu Vaccine Trivalent High Dose (Fluad) (Completed)   Breast cancer screening by mammogram       Relevant Orders   MM 3D SCREENING MAMMOGRAM BILATERAL BREAST   Hyperglycemia       Relevant Orders   Hemoglobin A1c        Return in about 1 year (around 11/05/2024) for CPE.     Shirlee Latch, MD

## 2023-11-06 NOTE — Assessment & Plan Note (Signed)
Discussed importance of healthy weight management Discussed diet and exercise  

## 2023-11-06 NOTE — Assessment & Plan Note (Signed)
Reviewed last lipid panel Not currently on a statin Recheck FLP and CMP Discussed diet and exercise  

## 2023-11-06 NOTE — Assessment & Plan Note (Signed)
Continue supplement Recheck level 

## 2023-11-13 LAB — COMPREHENSIVE METABOLIC PANEL
ALT: 25 [IU]/L (ref 0–32)
AST: 16 [IU]/L (ref 0–40)
Albumin: 4.4 g/dL (ref 3.9–4.9)
Alkaline Phosphatase: 102 [IU]/L (ref 44–121)
BUN/Creatinine Ratio: 15 (ref 12–28)
BUN: 9 mg/dL (ref 8–27)
Bilirubin Total: 0.6 mg/dL (ref 0.0–1.2)
CO2: 30 mmol/L — ABNORMAL HIGH (ref 20–29)
Calcium: 9.6 mg/dL (ref 8.7–10.3)
Chloride: 100 mmol/L (ref 96–106)
Creatinine, Ser: 0.62 mg/dL (ref 0.57–1.00)
Globulin, Total: 2.3 g/dL (ref 1.5–4.5)
Glucose: 91 mg/dL (ref 70–99)
Potassium: 4.5 mmol/L (ref 3.5–5.2)
Sodium: 141 mmol/L (ref 134–144)
Total Protein: 6.7 g/dL (ref 6.0–8.5)
eGFR: 98 mL/min/{1.73_m2} (ref >59)

## 2023-11-13 LAB — TSH+FREE T4
Free T4: 1.4 ng/dL (ref 0.82–1.77)
TSH: 3.95 u[IU]/mL (ref 0.450–4.500)

## 2023-11-13 LAB — LIPID PANEL WITH LDL/HDL RATIO
Cholesterol, Total: 208 mg/dL — ABNORMAL HIGH (ref 100–199)
HDL: 55 mg/dL (ref >39)
LDL Chol Calc (NIH): 129 mg/dL — ABNORMAL HIGH (ref 0–99)
LDL/HDL Ratio: 2.3 ratio (ref 0.0–3.2)
Triglycerides: 138 mg/dL (ref 0–149)
VLDL Cholesterol Cal: 24 mg/dL (ref 5–40)

## 2023-11-13 LAB — HEMOGLOBIN A1C
Est. average glucose Bld gHb Est-mCnc: 108 mg/dL
Hgb A1c MFr Bld: 5.4 % (ref 4.8–5.6)

## 2023-11-13 LAB — VITAMIN D 25 HYDROXY (VIT D DEFICIENCY, FRACTURES): Vit D, 25-Hydroxy: 43.7 ng/mL (ref 30.0–100.0)

## 2023-12-25 ENCOUNTER — Ambulatory Visit
Admission: RE | Admit: 2023-12-25 | Discharge: 2023-12-25 | Disposition: A | Discharge status: Home / Self Care | Payer: 59 | Source: Ambulatory Visit | Attending: Family Medicine | Admitting: Family Medicine

## 2023-12-25 DIAGNOSIS — Z1231 Encounter for screening mammogram for malignant neoplasm of breast: Secondary | ICD-10-CM | POA: Diagnosis present

## 2023-12-25 DIAGNOSIS — Z0000 Encounter for general adult medical examination without abnormal findings: Secondary | ICD-10-CM

## 2024-07-13 DIAGNOSIS — E038 Other specified hypothyroidism: Secondary | ICD-10-CM | POA: Diagnosis not present

## 2024-07-13 DIAGNOSIS — C8590 Non-Hodgkin lymphoma, unspecified, unspecified site: Secondary | ICD-10-CM | POA: Diagnosis not present

## 2024-07-13 DIAGNOSIS — Z8572 Personal history of non-Hodgkin lymphomas: Secondary | ICD-10-CM | POA: Diagnosis not present

## 2024-08-19 ENCOUNTER — Encounter: Payer: Self-pay | Admitting: Family Medicine

## 2024-08-19 ENCOUNTER — Ambulatory Visit (INDEPENDENT_AMBULATORY_CARE_PROVIDER_SITE_OTHER): Admitting: Family Medicine

## 2024-08-19 VITALS — BP 102/83 | HR 100 | Temp 99.0°F | Ht 62.0 in | Wt 187.0 lb

## 2024-08-19 DIAGNOSIS — J40 Bronchitis, not specified as acute or chronic: Secondary | ICD-10-CM | POA: Diagnosis not present

## 2024-08-19 DIAGNOSIS — R55 Syncope and collapse: Secondary | ICD-10-CM | POA: Diagnosis not present

## 2024-08-19 DIAGNOSIS — R079 Chest pain, unspecified: Secondary | ICD-10-CM | POA: Diagnosis not present

## 2024-08-19 MED ORDER — DOXYCYCLINE HYCLATE 100 MG PO TABS: 100.0000 mg | ORAL_TABLET | Freq: Two times a day (BID) | ORAL | 0 refills | Status: AC

## 2024-08-19 NOTE — Progress Notes (Signed)
 Acute visit   Patient: Victoria Benson   DOB: 1956/08/01   68 y.o. Female  MRN: [id number] PCP: Myrla Jon CHRISTELLA, MD   Chief Complaint  Patient presents with   Nasal Congestion    Coughing, chest congestions, ears popping this has been going on for about 10 days    Subjective    Discussed the use of AI scribe software for clinical note transcription with the patient, who gave verbal consent to proceed.  History of Present Illness   Victoria Benson is a 68 year old female who presents with a persistent cough, nasal congestion, and sore throat for ten days.  She experiences a persistent cough, nasal congestion, and sore throat for ten days. Her ears feel 'closed,' causing difficulty hearing. She uses Expectra without relief. She has not tested positive for COVID-19, and no one in her travel group has either.  During a recent trip to [place], she was exposed to a respiratory illness on a river cruise from [place] to [place]. Many people on the ship were sick, and some required medical attention. Her husband, Oneil, also has similar symptoms and is seeking medical care.  She recalls right-sided chest pain in [place], similar to an episode in April of the previous year, during which she passed out. She did not experience heart palpitations or arrhythmias before passing out. A previous workup, including a Zio patch, showed some SVT runs but no sustained arrhythmias.       Review of Systems   Objective    BP 102/83   Pulse 100   Temp 99 F (37.2 C)   Ht 5' 2 (1.575 m)   Wt 187 lb (84.8 kg)   SpO2 97%   BMI 34.20 kg/m   Physical Exam Vitals reviewed.  Constitutional:      General: She is not in acute distress.    Appearance: Normal appearance. She is well-developed. She is not diaphoretic.  HENT:     Head: Normocephalic and atraumatic.     Right Ear: Tympanic membrane, ear canal and external ear normal.     Left Ear: Tympanic membrane, ear canal and external  ear normal.     Nose: Nose normal.     Mouth/Throat:     Mouth: Mucous membranes are moist.     Pharynx: Oropharynx is clear. No oropharyngeal exudate.  Eyes:     General: No scleral icterus.    Conjunctiva/sclera: Conjunctivae normal.     Pupils: Pupils are equal, round, and reactive to light.  Cardiovascular:     Rate and Rhythm: Normal rate and regular rhythm.     Heart sounds: Normal heart sounds. No murmur heard. Pulmonary:     Effort: Pulmonary effort is normal. No respiratory distress.     Breath sounds: Rhonchi (bilateral bases) present. No wheezing or rales.  Musculoskeletal:     Cervical back: Neck supple.     Right lower leg: No edema.     Left lower leg: No edema.  Lymphadenopathy:     Cervical: No cervical adenopathy.  Skin:    General: Skin is warm and dry.     Findings: No rash.  Neurological:     Mental Status: She is alert.       No results found for any visits on 08/19/24.  Assessment & Plan     Problem List Items Addressed This Visit   None Visit Diagnoses       Bronchitis    -  Primary     Syncope, unspecified syncope type         Right-sided chest pain               Acute lower respiratory tract infection Acute lower respiratory tract infection with symptoms of cough, nasal congestion, ear fullness, and sore throat for 10 days. Lungs auscultated with significant mucus, popping, and crackling sounds. Likely contracted during recent travel in [place]. Differential includes various respiratory infections, but COVID-19 and flu are unlikely due to the time course and lack of positive tests among contacts. Decision made to treat with antibiotics due to the duration and severity of symptoms. Doxycycline  chosen for its broad coverage, including atypical pathogens. - Prescribe doxycycline  100 mg twice daily for 7 days. - Advise taking doxycycline  with yogurt and food to prevent gastrointestinal upset. - Counsel on potential sun sensitivity with  doxycycline ; advise use of sunscreen. - Recommend honey for cough relief, including honey cough drops or honey in coffee. - Advise monitoring for shortness of breath, especially if unable to complete sentences without pausing for breath. Seek care if this occurs.  Syncope with right-sided chest pain Recurrent right-sided chest pain with syncope episode in [place]. Previous cardiology workup in 2022 showed non-sustained SVT, normal echo, and EKG. Current episode possibly related to recent respiratory infection, dehydration, or vasovagal syncope. Consideration of musculoskeletal pain or scar tissue from previous lumpectomy as contributing factors. No immediate recurrence since the episode in [place], which is reassuring. - Consider cardiology referral if symptoms recur.  Follow-Up Follow-up appointment scheduled for November for regular check-up. Advised to monitor symptoms and report if not improving with current treatment. - Follow up in November for regular check-up. - Contact provider if symptoms do not improve with doxycycline  treatment.       Meds ordered this encounter  Medications   doxycycline  (VIBRA -TABS) 100 MG tablet    Sig: Take 1 tablet (100 mg total) by mouth 2 (two) times daily for 7 days.    Dispense:  14 tablet    Refill:  0     Return if symptoms worsen or fail to improve.      Jon Eva, MD  [facility] [phone] (phone) [phone] (fax)  [facility]

## 2024-08-25 DIAGNOSIS — L570 Actinic keratosis: Secondary | ICD-10-CM | POA: Diagnosis not present

## 2024-08-25 DIAGNOSIS — D2271 Melanocytic nevi of right lower limb, including hip: Secondary | ICD-10-CM | POA: Diagnosis not present

## 2024-08-25 DIAGNOSIS — D2262 Melanocytic nevi of left upper limb, including shoulder: Secondary | ICD-10-CM | POA: Diagnosis not present

## 2024-08-25 DIAGNOSIS — Z08 Encounter for follow-up examination after completed treatment for malignant neoplasm: Secondary | ICD-10-CM | POA: Diagnosis not present

## 2024-08-25 DIAGNOSIS — D225 Melanocytic nevi of trunk: Secondary | ICD-10-CM | POA: Diagnosis not present

## 2024-08-25 DIAGNOSIS — D2272 Melanocytic nevi of left lower limb, including hip: Secondary | ICD-10-CM | POA: Diagnosis not present

## 2024-08-25 DIAGNOSIS — D2261 Melanocytic nevi of right upper limb, including shoulder: Secondary | ICD-10-CM | POA: Diagnosis not present

## 2024-08-25 DIAGNOSIS — L814 Other melanin hyperpigmentation: Secondary | ICD-10-CM | POA: Diagnosis not present

## 2024-08-25 DIAGNOSIS — Z85828 Personal history of other malignant neoplasm of skin: Secondary | ICD-10-CM | POA: Diagnosis not present

## 2024-11-08 ENCOUNTER — Encounter: Payer: Self-pay | Admitting: Family Medicine
